# Patient Record
Sex: Female | Born: 1935 | Race: White | Hispanic: No | Smoking: Never smoker
Health system: Southern US, Community
[De-identification: ages and names within clinical notes are randomized; demographics above are authoritative.]

## PROBLEM LIST (undated history)

## (undated) DIAGNOSIS — E785 Hyperlipidemia, unspecified: Secondary | ICD-10-CM

## (undated) DIAGNOSIS — F329 Major depressive disorder, single episode, unspecified: Secondary | ICD-10-CM

## (undated) DIAGNOSIS — C20 Malignant neoplasm of rectum: Secondary | ICD-10-CM

## (undated) DIAGNOSIS — M069 Rheumatoid arthritis, unspecified: Secondary | ICD-10-CM

## (undated) DIAGNOSIS — I2510 Atherosclerotic heart disease of native coronary artery without angina pectoris: Secondary | ICD-10-CM

## (undated) DIAGNOSIS — I340 Nonrheumatic mitral (valve) insufficiency: Secondary | ICD-10-CM

## (undated) DIAGNOSIS — J849 Interstitial pulmonary disease, unspecified: Secondary | ICD-10-CM

## (undated) DIAGNOSIS — I350 Nonrheumatic aortic (valve) stenosis: Secondary | ICD-10-CM

## (undated) DIAGNOSIS — R609 Edema, unspecified: Secondary | ICD-10-CM

## (undated) DIAGNOSIS — H3530 Unspecified macular degeneration: Secondary | ICD-10-CM

## (undated) DIAGNOSIS — D649 Anemia, unspecified: Secondary | ICD-10-CM

## (undated) DIAGNOSIS — I779 Disorder of arteries and arterioles, unspecified: Secondary | ICD-10-CM

## (undated) DIAGNOSIS — Z923 Personal history of irradiation: Secondary | ICD-10-CM

## (undated) DIAGNOSIS — I509 Heart failure, unspecified: Secondary | ICD-10-CM

## (undated) DIAGNOSIS — K625 Hemorrhage of anus and rectum: Secondary | ICD-10-CM

## (undated) DIAGNOSIS — K219 Gastro-esophageal reflux disease without esophagitis: Secondary | ICD-10-CM

## (undated) DIAGNOSIS — I10 Essential (primary) hypertension: Secondary | ICD-10-CM

## (undated) DIAGNOSIS — Z79899 Other long term (current) drug therapy: Secondary | ICD-10-CM

## (undated) DIAGNOSIS — I8290 Acute embolism and thrombosis of unspecified vein: Secondary | ICD-10-CM

## (undated) DIAGNOSIS — F32A Depression, unspecified: Secondary | ICD-10-CM

## (undated) DIAGNOSIS — H269 Unspecified cataract: Secondary | ICD-10-CM

## (undated) DIAGNOSIS — R9430 Abnormal result of cardiovascular function study, unspecified: Secondary | ICD-10-CM

## (undated) DIAGNOSIS — I358 Other nonrheumatic aortic valve disorders: Secondary | ICD-10-CM

## (undated) DIAGNOSIS — S3730XA Unspecified injury of urethra, initial encounter: Secondary | ICD-10-CM

## (undated) DIAGNOSIS — I739 Peripheral vascular disease, unspecified: Secondary | ICD-10-CM

## (undated) DIAGNOSIS — C801 Malignant (primary) neoplasm, unspecified: Secondary | ICD-10-CM

## (undated) DIAGNOSIS — IMO0002 Reserved for concepts with insufficient information to code with codable children: Secondary | ICD-10-CM

## (undated) HISTORY — DX: Depression, unspecified: F32.A

## (undated) HISTORY — DX: Atherosclerotic heart disease of native coronary artery without angina pectoris: I25.10

## (undated) HISTORY — DX: Malignant neoplasm of rectum: C20

## (undated) HISTORY — PX: TONSILLECTOMY: SUR1361

## (undated) HISTORY — DX: Gastro-esophageal reflux disease without esophagitis: K21.9

## (undated) HISTORY — DX: Nonrheumatic aortic (valve) stenosis: I35.0

## (undated) HISTORY — DX: Disorder of arteries and arterioles, unspecified: I77.9

## (undated) HISTORY — DX: Heart failure, unspecified: I50.9

## (undated) HISTORY — DX: Unspecified cataract: H26.9

## (undated) HISTORY — DX: Essential (primary) hypertension: I10

## (undated) HISTORY — DX: Acute embolism and thrombosis of unspecified vein: I82.90

## (undated) HISTORY — DX: Hyperlipidemia, unspecified: E78.5

## (undated) HISTORY — DX: Nonrheumatic mitral (valve) insufficiency: I34.0

## (undated) HISTORY — DX: Other nonrheumatic aortic valve disorders: I35.8

## (undated) HISTORY — DX: Unspecified injury of urethra, initial encounter: S37.30XA

## (undated) HISTORY — PX: APPENDECTOMY: SHX54

## (undated) HISTORY — DX: Other long term (current) drug therapy: Z79.899

## (undated) HISTORY — DX: Reserved for concepts with insufficient information to code with codable children: IMO0002

## (undated) HISTORY — PX: BREAST SURGERY: SHX581

## (undated) HISTORY — DX: Major depressive disorder, single episode, unspecified: F32.9

## (undated) HISTORY — DX: Interstitial pulmonary disease, unspecified: J84.9

## (undated) HISTORY — DX: Rheumatoid arthritis, unspecified: M06.9

## (undated) HISTORY — DX: Abnormal result of cardiovascular function study, unspecified: R94.30

## (undated) HISTORY — DX: Peripheral vascular disease, unspecified: I73.9

## (undated) HISTORY — DX: Edema, unspecified: R60.9

---

## 1997-10-01 ENCOUNTER — Ambulatory Visit (HOSPITAL_COMMUNITY): Admission: RE | Admit: 1997-10-01 | Discharge: 1997-10-01 | Payer: Self-pay

## 1998-10-05 ENCOUNTER — Emergency Department (HOSPITAL_COMMUNITY): Admission: EM | Admit: 1998-10-05 | Discharge: 1998-10-05 | Payer: Self-pay | Admitting: Emergency Medicine

## 1998-10-05 ENCOUNTER — Encounter: Payer: Self-pay | Admitting: Emergency Medicine

## 1999-07-25 ENCOUNTER — Inpatient Hospital Stay (HOSPITAL_COMMUNITY): Admission: EM | Admit: 1999-07-25 | Discharge: 1999-07-30 | Payer: Self-pay | Admitting: Emergency Medicine

## 1999-07-25 ENCOUNTER — Encounter: Payer: Self-pay | Admitting: Emergency Medicine

## 1999-07-26 ENCOUNTER — Encounter: Payer: Self-pay | Admitting: Family Medicine

## 1999-07-28 ENCOUNTER — Encounter: Payer: Self-pay | Admitting: Family Medicine

## 1999-07-30 ENCOUNTER — Encounter: Payer: Self-pay | Admitting: Family Medicine

## 1999-08-04 ENCOUNTER — Encounter: Admission: RE | Admit: 1999-08-04 | Discharge: 1999-08-04 | Payer: Self-pay | Admitting: Family Medicine

## 2001-02-08 HISTORY — PX: CORONARY ANGIOPLASTY WITH STENT PLACEMENT: SHX49

## 2001-04-13 ENCOUNTER — Inpatient Hospital Stay (HOSPITAL_COMMUNITY): Admission: EM | Admit: 2001-04-13 | Discharge: 2001-04-24 | Payer: Self-pay | Admitting: *Deleted

## 2001-04-13 ENCOUNTER — Encounter: Payer: Self-pay | Admitting: *Deleted

## 2001-04-14 ENCOUNTER — Encounter: Payer: Self-pay | Admitting: Internal Medicine

## 2001-04-16 ENCOUNTER — Encounter: Payer: Self-pay | Admitting: Internal Medicine

## 2001-07-19 ENCOUNTER — Encounter: Payer: Self-pay | Admitting: Rheumatology

## 2001-07-19 ENCOUNTER — Encounter: Admission: RE | Admit: 2001-07-19 | Discharge: 2001-07-19 | Payer: Self-pay | Admitting: Rheumatology

## 2001-09-10 ENCOUNTER — Encounter (INDEPENDENT_AMBULATORY_CARE_PROVIDER_SITE_OTHER): Payer: Self-pay | Admitting: Specialist

## 2001-09-10 ENCOUNTER — Encounter: Payer: Self-pay | Admitting: Emergency Medicine

## 2001-09-11 ENCOUNTER — Inpatient Hospital Stay (HOSPITAL_COMMUNITY): Admission: EM | Admit: 2001-09-11 | Discharge: 2001-09-14 | Payer: Self-pay | Admitting: Emergency Medicine

## 2001-09-11 ENCOUNTER — Encounter: Payer: Self-pay | Admitting: Internal Medicine

## 2001-09-11 ENCOUNTER — Encounter: Payer: Self-pay | Admitting: Emergency Medicine

## 2002-12-10 ENCOUNTER — Encounter: Admission: RE | Admit: 2002-12-10 | Discharge: 2002-12-10 | Payer: Self-pay | Admitting: Rheumatology

## 2004-02-04 ENCOUNTER — Inpatient Hospital Stay (HOSPITAL_COMMUNITY): Admission: EM | Admit: 2004-02-04 | Discharge: 2004-02-07 | Payer: Self-pay | Admitting: Emergency Medicine

## 2004-02-04 ENCOUNTER — Ambulatory Visit: Payer: Self-pay | Admitting: Family Medicine

## 2004-07-24 ENCOUNTER — Ambulatory Visit: Payer: Self-pay | Admitting: Cardiology

## 2004-10-05 ENCOUNTER — Ambulatory Visit: Payer: Self-pay | Admitting: Cardiology

## 2005-04-26 ENCOUNTER — Ambulatory Visit: Payer: Self-pay | Admitting: Cardiology

## 2005-10-05 ENCOUNTER — Ambulatory Visit: Payer: Self-pay | Admitting: Cardiology

## 2006-01-19 ENCOUNTER — Ambulatory Visit: Payer: Self-pay | Admitting: Cardiology

## 2006-01-19 LAB — CONVERTED CEMR LAB
ALT: 8 units/L (ref 0–40)
AST: 18 units/L (ref 0–37)
Albumin: 3.8 g/dL (ref 3.5–5.2)
Alkaline Phosphatase: 54 units/L (ref 39–117)
Bilirubin, Direct: 0.1 mg/dL (ref 0.0–0.3)
Chol/HDL Ratio, serum: 3.1
Cholesterol: 138 mg/dL (ref 0–200)
HDL: 44.5 mg/dL (ref >39.0)
LDL Cholesterol: 70 mg/dL (ref 0–99)
Total Bilirubin: 0.6 mg/dL (ref 0.3–1.2)
Total Protein: 6.3 g/dL (ref 6.0–8.3)
Triglyceride fasting, serum: 116 mg/dL (ref 0–149)
VLDL: 23 mg/dL (ref 0–40)

## 2006-10-03 ENCOUNTER — Ambulatory Visit: Payer: Self-pay | Admitting: Cardiology

## 2006-10-03 LAB — CONVERTED CEMR LAB
ALT: 5 units/L (ref 0–35)
AST: 17 units/L (ref 0–37)
Albumin: 3.7 g/dL (ref 3.5–5.2)
Alkaline Phosphatase: 67 units/L (ref 39–117)
Bilirubin, Direct: 0.1 mg/dL (ref 0.0–0.3)
Cholesterol: 145 mg/dL (ref 0–200)
HDL: 36.6 mg/dL — ABNORMAL LOW (ref >39.0)
LDL Cholesterol: 81 mg/dL (ref 0–99)
Total Bilirubin: 0.7 mg/dL (ref 0.3–1.2)
Total CHOL/HDL Ratio: 4
Total Protein: 6.1 g/dL (ref 6.0–8.3)
Triglycerides: 138 mg/dL (ref 0–149)
VLDL: 28 mg/dL (ref 0–40)

## 2006-10-12 ENCOUNTER — Ambulatory Visit: Payer: Self-pay | Admitting: Cardiology

## 2007-11-01 ENCOUNTER — Ambulatory Visit: Payer: Self-pay | Admitting: Cardiology

## 2007-11-22 ENCOUNTER — Encounter: Payer: Self-pay | Admitting: Cardiology

## 2007-11-22 ENCOUNTER — Ambulatory Visit: Payer: Self-pay | Admitting: Cardiology

## 2007-11-22 ENCOUNTER — Ambulatory Visit: Payer: Self-pay

## 2008-08-28 ENCOUNTER — Telehealth: Payer: Self-pay | Admitting: Cardiology

## 2008-10-17 ENCOUNTER — Ambulatory Visit: Payer: Self-pay | Admitting: Cardiology

## 2008-10-26 ENCOUNTER — Encounter: Payer: Self-pay | Admitting: Cardiology

## 2008-10-26 DIAGNOSIS — I10 Essential (primary) hypertension: Secondary | ICD-10-CM | POA: Insufficient documentation

## 2008-10-28 ENCOUNTER — Ambulatory Visit: Payer: Self-pay | Admitting: Cardiology

## 2008-10-28 LAB — CONVERTED CEMR LAB
ALT: 8 units/L (ref 0–35)
AST: 16 units/L (ref 0–37)
Albumin: 3.8 g/dL (ref 3.5–5.2)
Alkaline Phosphatase: 56 units/L (ref 39–117)
Bilirubin, Direct: 0.1 mg/dL (ref 0.0–0.3)
Cholesterol: 159 mg/dL (ref 0–200)
HDL: 62.3 mg/dL (ref >39.00)
LDL Cholesterol: 81 mg/dL (ref 0–99)
Total Bilirubin: 0.8 mg/dL (ref 0.3–1.2)
Total CHOL/HDL Ratio: 3
Total Protein: 6.4 g/dL (ref 6.0–8.3)
Triglycerides: 80 mg/dL (ref 0.0–149.0)
VLDL: 16 mg/dL (ref 0.0–40.0)

## 2008-11-04 ENCOUNTER — Encounter: Payer: Self-pay | Admitting: Cardiology

## 2009-03-16 ENCOUNTER — Emergency Department (HOSPITAL_COMMUNITY): Admission: EM | Admit: 2009-03-16 | Discharge: 2009-03-16 | Payer: Self-pay | Admitting: Emergency Medicine

## 2009-11-03 ENCOUNTER — Telehealth: Payer: Self-pay | Admitting: Cardiology

## 2009-11-17 ENCOUNTER — Ambulatory Visit: Payer: Self-pay | Admitting: Cardiology

## 2009-11-20 ENCOUNTER — Encounter: Payer: Self-pay | Admitting: Cardiology

## 2009-11-20 ENCOUNTER — Ambulatory Visit: Payer: Self-pay | Admitting: Cardiology

## 2009-11-20 LAB — CONVERTED CEMR LAB
ALT: 6 units/L (ref 0–35)
AST: 20 units/L (ref 0–37)
Albumin: 3.5 g/dL (ref 3.5–5.2)
Alkaline Phosphatase: 64 units/L (ref 39–117)
Bilirubin, Direct: 0.1 mg/dL (ref 0.0–0.3)
Cholesterol: 209 mg/dL — ABNORMAL HIGH (ref 0–200)
Direct LDL: 126.6 mg/dL
HDL: 41.7 mg/dL (ref >39.00)
Total Bilirubin: 0.4 mg/dL (ref 0.3–1.2)
Total CHOL/HDL Ratio: 5
Total Protein: 5.9 g/dL — ABNORMAL LOW (ref 6.0–8.3)
Triglycerides: 184 mg/dL — ABNORMAL HIGH (ref 0.0–149.0)
VLDL: 36.8 mg/dL (ref 0.0–40.0)

## 2009-12-24 ENCOUNTER — Ambulatory Visit: Payer: Self-pay

## 2009-12-24 ENCOUNTER — Ambulatory Visit: Payer: Self-pay | Admitting: Cardiology

## 2009-12-31 LAB — CONVERTED CEMR LAB
ALT: 10 units/L (ref 0–35)
AST: 23 units/L (ref 0–37)
Albumin: 3.9 g/dL (ref 3.5–5.2)
Alkaline Phosphatase: 63 units/L (ref 39–117)
Bilirubin, Direct: 0.1 mg/dL (ref 0.0–0.3)
Cholesterol: 166 mg/dL (ref 0–200)
HDL: 45 mg/dL (ref >39.00)
LDL Cholesterol: 95 mg/dL (ref 0–99)
Total Bilirubin: 0.4 mg/dL (ref 0.3–1.2)
Total CHOL/HDL Ratio: 4
Total Protein: 6.1 g/dL (ref 6.0–8.3)
Triglycerides: 130 mg/dL (ref 0.0–149.0)
VLDL: 26 mg/dL (ref 0.0–40.0)

## 2010-03-12 NOTE — Assessment & Plan Note (Signed)
Summary: rov. gd      Allergies Added: NKDA  Visit Type:  Follow-up Primary Carman Essick:  none  CC:  CAD.  History of Present Illness: The patient is seen for followup of coronary artery disease and dyslipidemia and aortic valve sclerosis..  I saw her last September, 2010.  She has disfiguring rheumatoid arthritis.  At this time she does not have a primary physician.  I am strongly encouraging her to try to find one.  She had her lipids checked recently.  Her LDL is 126.  She admits to me today that she has not been taking her Lipitor.  She's not having any significant chest pain or shortness of breath.  Current Medications (verified): 1)  Metoprolol Succinate 100 Mg Xr24h-Tab (Metoprolol Succinate) .... Take One Tablet By Mouth Daily 2)  Lipitor 10 Mg Tabs (Atorvastatin Calcium) .... Take One Tablet By Mouth Daily. 3)  Amitriptyline Hcl 50 Mg Tabs (Amitriptyline Hcl) .... Take One Tablet By Mouth Once Daily. 4)  Nexium 40 Mg Cpdr (Esomeprazole Magnesium) .... Take One Tablet By Mouth Once Daily. 5)  Arava 20 Mg Tabs (Leflunomide) .... Take One Tablet By Mouth Once Daily. 6)  Prednisone 5 Mg Tabs (Prednisone) .... Take One Tablet By Mouth Once Daily. 7)  Tylenol Extra Strength 500 Mg Tabs (Acetaminophen) .... As Needed 8)  Aspirin Ec 325 Mg Tbec (Aspirin) .... Take One Tablet By Mouth Daily 9)  Calcium Carbonate-Vitamin D 600-400 Mg-Unit  Tabs (Calcium Carbonate-Vitamin D) .... Take One Tablet By Mouth Once Daily. 10)  Eye-Vites  Tabs (Multiple Vitamins-Minerals) .... Take One Tablet By Mouth Once Daily.  Allergies (verified): No Known Drug Allergies  Past History:  Past Medical History: CAD... two-vessel intervention 2003 Dyslipidemia LV function.... normal..EF 60%... echo..October 2009 CHF.... single episode... in the past Mitral regurgitation mild..mild prolapse anterior and posterior leaflets.. echo.. October 2009 Aortic valve sclerosis.... mild...moderate calcification.. echo..  October 2009 Apical clot... question in the past... no longer an issue Hypertension... difficult to obtain blood pressure at times Urethral trauma.... some bleeding in hospital Rheumatoid arthritis.... severe... deforming Lung disease..... interstitial.... related to rheumatoid arthritis.[name] Kitchen/  also question left apical nodule followed elsewhere..... my understanding stabilized Prednisone therapy Carotid bruit  right.... October, 2011  Review of Systems       Patient denies fever, chills, headache, sweats, rash, change in vision, change in hearing, chest pain, cough, nausea vomiting, urinary symptoms.  She does admit to some indigestion.  She says that she was seen in the emergency room and that she has a significant hiatal hernia.  All other systems are reviewed and are negative.  Vital Signs:  Patient profile:   77 year old female Height:      64 inches Weight:      114 pounds BMI:     19.64 Pulse rate:   90 / minute BP sitting:   102 / 77  (right arm) Cuff size:   regular  Vitals Entered By: Hardin Negus, RMA (November 20, 2009 10:00 AM)  Physical Exam  General:  The patient is frail but energetic. Head:  head is atraumatic. Eyes:  no xanthelasma. Neck:  no jugular venous distention.  There is a sound in her right neck which could be either a bruit or a radiated murmur. Chest Wall:  no chest wall tenderness. Lungs:  lungs are clear.  Respiratory effort is nonlabored. Heart:  cardiac exam reveals S1-S2.  There is a crescendo decrescendo systolic murmur. Abdomen:  abdomen is soft. Msk:  the patient has severe deforming rheumatoid arthritis affecting her hands. Extremities:  no peripheral edema. Skin:  no skin rashes. Psych:  patient is oriented to person time and place.  Affect is normal.   Impression & Recommendations:  Problem # 1:  CAROTID BRUIT (ICD-785.9)  The patient has a right carotid bruit.  It is possible that this could be a radiated murmur.  We need carotid  Doppler to be sure this will be arranged and I'll be in touch with her.  Orders: Carotid Duplex (Carotid Duplex)  Problem # 2:  RHEUMATOID ARTHRITIS (ICD-714.0) The patient's rheumatoid arthritis is severe.  She is not having a lot of pain at this time.  Problem # 3:  HYPERTENSION (ICD-401.9)  Her updated medication list for this problem includes:    Metoprolol Succinate 100 Mg Xr24h-tab (Metoprolol succinate) .[name] Kitchen... Take one tablet by mouth daily    Aspirin Ec 325 Mg Tbec (Aspirin) .[name] Kitchen... Take one tablet by mouth daily Blood pressure is controlled.  No change in therapy.  Problem # 4:  DYSLIPIDEMIA (ICD-272.4)  Her updated medication list for this problem includes:    Lipitor 10 Mg Tabs (Atorvastatin calcium) .[name] Kitchen... Take one tablet by mouth daily. The patient's lipids are not adequately treated.  She admitted today that she has not been taking her Lipitor.  She will restart it and we'll arrange for followup labs.  Problem # 5:  CAD (ICD-414.00)  Her updated medication list for this problem includes:    Metoprolol Succinate 100 Mg Xr24h-tab (Metoprolol succinate) .[name] Kitchen... Take one tablet by mouth daily    Aspirin Ec 325 Mg Tbec (Aspirin) .[name] Kitchen... Take one tablet by mouth daily  Orders: EKG w/ Interpretation (93000) Coronary disease is stable.  EKG is done today and reviewed by me.  There is normal sinus rhythm with nonspecific ST-T wave changes.  No further workup is needed.  Patient Instructions: 1)  Take your Lipitor every day 2)  Your physician has requested that you have a carotid duplex. This test is an ultrasound of the carotid arteries in your neck. It looks at blood flow through these arteries that supply the brain with blood. Allow one hour for this exam. There are no restrictions or special instructions. 3)  Your physician recommends that you return for a FASTING lipid and liver profile: in 6 weeks (272.2) 4)  When you get a primary care MD, let us know so we can send them your  records 5)  Your physician wants you to follow-up in:  1 year.  You will receive a reminder letter in the mail two months in advance. If you don't receive a letter, please call our office to schedule the follow-up appointment. Prescriptions: LIPITOR 10 MG TABS (ATORVASTATIN CALCIUM) Take one tablet by mouth daily.  #90 x 3   Entered by:   Meredith Staggers, RN   Authorized by:   Talitha Givens, MD, [facility]   Signed by:   Meredith Staggers, RN on 11/20/2009   Method used:   Electronically to        [facility]* (retail)       [address] [place], [place]  [address]       Ph: [phone] or [phone]       Fax: [phone]   RxID:   [id number] NEXIUM 40 MG CPDR (ESOMEPRAZOLE MAGNESIUM) Take one tablet by mouth once  daily.  #30 x 3   Entered by:   Meredith Staggers, RN   Authorized by:   Talitha Givens, MD, [facility]   Signed by:   Meredith Staggers, RN on 11/20/2009   Method used:   Electronically to        [facility]* (retail)       [address] [place], [place]  [address]       Ph: [phone] or [phone]       Fax: [phone]   RxID:   [id number] AMITRIPTYLINE HCL 50 MG TABS (AMITRIPTYLINE HCL) Take one tablet by mouth once daily.  #90 x 3   Entered by:   Meredith Staggers, RN   Authorized by:   Talitha Givens, MD, [facility]   Signed by:   Meredith Staggers, RN on 11/20/2009   Method used:   Electronically to        [facility]* (retail)       [address] [place], [place]  [address]       Ph: [phone] or [phone]       Fax: [phone]   RxID:   [id number] METOPROLOL SUCCINATE 100 MG XR24H-TAB (METOPROLOL SUCCINATE) Take one tablet by mouth daily  #90 x 3   Entered by:   Meredith Staggers, RN   Authorized by:   Talitha Givens, MD, [facility]   Signed by:   Meredith Staggers, RN on  11/20/2009   Method used:   Electronically to        [facility]* (retail)       [address] [place], [place]  [address]       Ph: [phone] or [phone]       Fax: [phone]   RxID:   [id number]

## 2010-03-12 NOTE — Progress Notes (Signed)
Summary: lab work prior to appt   Phone Note Call from Patient Call back at [facility] Phone [phone]   Caller: Patient Reason for Call: Talk to Nurse, Lab or Test Results Summary of Call: would like to have lab work prior to appt  Initial call taken by: Lorne Skeens,  November 03, 2009 12:20 PM  Follow-up for Phone Call        labs sch for 10/10 Meredith Staggers, RN  November 03, 2009 5:04 PM

## 2010-03-23 ENCOUNTER — Encounter: Payer: Self-pay | Admitting: Cardiology

## 2010-04-01 NOTE — Letter (Signed)
Summary: Lipid reminder   [facility] Main [facility]  [address]   [place], [place] [address]   Phone: [phone]  Fax: [phone]        March 23, 2010 MRN: [id number]    [name] [address] [place], [place]  [address]    Dear Ms. Massoud,  Our records indicate it is time to check your cholesterol.  Please call our office to schedule an appt for labwork.  Please remember it is a fasting lab.     Sincerely,  Meredith Staggers, RN Willa Rough, MD This letter has been electronically signed by your physician.

## 2010-04-06 ENCOUNTER — Other Ambulatory Visit (INDEPENDENT_AMBULATORY_CARE_PROVIDER_SITE_OTHER): Payer: Medicare Other

## 2010-04-06 ENCOUNTER — Other Ambulatory Visit: Payer: Self-pay | Admitting: Cardiology

## 2010-04-06 ENCOUNTER — Encounter: Payer: Self-pay | Admitting: Cardiology

## 2010-04-06 DIAGNOSIS — E785 Hyperlipidemia, unspecified: Secondary | ICD-10-CM

## 2010-04-06 LAB — HEPATIC FUNCTION PANEL
ALT: 10 U/L (ref 0–35)
AST: 24 U/L (ref 0–37)
Albumin: 3.8 g/dL (ref 3.5–5.2)
Alkaline Phosphatase: 54 U/L (ref 39–117)
Bilirubin, Direct: 0.1 mg/dL (ref 0.0–0.3)
Total Bilirubin: 0.5 mg/dL (ref 0.3–1.2)
Total Protein: 6.2 g/dL (ref 6.0–8.3)

## 2010-04-06 LAB — LIPID PANEL
Cholesterol: 140 mg/dL (ref 0–200)
HDL: 43.8 mg/dL (ref >39.00)
LDL Cholesterol: 69 mg/dL (ref 0–99)
Total CHOL/HDL Ratio: 3
Triglycerides: 134 mg/dL (ref 0.0–149.0)
VLDL: 26.8 mg/dL (ref 0.0–40.0)

## 2010-04-08 ENCOUNTER — Encounter: Payer: Self-pay | Admitting: Cardiology

## 2010-04-16 NOTE — Letter (Signed)
Summary: Custom - Lipid  [facility], Main Office  [address]   [place], [place] [address]   Phone: [phone]  Fax: [phone]     April 08, 2010 MRN: [id number]   BELLAMIE TURNEY [address] [place], [place]  [address]   Dear Ms. Haldeman,  We have reviewed your cholesterol results.  They are as follows:     Total Cholesterol:    140 (Desirable: less than 200)       HDL  Cholesterol:     43.80  (Desirable: greater than 40 for men and 50 for women)       LDL Cholesterol:       69  (Desirable: less than 100 for low risk and less than 70 for moderate to high risk)       Triglycerides:       134.0  (Desirable: less than 150)  Our recommendations include:  Looks Good, continue Lipitor   Call our office at the number listed above if you have any questions.  Lowering your LDL cholesterol is important, but it is only one of a large number of "risk factors" that may indicate that you are at risk for heart disease, stroke or other complications of hardening of the arteries.  Other risk factors include:   A.  Cigarette Smoking* B.  High Blood Pressure* C.  Obesity* D.   Low HDL Cholesterol (see yours above)* E.   Diabetes Mellitus (higher risk if your is uncontrolled) F.  Family history of premature heart disease G.  Previous history of stroke or cardiovascular disease    *These are risk factors YOU HAVE CONTROL OVER.  For more information, visit .  There is now evidence that lowering the TOTAL CHOLESTEROL AND LDL CHOLESTEROL can reduce the risk of heart disease.  The American Heart Association recommends the following guidelines for the treatment of elevated cholesterol:  1.  If there is now current heart disease and less than two risk factors, TOTAL CHOLESTEROL should be less than 200 and LDL CHOLESTEROL should be less than 100. 2.  If there is current heart disease or two or more risk factors, TOTAL CHOLESTEROL should be less than 200 and LDL  CHOLESTEROL should be less than 70.  A diet low in cholesterol, saturated fat, and calories is the cornerstone of treatment for elevated cholesterol.  Cessation of smoking and exercise are also important in the management of elevated cholesterol and preventing vascular disease.  Studies have shown that 30 to 60 minutes of physical activity most days can help lower blood pressure, lower cholesterol, and keep your weight at a healthy level.  Drug therapy is used when cholesterol levels do not respond to therapeutic lifestyle changes (smoking cessation, diet, and exercise) and remains unacceptably high.  If medication is started, it is important to have you levels checked periodically to evaluate the need for further treatment options.  Thank you,    [employer] Team

## 2010-04-29 LAB — URINALYSIS, ROUTINE W REFLEX MICROSCOPIC
Glucose, UA: NEGATIVE mg/dL
Hgb urine dipstick: NEGATIVE
Ketones, ur: 15 mg/dL — AB
Nitrite: NEGATIVE
Protein, ur: 100 mg/dL — AB
Specific Gravity, Urine: 1.031 — ABNORMAL HIGH (ref 1.005–1.030)
Urobilinogen, UA: 0.2 mg/dL (ref 0.0–1.0)
pH: 5 (ref 5.0–8.0)

## 2010-04-29 LAB — HEPATIC FUNCTION PANEL
ALT: <8 U/L (ref 0–35)
AST: 21 U/L (ref 0–37)
Albumin: 3 g/dL — ABNORMAL LOW (ref 3.5–5.2)
Alkaline Phosphatase: 64 U/L (ref 39–117)
Bilirubin, Direct: 0.2 mg/dL (ref 0.0–0.3)
Indirect Bilirubin: 0.1 mg/dL — ABNORMAL LOW (ref 0.3–0.9)
Total Bilirubin: 0.3 mg/dL (ref 0.3–1.2)
Total Protein: 5.4 g/dL — ABNORMAL LOW (ref 6.0–8.3)

## 2010-04-29 LAB — BASIC METABOLIC PANEL
BUN: 15 mg/dL (ref 6–23)
CO2: 24 mEq/L (ref 19–32)
Calcium: 7.8 mg/dL — ABNORMAL LOW (ref 8.4–10.5)
Chloride: 105 mEq/L (ref 96–112)
Creatinine, Ser: 0.64 mg/dL (ref 0.4–1.2)
GFR calc Af Amer: >60 mL/min (ref >60)
GFR calc non Af Amer: >60 mL/min (ref >60)
Glucose, Bld: 107 mg/dL — ABNORMAL HIGH (ref 70–99)
Potassium: 3.2 mEq/L — ABNORMAL LOW (ref 3.5–5.1)
Sodium: 138 mEq/L (ref 135–145)

## 2010-04-29 LAB — CBC
HCT: 30.7 % — ABNORMAL LOW (ref 36.0–46.0)
Hemoglobin: 10.3 g/dL — ABNORMAL LOW (ref 12.0–15.0)
MCHC: 33.6 g/dL (ref 30.0–36.0)
MCV: 98.6 fL (ref 78.0–100.0)
Platelets: 148 10*3/uL — ABNORMAL LOW (ref 150–400)
RBC: 3.11 MIL/uL — ABNORMAL LOW (ref 3.87–5.11)
RDW: 13.6 % (ref 11.5–15.5)
WBC: 10.6 10*3/uL — ABNORMAL HIGH (ref 4.0–10.5)

## 2010-04-29 LAB — DIFFERENTIAL
Basophils Absolute: 0 10*3/uL (ref 0.0–0.1)
Basophils Relative: 0 % (ref 0–1)
Eosinophils Absolute: 0.2 10*3/uL (ref 0.0–0.7)
Eosinophils Relative: 2 % (ref 0–5)
Lymphocytes Relative: 13 % (ref 12–46)
Lymphs Abs: 1.3 10*3/uL (ref 0.7–4.0)
Monocytes Absolute: 0.7 10*3/uL (ref 0.1–1.0)
Monocytes Relative: 7 % (ref 3–12)
Neutro Abs: 8.3 10*3/uL — ABNORMAL HIGH (ref 1.7–7.7)
Neutrophils Relative %: 78 % — ABNORMAL HIGH (ref 43–77)

## 2010-04-29 LAB — URINE CULTURE
Colony Count: NO GROWTH
Culture: NO GROWTH

## 2010-04-29 LAB — URINE MICROSCOPIC-ADD ON

## 2010-04-29 LAB — POCT CARDIAC MARKERS
CKMB, poc: 1.6 ng/mL (ref 1.0–8.0)
Myoglobin, poc: 327 ng/mL (ref 12–200)

## 2010-04-29 LAB — LACTIC ACID, PLASMA: Lactic Acid, Venous: 1.5 mmol/L (ref 0.5–2.2)

## 2010-06-23 NOTE — Assessment & Plan Note (Signed)
[facility]                            CARDIOLOGY OFFICE NOTE   [name], [name]                     MRN:          [id number]  DATE:11/22/2007                            DOB:          Nov 12, 1925    [name] is here for followup of her chest discomfort and coronary  artery disease.  I saw her last on November 01, 2007.  We decided to  proceed with a 2-D echo.  I have personally reviewed it this morning.  She has normal LV function.  She has mild aortic stenosis.  There is no  marked mitral regurgitation.  I will await the formal report.  Overall,  it looks good.  The patient has not had any significant recurring chest  discomfort since she was here last.  She is doing well.   PAST MEDICAL HISTORY:   ALLERGIES:  No known drug allergies.   MEDICATIONS:  Toprol, Lipitor, amitriptyline, Nexium, prednisone,  calcium, gold shot, and Tylenol.   OTHER MEDICAL PROBLEMS:  See the list on my note of November 01, 2007.   REVIEW OF SYSTEMS:  She has pain from her arthritis.  She is not having  any GI or GU symptoms.  She has no fevers or chills.  Her review of  systems otherwise is negative.   PHYSICAL EXAMINATION:  VITAL SIGNS:  Blood pressure today 104/78 with a  pulse of 60.  GENERAL:  The patient is oriented to person, time and place.  Affect is  normal.  HEENT:  No xanthelasma.  She has normal extraocular motion.  NECK:  There are no carotid bruits.  There is no jugular venous  distention.  LUNGS:  Clear.  Respiratory effort is not labored.  CARDIAC:  An S1 with an S2.  There are no clicks.  There is a soft  systolic murmur.  ABDOMEN:  Soft.  EXTREMITIES:  She has no peripheral edema.  She has classic findings of  deforming rheumatoid arthritis.   Problems are listed in my note of November 01, 2007.  She does have  known coronary artery disease.  I believe she is stable.  She has good  LV function.  I have discussed the possibility of  proceeding with exercise testing, but I feel that is not necessary.  She  will stay on her same meds.  I will see her back in 6 months.     Luis Abed, MD, [facility]  Electronically Signed    JDK/MedQ  DD: 11/22/2007  DT: 11/23/2007  Job #: [id number]   cc:   Aundra Dubin, M.D.  Lianne Bushy, M.D.

## 2010-06-23 NOTE — Assessment & Plan Note (Signed)
[facility]                            CARDIOLOGY OFFICE NOTE   [name], [name]                     MRN:          [id number]  DATE:11/01/2007                            DOB:          07/29/1925    I saw [name] last in August 2007.  She does have coronary disease.  She has been stable.  She has slight chest discomfort at nighttime in  bed.  She says that this is relieved by sitting up and drinking some  water.  She does not have exertional symptoms.  Her ability to ambulate  is limited by her severe deforming rheumatoid arthritis.   She also has a history of some valvular disease.   She has not had syncope or presyncope.   PAST MEDICAL HISTORY:   ALLERGIES:  No known drug allergies.   MEDICATIONS:  1. Toprol XL 100.  2. Lipitor 10.  3. Amitriptyline 50.  4. Nexium 40.  5. Prednisone 5 b.i.d.  6. Calcium gold shot once a week.  7. Excedrin.   OTHER MEDICAL PROBLEMS:  See the complete list below.   REVIEW OF SYSTEMS:  Today, other than the HPI, her review of systems is  negative.   PHYSICAL EXAMINATION:  Weight is 121 pounds.  Pulse is 80.  Her blood  pressure is difficult to percuss.  Her pulse is palpable at the wrist  and using the cuff, her systolic palpable pulse is 112.  The patient is  oriented to person, time, and place.  Affect is normal.  HEENT reveals  no xanthelasma.  She has normal extraocular motion.  Her skin is pale.  She has no jugular venous distention.  There are no carotid bruits.  Lungs are clear.  Respiratory effort is not labored.  Cardiac exam  reveals an S1 with an S2.  There is a 3/6 systolic crescendo decrescendo  murmur.  Her abdomen is soft.  She has no peripheral edema.  She has  severe deforming rheumatoid arthritis of both the hands and the feet.   EKG reveals nonspecific ST-T wave changes.   Problems include,  1. History of coronary disease with good left ventricular function.      She has had  vague chest pain.  I will consider a Myoview scan at a      later date.  I am not convinced that this is ischemia at this time.  2. History of some left ventricular dysfunction in the past that      improved, it is time for a followup 2-D echo.  3. Status post 2-vessel intervention in 2003.  4. Hypertension.  This is treated.  Her blood pressure is in fact      difficult to obtain, but stable.  5. History of urethral trauma with some bleeding while in the      hospital.  6. Severe deforming rheumatoid arthritis.  7. Interstitial lung disease due to rheumatoid arthritis.  8. One episode of chronic heart failure possibly due to ischemia in      the past and this resolved.  9. Mild mitral regurgitation.  She is to have a followup echo.  10.Question of an apical clot in the past that was no longer an issue      over time.  11.Question of a left apical nodule in her lungs that is followed      elsewhere, it is my understanding this stabilized.  12.Ongoing prednisone therapy.  13.History of mild aortic sclerosis.   The patient needs a followup 2-D echo.  This will be done at the time of  visit, so that I can assess her LV and valve, but also talked to her  again about chest pain to be sure that we do not need to proceed with  further evaluation.     Luis Abed, MD, [facility]  Electronically Signed    JDK/MedQ  DD: 11/01/2007  DT: 11/02/2007  Job #: [id number]   cc:   Lianne Bushy, M.D.  Aundra Dubin, M.D.

## 2010-06-23 NOTE — Assessment & Plan Note (Signed)
[facility]                            CARDIOLOGY OFFICE NOTE   LAGRETTA, LOSEKE                     MRN:          [id number]  DATE:10/12/2006                            DOB:          December 05, 1925    Ms. [name] is seen for cardiology followup. She does have significant  coronary disease. This has been stable. She is not having any  significant chest pain. She has no significant shortness of breath. She  has had no syncope or pre-syncope. She has significant problems from her  rheumatoid arthritis that is followed by Dr.  Kellie Simmering.   PAST MEDICAL HISTORY:   ALLERGIES:  No known drug allergies.   MEDICATIONS:  1. Toprol XL 100.  2. Lipitor 10.  3. Amitriptyline 50.  4. Nexium 40 b.i.d.  5. Prednisone 5 b.i.d.  6. Calcium.  7. Aspirin 81.  8. Gold shot once weekly.  9. Excedrin as needed.   OTHER MEDICAL PROBLEMS:  See the list below.   REVIEW OF SYSTEMS:  Her major problem is the discomfort from her  rheumatoid arthritis. She has had some mild difficulty with her eyes.  Otherwise, her review of systems is negative.   PHYSICAL EXAMINATION:  Blood pressure today reveals that her systolic is  slightly elevated but generally that is not the case. She will need  blood pressure followup. Pressure is 149/78 with a pulse of 83 and her  weight is 124 pounds which is a normal range for her. The patient is  oriented to person, time and place. Affect is normal. The patient has  severe deforming rheumatoid arthritis. She appears to be pale, but I  believe that this is her baseline skin color.  HEENT: Reveals no xanthelasma. There is normal extraocular motion. There  are no carotid bruits. There is no jugular venous distention.  LUNGS:  Are clear. Respiratory effort is not labored.  CARDIAC: Reveals an S1, with an S2. There are no clicks or significant  murmurs.  ABDOMEN: Soft. There are no masses or bruits.  She has no significant peripheral edema.  She has 1+ distal pulses. She  has severe deformities of her hands and feet compatible with rheumatoid  arthritis.   EKG reveals sinus rhythm with nonspecific ST-T wave changes with diffuse  ST flattening. This has been seen before and there is no change.   PROBLEM LIST:  1. Coronary disease with good left ventricular function. In the past      she had some left ventricular dysfunction, but it improved. The      patient needs aggressive secondary prevention. Her most recent LDL      was 81. This was in the setting of her missing some of her Lipitor.      Therefore, I encouraged her to take her Lipitor everyday and there      will be no change in the dose.  2. Status post two vessel intervention in 2003.  3. Hypertension. In general, this has been treated. Her pressure is up      slightly today and there will need to be followup.  4.  Severe deforming rheumatoid arthritis.  5. Interstitial lung disease related to rheumatoid arthritis.  6. One episode of congestive heart failure in the past possibly due to      ischemia at the time and this has resolved.  7. Mild mitral regurgitation by history. I have decided that she does      not need an echo at this time. We will strongly consider this next      year.  8. Question of an apical clot in the past, but this is no longer an      issue.  9. Question of a left apical nodule in her lungs. I have not been      involved in this, but it is my understanding that this is stable.  10.Ongoing prednisone therapy for her rheumatoid arthritis.   The patient's cardiac status is stable. She has difficulty traveling to  get to all of her doctors. We will rewrite as many of her prescriptions  as we can to help her today.     Luis Abed, MD, [facility]  Electronically Signed    JDK/MedQ  DD: 10/12/2006  DT: 10/12/2006  Job #: [id number]   cc:   Lianne Bushy, M.D.  Aundra Dubin, M.D.

## 2010-06-26 NOTE — Assessment & Plan Note (Signed)
[facility]                              CARDIOLOGY OFFICE NOTE   REMIE, MATHISON                       MRN:          [id number]  DATE:  10/05/2005                              DOB:      17-Feb-1925    HISTORY OF PRESENT ILLNESS:  Ms. Beazley is stable.  She does have coronary  disease.  She is not having any significant chest pain.  She does have  significant problems from her rheumatoid arthritis but she manages.   PAST MEDICAL HISTORY:   ALLERGIES:  No known drug allergies.   MEDICATIONS:  1. Toprol XL 100.  2. Lipitor 10.  3. Amitriptyline 50.  4. Nexium 40 b.i.d.  5. Prednisone 5 b.i.d.  6. Calcium.  7. Aspirin 81.   OTHER MEDICAL PROBLEMS:  See the list below.   REVIEW OF SYSTEMS:  The patient is limited by her rheumatoid arthritis.  Otherwise her review of systems is negative.   PHYSICAL EXAMINATION:  VITAL SIGNS:  Blood pressure today is 130/76 with a  pulse of 82.  GENERAL:  The patient is oriented to person, time, and place, and her affect  is normal.  SKIN:  Her skin is fair.  HEENT:  Reveals no xanthelasma.  She has normal extraocular motion.  NECK:  There are no carotid bruits.  There is no jugular venous distention.  CARDIAC:  Reveals an S1 with an S2.  There are no clicks or significant  murmurs.  ABDOMEN:  Soft.  There are no masses or bruits.  EXTREMITIES:  There is no peripheral edema.  The patient has classic changes  of rheumatoid arthritis in her hands with deviation of her fingers.   LABORATORY DATA:  EKG reveals no significant change.   PROBLEMS:  1. Coronary disease with good LV function.  At one time she had some LV      dysfunction that improved.  2. Status post two vessel intervention in 2003, well-treated, no chest      pain.  3. Hypertension, treated.  4. History of urethral trauma with some bleeding in the hospital in the      past.  5. Severe deforming rheumatoid arthritis.  6. Interstitial lung  disease due to rheumatoid arthritis.  7. One episode of congestive heart failure in the past, possibly due to      ischemia at that time and this is resolved.  8. Mild mitral regurgitation by history.  Her last echo was in 2004.  We      can consider a follow-up next year.  9. Question of an apical clot in the past and this is no longer an issue.  10.Question of left apical nodule in her lungs and I am not involved in      following this but it is my understanding it is stable.  11.Ongoing prednisone therapy.   Cardiac status is stable.  I will be happy to see her at any time.  I will  see her back in one year in follow-up as needed.  Luis Abed, MD, [facility]    JDK/MedQ  DD:  10/05/2005  DT:  10/05/2005  Job #:  [id number]   cc:   Lianne Bushy, MD

## 2010-06-26 NOTE — Discharge Summary (Signed)
[name]. [facility]  Patient:    [name] Visit Number: [id number] MRN: [id number]          Service Type: Attending:  C. Ulyess Mort, M.D. Dictated by:   Leilani Able, M.D. Adm. Date:  04/13/01 Disc. Date: 04/24/01                             Discharge Summary  DISCHARGE DIAGNOSES:  1. Nausea and vomiting with gastrointestinal bleed.  2. Positive gastric secretions hemoccult positive, denies upper endoscopy.  3. Urinary tract infection.  4. Rheumatoid arthritis and has been on methotrexate treatment, now on     prednisone low dose treatment.  5. Hypertension.  6. History of large hiatal hernia, refused surgical intervention.  7. Prolonged QT interval.  8. Normocytic anemia secondary to chronic disease, iron studies showed     iron of 11, TIBC 191, percent saturation 6, and ferritin of 97.  9. Hypokalemia secondary to nausea and vomiting. 10. Sinus tachycardia secondary to dehydration. 11. History of appendectomy. 12. History of left breast lump removed that was benign. 13. On this admission the patient had a non-Q wave myocardial infarction. On     March 10, two-dimensional echocardiogram showed an ejection fraction of 45     to 50% with akinesis of the distal posterior, anterior, and apical wall.     LVEF approximately 45 to 50% and there was an echobright region that     cannot rule out thrombus near the apex.  LV size normal, LV wall thickness     was normal.  The patient had positive enzymes starting on March 7, ruled     in for MI and had a cardiac catheterization x2, first one was not able to     have intervention secondary to being uncomfortable on the table.  She had     80% LAD and 80% OM circumflex and on return to the catheterization lab she     had stent placed in the LAD and the circumflex.  DISCHARGE MEDICATIONS: 1. Amitriptyline 50 mg p.o. q.h.s. 2. Prednisone 5 mg p.o. q.d. 3. Nexium 40 mg p.o. q.d. 4. Plavix 75 mg p.o.  q.d. x31 days. 5. Aspirin 81 mg p.o. q.d. 6. Toprol XL 100 mg p.o. q.d. 7. Iron sulfate 325 mg p.o. t.i.d.  FOLLOW-UP:  The patient had an appointment to follow up with Dr. Nathanial Rancher within a week.  The patient also had an appointment to follow up with Dr. Myrtis Ser in three weeks.  CONSULTING PHYSICIANS:  Cardiology, Dr. Myrtis Ser.  Gastroenterology.  Surgery, Dr. Carolynne Edouard.  PROCEDURE:  Cardiac catheterization x2.  CHIEF COMPLAINT:  Nausea and vomiting.  HISTORY OF PRESENT ILLNESS:  Ms. [name] is a 77 year old white female with past medical history significant for rheumatoid arthritis.  She is on chronic prednisone therapy.  She also has a history of hypertension and a large hiatal hernia.  The patient presented to the emergency department with nausea, vomiting x1 day after dinner and no hematemesis at home, but on seeing the patient on the floor, she had vomited coffeeground emesis that was unresponsive to Phenergan.  The patient was switched to Zofran and still continued to vomit.  She denies abdominal pain, diarrhea, melena, or bright red blood per rectum.  She also complains of weakness, positive for constipation and dizziness.  No dysuria, no nocturia, no polyuria.  The patient complains of heartburn that has been more frequent lately  normally relieved by her Prilosec.  PAST MEDICAL HISTORY:  As per discharge problem list.  FAMILY HISTORY:  Noncontributory.  SOCIAL HISTORY:  She does not smoke, drink, or use IV drugs.  She has a son.  MEDICATIONS: 1. Amitriptyline 50 mg p.o. q.h.s. 2. Prednisone 5 mg p.o. q.d. 3. Gold shots. 4. Atenolol 50 mg p.o. q.h.s. 5. Tylenol as needed.  ALLERGIES:  No known drug allergies.  REVIEW OF SYSTEMS:  Positive for weakness, nausea and vomiting, hypertension, and murmurs, dyspnea on exertion.  Heartburn with Prilosec.  PHYSICAL EXAMINATION:  VITAL SIGNS: Temperature 96.9, blood pressure 122/92, pulse 102, respirations 16, pulse oximetry 97% on room  air.  GENERAL: The patient is sitting up in bed with sunken eyes, looks sad.  HEENT: Normocephalic and atraumatic.  Pupils are equally round and reactive to light and accommodation.  Mucous membranes were dry.  No LAD.  HEART: She was tachycardic with 2/6 systolic murmur.  LUNGS: Clear to auscultation bilaterally.  ABDOMEN: Soft and nontender with positive bowel sounds.  Mild mid epigastric soreness.  RECTAL: She refused.  EXTREMITIES: No edema.  She had deformed joints in fingers and toes.  LYMPHATICS:  No lymphadenopathy. NEUROLOGICAL: Cranial nerves II-XII intact. No focal deficits.  HOSPITAL COURSE:  #1 - Nausea and vomiting.  The patient was admitted to a regular floor and she continued to vomit.  The differential for her nausea and vomiting was probably secondary to her hiatal hernia versus infection with her urinary tract infection versus upper GI bleed.  Because the patient has a history of hiatal hernia and it looked large on abdominal ultrasound, surgery was consulted. The patient denied surgical intervention.  She, however, continued to have nausea and vomiting refractory to IV Protonix.  She was switched to IV Reglan, IV Protonix, and NG tube was placed and the patients nausea and vomiting resolved.  #2 - UTI.  The patients UA showed that she had a urinary tract infection and she was treated with Cipro.  Urine culture was negative.  #3 - Hypertension.  The patient had several episodes of hypotension during her hospitalization and she was repleted with IV fluids, her atenolol was held.  #4 - Hypokalemia.  Her hypokalemia was most likely secondary to her nausea and vomiting.  She was repleted.  #5 - Non-Q wave myocardial infarction.  The patient presented to the hospital with nausea and vomiting.  Cardiac enzymes on admission were normal.  However, during the patients stay, she developed tachycardia up into the 240s.  She also on the 9th complained of chest pain and became  hypoxic.  Another set of  cardiac enzymes were done and the patient ruled in for MI.  EKG showed some ST segment changes.  The patient was started on heparin and aspirin and continued on her beta blocker and her chest x-ray done at the time showed that she was in CHF.  Her IV fluids were stopped and she was given doses of Lasix and transferred to the ICU.  On the following day, on the 11th, the patient was taken to the catheterization lab and cardiac catheterization showed that she had blockage of the LAD and circumflex.  The patient was very uncomfortable on the table secondary to her rheumatoid arthritis, so she was taken back the next day and had stent placed to her LAD and her circumflex.  She was sent home on Plavix, aspirin, and beta blocker and will follow up with [facility], Dr. Myrtis Ser.  #6 - Urethral  bleeding.  The patient had a urethral bleed during her hospitalization most likely secondary to traumatic insertion of her Foley and that resolved during her hospitalization.  #7 - Normocytic anemia.  The patient had history of anemia and because of probably GI bleed from her positive gastric secretions hemoccult positive, GI saw the patient.  The patient refused to have an upper endoscopy done. Her hemoglobin remained stable during her hospitalization and she was discharged home on iron and told to follow up with GI.  DISCHARGE LABORATORY DATA:  Her abdominal films showed CHF, large hiatal hernia.  Her chest x-ray showed very large hiatal hernia, increase since last study in June of 2001.  Relatively stable faint left lung opacity unchanged from 2000.  Sodium 141, potassium 3.5, chloride 105, CO2 23, glucose 114, BUN 10, creatinine 0.6, calcium 8.9, total protein 6.7, albumin 3.6, AST.  Cardiac enzymes on admission CK 70, MB 2.4, troponin 0.02.  Next set; CK 103, MB 1.9, relative index 1.8, troponin 0.05.  Next set; CK 85, MB 1.8, troponin 0.15. Next set; CK 153, MB 10.6,  relative index 6.9, troponin 1.59.  Next set; CK 125, MB 8.7, relative index 7, troponin 1.69.  Next set; CK 88, MB 6.1, troponin 1.32.  TSH 1.041.  Iron studies; iron 11, TIBC 19, percent sat 6, ferritin 97.  UA; glucose negative, hemoglobin negative, bilirubin negative, ketones 50, nitrite negative, moderate leukocyte esterase, few epithelial cells, WBC 7 to 10, few bacteria.  Culture was negative.  Her gastric secretion was hemoccult positive.  Her liver function tests; AST 18, ALT 8, alkaline phosphatase 64, total bilirubin 0.5, magnesium 2.1, phosphorus 3.9, lipase 22. Dictated by:   Leilani Able, M.D. Attending:  C. Ulyess Mort, M.D. DD:  08/18/01 TD:  08/21/01 Job: [id number]

## 2010-06-26 NOTE — Cardiovascular Report (Signed)
[place]. [facility]  Patient:    [name], [name] Visit Number: [id number] MRN: [id number]          Service Type: MED Location: [facility] [id number] Attending Physician:  Edwyna Perfect Dictated by:   Veneda Melter, M.D. Proc. Date: 04/21/01 Admit Date:  04/13/2001   CC:         Luis Abed, M.D. [facility]  Dr. Nathanial Rancher, [place], [place]  C. Ulyess Mort, M.D.   Cardiac Catheterization  PROCEDURES PERFORMED: 1. Selective angiography. 2. Primary stent placement, left circumflex artery. 3. Percutaneous transluminal coronary angioplasty and stent placement in the    proximal left anterior descending. 4. Perclose, right femoral artery.  DIAGNOSES: 1. Two-vessel coronary artery disease. 2. Mild left ventricular systolic dysfunction. 3. Status post non-Q-wave myocardial infarction.  INDICATIONS: The patient is a 77 year old white female with interstitial lung disease and rheumatoid arthritis that is steroid-dependent, who presents with nausea, vomiting. The patient was admitted to the hospital and subsequently ruled in for non-Q-wave myocardial infarction. She underwent cardiac catheterization by Dr. Eden Emms on April 18, 2001, showing severe two-vessel coronary artery disease. Echocardiogram has shown mild left ventricular dysfunction involving the anterior and anterolateral wall as well as the posterior walls. Due to hypoxemia and discomfort, the patient was stabilized medically and is brought back today for percutaneous intervention.  TECHNIQUE: After informed consent was obtained, the patient was brought to the cardiac catheterization lab. A 7 French sheath was placed in the right femoral artery using modified Seldinger technique. A 7 French Q 3.5 guide catheter was then used to engage the left coronary artery and selective angiography performed. This confirmed the presence of a high-grade narrowing of 90% in the proximal LAD at the takeoff of the first  diagonal branch. This was then followed by a lesion of 70% in the mid section of the vessel. The first diagonal branch had a extremely acute takeoff of 120 degrees with mild narrowing of 50-60% at its ostium. The left circumflex artery was a medication caliber vessel that provides a major marginal branch. In the mid section there was a high-grade narrowing of 80% in the proximal segment of the marginal branch. Moderate disease of 50% was noted at the AV circumflex.  We elected to proceed with percutaneous intervention to the circumflex and LAD. The patient was given 300 mg of Plavix orally, Integrilin and heparin on a weight-adjusted basis to maintain ACT of greater than 225 seconds.  A 0.014 inch extra-support wire was introduced and this selectively engaged the left circumflex artery. It was advanced into the marginal branch and a 2.5 x 8 mm Express II stent introduced. This was primarily deployed in the proximal segment of the marginal branch at 14 atmospheres for 30 seconds.  Repeat angiography showed an excellent result with no residual stenosis and TIMI-3 flow at the circumflex artery and no vessel damage. The wire was then repositioned in the LAD. A luge wire was introduced and a brief attempt made to wire the small diagonal branch. However, due to the acute takeoff, this proved difficult. A 2.5 x 8 mm Quantum Maverick balloon was introduced and two inflations performed in the proximal and mid LAD at 8 atmospheres for 30 seconds. Repeat angiography showed significant improvement in the proximal LAD lesion with no compromise of the diagonal branch. There was moderate residual disease of 50% in the mid section. It was felt that further stabilization of this proximal LAD lesion with a stent would be necessary and  a 2.75 x 15 mm Multi-Link, Zeta stent was introduced. This was carefully positioned in the proximal and mid LAD to encompass both lesions and deployed at 12 atmospheres for 30  seconds.  Repeat angiography showed an excellent result with no residual stenosis, and no evidence of vessel damage. The first diagonal branch was patent. There was slight worsening of the ostial narrowing to perhaps 90%. However, there did appear to be TIMI-3 flow through the vessel. Final angiography was performed in various projections showing TIMI-3 flow through the left coronary artery, no distal vessel damage.  The guide catheter was then removed. A Perclose suture closure device was deployed to the right femoral artery until adequate hemostasis was achieved. The patient tolerated the procedure well and was transferred to the ward in stable condition.  FINAL RESULTS: 1. Successful primary stent placement in the first marginal branch and    left circumflex artery with reduction of 80% narrowing to 0% with    placement of 2.5 x 8 mm Express II stent. 2. Successful percutaneous transluminal coronary angioplasty and stent    placed in the proximal and mid left anterior descending with a sequential    90 and 70% narrowings to 0% with placement of 2.75 x 15 mm Zeta stent. Dictated by:   Veneda Melter, M.D. Attending Physician:  Edwyna Perfect DD:  04/21/01 TD:  04/22/01 Job: [id number]

## 2010-06-26 NOTE — Discharge Summary (Signed)
NAME:  [name], [name] NO.:  [id number]   MEDICAL RECORD NO.:  [id number]                  PATIENT TYPE:   LOCATION:                                       FACILITY:   PHYSICIAN:  Lazaro Arms, MD          DATE OF BIRTH:   DATE OF ADMISSION:  DATE OF DISCHARGE:                                 DISCHARGE SUMMARY   HISTORY:  [name] is a 77 year old female who was admitted on 09/11/2001  with nausea, vomiting, and abdominal pain.  She has had episodes of this in  the past.  She also has history of rheumatoid arthritis.  She has a history  of coronary disease status post PTCA in March for severe two-vessel disease,  history of hypertension, and history of appendectomy as well as a severe  hiatal hernia for which she has had in the past.   On admission, she was initially seen by [facility] given her  history.  They felt that, given her normal enzyme exam and recent normal  stress test, that this was not cardiac in nature.   HOSPITAL COURSE:  Initially the patient had severe nausea and vomiting not  responsive to antiemetics and required some nasogastric decompression.  She  did much better following an aggressive bowel regimen and release of a large  fecal impaction, and the nasogastric tube was discontinued on 09/12/2001, and  she was clinically much better.  However, because of her history of chronic  aspirin and prednisone use and the recurrent nature, gastroenterology  consult was requested to evaluate her for any ulcer disease and to assess  whether or not she could be intermittently obstructed with her hiatal  hernia.  The patient underwent endoscopy on August 6 by Dr. Marina Goodell.  He found  severe esophagitis, peptic ulcer disease without hemorrhage in the antrum,  and a large hiatal hernia.   The patient did well post procedure and was taking regular food without any  issues or pain.  On the day of discharge, 09/14/2001, she was in no  distress,  eating breakfast.  Her vital signs were within normal limits.  On exam, she  was alert and oriented x 3.  Abdominal exam was remarkable for positive  bowel sounds, soft, without any tenderness.  Cardiac exam and other exams  were also within normal limits.   DISCHARGE LABORATORY DATA:  Hematocrit 30 which we had been following  because of her guaiac-positive stools and her ulcer.  She did require one  unit blood transfusion as she dropped to a hematocrit of 25 initially.  Discharge SMA-7 was within normal limits with a potassium of 3.8 and a  creatinine of 0.6.   DISPOSITION:  The patient was discharged home in stable condition.   DISCHARGE DIAGNOSES:  1. Severe esophagitis.  2. Peptic ulcer disease.  3. Large hiatal hernia, not obstructing.  4. Coronary artery disease.  5. Rheumatoid arthritis.  DISCHARGE MEDICATIONS:  1. Protonix 40 mg p.o. b.i.d.  2. Toprol XL 100 mg p.o. q.d.  3. Lactulose 16 ml p.o. q.d., hold for loose stool.  4. Zocor 20 mg p.o. q.d.  5. Amitriptyline 50 mg p.o. q.h.s.  6. Prednisone 5 mg p.o. q.d.  7. Protonix 40 mg p.o. b.i.d.   FOLLOW UP:  The patient was instructed to call her primary doctor, Dr.  Nathanial Rancher, on discharge to be followed up next week.  [name] should also get  hematocrit checked next week.  If she were to have recurrence of abdominal  symptoms, she is to contact Dr. Marina Goodell with gastroenterology.   DISCHARGE INSTRUCTIONS:  The patient was also instructed not to take any  aspirin or nonsteroidals for the next two weeks.                                               Lazaro Arms, MD    AC/MEDQ  D:  09/14/2001  T:  09/19/2001  Job:  [id number]   cc:   Dr. Nathanial Rancher. [facility]   Wilhemina Bonito. Eda Keys., M.D. [facility]

## 2010-06-26 NOTE — Consult Note (Signed)
[place]. [facility]  Patient:    [name], [name] Visit Number: [id number] MRN: [id number]          Service Type: MED Location: [phone] Attending Physician:  Edwyna Perfect Dictated by:   Ollen Gross. Vernell Morgans, M.D. Proc. Date: 04/13/01 Admit Date:  04/13/2001                            Consultation Report  [name] is a 77 year old white female with a history of rheumatoid arthritis and interstitial lung disease who has known for many, many years that she has a large hiatal hernia.  This occasionally causes her some discomfort, but nothing that she has not been able to tolerate.  She usually treats herself just intermittently with Prilosec which seems to improve her symptoms and lately she states that she has not been taking her Prilosec.  Yesterday she developed acute onset of nausea and vomiting to the point where she came to the emergency department for further evaluation.  She denies any fevers, chills, chest pains, or shortness of breath.  She states she frequently has constipation.  Has not had any recent problems with diarrhea.  She has maintained her weight lately and has not lost any weight recently that was unintentional.  PAST MEDICAL HISTORY:  Significant for interstitial lung disease, rheumatoid arthritis, hypertension.  PAST SURGICAL HISTORY:  Significant for appendectomy.  MEDICATIONS: 1. Amitriptyline 50 mg q.d. 2. Atenolol 50 mg q.d. 3. Protonix 40 mg q.d. 4. Prednisone. 5. Carafate.  ALLERGIES:  No known drug allergies.  SOCIAL HISTORY:  She denies any use of alcohol or tobacco products.  FAMILY HISTORY:  Noncontributory.  PHYSICAL EXAMINATION  GENERAL;  She is an elderly white female in no acute distress.  SKIN:  Warm and dry with no jaundice.  HEENT:  Extraocular movements are intact.  Pupils are equal, round, and reactive to light.  NECK:  No bruits.  LUNGS:  Clear bilaterally.  HEART:  Regular rate and  rhythm with a 3/6 systolic murmur.  ABDOMEN:  Soft and nontender and nondistended.  EXTREMITIES:  No clubbing, cyanosis, edema with obvious rheumatoid changes.  NEUROLOGIC:  She is alert and oriented x4.  HEMATOLOGIC:  I could palpate no lymphadenopathy.  LABORATORIES:  She underwent a chest x-ray that showed a large hiatal hernia similar to her prior x-rays.  ASSESSMENT AND PLAN:  This is a 77 year old white female with a history of multiple medical problems who has had a chronic large hiatal hernia.  This certainly could be contributing to her current nausea and vomiting.  In speaking with her about this today she was adamant about not discussing any sort of surgical options for this.  I would agree with continued medical therapy for her hernia as she has been getting with acid blocking medication and possibly some promotility agents.  It appears that her hernia has appeared to be a hiatal type hernia on prior CT scans and not a paraesophageal hernia that would be at risk of volvulizing.  A repeat CT scan may help better define the anatomy and hernia.  If she continued to vomit she may benefit from nasogastric decompression of her stomach and passage of the nasogastric tube might also help to define whether her hernia was of a hiatal type versus a paraesophageal type given the large intrathoracic portion of the hernia, should she come to surgical repair of this and require the thoracic  surgeons to be involved, but as of this point in time she is not interested in discussing any surgical options.  We will certainly follow along with you while she is here and will offer any help that we possibly can. Dictated by:   Ollen Gross. Vernell Morgans, M.D. Attending Physician:  Edwyna Perfect DD:  04/13/01 TD:  04/14/01 Job: [id number]

## 2010-06-26 NOTE — Consult Note (Signed)
[name], [name]              ACCOUNT NO.:  [id number]   MEDICAL RECORD NO.:  [id number]          PATIENT TYPE:  INP   LOCATION:  2023                         FACILITY:  [facility]   PHYSICIAN:  Graylin Shiver, M.D.   DATE OF BIRTH:  05-23-1925   DATE OF CONSULTATION:  02/06/2004  DATE OF DISCHARGE:                                   CONSULTATION   REASON FOR CONSULTATION:  This patient is a 77 year old female who was  admitted to the hospital with high fever, lethargy, and malaise.  She has  been undergoing evaluation.  GI was consulted because last evening, the  patient had bright red rectal bleeding.  The patient also reported black  stools.  There has also been a drop in her hemoglobin over the last couple  of days from a level of 14 to 10.  The patient denies abdominal pain or  heartburn.  She does have a history of a hiatal hernia and severe  esophagitis in the past.  She states that she has never had her colon  checked.   PAST HISTORY:  Coronary artery disease,  hypertension, hyperlipidemia,  hiatal hernia, peptic ulcer disease, esophagitis, rheumatoid arthritis.   PAST SURGICAL HISTORY:  Appendectomy.   MEDICATIONS PRIOR TO ADMISSION:  Nexium, Amitriptyline, Toprol XL,  prednisone, gold injections, Lipitor.   ALLERGIES:  None known.   FAMILY HISTORY:  Negative for colon cancer.   SOCIAL HISTORY:  Does not smoke or drink alcohol.   REVIEW OF SYMPTOMS:  Currently denies chest pain, shortness of breath,  cough, or sputum production.   PHYSICAL EXAMINATION:  GENERAL:  She does not appear in any acute distress.  HEENT:  Nonicteric.  HEART:  Regular rhythm, no murmurs are heard.  LUNGS:  Clear.  ABDOMEN:  Soft, nontender, no hepatosplenomegaly.   IMPRESSION:  1.  Rectal bleeding.  2.  Melena.  3.  Anemia.   PLAN:  Proceed with EGD and colonoscopy to evaluate both the upper and lower  GI tract.       SFG/MEDQ  D:  02/06/2004  T:  02/06/2004  Job:  [id number]   cc:   Leighton Roach McDiarmid, M.D.  Fax: [phone]

## 2010-06-26 NOTE — Discharge Summary (Signed)
[name], [name]              ACCOUNT NO.:  [id number]   MEDICAL RECORD NO.:  [id number]          PATIENT TYPE:  INP   LOCATION:  2023                         FACILITY:  [facility]   PHYSICIAN:  Dwana Curd. Para March, M.D. DATE OF BIRTH:  May 23, 1925   DATE OF ADMISSION:  02/04/2004  DATE OF DISCHARGE:  02/07/2004                                 DISCHARGE SUMMARY   DISCHARGE DIAGNOSES:  1.  Fever.  2.  Dental caries.  3.  Rheumatoid arthritis.  4.  Hiatal hernia.  5.  Internal hemorrhoids.  6.  Hypertension.  7.  Coronary artery disease.  8.  Hypokalemia.  9.  Hyperlipidemia.  10. Dehydration with tachycardia.   DISCHARGE MEDICATIONS:  1.  Nexium 40 mg one p.o. daily.  2.  Amitriptyline 50 mg p.o. q.h.s.  3.  Toprol XL 50 mg p.o. daily.  4.  Prednisone 5 mg p.o. daily.  5.  Gold injections.  Followup with Dr. Kellie Simmering.  6.  Lipitor 10 mg p.o. daily.  7.  Pen VK 500 mg one tab p.o. q.i.d.  8.  K-Dur 20 mEq two tabs by mouth once a day.   FOLLOW UP:  1.  With Dr. Purnell Shoemaker February 12, 2004.  Dr. Purnell Shoemaker please note that the patient      was discharged on 40 mEq of K-Dur.  Please consider drawing basic      chemistries to measure the patient's potassium.   DICTATION ENDED AT THIS POINT.       GSD/MEDQ  D:  02/07/2004  T:  02/07/2004  Job:  [id number]   cc:   Lianne Bushy, M.D.  [address]  [place]  [place] [address]  Fax: [phone]

## 2010-06-26 NOTE — Op Note (Signed)
[name], [name]              ACCOUNT NO.:  [id number]   MEDICAL RECORD NO.:  [id number]          PATIENT TYPE:  INP   LOCATION:  2023                         FACILITY:  [facility]   PHYSICIAN:  Graylin Shiver, M.D.   DATE OF BIRTH:  September 15, 1925   DATE OF PROCEDURE:  02/07/2004  DATE OF DISCHARGE:                                 OPERATIVE REPORT   PROCEDURE:  Colonoscopy.   INDICATION FOR PROCEDURE:  Rectal bleeding, anemia.   Informed consent was obtained after explanation of the risks of bleeding  infection, and perforation.   ENDOSCOPIST:  Graylin Shiver, M.D.   PREMEDICATIONS:  The procedure was done immediately after an EGD with an  addition 10 mcg of Fentanyl and 2 mg of Versed given.   DESCRIPTION OF PROCEDURE:  With the patient in the left lateral decubitus  position, a rectal exam was performed, and no masses were felt.  The Olympus  colonoscope was inserted into the rectum and advanced around the colon to  the cecum.  Cecal landmarks were identified.  The cecum and ascending colon  were normal.  The transverse colon was normal.  The descending colon and  sigmoid and rectum were normal.  There were internal hemorrhoids.  The  patient tolerated the procedure well without complications.   IMPRESSION:  Normal colonoscopy to the cecum with internal hemorrhoids.       SFG/MEDQ  D:  02/07/2004  T:  02/07/2004  Job:  [id number]   cc:   Leighton Roach McDiarmid, M.D.  Fax: [phone]

## 2010-06-26 NOTE — Discharge Summary (Signed)
[name], [name]              ACCOUNT NO.:  [id number]   MEDICAL RECORD NO.:  [id number]          PATIENT TYPE:  INP   LOCATION:  2023                         FACILITY:  [facility]   PHYSICIAN:  Dwana Curd. Para March, M.D. DATE OF BIRTH:  Jun 25, 1925   DATE OF ADMISSION:  02/04/2004  DATE OF DISCHARGE:                                 DISCHARGE SUMMARY   DISCHARGE DIAGNOSES:  1.  Fever.  2.  Dental caries.  3.  Rheumatoid arthritis.  4.  Hiatal hernia.  5.  Internal hemorrhoids.  6.  Hypertension.  7.  Coronary artery disease.  8.  Hypokalemia.  9.  Hyperlipidemia.  10. Dehydration with tachycardia.   DISCHARGE MEDICATIONS:  1.  Nexium 40 mg one p.o. daily.  2.  Amitriptyline 50 mg one p.o. q.h.s.  3.  Toprol XL 50 mg one p.o. daily.  4.  Prednisone 5 mg one p.o. daily.  5.  Gold injections.  Follow-up with Dr. Kellie Simmering for these.  6.  Lipitor 10 mg one p.o. daily.  7.  Pen VK 500 mg one p.o. q.i.d. times ten days.  8.  K-Dur 20 mEq two tablets p.o. daily.   FOLLOW UP:  1.  Follow-up with Dr. Purnell Shoemaker on February 12, 2004 at 11:30 p.m.  Dr. Purnell Shoemaker,      please note that the patient was discharged on potassium due to      hypokalemia during this hospitalization.  Please consider drawing basic      chemistries to measure the patient's potassium.  2.  Follow-up appointment with Dr. Cindy Hazy.  The patient has dental caries      and needs evaluation.  The patient is to call [phone] for an      appointment.  3.  Follow-up with Dr. Kellie Simmering.  The patient has been followed by Dr.      Kellie Simmering for her rheumatoid arthritis and is to call on February 10, 2004      for an appointment, phone [phone].   PROCEDURES AND DIAGNOSTIC STUDIES:  1.  Esophagogastroduodenoscopy showing hiatal hernia.  Biopsy was taken and      is pending at the time of discharge.  CLOtest is pending.  2.  Colonoscopy showing interval hemorrhoids.  3.  Panorex showing periapical lucencies primarily of long teeth, #20,  21,      22 and 29 along with numerous dental caries.  4.  Pelvic ultrasound showing small retroflexed uterus, tiny amount of fluid      identified in the endometrial cavity, double layer endometrial stripe      thickness including a tiny amount of fluid in the 3 to 4 mm range. This      is most consistent with endometrial atrophy.  5.  Chest x-ray showing no acute abnormality, a very large hiatal hernia and      heart size within the upper limits of normal.   CONSULTATIONS:  1.  [facility].   HISTORY OF PRESENT ILLNESS:  The patient is a 77 year old female accompanied  by her son who presented with the acute onset of recent onset of  lethargy/malaise per the son. She apparently was nauseated beginning on  Sunday but had not vomiting. She has had poor p.o. intake times two days  now.  Her son was calling her today and could not understand what she was  saying.  She reported that she was much weaker today, but denies that she  was acutely disoriented or delirious.  She was brought to the emergency  department where she was found to be lethargic and diaphoretic with a fever  of 105 degrees.  Review of systems was notable for fever.  No chills. No  night sweats.  No weight loss.  No cough or sputum.  No dyspnea.  No chest  pain.  Positive nausea. No emesis. Positive constipation. No diarrhea.  She  denied hematuria, dysuria, hematochezia or bright red blood per rectum. She  denied worsening joint pain and denied symptoms consistent with effusions.   PHYSICAL EXAMINATION ON ADMISSION:  VITAL SIGNS:  Notable for a temperature  of 105 which dropped to 99.0, heart rate in the 130's to 140's, blood  pressure 115/68, 02 saturation 97% on two liters of nasal cannula.  GENERAL:  The patient was lethargic appearing but not in distress. She was  alert and oriented times three.  She was pale and diaphoretic.  HEENT:  She was noted to have poor dentition with broken teeth.  The neck  was  supple with no JVD.  CHEST:  Normal respiratory effort and was clear to auscultation.  CARDIOVASCULAR:  Tachycardic but with no murmur.  ABDOMEN:  Benign.  EXTREMITIES:  Noted changes secondary to rheumatoid arthritis.  RECTAL:  There was a large thrombosed external hemorrhoid. There was no  stool in the vault.  The Hemoccult was not completed secondary to the  hemorrhoid.   INTAKE LABS:  White count 11.6 and hemoglobin 14.0.  Initial potassium was  3.2.  UA was nitrite negative, 0 to 2 white blood cells, 0 to 2 red blood  cells per high powered field.  Sedimentation rate was 17.  Liver enzymes  were within normal limits.   HOSPITAL COURSE:  1.  FEVER.  The patient was admitted and actually the etiology of her fever      at the time of discharge is still undetermined.  Typically, an infection      of this type would be related to bacteremia. However, her blood cultures      were negative for greater than 48 hours with no growth to date at the      time of discharge.  The patient was otherwise clinically stable, so      antibiotics were held initially.  The sources included GI with      diverticulitis and Clostridium difficile, but she did not have symptoms      consistent with this.  Urine is a possible source of infection, but her      UA was normal. Her lungs were clear.  Blood cultures were listed as      above with no growth to date.  The patient was adequately mentating when      her fever came down. This could possibly due to a flare of her      rheumatoid arthritis, but her sed rate was normal and she did not have      joint changes.  The one abnormality that could be found as a source of      infection was dental caries; see problem #2.  During this  hospitalization, the patient was placed on Pen VK for her dental caries.      No other antibiotics were given.  The patient defervesced with Tylenol     and while she felt weak, she had no other active signs or symptoms of       infection and no other source for such a significant fever. As the      patient's fever came down and the patient remained afebrile for the      remainder of the hospitalization, the patient was deemed to be stable      for discharge home with follow-up scheduled with Dr. Purnell Shoemaker.  2.  DENTAL CARIES.  The patient has multiple dental caries. A Panorex was      taken with the above findings.  On-call dentistry was consulted.  This      was not necessarily felt to be the likely cause of the patient's fever.      However, the patient was prophylactically placed on Pen VK 500 mg one      tablet p.o. q.i.d. to be continued until the patient is to follow-up      with a dentist.  3.  RHEUMATOID ARTHRITIS.  The patient has a history of rheumatoid arthritis      that had been treated with gold injections and prednisone.  The      patient's prednisone was increased during this hospitalization to 20 mg      p.o. daily.  The patient was otherwise stable and without symptoms of an      acute flare.  The patient was discharged home in stable condition on her      home dose of prednisone and is to follow-up with Dr. Kellie Simmering.  4.  HIATAL HERNIA.  After presentation to the hospital the patient gave a      history of melena and one episode of bright red blood per rectum in the      toilet.  There was an unknown source of this, so multiple studies were      obtained.  Since the origin of the bleed was unknown, a UA was checked      for blood and this was found to have very few red blood cells. A      transvaginal ultrasound was ordered showing endometrial atrophy and no      visualized cause for vaginal bleed.  The remaining source was GI and GI      was consulted. The patient went for an esophagogastroduodenoscopy and      was found to have a hiatal hernia.  This had been visualized before.      CLOtest and biopsies were obtained and are pending at the time of      discharge.  5.  INTERNAL HEMORRHOIDS.  The  patient also had a colonoscopy during this      hospitalization because of the bright red blood per rectum times one.      The patient was found to have internal hemorrhoids along with the      hemorrhoids noted on rectal exam, but no other abnormalities were      visualized.  Notably, the patient's discharge hemoglobin was 11.3 and      the patient did not have a repeated episode of blood per rectum.  6.  HYPERTENSION.  The patient has a history of hypertension and was      restarted on Toprol 50 mg p.o. b.i.d. during this hospitalization.  The      patient had adequate antihypertensive control and was discharged on her      home medication of Toprol XL 50 mg one p.o. daily.  7.  CORONARY ARTERY DISEASE.  The patient has a history of coronary artery      disease and is status post stent times two. He was ruled out for a MI      during this hospitalization with no acute increase in serum cardiac      enzymes. 8.  HYPOKALEMIA.  The patient has a history of hypokalemia. This may have      been exacerbated by fluid loss secondary to the patient's fever and also      with the bowel clean out for the endoscopy, the patient was repleted      with potassium orally and was discharged home on K-Dur for follow-up      with Dr. Purnell Shoemaker.  9.  HYPERLIPIDEMIA.  The patient has a history of hyperlipidemia and was      continued on her statin during this hospitalization without      complications and was discharged home on her home regimen.  10. DEHYDRATION WITH TACHYCARDIA.  The patient came in apparently dehydrated      with tachycardia.  This was most likely due to the fever.  The patient      was rehydrated, her beta blocker was started and this resolved without      complications.   DISCHARGE LABORATORIES:  Urine microscopy:  White blood cells - none seen.  White blood cells 0 to 2 per high powered field.  CBC:  White count 6.1.  Hemoglobin 22.3.  Hematocrit 32.5.  Platelet count 213,000.  Sodium  142.  Potassium 3.6.  Chloride 120.  C02 15.  Glutose 103.  BUN 12.  Creatinine  0.7. Calcium 8.3.  Urinalysis only  for a moderate amount of blood.  TSH was 0.562.  Urine culture had no  growth.  Blood culture has no growth to date.  At the time greater than 48  hours times two at the time of discharge.  Please note that CLOtest and  biopsy are pending at the time of discharge as is the final re prot on blood  cultures.       GSD/MEDQ  D:  02/07/2004  T:  02/07/2004  Job:  [id number]   cc:   Lianne Bushy, M.D.  [address]  [place]  [place] [address]  Fax: [phone]   Aundra Dubin, M.D.   [facility]

## 2010-06-26 NOTE — Op Note (Signed)
NAMEANNER, BAITY              ACCOUNT NO.:  [id number]   MEDICAL RECORD NO.:  [id number]          PATIENT TYPE:  INP   LOCATION:  2023                         FACILITY:  [facility]   PHYSICIAN:  Graylin Shiver, M.D.   DATE OF BIRTH:  10/03/1925   DATE OF PROCEDURE:  02/07/2004  DATE OF DISCHARGE:                                 OPERATIVE REPORT   PROCEDURE:  Esophagogastroduodenoscopy with biopsy for CLOtest.   INDICATIONS:  Melena, anemia.   Informed consent was obtained after explanation of the risks of bleeding,  infection, and perforation.   PREMEDICATION:  Fentanyl 30 mcg IV, Versed 3 mg IV.   PROCEDURE:  With the patient in the left lateral decubitus position, the  Olympus gastroscope was inserted into the oropharynx and passed into the  esophagus.  It was advanced down the esophagus, which was tortuous.  The  stomach was then entered.  The esophagogastric junction was at 28 cm.  There  was a large hiatal hernia present.  The stomach had to be negotiated slowly  to advance the scope down to the region of the pylorus because the stomach  seemed to flop over on itself and was tortuous.  The duodenum was then  entered, the second portion and bulb of the duodenum looked normal.  The  stomach showed a diffuse erythematous appearance to the mucosa compatible  with gastritis.  A biopsy for CLOtest was obtained.  Other than the  gastritis and large hiatal hernia, no other abnormalities were seen  including gastric fundus and cardia seen on retroflexion.  The esophagus  looked normal.  She tolerated the procedure well without complications.   IMPRESSION:  1.  Large hiatal hernia.  2.  Gastritis, biopsied for CLOtest to look for evidence of Helicobacter      pylori.       SFG/MEDQ  D:  02/07/2004  T:  02/07/2004  Job:  [id number]   cc:   Leighton Roach McDiarmid, M.D.  Fax: [phone]

## 2010-06-26 NOTE — H&P (Signed)
[place]  Patient:    [name], [name]                         MRN: [id number] Adm. Date:  07/25/99 Attending:  Mena Pauls, M.D. Dictator:   Jacobo Forest, M.D. CC:         Dr. Nathanial Rancher, [place], [place]             Aundra Dubin, M.D.                         History and Physical  SERVICE:  Conservation officer, historic buildings.  CHIEF COMPLAINT:  Cough and fever x 1 week.  HISTORY OF PRESENT ILLNESS:  [name] is a 77 year old white female with severe rheumatoid arthritis, who presents to the ED with a one-week history of nonproductive cough, fever, overall body aches, weakness.  Patient had called Dr. Aundra Dubin earlier in the week advising him of symptoms.  Patient was given doxycycline.  Patient reports that for the past few days, she has had no improvement and decided to come to the emergency room.  Patient also has had a decreased p.o. intake.  Patient has been taking methotrexate and was advised to hold this medication until further notice.  No associated vomiting or diarrhea.  She did have some mild shortness of breath and chest pain after the coughing.  Mild nausea.  REVIEW OF SYSTEMS:  No dysuria, melena, hematochezia.  See HPI.  PAST MEDICAL HISTORY:  Rheumatoid arthritis, hypertension.  PAST SURGICAL HISTORY:  Appendectomy.  SOCIAL HISTORY:  Lives alone in [place].  Her son lives two miles away.  No history of smoking.  No alcohol.  MEDICATIONS: 1. Amitriptyline 50 mg p.o. q.h.s. 2. Methotrexate one dose q.wk. 3. Atenolol 50 mg q.h.s. 4. Doxycycline 100 mg for the past two to three days.  ALLERGIES:  No known drug allergies.  PHYSICAL EXAMINATION:  VITAL SIGNS:  Temperature 103.0, pulse 126, respirations 24, blood pressure 130/73.  O2 saturations 94% on room air.  GENERAL:  Alert and oriented, appears tired but nontoxic.  Mild respiratory distress.  HEENT:  PERRL.  Ears:  TMs pearly gray  bilaterally.  NECK:  Supple.  No lymphadenopathy.  Mouth dry.  CARDIOVASCULAR:  Regular rate.  No murmurs.  Tachycardic.  RESPIRATORY:  Clear to auscultation bilaterally.  No wheezes or crackles. Mild respiratory distress.  EXTREMITIES:  No edema, clubbing or cyanosis.  Hands with severe rheumatoid arthritis changes.  NEUROLOGIC:  Alert and oriented.  Strength 5/5 bilaterally.  PERRL.  SKIN:  No rash.  LABORATORY AND X-RAY FINDINGS:  Chest x-ray with hiatal hernia and peripheral hilar markings with a questionable bronchitis.  No focal infiltrates.  No acute pneumonitis.  WBC 4.6, hemoglobin 10.8, platelets 239,000, 76% neutrophils.  An i-STAT ABG: 7.463/35/67 on room air.  O2 saturation 92%.  ASSESSMENT AND PLAN:  [name] is a 77 year old white female with severe rheumatoid arthritis, on methotrexate, presenting with nonproductive cough, mild respiratory distress, fever and hypoxemia starting one week ago.  In the emergency department, patient was found to have a temperature of 103, respiratory rate of 24, PO2 of 67 on room air, WBC of 4.6.  Chest x-ray with a questionable bronchitis and no focal infiltrate.  Differential for this unusual pulmonary process could include atypical pneumonia versus acute bronchitis versus methotrexate pneumonitis versus Pneumocystis carinii pneumonia (immunocompromised from methotrexate).  Will initiate  treating as a pneumonia/bronchitis-type infection.  1. Respiratory distress:  See discussion above.  Will initially treat as an    infection with p.o. Levaquin.  Check blood cultures.  Will get a Legionella    urinary antigen.  Will also check an LDH (elevated in Pneumocystis carinii    pneumonia).  If patient does not improve, will consider a high-resolution    CT to look for more subtle findings and a possible methotrexate    pneumonitis.  Will also consider a pulmonary consult. 2. Hypoxemia:  PO2 67 from the arterial blood gas on room air;  see discussion    above.  Will start on nasal cannula oxygen and check continuous oxygen    saturations. 3. Anemia, mild:  Will check a folate level on a patient on methotrexate.    Recheck complete blood count in the a.m.  Check a reticulocyte count. 4. Hiatal hernia on chest x-ray:  Most likely chronic.  No old records. 5. Rheumatoid arthritis:  Will hold methotrexate until over acute episode and    patient discusses with [name]. DD:  07/26/99 TD:  07/26/99 Job: [id number]

## 2010-06-26 NOTE — Discharge Summary (Signed)
[place]. [facility]  Patient:    [name], [name]                       MRN: [id number] Adm. Date:  [id number] Disc. Date: [id number] Attending:  McDiarmid, Leighton Roach. Dictator:   Dorcas Mcmurray CC:         Burnell Blanks, M.D.             Charlaine Dalton. Sherene Sires, M.D. [facility]                           Discharge Summary  DATE OF BIRTH:  [birth year]  PRIMARY MEDICAL DOCTOR:  Burnell Blanks, M.D.  DISCHARGE DIAGNOSES: 1. Interstitial lung disease/felt like it could be due to methotrexate    toxicity. 2. Rheumatoid arthritis. 3. Hypertension. 4. Mild deconditioning.  DISCHARGE MEDICATIONS: 1. Amitriptyline 50 mg p.o. q.h.s. 2. Atenolol 50 mg p.o. q.h.s. 3. Protonix 40 mg p.o. q.d. 4. Prednisone taper 60 mg p.o. q.d. x 3 days, 40 mg p.o. q.d. x 3 days, 20 mg    p.o. q.d. x 3 days, then 10 mg p.o. q.d. x 3 days, then 5 mg p.o. q.d.    x 5 days, and then 5 mg mg p.o.d. x 4 days. 5. Sucralfate suspension 1 g/10 midline 5 ml swish and spit/swallow q.i.d.    (10-day supply given).  CONSULTS:  Charlaine Dalton. Sherene Sires, M.D., pulmonology.  PROCEDURES:  CT of the chest which revealed evidence of acute interstitial lung disease as evidenced by subtle nodule seen at the lung apices and small bilateral pleural effusions, as well as a large hiatal hernia.  HISTORY OF PRESENT ILLNESS:  This is a 77 year old white female with a history of severe rheumatoid arthritis, who presented to the emergency department with a one-week history of nonproductive cough, fever, myalgias, and weakness.  She was on doxycycline prescribed by Aundra Dubin, M.D., but had no improvement with this and thus came to the emergency room.  She reports that she has been taking methotrexate in the past, but Dr. Kellie Simmering told her to hold this medication after this recent episode.  PHYSICAL EXAMINATION:  On admission, the patient was found to have a temperature of 103 degrees, a pulse of 126, a respiratory rate of 20, and  an O2 saturation of 94% on room air.  LABORATORY DATA:  The ABG revealed a pO2 of 67% on room air and the white blood cell count was 4.6 at that time.  The chest x-ray showed peripheral hilar markings and questionable bronchitis, but no focal infiltrates.  HOSPITAL COURSE: #1 - RESPIRATORY DISTRESS:  The patient was admitted and originally treated as an infectious process with Levaquin.  When she did not improve, a high-resolution CT was performed which revealed evidence of interstitial lung disease with a possible etiology methotrexate pneumonitis.  At that time, a pulmonary consult was obtained and recommended starting steroids.  The patient was started on methylprednisolone 80 mg IV q.12h. and responded extremely well to that.  She rapidly defervesced.  Her oxygen requirement decreased significantly by the time of her discharge on July 30, 1999.  She was requiring, no oxygen, breathing comfortably, ambulating, and eager to go home. At that time, she had been afebrile for greater than 72 hours.  #2 - RHEUMATOID ARTHRITIS:  The patients joint pain quickly resolved after the use of the steroids.  Rheumatoid lung was considered on the differential and it  was felt that this was less likely of an etiology.  #3 - HYPERTENSION:  The blood pressure remained well controlled on atenolol.  CONDITION ON DISCHARGE:  Stable and greatly improved.  FOLLOW-UP:  1. The patient will follow up with Burnell Blanks, M.D., on Tuesday, August 04, 1999, at 10:30 a.m.  2. The patient will follow up at Dr. Jannifer Franklin office.  She is to call for an appointment next week.  DISCHARGE INSTRUCTIONS:  The patient was instructed that if she develops shortness of breath, fevers, weakness, or other problems or concerns, she is to call or return to Dr. Kathlen Mody office. DD:  07/30/99 TD:  07/31/99 Job: [id number]

## 2010-06-26 NOTE — Cardiovascular Report (Signed)
[place]. [facility]  Patient:    Marissa, Ellis Visit Number: [id number] MRN: [id number]          Service Type: MED Location: [facility] [id number] Attending Physician:  Edwyna Perfect Dictated by:   Noralyn Pick. Eden Emms, M.D. [facility] Proc. Date: 04/18/01 Admit Date:  04/13/2001   CC:         Luis Abed, M.D. [facility]   Cardiac Catheterization  PROCEDURE PERFORMED: Coronary arteriography.  INDICATIONS: Chest pain with positive enzymes. The patient was initially admitted with GI complaints of nausea, vomiting and a mild upper GI bleed. She was on heparin at the time of the catheterization. She has significant rheumatoid arthritis and interstitial fibrosis.  The patient did not tolerate laying flat on the catheterization lab table. On coming to the lab, her saturations were 88% with sinus tachycardia at 128.  ______ cardiography revealed septal and apical hypokinesis with an ejection fraction of 45%. We did not shoot an LV-gram.  Left main coronary artery had a 30% discrete stenosis.  Left anterior descending artery had an 80% mid vessel lesion at the takeoff of a small first diagonal branch.  The circumflex coronary artery had an 80% OM lesion.  The right coronary artery had 40-50% mid vessel lesion.  The aortic valve was crossed to do hemodynamics. LV pressure was 139/24 with a post A wave, EDP greater than 40.  There was no gradient across the aortic valve.  IMPRESSION/PLAN: The patient is not an ideal coronary artery bypass graft candidate since she is 77 years old with interstitial fibrosis and bad rheumatoid arthritis. I think her best option is to be stabilized over the next 24-48 hours and then bring her back for possible intervention on the left anterior descending. Given her wall motion abnormalities and ECG, I suspect that this is her culprit vessel. She may also need further intervention on her OM but currently I think if we intervene at this  stage she would need to be intubated and would be very uncomfortable on the table.  She has normal flow in both her circumflex and left anterior descending, and I think the better part of valor at this point is to stabilize the patient and improve her oxygenation and hemodynamics, and proceed possibly on Thursday with intervention. Dictated by:   Noralyn Pick Eden Emms, M.D. [facility] Attending Physician:  Edwyna Perfect DD:  04/18/01 TD:  04/19/01 Job: [id number]

## 2010-08-17 ENCOUNTER — Other Ambulatory Visit: Payer: Self-pay | Admitting: *Deleted

## 2010-08-17 MED ORDER — ESOMEPRAZOLE MAGNESIUM 40 MG PO CPDR: 40.0000 mg | DELAYED_RELEASE_CAPSULE | Freq: Every day | ORAL | Status: DC

## 2010-10-02 ENCOUNTER — Other Ambulatory Visit: Payer: Self-pay | Admitting: *Deleted

## 2010-10-02 MED ORDER — ATORVASTATIN CALCIUM 20 MG PO TABS: 20.0000 mg | ORAL_TABLET | Freq: Every day | ORAL | Status: DC

## 2010-12-01 ENCOUNTER — Other Ambulatory Visit: Payer: Self-pay | Admitting: Cardiology

## 2010-12-01 MED ORDER — METOPROLOL SUCCINATE ER 100 MG PO TB24: 100.0000 mg | ORAL_TABLET | Freq: Every day | ORAL | Status: DC

## 2010-12-02 ENCOUNTER — Telehealth: Payer: Self-pay | Admitting: Cardiology

## 2010-12-02 MED ORDER — AMITRIPTYLINE HCL 50 MG PO TABS: 50.0000 mg | ORAL_TABLET | Freq: Every day | ORAL | Status: DC

## 2010-12-02 NOTE — Telephone Encounter (Signed)
Elavil was not refilled yesterday per pt/ reviewed old ov and was refilled for a year prior by Dr Myrtis Ser. Made pt an app and refilled med till OV. Pt to discuss with Dr Myrtis Ser pt doesn't have PCP. Forward to [employer].

## 2010-12-02 NOTE — Telephone Encounter (Signed)
Pt has questions re a medication she couldn't get refilled

## 2010-12-17 ENCOUNTER — Encounter: Payer: Self-pay | Admitting: *Deleted

## 2010-12-24 ENCOUNTER — Other Ambulatory Visit: Payer: Self-pay | Admitting: Cardiology

## 2010-12-24 DIAGNOSIS — I6529 Occlusion and stenosis of unspecified carotid artery: Secondary | ICD-10-CM

## 2010-12-25 ENCOUNTER — Encounter (INDEPENDENT_AMBULATORY_CARE_PROVIDER_SITE_OTHER): Payer: Medicare Other | Admitting: *Deleted

## 2010-12-25 DIAGNOSIS — I6529 Occlusion and stenosis of unspecified carotid artery: Secondary | ICD-10-CM

## 2010-12-28 ENCOUNTER — Encounter: Payer: Self-pay | Admitting: Cardiology

## 2010-12-28 DIAGNOSIS — M069 Rheumatoid arthritis, unspecified: Secondary | ICD-10-CM | POA: Insufficient documentation

## 2010-12-28 DIAGNOSIS — I739 Peripheral vascular disease, unspecified: Secondary | ICD-10-CM

## 2010-12-28 DIAGNOSIS — R9430 Abnormal result of cardiovascular function study, unspecified: Secondary | ICD-10-CM | POA: Insufficient documentation

## 2010-12-28 DIAGNOSIS — Z79899 Other long term (current) drug therapy: Secondary | ICD-10-CM | POA: Insufficient documentation

## 2010-12-28 DIAGNOSIS — I340 Nonrheumatic mitral (valve) insufficiency: Secondary | ICD-10-CM | POA: Insufficient documentation

## 2010-12-28 DIAGNOSIS — E785 Hyperlipidemia, unspecified: Secondary | ICD-10-CM | POA: Insufficient documentation

## 2010-12-28 DIAGNOSIS — I8290 Acute embolism and thrombosis of unspecified vein: Secondary | ICD-10-CM | POA: Insufficient documentation

## 2010-12-28 DIAGNOSIS — I509 Heart failure, unspecified: Secondary | ICD-10-CM | POA: Insufficient documentation

## 2010-12-28 DIAGNOSIS — I2510 Atherosclerotic heart disease of native coronary artery without angina pectoris: Secondary | ICD-10-CM | POA: Insufficient documentation

## 2010-12-28 DIAGNOSIS — IMO0002 Reserved for concepts with insufficient information to code with codable children: Secondary | ICD-10-CM | POA: Insufficient documentation

## 2010-12-28 DIAGNOSIS — J849 Interstitial pulmonary disease, unspecified: Secondary | ICD-10-CM | POA: Insufficient documentation

## 2010-12-29 ENCOUNTER — Encounter: Payer: Self-pay | Admitting: Cardiology

## 2010-12-29 ENCOUNTER — Ambulatory Visit (INDEPENDENT_AMBULATORY_CARE_PROVIDER_SITE_OTHER): Payer: Medicare Other | Admitting: Cardiology

## 2010-12-29 DIAGNOSIS — I059 Rheumatic mitral valve disease, unspecified: Secondary | ICD-10-CM

## 2010-12-29 DIAGNOSIS — I358 Other nonrheumatic aortic valve disorders: Secondary | ICD-10-CM

## 2010-12-29 DIAGNOSIS — I2510 Atherosclerotic heart disease of native coronary artery without angina pectoris: Secondary | ICD-10-CM

## 2010-12-29 DIAGNOSIS — I779 Disorder of arteries and arterioles, unspecified: Secondary | ICD-10-CM

## 2010-12-29 DIAGNOSIS — I359 Nonrheumatic aortic valve disorder, unspecified: Secondary | ICD-10-CM

## 2010-12-29 DIAGNOSIS — I10 Essential (primary) hypertension: Secondary | ICD-10-CM

## 2010-12-29 DIAGNOSIS — I509 Heart failure, unspecified: Secondary | ICD-10-CM

## 2010-12-29 DIAGNOSIS — I340 Nonrheumatic mitral (valve) insufficiency: Secondary | ICD-10-CM

## 2010-12-29 NOTE — Assessment & Plan Note (Signed)
Coronary disease is stable.  I considered whether we should do any type of exercise testing and I have decided against it.  She is stable.

## 2010-12-29 NOTE — Patient Instructions (Signed)
Your physician wants you to follow-up in:  12 months.  You will receive a reminder letter in the mail two months in advance. If you don't receive a letter, please call our office to schedule the follow-up appointment.   

## 2010-12-29 NOTE — Assessment & Plan Note (Signed)
She has had no recurrence of congestive heart failure.  She had a single episode in the past.

## 2010-12-29 NOTE — Assessment & Plan Note (Signed)
Hypertension is controlled.  No change in therapy.  Plan followup in one year.

## 2010-12-29 NOTE — Assessment & Plan Note (Signed)
She has a murmur of aortic valve sclerosis.  She does not need a followup echo at this time.

## 2010-12-29 NOTE — Assessment & Plan Note (Signed)
The patient has had followup carotid Dopplers.  She has mild stable bilateral disease of 0-39%.

## 2010-12-29 NOTE — Assessment & Plan Note (Signed)
Patient has mild bileaflet prolapse with mild MR.  She does not need a followup echo.  We may consider one next year.

## 2010-12-29 NOTE — Progress Notes (Signed)
HPI   Patient is seen today for cardiology followup.  I saw her last October, 2011.  She has known coronary disease with a two-vessel intervention in 2003.  She has not had any stress testing since then.  She had an echo in 2009 revealing an ejection fraction of 60% with mild collapse of the anterior and posterior leaflets, mild mitral regurgitation and mild aortic sclerosis.  She's not been having any chest pain.  She has deforming rheumatoid arthritis.  As part of today's evaluation I have reviewed my old records.  I have completely updated the new electronic medical record. No Known Allergies  Current Outpatient Prescriptions  Medication Sig Dispense Refill  . acetaminophen (TYLENOL) 500 MG tablet Take 500 mg by mouth every 6 (six) hours as needed.        [name] Kitchen amitriptyline (ELAVIL) 50 MG tablet Take 1 tablet (50 mg total) by mouth at bedtime.  30 tablet  0  . atorvastatin (LIPITOR) 20 MG tablet Take 1 tablet (20 mg total) by mouth daily.  30 tablet  6  . Calcium Carbonate-Vitamin D (CALCIUM 600+D) 600-400 MG-UNIT per tablet Take 1 tablet by mouth daily.        [name] Kitchen esomeprazole (NEXIUM) 40 MG capsule Take 1 capsule (40 mg total) by mouth daily before breakfast.  30 capsule  5  . leflunomide (ARAVA) 20 MG tablet Take 20 mg by mouth daily.        . metoprolol (TOPROL XL) 100 MG 24 hr tablet Take 1 tablet (100 mg total) by mouth daily.  90 tablet  0  . Multiple Vitamins-Minerals (EYE VITAMINS PO) Take by mouth daily.        . predniSONE (DELTASONE) 5 MG tablet Take 5 mg by mouth daily.          History   Social History  . Marital Status: Widowed    Spouse Name: N/A    Number of Children: N/A  . Years of Education: N/A   Occupational History  . Not on file.   Social History Main Topics  . Smoking status: Never Smoker   . Smokeless tobacco: Not on file  . Alcohol Use: No  . Drug Use: Not on file  . Sexually Active: Not on file   Other Topics Concern  . Not on file   Social History  Narrative  . No narrative on file    No family history on file.  Past Medical History  Diagnosis Date  . CAD (coronary artery disease)     2 vessel intervention 2003  . Dyslipidemia   . CHF (congestive heart failure)     single episode  . Mitral regurgitation     mild prolapse anterior & posterior leaflets  . Aortic valve sclerosis     mild moderate calcification, echo, 2009  . HTN (hypertension)     difficult to obtain BP at times.  [name] Kitchen Urethral trauma     bleeding in hospital  . Rheumatoid arthritis     severe - deforming  . Lung disease, interstitial     related to rheumatoid arthritis, also question  of left apical nodule...followed elsewhere..my understanding stabilized  . Carotid artery disease     doppler 12/25/2010,  0-39% bilateral  . Ejection fraction     EF 60%, echo, 11/2007  . Clot     ??? apical clot in the past ?? no longer an issue  . Drug therapy     Prednisone.    No past  surgical history on file.  ROS     She denies fever, chills, headache, sweats, rash, change in vision, change in hearing, chest pain, cough, nausea vomiting, urinary symptoms.  All other systems are reviewed and are negative.  PHYSICAL EXAM    Patient is stable.  She has the findings of deforming rheumatoid arthritis.  She is oriented to person time and place.  Affect is normal.  There is no jugular venous distention.  Lungs are clear.  Respiratory effort is not labored.  Cardiac exam reveals S1 and S2.  There is a soft systolic murmur.  The abdomen is soft.  There is no peripheral edema.  There are no skin rashes.  She has marked changes of deforming rheumatoid arthritis of the hands.  Filed Vitals:   12/29/10 0848  BP: 110/72  Pulse: 93  Height: 5\' 4"  (1.626 m)  Weight: 110 lb (49.896 kg)    EKG EKG is done today and reviewed by me.  There is normal sinus rhythm and no significant change. ASSESSMENT & PLAN

## 2011-02-25 ENCOUNTER — Other Ambulatory Visit: Payer: Self-pay | Admitting: *Deleted

## 2011-02-25 MED ORDER — METOPROLOL SUCCINATE ER 100 MG PO TB24: 100.0000 mg | ORAL_TABLET | Freq: Every day | ORAL | Status: DC

## 2011-04-26 ENCOUNTER — Other Ambulatory Visit: Payer: Self-pay

## 2011-04-26 MED ORDER — ESOMEPRAZOLE MAGNESIUM 40 MG PO CPDR: 40.0000 mg | DELAYED_RELEASE_CAPSULE | Freq: Every day | ORAL | Status: DC

## 2011-05-27 ENCOUNTER — Other Ambulatory Visit: Payer: Self-pay | Admitting: *Deleted

## 2011-05-27 MED ORDER — AMITRIPTYLINE HCL 50 MG PO TABS: 50.0000 mg | ORAL_TABLET | Freq: Every day | ORAL | Status: DC

## 2011-05-31 ENCOUNTER — Other Ambulatory Visit: Payer: Self-pay | Admitting: Cardiology

## 2011-05-31 MED ORDER — METOPROLOL SUCCINATE ER 100 MG PO TB24: 100.0000 mg | ORAL_TABLET | Freq: Every day | ORAL | Status: DC

## 2011-08-18 ENCOUNTER — Other Ambulatory Visit: Payer: Self-pay | Admitting: *Deleted

## 2011-08-18 MED ORDER — ATORVASTATIN CALCIUM 20 MG PO TABS: 20.0000 mg | ORAL_TABLET | Freq: Every day | ORAL | Status: DC

## 2012-02-02 ENCOUNTER — Emergency Department (HOSPITAL_COMMUNITY): Payer: Medicare Other

## 2012-02-02 ENCOUNTER — Encounter (HOSPITAL_COMMUNITY): Payer: Self-pay | Admitting: Emergency Medicine

## 2012-02-02 ENCOUNTER — Ambulatory Visit: Payer: Medicare Other

## 2012-02-02 ENCOUNTER — Inpatient Hospital Stay (HOSPITAL_COMMUNITY)
Admission: EM | Admit: 2012-02-02 | Discharge: 2012-02-08 | DRG: 445 | Disposition: A | Discharge status: Home Health | Payer: Medicare Other | Attending: Internal Medicine | Admitting: Internal Medicine

## 2012-02-02 ENCOUNTER — Ambulatory Visit (INDEPENDENT_AMBULATORY_CARE_PROVIDER_SITE_OTHER): Payer: Medicare Other | Admitting: Family Medicine

## 2012-02-02 VITALS — BP 110/60 | HR 85 | Temp 98.0°F | Resp 16 | Ht 61.5 in | Wt 103.2 lb

## 2012-02-02 DIAGNOSIS — R7881 Bacteremia: Secondary | ICD-10-CM | POA: Diagnosis present

## 2012-02-02 DIAGNOSIS — A419 Sepsis, unspecified organism: Secondary | ICD-10-CM

## 2012-02-02 DIAGNOSIS — IMO0002 Reserved for concepts with insufficient information to code with codable children: Secondary | ICD-10-CM

## 2012-02-02 DIAGNOSIS — E876 Hypokalemia: Secondary | ICD-10-CM | POA: Diagnosis present

## 2012-02-02 DIAGNOSIS — I6529 Occlusion and stenosis of unspecified carotid artery: Secondary | ICD-10-CM | POA: Diagnosis present

## 2012-02-02 DIAGNOSIS — I2789 Other specified pulmonary heart diseases: Secondary | ICD-10-CM | POA: Diagnosis present

## 2012-02-02 DIAGNOSIS — K8020 Calculus of gallbladder without cholecystitis without obstruction: Secondary | ICD-10-CM | POA: Diagnosis present

## 2012-02-02 DIAGNOSIS — K449 Diaphragmatic hernia without obstruction or gangrene: Secondary | ICD-10-CM

## 2012-02-02 DIAGNOSIS — K8042 Calculus of bile duct with acute cholecystitis without obstruction: Secondary | ICD-10-CM | POA: Diagnosis present

## 2012-02-02 DIAGNOSIS — M069 Rheumatoid arthritis, unspecified: Secondary | ICD-10-CM | POA: Diagnosis present

## 2012-02-02 DIAGNOSIS — I8290 Acute embolism and thrombosis of unspecified vein: Secondary | ICD-10-CM

## 2012-02-02 DIAGNOSIS — I10 Essential (primary) hypertension: Secondary | ICD-10-CM

## 2012-02-02 DIAGNOSIS — D638 Anemia in other chronic diseases classified elsewhere: Secondary | ICD-10-CM | POA: Diagnosis present

## 2012-02-02 DIAGNOSIS — R599 Enlarged lymph nodes, unspecified: Secondary | ICD-10-CM | POA: Diagnosis present

## 2012-02-02 DIAGNOSIS — D649 Anemia, unspecified: Secondary | ICD-10-CM | POA: Diagnosis present

## 2012-02-02 DIAGNOSIS — I059 Rheumatic mitral valve disease, unspecified: Secondary | ICD-10-CM | POA: Diagnosis present

## 2012-02-02 DIAGNOSIS — F329 Major depressive disorder, single episode, unspecified: Secondary | ICD-10-CM | POA: Diagnosis present

## 2012-02-02 DIAGNOSIS — R590 Localized enlarged lymph nodes: Secondary | ICD-10-CM | POA: Diagnosis present

## 2012-02-02 DIAGNOSIS — I2510 Atherosclerotic heart disease of native coronary artery without angina pectoris: Secondary | ICD-10-CM | POA: Diagnosis present

## 2012-02-02 DIAGNOSIS — K819 Cholecystitis, unspecified: Secondary | ICD-10-CM

## 2012-02-02 DIAGNOSIS — E785 Hyperlipidemia, unspecified: Secondary | ICD-10-CM

## 2012-02-02 DIAGNOSIS — J849 Interstitial pulmonary disease, unspecified: Secondary | ICD-10-CM | POA: Diagnosis present

## 2012-02-02 DIAGNOSIS — Z79899 Other long term (current) drug therapy: Secondary | ICD-10-CM

## 2012-02-02 DIAGNOSIS — R109 Unspecified abdominal pain: Secondary | ICD-10-CM

## 2012-02-02 DIAGNOSIS — R82998 Other abnormal findings in urine: Secondary | ICD-10-CM

## 2012-02-02 DIAGNOSIS — I509 Heart failure, unspecified: Secondary | ICD-10-CM | POA: Diagnosis present

## 2012-02-02 DIAGNOSIS — K219 Gastro-esophageal reflux disease without esophagitis: Secondary | ICD-10-CM | POA: Diagnosis present

## 2012-02-02 DIAGNOSIS — B9620 Unspecified Escherichia coli [E. coli] as the cause of diseases classified elsewhere: Secondary | ICD-10-CM | POA: Diagnosis present

## 2012-02-02 DIAGNOSIS — R8271 Bacteriuria: Secondary | ICD-10-CM

## 2012-02-02 DIAGNOSIS — A498 Other bacterial infections of unspecified site: Secondary | ICD-10-CM | POA: Diagnosis present

## 2012-02-02 DIAGNOSIS — R110 Nausea: Secondary | ICD-10-CM

## 2012-02-02 DIAGNOSIS — I340 Nonrheumatic mitral (valve) insufficiency: Secondary | ICD-10-CM | POA: Diagnosis present

## 2012-02-02 DIAGNOSIS — F3289 Other specified depressive episodes: Secondary | ICD-10-CM | POA: Diagnosis present

## 2012-02-02 DIAGNOSIS — I779 Disorder of arteries and arterioles, unspecified: Secondary | ICD-10-CM

## 2012-02-02 DIAGNOSIS — K8000 Calculus of gallbladder with acute cholecystitis without obstruction: Principal | ICD-10-CM | POA: Diagnosis present

## 2012-02-02 DIAGNOSIS — J8410 Pulmonary fibrosis, unspecified: Secondary | ICD-10-CM | POA: Diagnosis present

## 2012-02-02 DIAGNOSIS — K810 Acute cholecystitis: Secondary | ICD-10-CM | POA: Diagnosis present

## 2012-02-02 DIAGNOSIS — Z7982 Long term (current) use of aspirin: Secondary | ICD-10-CM

## 2012-02-02 DIAGNOSIS — I358 Other nonrheumatic aortic valve disorders: Secondary | ICD-10-CM

## 2012-02-02 DIAGNOSIS — R9430 Abnormal result of cardiovascular function study, unspecified: Secondary | ICD-10-CM

## 2012-02-02 DIAGNOSIS — Z9861 Coronary angioplasty status: Secondary | ICD-10-CM

## 2012-02-02 LAB — CBC WITH DIFFERENTIAL/PLATELET
Eosinophils Relative: 2 % (ref 0–5)
HCT: 36.9 % (ref 36.0–46.0)
Lymphocytes Relative: 8 % — ABNORMAL LOW (ref 12–46)
Lymphs Abs: 0.9 10*3/uL (ref 0.7–4.0)
MCV: 97.6 fL (ref 78.0–100.0)
Neutro Abs: 9.2 10*3/uL — ABNORMAL HIGH (ref 1.7–7.7)
Platelets: 185 10*3/uL (ref 150–400)
RBC: 3.78 MIL/uL — ABNORMAL LOW (ref 3.87–5.11)
WBC: 11.6 10*3/uL — ABNORMAL HIGH (ref 4.0–10.5)

## 2012-02-02 LAB — POCT URINALYSIS DIPSTICK
Glucose, UA: NEGATIVE
Nitrite, UA: POSITIVE
Urobilinogen, UA: 0.2

## 2012-02-02 LAB — HEPATIC FUNCTION PANEL
ALT: 11 U/L (ref 0–35)
AST: 41 U/L — ABNORMAL HIGH (ref 0–37)
Alkaline Phosphatase: 83 U/L (ref 39–117)
Total Protein: 7 g/dL (ref 6.0–8.3)

## 2012-02-02 LAB — PROTIME-INR
INR: 0.97 (ref 0.00–1.49)
Prothrombin Time: 12.8 seconds (ref 11.6–15.2)

## 2012-02-02 LAB — POCT CBC
HCT, POC: 41.7 % (ref 37.7–47.9)
Hemoglobin: 12.5 g/dL (ref 12.2–16.2)
Lymph, poc: 1.7 (ref 0.6–3.4)
MCH, POC: 30.1 pg (ref 27–31.2)
MCHC: 30 g/dL — AB (ref 31.8–35.4)
WBC: 12.8 10*3/uL — AB (ref 4.6–10.2)

## 2012-02-02 LAB — MAGNESIUM: Magnesium: 2 mg/dL (ref 1.5–2.5)

## 2012-02-02 LAB — BASIC METABOLIC PANEL
CO2: 25 mEq/L (ref 19–32)
Calcium: 9.3 mg/dL (ref 8.4–10.5)
Chloride: 98 mEq/L (ref 96–112)
Glucose, Bld: 101 mg/dL — ABNORMAL HIGH (ref 70–99)
Sodium: 135 mEq/L (ref 135–145)

## 2012-02-02 LAB — POCT UA - MICROSCOPIC ONLY
Casts, Ur, LPF, POC: NEGATIVE
Yeast, UA: NEGATIVE

## 2012-02-02 LAB — APTT: aPTT: 27 seconds (ref 24–37)

## 2012-02-02 LAB — LACTIC ACID, PLASMA: Lactic Acid, Venous: 1.9 mmol/L (ref 0.5–2.2)

## 2012-02-02 LAB — TROPONIN I: Troponin I: <0.3 ng/mL (ref <0.30)

## 2012-02-02 LAB — GLUCOSE, POCT (MANUAL RESULT ENTRY): POC Glucose: 103 mg/dl — AB (ref 70–99)

## 2012-02-02 MED ORDER — DEXTROSE-NACL 5-0.9 % IV SOLN
INTRAVENOUS | Status: DC
  Administered 2012-02-02 – 2012-02-05 (×3): via INTRAVENOUS

## 2012-02-02 MED ORDER — SODIUM CHLORIDE 0.9 % IV SOLN
3.0000 g | Freq: Once | INTRAVENOUS | Status: AC
  Administered 2012-02-02: 3 g via INTRAVENOUS
  Filled 2012-02-02: qty 3

## 2012-02-02 MED ORDER — HYDROCORTISONE SOD SUCCINATE 100 MG IJ SOLR
50.0000 mg | Freq: Three times a day (TID) | INTRAMUSCULAR | Status: DC
Start: 2012-02-03 — End: 2012-02-03
  Filled 2012-02-02 (×2): qty 1

## 2012-02-02 MED ORDER — ONDANSETRON HCL 4 MG/2ML IJ SOLN
4.0000 mg | Freq: Once | INTRAMUSCULAR | Status: AC
  Administered 2012-02-02: 4 mg via INTRAVENOUS
  Filled 2012-02-02: qty 2

## 2012-02-02 MED ORDER — HEPARIN SODIUM (PORCINE) 5000 UNIT/ML IJ SOLN
5000.0000 [IU] | Freq: Three times a day (TID) | INTRAMUSCULAR | Status: DC
  Administered 2012-02-02 – 2012-02-04 (×5): 5000 [IU] via SUBCUTANEOUS
  Filled 2012-02-02 (×8): qty 1

## 2012-02-02 MED ORDER — SODIUM CHLORIDE 0.9 % IV SOLN
1.5000 g | Freq: Four times a day (QID) | INTRAVENOUS | Status: DC
  Administered 2012-02-03 – 2012-02-06 (×14): 1.5 g via INTRAVENOUS
  Administered 2012-02-06: via INTRAVENOUS
  Administered 2012-02-06 – 2012-02-08 (×7): 1.5 g via INTRAVENOUS
  Filled 2012-02-02 (×25): qty 1.5

## 2012-02-02 MED ORDER — SODIUM CHLORIDE 0.9 % IV BOLUS (SEPSIS)
1000.0000 mL | Freq: Once | INTRAVENOUS | Status: AC
  Administered 2012-02-02: 1000 mL via INTRAVENOUS

## 2012-02-02 MED ORDER — HYDROCORTISONE SOD SUCCINATE 100 MG IJ SOLR
50.0000 mg | Freq: Three times a day (TID) | INTRAMUSCULAR | Status: DC
  Filled 2012-02-02: qty 2

## 2012-02-02 MED ORDER — HYDROMORPHONE HCL PF 1 MG/ML IJ SOLN: 1.0000 mg | INTRAMUSCULAR | Status: DC | PRN

## 2012-02-02 MED ORDER — METOPROLOL SUCCINATE ER 100 MG PO TB24
100.0000 mg | ORAL_TABLET | Freq: Every day | ORAL | Status: DC
  Administered 2012-02-03 – 2012-02-08 (×6): 100 mg via ORAL
  Filled 2012-02-02 (×6): qty 1

## 2012-02-02 MED ORDER — SODIUM CHLORIDE 0.9 % IV SOLN
Freq: Once | INTRAVENOUS | Status: AC
  Administered 2012-02-02: 17:00:00 via INTRAVENOUS

## 2012-02-02 MED ORDER — ATORVASTATIN CALCIUM 20 MG PO TABS
20.0000 mg | ORAL_TABLET | Freq: Every day | ORAL | Status: DC
  Administered 2012-02-03 – 2012-02-07 (×5): 20 mg via ORAL
  Filled 2012-02-02 (×6): qty 1

## 2012-02-02 MED ORDER — CALCIUM CARBONATE-VITAMIN D 500-200 MG-UNIT PO TABS
1.0000 | ORAL_TABLET | Freq: Every day | ORAL | Status: DC
  Administered 2012-02-04 – 2012-02-08 (×5): 1 via ORAL
  Filled 2012-02-02 (×6): qty 1

## 2012-02-02 MED ORDER — SODIUM CHLORIDE 0.9 % IJ SOLN
3.0000 mL | Freq: Two times a day (BID) | INTRAMUSCULAR | Status: DC
  Administered 2012-02-02 – 2012-02-06 (×3): 3 mL via INTRAVENOUS

## 2012-02-02 MED ORDER — PREDNISONE 20 MG PO TABS
40.0000 mg | ORAL_TABLET | Freq: Every day | ORAL | Status: DC
  Filled 2012-02-02 (×2): qty 2

## 2012-02-02 MED ORDER — ACETAMINOPHEN 500 MG PO TABS
1000.0000 mg | ORAL_TABLET | Freq: Four times a day (QID) | ORAL | Status: DC | PRN
  Administered 2012-02-04 – 2012-02-07 (×6): 1000 mg via ORAL
  Filled 2012-02-02 (×2): qty 2
  Filled 2012-02-02: qty 1
  Filled 2012-02-02 (×4): qty 2

## 2012-02-02 MED ORDER — ONDANSETRON HCL 4 MG/2ML IJ SOLN: 4.0000 mg | Freq: Four times a day (QID) | INTRAMUSCULAR | Status: DC | PRN

## 2012-02-02 MED ORDER — AMITRIPTYLINE HCL 50 MG PO TABS
50.0000 mg | ORAL_TABLET | Freq: Every day | ORAL | Status: DC
  Administered 2012-02-02 – 2012-02-07 (×6): 50 mg via ORAL
  Filled 2012-02-02 (×7): qty 1

## 2012-02-02 MED ORDER — HYDROCORTISONE SOD SUCCINATE 100 MG IJ SOLR
50.0000 mg | Freq: Once | INTRAMUSCULAR | Status: AC
  Administered 2012-02-02: 50 mg via INTRAVENOUS
  Filled 2012-02-02: qty 2

## 2012-02-02 MED ORDER — ONDANSETRON HCL 4 MG PO TABS: 4.0000 mg | ORAL_TABLET | Freq: Four times a day (QID) | ORAL | Status: DC | PRN

## 2012-02-02 MED ORDER — PANTOPRAZOLE SODIUM 40 MG PO TBEC
40.0000 mg | DELAYED_RELEASE_TABLET | Freq: Every day | ORAL | Status: DC
  Administered 2012-02-03 – 2012-02-08 (×6): 40 mg via ORAL
  Filled 2012-02-02 (×6): qty 1

## 2012-02-02 NOTE — ED Notes (Signed)
Dr. Rhunette Croft informed me to hold off on central line insertion.

## 2012-02-02 NOTE — H&P (Signed)
[facility] History and Physical  ANJANNETTE GAUGER ZOX:[id number] DOB: February 28, 1925    PCP:   No primary provider on file.   Chief Complaint: abdominal pain, nausea and vomiting.  HPI: Marissa WAHLER is an 78 y.o. female with hx of CAD, prior MI and had 2 cardiac stents, aortic slerosis, MR, CHF, RA HTN,presents to the urgent care with one day hx of RUQ pain, nausea and vomiting.  Son states she had intermittent symptoms like this in the past.  She denied fever or chills, black or bloody stool, chest pain or SOB.  X ray at urgent care showed large hiatal hernia and distended stomach, so she was recommended to come to ER.  Evaluation in ER showed WBC 12K, Hb 11g/DL, normal lipase and liver fx tests, normal renal fx tests.  A [place] originally was read as unremarkable. Surgeon was consulted, saw her, and recommended CT abd with oral contrast to r/out volvulus.  Her CT showed pericystic wall thickening, with large stone, and dilated CBD, all suggestive of choledocholithiasis with acute cholecystitis.  Hospitalist was asked to admit her for further tx of the above aforementions.  In the ER, she appears to be stable, except with tachycardia.  It should be noted she has been on chronic steroid for her RA.  Rewiew of Systems:  Constitutional: Negative for malaise, fever and chills. No significant weight loss or weight gain Eyes: Negative for eye pain, redness and discharge, diplopia, visual changes, or flashes of light. ENMT: Negative for ear pain, hoarseness, nasal congestion, sinus pressure and sore throat. No headaches; tinnitus, drooling, or problem swallowing. Cardiovascular: Negative for chest pain, palpitations, diaphoresis, dyspnea and peripheral edema. ; No orthopnea, PND Respiratory: Negative for cough, hemoptysis, wheezing and stridor. No pleuritic chestpain. Gastrointestinal: Negative for melena, blood in stool, hematemesis, jaundice and rectal bleeding.    Genitourinary: Negative for  frequency, dysuria, incontinence,flank pain and hematuria; Musculoskeletal: Negative for back pain and neck pain. Negative for swelling and trauma.;  Skin: . Negative for pruritus, rash, abrasions, bruising and skin lesion.; ulcerations Neuro: Negative for headache, lightheadedness and neck stiffness. Negative for weakness, altered level of consciousness , altered mental status, extremity weakness, burning feet, involuntary movement, seizure and syncope.  Psych: negative for anxiety, depression, insomnia, tearfulness, panic attacks, hallucinations, paranoia, suicidal or homicidal ideation    Past Medical History  Diagnosis Date  . CAD (coronary artery disease)     2 vessel intervention 2003  . Dyslipidemia   . CHF (congestive heart failure)     single episode  . Mitral regurgitation     mild prolapse anterior & posterior leaflets  . Aortic valve sclerosis     mild moderate calcification, echo, 2009  . HTN (hypertension)     difficult to obtain BP at times.  [name] Kitchen Urethral trauma     bleeding in hospital  . Rheumatoid arthritis     severe - deforming  . Lung disease, interstitial     related to rheumatoid arthritis, also question  of left apical nodule...followed elsewhere..my understanding stabilized  . Carotid artery disease     doppler 12/25/2010,  0-39% bilateral  . Ejection fraction     EF 60%, echo, 11/2007  . Clot     ??? apical clot in the past ?? no longer an issue  . Drug therapy     Prednisone.  . Depression   . GERD (gastroesophageal reflux disease)   . Cataract     Past Surgical History  Procedure Date  .  Appendectomy   . Breast surgery     Medications:  HOME MEDS: Prior to Admission medications   Medication Sig Start Date End Date Taking? Authorizing Provider  acetaminophen (TYLENOL) 500 MG tablet Take 1,000 mg by mouth every 6 (six) hours as needed. pain   Yes Historical Provider, MD  amitriptyline (ELAVIL) 50 MG tablet Take 1 tablet (50 mg total) by mouth  at bedtime. 05/27/11 05/26/12 Yes Luis Abed, MD  aspirin-acetaminophen-caffeine (EXCEDRIN EXTRA STRENGTH) [phone] MG per tablet Take 2 tablets by mouth every 6 (six) hours as needed.   Yes Historical Provider, MD  atorvastatin (LIPITOR) 20 MG tablet Take 1 tablet (20 mg total) by mouth daily. 08/18/11 08/17/12 Yes Luis Abed, MD  Calcium Carbonate-Vitamin D (CALCIUM 600+D) 600-400 MG-UNIT per tablet Take 1 tablet by mouth daily.     Yes Historical Provider, MD  esomeprazole (NEXIUM) 40 MG capsule Take 1 capsule (40 mg total) by mouth daily before breakfast. 04/26/11 04/25/12 Yes Luis Abed, MD  leflunomide (ARAVA) 20 MG tablet Take 20 mg by mouth daily.     Yes Historical Provider, MD  metoprolol succinate (TOPROL XL) 100 MG 24 hr tablet Take 1 tablet (100 mg total) by mouth daily. 05/31/11 05/30/12 Yes Luis Abed, MD  Multiple Vitamins-Minerals (EYE VITAMINS PO) Take 1 tablet by mouth daily.    Yes Historical Provider, MD  predniSONE (DELTASONE) 5 MG tablet Take 5 mg by mouth daily.     Yes Historical Provider, MD     Allergies:  No Known Allergies  Social History:   reports that she has never smoked. She has never used smokeless tobacco. She reports that she does not drink alcohol or use illicit drugs.  Family History: Family History  Problem Relation Age of Onset  . Heart disease Mother   . Heart disease Father      Physical Exam: Filed Vitals:   02/02/12 2045 02/02/12 2115 02/02/12 2205 02/02/12 2215  BP:   157/71   Pulse: 124  125   Temp:    98.2 F (36.8 C)  TempSrc:    Oral  Resp: 33 32 30   SpO2: 100%  97%    Blood pressure 157/71, pulse 125, temperature 98.2 F (36.8 C), temperature source Oral, resp. rate 30, SpO2 97.00%.  GEN:  Pleasant patient lying in the stretcher in no acute distress; cooperative with exam. PSYCH:  alert and oriented x4; does not appear anxious or depressed; affect is appropriate. HEENT: Mucous membranes pink and anicteric; PERRLA;  EOM intact; no cervical lymphadenopathy nor thyromegaly or carotid bruit; no JVD; There were no stridor. Neck is very supple. Breasts:: Not examined CHEST WALL: No tenderness CHEST: Normal respiration, clear to auscultation bilaterally.  HEART: Regular rate and rhythm.  There are no murmur, rub, or gallops.   BACK: No kyphosis or scoliosis; no CVA tenderness ABDOMEN: soft and tender in the RUQ,  no masses, no organomegaly, normal abdominal bowel sounds; no pannus; no intertriginous candida. There is no rebound and no distention. Rectal Exam: Not done EXTREMITIES: No bone or joint deformity; age-appropriate arthropathy of the hands and knees; no edema; no ulcerations.  There is no calf tenderness. Genitalia: not examined PULSES: 2+ and symmetric SKIN: Normal hydration no rash or ulceration CNS: Cranial nerves 2-12 grossly intact no focal lateralizing neurologic deficit.  Speech is fluent; uvula elevated with phonation, facial symmetry and tongue midline. DTR are normal bilaterally, cerebella exam is intact, barbinski is negative and strengths are  equaled bilaterally.  No sensory loss.   Labs on Admission:  Basic Metabolic Panel:  Lab 02/02/12 1478  NA 135  K 3.9  CL 98  CO2 25  GLUCOSE 101*  BUN 13  CREATININE 0.50  CALCIUM 9.3  MG 2.0  PHOS --   Liver Function Tests:  Lab 02/02/12 1630  AST 41*  ALT 11  ALKPHOS 83  BILITOT 0.5  PROT 7.0  ALBUMIN 3.6    Lab 02/02/12 1630  LIPASE 36  AMYLASE --   No results found for this basename: AMMONIA:5 in the last 168 hours CBC:  Lab 02/02/12 1630 02/02/12 1516  WBC 11.6* 12.8*  NEUTROABS 9.2* --  HGB 11.9* 12.5  HCT 36.9 41.7  MCV 97.6 100.4*  PLT 185 --   Cardiac Enzymes:  Lab 02/02/12 2015 02/02/12 1630  CKTOTAL -- --  CKMB -- --  CKMBINDEX -- --  TROPONINI <0.30 <0.30    CBG: No results found for this basename: GLUCAP:5 in the last 168 hours   Radiological Exams on Admission: Ct Abdomen Pelvis Wo  Contrast  02/02/2012  *RADIOLOGY REPORT*  Clinical Data: Abdominal pain, evaluate hiatal hernia, chest pain, decreased oxygen saturation, nausea for 1  CT CHEST, ABDOMEN AND PELVIS WITHOUT CONTRAST  Technique:  Multidetector CT imaging of the chest, abdomen and pelvis was performed following the standard protocol without IV contrast.  Comparison:  CT chest, abdomen pelvis - 03/16/2009; gallbladder ultrasound - earlier same day  CT CHEST  Findings:  Previously noted large hiatal hernia previously contained the stomach as well as a short segments of transverse colon.  The large hiatal hernia and now contains the entirety of a markedly distended stomach.  There is no definitive evidence of obstruction, as ingested enteric contrast passes through the stomach and into the duodenum and proximal small bowel.  The large hiatal hernia is again noted to result in mass effect upon the heart, in particular the left atrium.  Normal heart size.  Coronary artery calcifications.  Calcifications of the mitral valve annulus and within the aortic aortic valve leaflets.  No pericardial effusion.  Symmetric dependent subpleural ground-glass atelectasis.  No focal airspace opacities.  No pleural effusion or pneumothorax.  The central pulmonary airways are widely patent.  No definite mediastinal, hilar or axillary lymphadenopathy.  No acute or aggressive osseous abnormalities within the chest.  IMPRESSION: The entirety of a markedly distended stomach is seen within the large hiatal hernia.  This does not result in enteric obstruction as ingested enteric contrast passes through the stomach and is seen within the duodenum and proximal small bowel.  CT ABDOMEN AND PELVIS  Findings:  The lack of intravenous contrast limits the ability to evaluate solid abdominal organs.  Normal hepatic contour. There is a large (approximately 1.5 x 1.7 cm stone within the neck of a distended gallbladder.  There is a minimal amount of apparent gallbladder  wall thickening (measuring approximately 7 mm in diameter (image 41, series 2).  These findings are also associated with a minimal amount of apparent pericholecystic fluid on this noncontrast examination.  The common bile duct is dilated measuring approximately 9 mm in short axis diameter (image 35, series 2).  No definite intrahepatic biliary ductal dilatation on this noncontrast examination. There is a trace amount of fluid adjacent to the inferior aspect of the right lobe of the liver.  Normal noncontrast appearance of the bilateral kidneys.  Possible vascular calcification within the right renal hilum.  No definite renal stones  or evidence of urinary obstruction.  No definite perinephric stranding.  Normal noncontrast appearance of the bilateral adrenal glands, pancreas and spleen.  Large hiatal hernia as described in the preceding chest CT.  The rectum and distal sigmoid colon is mildly patulous.  The bowel is otherwise normal in course and caliber without wall thickening or evidence of obstruction.  No pneumoperitoneum, pneumatosis or portal venous gas.  Moderate amount of calcified atherosclerotic plaque within a mildly tortuous and normal caliber abdominal aorta.  Right external iliac pelvic sidewall lymphadenopathy has increased in the interval with index no conglomeration now measuring approximately 4.6 x 2.4 cm (image 65, series 2, previously, 4.3 x 2.3 cm.  Interval development of both the right inguinal lymphadenopathy with index no conglomeration now measuring approximately 4.1 x 2.5 cm.  No definite retroperitoneal or mesenteric adenopathy on this noncontrast examination.  Post hysterectomy.  No discrete adnexal lesion.  No free fluid in the pelvis.  IMPRESSION:  1.  Cholelithiasis with findings worrisome for acute cholecystitis. Further evaluation with a HIDA scan may be performed as clinically indicated.  2. Interval progression of right external iliac and inguinal lymphadenopathy again worrisome  for malignancy.  If not previously performed, percutaneous sampling may be obtained as indicated.  Above findings discussed with Dr. Rhunette Croft at 2039.   Original Report Authenticated By: Tacey Ruiz, MD    US Abdomen Complete  02/02/2012  **ADDENDUM** CREATED: 02/02/2012 20:43:18  In hind sight, there is a minimal amount of gallbladder wall thickening seen on images 75-77.  Again, this finding is without associated pericholecystic fluid or a sonographic Murphy's sign and remains indeterminate for acute cholecystitis.  Further evaluation with a HIDA scan may be performed as clinically indicated.  **END ADDENDUM** SIGNED BY: Dyanne Carrel, MD   02/02/2012  *RADIOLOGY REPORT*  Clinical Data:  Abdominal pain  COMPLETE ABDOMINAL ULTRASOUND  Comparison:  CT abdomen pelvis - 03/16/2009  Findings:  Gallbladder:  There are at least three shadowing echogenic gallstones within an otherwise normal-appearing gallbladder, the largest of which measures approximately 2 cm in diameter.  No gallbladder wall thickening or pericholecystic fluid.  Negative sonographic Murphy's sign.  Common bile duct:  Normal in size for age, measuring approximately 5.4 mm in diameter.  Liver:  There is mild diffuse slightly coarsened heterogeneous echotexture of the hepatic parenchyma.  No discrete intrahepatic lesions.  No intrahepatic biliary duct dilatation.  No ascites.  IVC:  Appears normal.  Pancreas:  Limited visualization of the pancreatic head and neck is normal.  Visualization of the pancreatic body and tail is obscured by bowel gas.  Spleen:  Normal in size measuring 6.8 cm in length.  Right Kidney:  Normal cortical thickness, echogenicity and size, measuring 8.8 cm in length.  No focal renal lesions.  No echogenic renal stones.  No urinary obstruction.  Left Kidney:  Normal cortical thickness, echogenicity and size, measuring 10.6 cm in length.  No focal renal lesions.  No echogenic renal stones.  No urinary obstruction.   Abdominal aorta:  No aneurysm identified.  IMPRESSION: 1.  Cholelithiasis without evidence of cholecystitis.  Note, gallstones were seen on remote abdominal CT performed 03/2009.  2. Mild nonspecific coarsening of the hepatic parenchymal echotexture without discrete hepatic lesion.  Original Report Authenticated By: Tacey Ruiz, MD    Dg Abd 2 Views  02/02/2012  *RADIOLOGY REPORT*  Clinical Data: Right upper quadrant abdominal pain, shortness of breath  ABDOMEN - 2 VIEW  Comparison: None.  Findings:  There is a large air and fluid containing retrocardiac structure which occupies the majority of the right lower hemithorax favored to represent a large hiatal hernia.  In the absence of prior examinations, the stability of this finding is indeterminate.  There is a minimal amount of distal colonic gas.  Otherwise, there is a paucity of colonic gas.  No definite pneumoperitoneum, pneumatosis or portal venous gas.  Vascular calcifications.  Moderate to severe multilevel thoracolumbar spine degenerative change.  IMPRESSION: Large hiatal hernia containing the majority of the stomach.  In the absence of prior examinations, the stability of this finding is indeterminate.  In the setting of acute right upper quadrant abdominal pain, a gastric volvulus could have a similar appearance. Clinical correlation is advised.   Original Report Authenticated By: Tacey Ruiz, MD     Assessment/Plan Present on Admission:  . Acute cholecystitis . Lung disease, interstitial . Rheumatoid arthritis . Mitral regurgitation . CAD (coronary artery disease) . CHF (congestive heart failure) . Aortic valve sclerosis . Hiatal hernia . Choledocholithiasis with acute cholecystitis  PLAN:  She appears to present with choledocholithiasis with acute cholecystitis.  Given her age and comorbidity, I would avoid surgery if at all possible, but will defer to surgery.  In the interim, will give her clear liquid, IV antibiotics, IVF and  analgesics.  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  I will give her stress dose steroids along with increasing her Prednisone to 40mg  per day.  I will hold her Arava during this infectious process.  She will be admitted to the SDU under [facility] service and is a FULL CODE.  Surgery will follow and make further recommendation.  Further therapy will depend on her response.  Thank you for allowing me to partake in the care of your lovely patient.  Other plans as per orders.  Code Status: FULL Unk Lightning, MD. [facility] Pager [phone] 7pm to 7am.  02/02/2012, 10:22 PM

## 2012-02-02 NOTE — ED Notes (Signed)
Pt placed on 2L of O2 due to oxygen level being reported low, but did not have good form. Pt has curved fingers that makes it hard to pick up a good reading.

## 2012-02-02 NOTE — Progress Notes (Signed)
[facility] [address], [place] [address] [phone]  Date:  02/02/2012   Name:  [name]   DOB:  February 09, 1926   MRN:  [id number]  PCP:  No primary provider on file.    Chief Complaint: Nausea   History of Present Illness:  [name] is a 78 y.o. very pleasant female patient who presents with the following:  History of CAD, sees DR. Myrtis Ser and had a follow- up appt last month.  She has stable CAD, aortic valve stenosis and mitral regurg, HTN, and severe RA.    She notes right sided abdominal upper pain and nausea since last night.  Started around the time that she went to bed.   She has no vomited.  She has a history of hiatal hernia.  No diarrhea.  She has not eaten anything today, but has been able to drink some liquids.    She cannot recall having this in the past- however her son reports that she has had this a few times in the past.  No recent changes in her diet or medication regimens   She is s/p appendectomy but does still have her gallbladder.   No one else sick with similar symptoms.  No suspicious foods.  She does not note CP, but she does feel tender in the epigastric atea.   Patient Active Problem List  Diagnosis  . HYPERTENSION  . Mitral regurgitation  . Carotid artery disease  . Drug therapy  . Clot  . Ejection fraction  . Lung disease, interstitial  . Aortic valve sclerosis  . CHF (congestive heart failure)  . Dyslipidemia  . CAD (coronary artery disease)  . Rheumatoid arthritis    Past Medical History  Diagnosis Date  . CAD (coronary artery disease)     2 vessel intervention 2003  . Dyslipidemia   . CHF (congestive heart failure)     single episode  . Mitral regurgitation     mild prolapse anterior & posterior leaflets  . Aortic valve sclerosis     mild moderate calcification, echo, 2009  . HTN (hypertension)     difficult to obtain BP at times.  [name] Kitchen Urethral trauma     bleeding in hospital  . Rheumatoid  arthritis     severe - deforming  . Lung disease, interstitial     related to rheumatoid arthritis, also question  of left apical nodule...followed elsewhere..my understanding stabilized  . Carotid artery disease     doppler 12/25/2010,  0-39% bilateral  . Ejection fraction     EF 60%, echo, 11/2007  . Clot     ??? apical clot in the past ?? no longer an issue  . Drug therapy     Prednisone.  . Depression   . GERD (gastroesophageal reflux disease)   . Cataract     Past Surgical History  Procedure Date  . Appendectomy   . Breast surgery     History  Substance Use Topics  . Smoking status: Never Smoker   . Smokeless tobacco: Not on file  . Alcohol Use: No    Family History  Problem Relation Age of Onset  . Heart disease Mother   . Heart disease Father     No Known Allergies  Medication list has been reviewed and updated.  Current Outpatient Prescriptions on File Prior to Visit  Medication Sig Dispense Refill  . acetaminophen (TYLENOL) 500 MG tablet Take 500 mg by mouth every 6 (  six) hours as needed.        [name] Kitchen amitriptyline (ELAVIL) 50 MG tablet Take 1 tablet (50 mg total) by mouth at bedtime.  30 tablet  0  . atorvastatin (LIPITOR) 20 MG tablet Take 1 tablet (20 mg total) by mouth daily.  30 tablet  4  . Calcium Carbonate-Vitamin D (CALCIUM 600+D) 600-400 MG-UNIT per tablet Take 1 tablet by mouth daily.        [name] Kitchen esomeprazole (NEXIUM) 40 MG capsule Take 1 capsule (40 mg total) by mouth daily before breakfast.  30 capsule  9  . leflunomide (ARAVA) 20 MG tablet Take 20 mg by mouth daily.        . metoprolol succinate (TOPROL XL) 100 MG 24 hr tablet Take 1 tablet (100 mg total) by mouth daily.  90 tablet  2  . Multiple Vitamins-Minerals (EYE VITAMINS PO) Take by mouth daily.        . predniSONE (DELTASONE) 5 MG tablet Take 5 mg by mouth daily.          Review of Systems:  As per HPI- otherwise negative.  Physical Examination: Filed Vitals:   02/02/12 1417  BP:  110/60  Pulse: 85  Temp: 98 F (36.7 C)  Resp: 16   Filed Vitals:   02/02/12 1417  Height: 5' 1.5" (1.562 m)  Weight: 103 lb 3.2 oz (46.811 kg)   Body mass index is 19.18 kg/(m^2). Ideal Body Weight: Weight in (lb) to have BMI = 25: 134.2   GEN: WDWN, NAD, Non-toxic, A & O x 3, thin and frail appearance HEENT: Atraumatic, Normocephalic. Neck supple. No masses, No LAD. Ears and Nose: No external deformity. CV: RRR, No M/G/R. No JVD. No thrill. No extra heart sounds. PULM: CTA B, no wheezes, crackles, rhonchi. No retractions. No resp. distress. No accessory muscle use. ABD: S, ND, +BS. No rebound. No HSM.  She has RUQ tenderness and a positive murphy's sign EXTR: No c/c/e, severe changes of RA with ulnar deviation both hands.   NEURO Normal gait.  PSYCH: Normally interactive. Conversant. Not depressed or anxious appearing.  Calm demeanor.   [facility] reading (PRIMARY) by  Dr. Patsy Lager. Abdominal series: no free air.  Large hiatal hernia, severe degenerative changes of the lumbar spine ABDOMEN - 2 VIEW  Comparison: None.  Findings:  There is a large air and fluid containing retrocardiac structure which occupies the majority of the right lower hemithorax favored to represent a large hiatal hernia. In the absence of prior examinations, the stability of this finding is indeterminate.  There is a minimal amount of distal colonic gas. Otherwise, there is a paucity of colonic gas. No definite pneumoperitoneum, pneumatosis or portal venous gas.  Vascular calcifications.  Moderate to severe multilevel thoracolumbar spine degenerative change.  IMPRESSION: Large hiatal hernia containing the majority of the stomach. In the absence of prior examinations, the stability of this finding is indeterminate. In the setting of acute right upper quadrant abdominal pain, a gastric volvulus could have a similar appearance. Clinical correlation is advised.  Results for orders placed in visit on  02/02/12  GLUCOSE, POCT (MANUAL RESULT ENTRY)      Component Value Range   POC Glucose 103 (*) 70 - 99 mg/dl  POCT UA - MICROSCOPIC ONLY      Component Value Range   WBC, Ur, HPF, POC 10-16     RBC, urine, microscopic 0-1     Bacteria, U Microscopic 4+     Mucus, UA negative  Epithelial cells, urine per micros 2-3     Crystals, Ur, HPF, POC negative     Casts, Ur, LPF, POC negative     Yeast, UA negative    POCT URINALYSIS DIPSTICK      Component Value Range   Color, UA yellow     Clarity, UA turbid     Glucose, UA negative     Bilirubin, UA negative     Ketones, UA trace     Spec Grav, UA 1.025     Blood, UA negative     pH, UA 7.0     Protein, UA 30     Urobilinogen, UA 0.2     Nitrite, UA positive     Leukocytes, UA small (1+)    POCT CBC      Component Value Range   WBC 12.8 (*) 4.6 - 10.2 K/uL   Lymph, poc 1.7  0.6 - 3.4   POC LYMPH PERCENT 13.6  10 - 50 %L   MID (cbc) 1.3 (*) 0 - 0.9   POC MID % 9.9  0 - 12 %M   POC Granulocyte 9.8 (*) 2 - 6.9   Granulocyte percent 76.5  37 - 80 %G   RBC 4.15  4.04 - 5.48 M/uL   Hemoglobin 12.5  12.2 - 16.2 g/dL   HCT, POC 57.8  46.9 - 47.9 %   MCV 100.4 (*) 80 - 97 fL   MCH, POC 30.1  27 - 31.2 pg   MCHC 30.0 (*) 31.8 - 35.4 g/dL   RDW, POC 62.9     Platelet Count, POC 198  142 - 424 K/uL   MPV 8.2  0 - 99.8 fL     Assessment and Plan: 1. Abdominal  pain, other specified site  POCT glucose (manual entry), POCT UA - Microscopic Only, POCT urinalysis dipstick, DG Abd 2 Views, POCT CBC  2. Nausea    3. Bacteria in urine  Urine culture   RUQ pain with an elevated WBC count and left shift.  Per her son she has had a few episodes like this in the past.  She may have acute cholecystitis- also see report re: her CXR that shows concern for a gastric volvulus (she is known to have a hiatal hernia).  Referral to [facility] ED for further evaluation- appreciate their care of this nice patient.  I have ordered a urine culture and will  follow- up the result.  Her son will drive her to the ED   Abbe Amsterdam, MD

## 2012-02-02 NOTE — ED Notes (Signed)
Pt presents from Urgent Care w/ onset of RUQ pain onset last pm. Pain is up under right rib cage, nausea w/o emesis. Pt states she would feel better if she could vomit d/t she has a large hiatal hernia.

## 2012-02-02 NOTE — ED Notes (Signed)
MD at bedside. 

## 2012-02-02 NOTE — Consult Note (Signed)
Reason for Consult:abdominal pain, large hiatal hernia Referring Physician: Dr Rhunette Croft EDP  [name] is an 78 y.o. female.  HPI: I was asked to evaluate this patient due to chest and abdominal pain. She is an 78 year old female with multiple significant medical problems as detailed below. She was in her usual state of health until last night when she had the onset of abdominal and chest pain which she localizes in her right upper quadrant and lower right chest. This was associated with nausea but no vomiting. She presented to an urgent care facility today and was sent to the emergency room for further evaluation. I've been asked to see the patient due to plain abdominal films and chest x-ray which showed a very large hiatal hernia. The patient states she has had a known hiatal hernia for many years and has been evaluated in the past by [facility]. She does have some chronic reflux symptoms that are pretty well controlled on Nexium. She has some occasional similar pain to what she is having now but it is been brief and much less severe. She states currently is on seeing her in the emergency department that she is feeling at least 50% better and wants to go home. She has some chronic constipation which is unchanged. No fever or chills. She has however felt cold.  Past Medical History  Diagnosis Date  . CAD (coronary artery disease)     2 vessel intervention 2003  . Dyslipidemia   . CHF (congestive heart failure)     single episode  . Mitral regurgitation     mild prolapse anterior & posterior leaflets  . Aortic valve sclerosis     mild moderate calcification, echo, 2009  . HTN (hypertension)     difficult to obtain BP at times.  [name] Kitchen Urethral trauma     bleeding in hospital  . Rheumatoid arthritis     severe - deforming  . Lung disease, interstitial     related to rheumatoid arthritis, also question  of left apical nodule...followed elsewhere..my understanding stabilized  . Carotid  artery disease     doppler 12/25/2010,  0-39% bilateral  . Ejection fraction     EF 60%, echo, 11/2007  . Clot     ??? apical clot in the past ?? no longer an issue  . Drug therapy     Prednisone.  . Depression   . GERD (gastroesophageal reflux disease)   . Cataract     Past Surgical History  Procedure Date  . Appendectomy   . Breast surgery     Family History  Problem Relation Age of Onset  . Heart disease Mother   . Heart disease Father     Social History:  reports that she has never smoked. She has never used smokeless tobacco. She reports that she does not drink alcohol or use illicit drugs.  Allergies: No Known Allergies  No current facility-administered medications for this encounter.   Current Outpatient Prescriptions  Medication Sig Dispense Refill  . acetaminophen (TYLENOL) 500 MG tablet Take 1,000 mg by mouth every 6 (six) hours as needed. pain      . amitriptyline (ELAVIL) 50 MG tablet Take 1 tablet (50 mg total) by mouth at bedtime.  30 tablet  0  . aspirin-acetaminophen-caffeine (EXCEDRIN EXTRA STRENGTH) 250-250-65 MG per tablet Take 2 tablets by mouth every 6 (six) hours as needed.      [name] Kitchen atorvastatin (LIPITOR) 20 MG tablet Take 1 tablet (20 mg total) by  mouth daily.  30 tablet  4  . Calcium Carbonate-Vitamin D (CALCIUM 600+D) 600-400 MG-UNIT per tablet Take 1 tablet by mouth daily.        [name] Kitchen esomeprazole (NEXIUM) 40 MG capsule Take 1 capsule (40 mg total) by mouth daily before breakfast.  30 capsule  9  . leflunomide (ARAVA) 20 MG tablet Take 20 mg by mouth daily.        . metoprolol succinate (TOPROL XL) 100 MG 24 hr tablet Take 1 tablet (100 mg total) by mouth daily.  90 tablet  2  . Multiple Vitamins-Minerals (EYE VITAMINS PO) Take 1 tablet by mouth daily.       . predniSONE (DELTASONE) 5 MG tablet Take 5 mg by mouth daily.           Results for orders placed during the hospital encounter of 02/02/12 (from the past 48 hour(s))  CBC WITH DIFFERENTIAL      Status: Abnormal   Collection Time   02/02/12  4:30 PM      Component Value Range Comment   WBC 11.6 (*) 4.0 - 10.5 K/uL    RBC 3.78 (*) 3.87 - 5.11 MIL/uL    Hemoglobin 11.9 (*) 12.0 - 15.0 g/dL    HCT 14.7  82.9 - 56.2 %    MCV 97.6  78.0 - 100.0 fL    MCH 31.5  26.0 - 34.0 pg    MCHC 32.2  30.0 - 36.0 g/dL    RDW 13.0  86.5 - 78.4 %    Platelets 185  150 - 400 K/uL    Neutrophils Relative 79 (*) 43 - 77 %    Neutro Abs 9.2 (*) 1.7 - 7.7 K/uL    Lymphocytes Relative 8 (*) 12 - 46 %    Lymphs Abs 0.9  0.7 - 4.0 K/uL    Monocytes Relative 11  3 - 12 %    Monocytes Absolute 1.2 (*) 0.1 - 1.0 K/uL    Eosinophils Relative 2  0 - 5 %    Eosinophils Absolute 0.2  0.0 - 0.7 K/uL    Basophils Relative 0  0 - 1 %    Basophils Absolute 0.0  0.0 - 0.1 K/uL   BASIC METABOLIC PANEL     Status: Abnormal   Collection Time   02/02/12  4:30 PM      Component Value Range Comment   Sodium 135  135 - 145 mEq/L    Potassium 3.9  3.5 - 5.1 mEq/L    Chloride 98  96 - 112 mEq/L    CO2 25  19 - 32 mEq/L    Glucose, Bld 101 (*) 70 - 99 mg/dL    BUN 13  6 - 23 mg/dL    Creatinine, Ser 6.96  0.50 - 1.10 mg/dL    Calcium 9.3  8.4 - 29.5 mg/dL    GFR calc non Af Amer 86 (*) >90 mL/min    GFR calc Af Amer >90  >90 mL/min   PROTIME-INR     Status: Normal   Collection Time   02/02/12  4:30 PM      Component Value Range Comment   Prothrombin Time 12.8  11.6 - 15.2 seconds    INR 0.97  0.00 - 1.49   APTT     Status: Normal   Collection Time   02/02/12  4:30 PM      Component Value Range Comment   aPTT 27  24 - 37 seconds  TROPONIN I     Status: Normal   Collection Time   02/02/12  4:30 PM      Component Value Range Comment   Troponin I <0.30  <0.30 ng/mL   MAGNESIUM     Status: Normal   Collection Time   02/02/12  4:30 PM      Component Value Range Comment   Magnesium 2.0  1.5 - 2.5 mg/dL     Dg Abd 2 Views  40/98/1191  *RADIOLOGY REPORT*  Clinical Data: Right upper quadrant abdominal  pain, shortness of breath  ABDOMEN - 2 VIEW  Comparison: None.  Findings:  There is a large air and fluid containing retrocardiac structure which occupies the majority of the right lower hemithorax favored to represent a large hiatal hernia.  In the absence of prior examinations, the stability of this finding is indeterminate.  There is a minimal amount of distal colonic gas.  Otherwise, there is a paucity of colonic gas.  No definite pneumoperitoneum, pneumatosis or portal venous gas.  Vascular calcifications.  Moderate to severe multilevel thoracolumbar spine degenerative change.  IMPRESSION: Large hiatal hernia containing the majority of the stomach.  In the absence of prior examinations, the stability of this finding is indeterminate.  In the setting of acute right upper quadrant abdominal pain, a gastric volvulus could have a similar appearance. Clinical correlation is advised.   Original Report Authenticated By: Tacey Ruiz, MD     Review of Systems  Constitutional: Negative for fever and chills.  Respiratory: Positive for shortness of breath. Negative for sputum production and wheezing.   Cardiovascular: Negative for chest pain, palpitations and leg swelling.  Gastrointestinal: Positive for heartburn, nausea, abdominal pain and constipation. Negative for vomiting, diarrhea, blood in stool and melena.   Blood pressure 122/41, pulse 92, temperature 97.3 F (36.3 C), temperature source Oral, resp. rate 14, SpO2 99.00%. Physical Exam General: Thin frail elderly Caucasian female in no acute distress Skin: Pale and somewhat atrophic without rash or infection HEENT: No palpable masses or thyromegaly. Sclera nonicteric. Oropharynx clear Lungs: Clear equal breath sounds without increased work of breathing Cardiovascular: 3/6 systolic murmur. Regular rate and rhythm. No edema or JVD. Abdomen: Nondistended. There is tenderness in the right upper quadrant with some guarding. No masses. No distention.  Remainder of her abdomen is soft and nontender. Extremities: Marked arthritic deformities of the hands and to a lesser degree other joints.no swelling or cyanosis Neurologic: She is alert and fully oriented. Affect normal. No motor deficits  Assessment/Plan: 78 year old female with acute right upper quadrant, right chest pain and nausea. She has a very large hiatal hernia. Volvulus of her stomach is the possibility although she is not having vomiting. She could also have multiple other possible etiologies including cholelithiasis/cholecystitis, peptic ulcer disease, colitis or others. I have recommended proceeding with a CT scan is initial evaluation to rule out gastric volvulus or obstruction. She would be a poor operative risk and if there is evidence of gastric volvulus I would recommend emergency upper endoscopy in an effort to de-torse the stomach and place an NG tube. If her CT scan shows only unremarkable large hiatal hernia that I think a gallbladder ultrasound would be the next reasonable step. Will follow.  Mirka Barbone T 02/02/2012, 5:45 PM

## 2012-02-02 NOTE — Patient Instructions (Addendum)
Please proceed to the [facility] Emergency Department.  I think you may have an infection in your gallbladder.    I have also ordered a culture of your urine to make sure there is no urinary tract infection

## 2012-02-02 NOTE — ED Notes (Signed)
Pt was AAOx4 during arrival. Noted HR change to 120s at 2000 which was reported to MD. Family member at bedside reported pt has altered mental status with more confusion. Family member states pt usually sleeps around this hour and does get confused. Pt able to state name, time, and son's name. Pt stated "home" when asked about current location. Informed Dr. Conley Rolls about pt condition. Dr. Conley Rolls recommended close monitoring and lopressor if HR does not drop.

## 2012-02-02 NOTE — ED Provider Notes (Addendum)
History     CSN: [id number]  Arrival date & time 02/02/12  1537   First MD Initiated Contact with Patient 02/02/12 1600      Chief Complaint  Patient presents with  . Abdominal Pain    (Consider location/radiation/quality/duration/timing/severity/associated sxs/prior treatment) HPI Comments: Pt comes in with cc of abd pain. Pt has hx of hiatal hernia, CAD and states that she started having some nausea last night along with RUQ and epigastric pain.  She went to an urgent care earlier today, was noted to have a massive hiatal hernia, and sent to the ER for further evaluation. Pt has no chest pain, sob.   Patient is a 78 y.o. female presenting with abdominal pain. The history is provided by the patient and medical records.  Abdominal Pain The primary symptoms of the illness include abdominal pain and nausea. The primary symptoms of the illness do not include fever, shortness of breath, vomiting or diarrhea.  Symptoms associated with the illness do not include constipation or hematuria.    Past Medical History  Diagnosis Date  . CAD (coronary artery disease)     2 vessel intervention 2003  . Dyslipidemia   . CHF (congestive heart failure)     single episode  . Mitral regurgitation     mild prolapse anterior & posterior leaflets  . Aortic valve sclerosis     mild moderate calcification, echo, 2009  . HTN (hypertension)     difficult to obtain BP at times.  [name] Kitchen Urethral trauma     bleeding in hospital  . Rheumatoid arthritis     severe - deforming  . Lung disease, interstitial     related to rheumatoid arthritis, also question  of left apical nodule...followed elsewhere..my understanding stabilized  . Carotid artery disease     doppler 12/25/2010,  0-39% bilateral  . Ejection fraction     EF 60%, echo, 11/2007  . Clot     ??? apical clot in the past ?? no longer an issue  . Drug therapy     Prednisone.  . Depression   . GERD (gastroesophageal reflux disease)   .  Cataract     Past Surgical History  Procedure Date  . Appendectomy   . Breast surgery     Family History  Problem Relation Age of Onset  . Heart disease Mother   . Heart disease Father     History  Substance Use Topics  . Smoking status: Never Smoker   . Smokeless tobacco: Never Used  . Alcohol Use: No    OB History    Grav Para Term Preterm Abortions TAB SAB Ect Mult Living                  Review of Systems  Constitutional: Positive for activity change and appetite change. Negative for fever.  HENT: Negative for facial swelling and neck pain.   Eyes: Negative for visual disturbance.  Respiratory: Negative for cough, shortness of breath and wheezing.   Cardiovascular: Negative for chest pain.  Gastrointestinal: Positive for nausea and abdominal pain. Negative for vomiting, diarrhea, constipation, blood in stool and abdominal distention.  Genitourinary: Negative for hematuria and difficulty urinating.  Musculoskeletal: Negative for arthralgias.  Skin: Negative for color change and rash.  Neurological: Negative for speech difficulty.  Hematological: Does not bruise/bleed easily.  Psychiatric/Behavioral: Negative for confusion.    Allergies  Review of patient's allergies indicates no known allergies.  Home Medications   Current Outpatient Rx  Name  Route  Sig  Dispense  Refill  . ACETAMINOPHEN 500 MG PO TABS   Oral   Take 1,000 mg by mouth every 6 (six) hours as needed. pain         . AMITRIPTYLINE HCL 50 MG PO TABS   Oral   Take 1 tablet (50 mg total) by mouth at bedtime.   30 tablet   0   . ASPIRIN-ACETAMINOPHEN-CAFFEINE 250-250-65 MG PO TABS   Oral   Take 2 tablets by mouth every 6 (six) hours as needed.         . ATORVASTATIN CALCIUM 20 MG PO TABS   Oral   Take 1 tablet (20 mg total) by mouth daily.   30 tablet   4   . CALCIUM CARBONATE-VITAMIN D 600-400 MG-UNIT PO TABS   Oral   Take 1 tablet by mouth daily.           [name] Kitchen ESOMEPRAZOLE  MAGNESIUM 40 MG PO CPDR   Oral   Take 1 capsule (40 mg total) by mouth daily before breakfast.   30 capsule   9   . LEFLUNOMIDE 20 MG PO TABS   Oral   Take 20 mg by mouth daily.           [name] Kitchen METOPROLOL SUCCINATE ER 100 MG PO TB24   Oral   Take 1 tablet (100 mg total) by mouth daily.   90 tablet   2     PT NEEDS OFFICE VISIT FOR ADDITIONAL REFILLS   . EYE VITAMINS PO   Oral   Take 1 tablet by mouth daily.          [name] Kitchen PREDNISONE 5 MG PO TABS   Oral   Take 5 mg by mouth daily.             BP 122/41  Pulse 92  Temp 97.3 F (36.3 C) (Oral)  Resp 14  SpO2 99%  Physical Exam  Nursing note and vitals reviewed. Constitutional: She is oriented to person, place, and time. She appears well-developed.  HENT:  Head: Normocephalic and atraumatic.  Eyes: Conjunctivae normal and EOM are normal. Pupils are equal, round, and reactive to light.  Neck: Normal range of motion. Neck supple. No JVD present.  Cardiovascular: Normal rate, regular rhythm and normal heart sounds.   Pulmonary/Chest: Effort normal and breath sounds normal. No respiratory distress.  Abdominal: Soft. Bowel sounds are normal. She exhibits no distension. There is tenderness. There is no rebound and no guarding.  Musculoskeletal: She exhibits no edema.  Neurological: She is alert and oriented to person, place, and time.  Skin: Skin is warm and dry.    ED Course  Procedures (including critical care time)  Labs Reviewed  CBC WITH DIFFERENTIAL - Abnormal; Notable for the following:    WBC 11.6 (*)     RBC 3.78 (*)     Hemoglobin 11.9 (*)     Neutrophils Relative 79 (*)     Neutro Abs 9.2 (*)     Lymphocytes Relative 8 (*)     Monocytes Absolute 1.2 (*)     All other components within normal limits  BASIC METABOLIC PANEL - Abnormal; Notable for the following:    Glucose, Bld 101 (*)     GFR calc non Af Amer 86 (*)     All other components within normal limits  PROTIME-INR  APTT  TROPONIN I   MAGNESIUM   Dg Abd 2 Views  02/02/2012  *RADIOLOGY REPORT*  Clinical Data: Right upper quadrant abdominal pain, shortness of breath  ABDOMEN - 2 VIEW  Comparison: None.  Findings:  There is a large air and fluid containing retrocardiac structure which occupies the majority of the right lower hemithorax favored to represent a large hiatal hernia.  In the absence of prior examinations, the stability of this finding is indeterminate.  There is a minimal amount of distal colonic gas.  Otherwise, there is a paucity of colonic gas.  No definite pneumoperitoneum, pneumatosis or portal venous gas.  Vascular calcifications.  Moderate to severe multilevel thoracolumbar spine degenerative change.  IMPRESSION: Large hiatal hernia containing the majority of the stomach.  In the absence of prior examinations, the stability of this finding is indeterminate.  In the setting of acute right upper quadrant abdominal pain, a gastric volvulus could have a similar appearance. Clinical correlation is advised.   Original Report Authenticated By: Tacey Ruiz, MD      No diagnosis found.    MDM   Date: 02/02/2012  Rate: 89  Rhythm: normal sinus rhythm  QRS Axis: normal  Intervals: normal  ST/T Wave abnormalities: nonspecific ST/T changes and ST depressions laterally  Conduction Disutrbances:none  Narrative Interpretation:   Old EKG Reviewed: none available  DDx includes: Pancreatitis Hepatobiliary pathology including cholecystitis Gastritis/PUD volvulus SBO ACS syndrome Aortic Dissection Colitis AAA Tumors Intra abdominal abscess Thrombosis Mesenteric ischemia Diverticulitis Peritonitis Appendicitis Hernia Nephrolithiasis Pyelonephritis UTI/Cystitis   Pt comes in with cc of epigastric and RUQ abd pain. She was sent to the ER from the urgent care as she had a large hiatal hernia. Her exam reveals ruq and epigastric tenderness, no peritoneal signs.  We consulted General surgery. Dr. Johna Sheriff  evaluated the patient, and plan was to get a CT scan w/ po contrast and [place] to evaluate for acute chole and volvulus. [place] was negative for GB dz, and her labs were WNL. The CT results just came in, and patient has findings concerning for acute chole. Dr. Johna Sheriff was contacted again. He doesn't think patient is a good candidate for operation at this time given her complicated cardiac hx. He recommend antibiotics and npo with medicine admission. Pt will need possibly percutaneous drainage.....  Findings discussed with patient. She is slightly tachycardic now, so with antibiotics, we will get sepsis workup as well.   Date: 02/02/2012  Rate: 89  Rhythm: normal sinus rhythm  QRS Axis: normal  Intervals: normal  ST/T Wave abnormalities: nonspecific ST/T changes and ST depressions laterally  Conduction Disutrbances:none  Narrative Interpretation:   Old EKG Reviewed: none available     Derwood Kaplan, MD 02/02/12 2101  Pt is tachycardic, tachypneic. Lactate is normal. She is septic at this time. She has good iv access...[name] KitchenMarland KitchenMarland Kitchen So no central line at this time after discussing the case with Dr. Conley Rolls. Stress dose steroids given.  CRITICAL CARE Performed by: Derwood Kaplan   Total critical care time: 45 minutes  Critical care time was exclusive of separately billable procedures and treating other patients.  Critical care was necessary to treat or prevent imminent or life-threatening deterioration.  Critical care was time spent personally by me on the following activities: development of treatment plan with patient and/or surrogate as well as nursing, discussions with consultants, evaluation of patient's response to treatment, examination of patient, obtaining history from patient or surrogate, ordering and performing treatments and interventions, ordering and review of laboratory studies, ordering and review of radiographic studies, pulse oximetry and re-evaluation of patient's  condition.  Derwood Kaplan, MD 02/02/12 7829  Derwood Kaplan, MD 02/15/12 5621

## 2012-02-02 NOTE — ED Notes (Signed)
Pt transported to CT ?

## 2012-02-03 ENCOUNTER — Inpatient Hospital Stay (HOSPITAL_COMMUNITY): Payer: Medicare Other

## 2012-02-03 DIAGNOSIS — K8020 Calculus of gallbladder without cholecystitis without obstruction: Secondary | ICD-10-CM | POA: Diagnosis present

## 2012-02-03 DIAGNOSIS — R590 Localized enlarged lymph nodes: Secondary | ICD-10-CM | POA: Diagnosis present

## 2012-02-03 DIAGNOSIS — K810 Acute cholecystitis: Secondary | ICD-10-CM | POA: Diagnosis present

## 2012-02-03 LAB — HEPATIC FUNCTION PANEL
ALT: 9 U/L (ref 0–35)
AST: 26 U/L (ref 0–37)
Albumin: 2.7 g/dL — ABNORMAL LOW (ref 3.5–5.2)
Alkaline Phosphatase: 69 U/L (ref 39–117)
Total Protein: 5.4 g/dL — ABNORMAL LOW (ref 6.0–8.3)

## 2012-02-03 LAB — TSH: TSH: 0.774 u[IU]/mL (ref 0.350–4.500)

## 2012-02-03 MED ORDER — PREDNISONE 5 MG PO TABS
5.0000 mg | ORAL_TABLET | Freq: Every day | ORAL | Status: DC
  Administered 2012-02-03 – 2012-02-08 (×6): 5 mg via ORAL
  Filled 2012-02-03 (×6): qty 1

## 2012-02-03 MED ORDER — TECHNETIUM TC 99M MEBROFENIN IV KIT
6.3000 | PACK | Freq: Once | INTRAVENOUS | Status: AC | PRN
  Administered 2012-02-03: 6 via INTRAVENOUS

## 2012-02-03 MED ORDER — MORPHINE SULFATE 2 MG/ML IJ SOLN
2.0000 mg | Freq: Once | INTRAMUSCULAR | Status: AC
  Administered 2012-02-03: 2 mg via INTRAVENOUS
  Filled 2012-02-03: qty 1

## 2012-02-03 NOTE — Progress Notes (Signed)
[facility] PROGRESS NOTE  AISHAH TEFFETELLER WUJ:[id number] DOB: Oct 22, 1925 DOA: 02/02/2012 PCP: No primary provider on file.  Brief narrative: [name] is an 78 year old female with a past medical history of coronary artery disease, aortic sclerosis, MR, CHF, RA on chronic steroids and hypertension who was admitted to the hospital on 02/02/2012 with acute cholecystitis.  Assessment/Plan: Principal Problem:  *RUQ pain, rule out acute cholecystitis  Admitted and surgical consultation obtained. Dr. Johna Sheriff evaluated her on 02/02/2012 and recommended proceeding with a CT scan to rule out other possible causes of her right upper quadrant pain including peptic ulcer disease, colitis, volvulus, and others. The CT scan did not show any evidence of cholecystitis but did show a large stone in the gallbladder and minimal gallbladder wall thickening. Additionally, the common bile duct was dilated.  Abdominal ultrasound also performed on 02/02/2012 which showed cholelithiasis without evidence of cholecystitis, and a negative sonographic Murphy's sign. Will likely need a HIDA scan. Continue n.p.o. status.  Given age and comorbidities, she is a poor operative candidate and with these negative radiographic findings, it is doubtful that she will be a surgical candidate at this point. Additionally, her LFTs are not elevated.  She is on empiric Unasyn, and has had a low-grade fever and slightly elevated white blood cell count, so I will continue this for now. Active Problems:  Mitral regurgitation /  Aortic valve sclerosis  Murmur heard on exam.  Lung disease, interstitial  No current complaints of shortness of breath.  CHF (congestive heart failure)  Clinically well compensated at present.  CAD (coronary artery disease)  Continue statin and beta blocker.  Rheumatoid arthritis on chronic steroids  Placed on stress dose steroids upon admission. Arava placed on hold.  No evidence of adrenal  crisis. Blood pressure stable. Discontinue stress dose steroids and resume home dose of prednisone.  Hiatal hernia  Continue Protonix.  Abdominal lymphadenopathy  Findings on CT scan worrisome for malignancy.  Code Status: Full Family Communication: None at bedside. Defers my offer to call family. Instructed to have nurse paged me if her family arrives in the clinic to speak with me. Disposition Plan: Home, when stable.   Medical Consultants:  Dr. Glenna Fellows, surgery  Other Consultants:  None.  Anti-infectives:  Unasyn 02/02/2012--->  HPI/Subjective: [name] denies dyspnea, chest pain, nausea, vomiting. She still has some right upper quadrant tenderness. Feels better than she did yesterday.  Objective: Filed Vitals:   02/03/12 0300 02/03/12 0400 02/03/12 0500 02/03/12 0600  BP: 116/56 110/48 113/48 119/52  Pulse: 111 109 108 110  Temp:  100.5 F (38.1 C)    TempSrc:  Oral    Resp: 30 27 29 29   Height:      Weight:      SpO2: 97% 99% 100% 99%    Intake/Output Summary (Last 24 hours) at 02/03/12 0707 Last data filed at 02/03/12 0643  Gross per 24 hour  Intake    650 ml  Output      0 ml  Net    650 ml    Exam: Gen:  NAD Cardiovascular:  RRR, 2/6 systolic ejection murmur Respiratory:  Lungs CTAB Gastrointestinal:  Abdomen soft, right upper quadrant tenderness Extremities:  No C/E/C  Data Reviewed: Basic Metabolic Panel:  Lab 02/02/12 9562  NA 135  K 3.9  CL 98  CO2 25  GLUCOSE 101*  BUN 13  CREATININE 0.50  CALCIUM 9.3  MG 2.0  PHOS --   GFR Estimated Creatinine Clearance:  39.4 ml/min (by C-G formula based on Cr of 0.5). Liver Function Tests:  Lab 02/03/12 0346 02/02/12 1630  AST 26 41*  ALT 9 11  ALKPHOS 69 83  BILITOT 0.4 0.5  PROT 5.4* 7.0  ALBUMIN 2.7* 3.6    Lab 02/02/12 1630  LIPASE 36  AMYLASE --   Coagulation profile  Lab 02/02/12 1630  INR 0.97  PROTIME --    CBC:  Lab 02/02/12 1630 02/02/12 1516  WBC  11.6* 12.8*  NEUTROABS 9.2* --  HGB 11.9* 12.5  HCT 36.9 41.7  MCV 97.6 100.4*  PLT 185 --   Cardiac Enzymes:  Lab 02/02/12 2015 02/02/12 1630  CKTOTAL -- --  CKMB -- --  CKMBINDEX -- --  TROPONINI <0.30 <0.30   Thyroid function studies  Basename 02/02/12 2015  TSH 0.774  T4TOTAL --  T3FREE --  THYROIDAB --   Microbiology Recent Results (from the past 240 hour(s))  MRSA PCR SCREENING     Status: Normal   Collection Time   02/02/12 11:03 PM      Component Value Range Status Comment   MRSA by PCR NEGATIVE  NEGATIVE Final      Procedures and Diagnostic Studies: Ct Abdomen Pelvis Wo Contrast  02/02/2012  *RADIOLOGY REPORT*  Clinical Data: Abdominal pain, evaluate hiatal hernia, chest pain, decreased oxygen saturation, nausea for 1  CT CHEST, ABDOMEN AND PELVIS WITHOUT CONTRAST  Technique:  Multidetector CT imaging of the chest, abdomen and pelvis was performed following the standard protocol without IV contrast.  Comparison:  CT chest, abdomen pelvis - 03/16/2009; gallbladder ultrasound - earlier same day  CT CHEST  Findings:  Previously noted large hiatal hernia previously contained the stomach as well as a short segments of transverse colon.  The large hiatal hernia and now contains the entirety of a markedly distended stomach.  There is no definitive evidence of obstruction, as ingested enteric contrast passes through the stomach and into the duodenum and proximal small bowel.  The large hiatal hernia is again noted to result in mass effect upon the heart, in particular the left atrium.  Normal heart size.  Coronary artery calcifications.  Calcifications of the mitral valve annulus and within the aortic aortic valve leaflets.  No pericardial effusion.  Symmetric dependent subpleural ground-glass atelectasis.  No focal airspace opacities.  No pleural effusion or pneumothorax.  The central pulmonary airways are widely patent.  No definite mediastinal, hilar or axillary  lymphadenopathy.  No acute or aggressive osseous abnormalities within the chest.  IMPRESSION: The entirety of a markedly distended stomach is seen within the large hiatal hernia.  This does not result in enteric obstruction as ingested enteric contrast passes through the stomach and is seen within the duodenum and proximal small bowel.  CT ABDOMEN AND PELVIS  Findings:  The lack of intravenous contrast limits the ability to evaluate solid abdominal organs.  Normal hepatic contour. There is a large (approximately 1.5 x 1.7 cm stone within the neck of a distended gallbladder.  There is a minimal amount of apparent gallbladder wall thickening (measuring approximately 7 mm in diameter (image 41, series 2).  These findings are also associated with a minimal amount of apparent pericholecystic fluid on this noncontrast examination.  The common bile duct is dilated measuring approximately 9 mm in short axis diameter (image 35, series 2).  No definite intrahepatic biliary ductal dilatation on this noncontrast examination. There is a trace amount of fluid adjacent to the inferior aspect of the right  lobe of the liver.  Normal noncontrast appearance of the bilateral kidneys.  Possible vascular calcification within the right renal hilum.  No definite renal stones or evidence of urinary obstruction.  No definite perinephric stranding.  Normal noncontrast appearance of the bilateral adrenal glands, pancreas and spleen.  Large hiatal hernia as described in the preceding chest CT.  The rectum and distal sigmoid colon is mildly patulous.  The bowel is otherwise normal in course and caliber without wall thickening or evidence of obstruction.  No pneumoperitoneum, pneumatosis or portal venous gas.  Moderate amount of calcified atherosclerotic plaque within a mildly tortuous and normal caliber abdominal aorta.  Right external iliac pelvic sidewall lymphadenopathy has increased in the interval with index no conglomeration now measuring  approximately 4.6 x 2.4 cm (image 65, series 2, previously, 4.3 x 2.3 cm.  Interval development of both the right inguinal lymphadenopathy with index no conglomeration now measuring approximately 4.1 x 2.5 cm.  No definite retroperitoneal or mesenteric adenopathy on this noncontrast examination.  Post hysterectomy.  No discrete adnexal lesion.  No free fluid in the pelvis.  IMPRESSION:  1.  Cholelithiasis with findings worrisome for acute cholecystitis. Further evaluation with a HIDA scan may be performed as clinically indicated.  2. Interval progression of right external iliac and inguinal lymphadenopathy again worrisome for malignancy.  If not previously performed, percutaneous sampling may be obtained as indicated.  Above findings discussed with Dr. Rhunette Croft at 2039.   Original Report Authenticated By: Tacey Ruiz, MD    US Abdomen Complete  02/02/2012  **ADDENDUM** CREATED: 02/02/2012 20:43:18  In hind sight, there is a minimal amount of gallbladder wall thickening seen on images 75-77.  Again, this finding is without associated pericholecystic fluid or a sonographic Murphy's sign and remains indeterminate for acute cholecystitis.  Further evaluation with a HIDA scan may be performed as clinically indicated.  **END ADDENDUM** SIGNED BY: Dyanne Carrel, MD   02/02/2012  *RADIOLOGY REPORT*  Clinical Data:  Abdominal pain  COMPLETE ABDOMINAL ULTRASOUND  Comparison:  CT abdomen pelvis - 03/16/2009  Findings:  Gallbladder:  There are at least three shadowing echogenic gallstones within an otherwise normal-appearing gallbladder, the largest of which measures approximately 2 cm in diameter.  No gallbladder wall thickening or pericholecystic fluid.  Negative sonographic Murphy's sign.  Common bile duct:  Normal in size for age, measuring approximately 5.4 mm in diameter.  Liver:  There is mild diffuse slightly coarsened heterogeneous echotexture of the hepatic parenchyma.  No discrete intrahepatic  lesions.  No intrahepatic biliary duct dilatation.  No ascites.  IVC:  Appears normal.  Pancreas:  Limited visualization of the pancreatic head and neck is normal.  Visualization of the pancreatic body and tail is obscured by bowel gas.  Spleen:  Normal in size measuring 6.8 cm in length.  Right Kidney:  Normal cortical thickness, echogenicity and size, measuring 8.8 cm in length.  No focal renal lesions.  No echogenic renal stones.  No urinary obstruction.  Left Kidney:  Normal cortical thickness, echogenicity and size, measuring 10.6 cm in length.  No focal renal lesions.  No echogenic renal stones.  No urinary obstruction.  Abdominal aorta:  No aneurysm identified.  IMPRESSION: 1.  Cholelithiasis without evidence of cholecystitis.  Note, gallstones were seen on remote abdominal CT performed 03/2009.  2. Mild nonspecific coarsening of the hepatic parenchymal echotexture without discrete hepatic lesion.  Original Report Authenticated By: Tacey Ruiz, MD    Dg Abd 2 Views  02/02/2012  *RADIOLOGY REPORT*  Clinical Data: Right upper quadrant abdominal pain, shortness of breath  ABDOMEN - 2 VIEW  Comparison: None.  Findings:  There is a large air and fluid containing retrocardiac structure which occupies the majority of the right lower hemithorax favored to represent a large hiatal hernia.  In the absence of prior examinations, the stability of this finding is indeterminate.  There is a minimal amount of distal colonic gas.  Otherwise, there is a paucity of colonic gas.  No definite pneumoperitoneum, pneumatosis or portal venous gas.  Vascular calcifications.  Moderate to severe multilevel thoracolumbar spine degenerative change.  IMPRESSION: Large hiatal hernia containing the majority of the stomach.  In the absence of prior examinations, the stability of this finding is indeterminate.  In the setting of acute right upper quadrant abdominal pain, a gastric volvulus could have a similar appearance. Clinical  correlation is advised.   Original Report Authenticated By: Tacey Ruiz, MD     Scheduled Meds:   . amitriptyline  50 mg Oral QHS  . ampicillin-sulbactam (UNASYN) IV  1.5 g Intravenous Q6H  . atorvastatin  20 mg Oral q1800  . calcium-vitamin D  1 tablet Oral Daily  . heparin  5,000 Units Subcutaneous Q8H  . hydrocortisone sod succinate (SOLU-CORTEF) injection  50 mg Intravenous Q8H  . metoprolol succinate  100 mg Oral Daily  . pantoprazole  40 mg Oral Daily  . predniSONE  40 mg Oral QAC breakfast  . sodium chloride  3 mL Intravenous Q12H   Continuous Infusions:   . dextrose 5 % and 0.9% NaCl 75 mL/hr at 02/02/12 2329    Time spent: 35 minutes.   LOS: 1 day   Bader Stubblefield  [facility] Pager [phone].  If 8PM-8AM, please contact night-coverage at [email or web address], password [facility] 02/03/2012, 7:07 AM

## 2012-02-03 NOTE — Progress Notes (Addendum)
Patient ID: [name], female   DOB: [birth year], 78 y.o.   MRN: [id number]    Subjective: Pt feeling much better she states.  No nausea or vomiting.  Objective: Vital signs in last 24 hours: Temp:  [97.3 F (36.3 C)-100.6 F (38.1 C)] 100.5 F (38.1 C) (12/26 0400) Pulse Rate:  [85-130] 110  (12/26 0600) Resp:  [14-33] 29  (12/26 0600) BP: (103-157)/(41-71) 119/52 mmHg (12/26 0600) SpO2:  [82 %-100 %] 99 % (12/26 0600) Weight:  [103 lb 3.2 oz (46.811 kg)-105 lb 2.6 oz (47.7 kg)] 105 lb 2.6 oz (47.7 kg) (12/26 0000) Last BM Date: 02/02/12  Intake/Output from previous day: 12/25 0701 - 12/26 0700 In: 650 [I.V.:600; IV Piggyback:50] Out: -  Intake/Output this shift:    PE: Abd: soft, minimal RUQ tenderness, +BS, ND Heart: tachy, + murmur Lungs: CTAB  Lab Results:   Basename 02/02/12 1630 02/02/12 1516  WBC 11.6* 12.8*  HGB 11.9* 12.5  HCT 36.9 41.7  PLT 185 --   BMET  Basename 02/02/12 1630  NA 135  K 3.9  CL 98  CO2 25  GLUCOSE 101*  BUN 13  CREATININE 0.50  CALCIUM 9.3   PT/INR  Basename 02/02/12 1630  LABPROT 12.8  INR 0.97   CMP     Component Value Date/Time   NA 135 02/02/2012 1630   K 3.9 02/02/2012 1630   CL 98 02/02/2012 1630   CO2 25 02/02/2012 1630   GLUCOSE 101* 02/02/2012 1630   BUN 13 02/02/2012 1630   CREATININE 0.50 02/02/2012 1630   CALCIUM 9.3 02/02/2012 1630   PROT 5.4* 02/03/2012 0346   ALBUMIN 2.7* 02/03/2012 0346   AST 26 02/03/2012 0346   ALT 9 02/03/2012 0346   ALKPHOS 69 02/03/2012 0346   BILITOT 0.4 02/03/2012 0346   GFRNONAA 86* 02/02/2012 1630   GFRAA >90 02/02/2012 1630   Lipase     Component Value Date/Time   LIPASE 36 02/02/2012 1630       Studies/Results: Ct Abdomen Pelvis Wo Contrast  02/02/2012  *RADIOLOGY REPORT*  Clinical Data: Abdominal pain, evaluate hiatal hernia, chest pain, decreased oxygen saturation, nausea for 1  CT CHEST, ABDOMEN AND PELVIS WITHOUT CONTRAST  Technique:  Multidetector  CT imaging of the chest, abdomen and pelvis was performed following the standard protocol without IV contrast.  Comparison:  CT chest, abdomen pelvis - 03/16/2009; gallbladder ultrasound - earlier same day  CT CHEST  Findings:  Previously noted large hiatal hernia previously contained the stomach as well as a short segments of transverse colon.  The large hiatal hernia and now contains the entirety of a markedly distended stomach.  There is no definitive evidence of obstruction, as ingested enteric contrast passes through the stomach and into the duodenum and proximal small bowel.  The large hiatal hernia is again noted to result in mass effect upon the heart, in particular the left atrium.  Normal heart size.  Coronary artery calcifications.  Calcifications of the mitral valve annulus and within the aortic aortic valve leaflets.  No pericardial effusion.  Symmetric dependent subpleural ground-glass atelectasis.  No focal airspace opacities.  No pleural effusion or pneumothorax.  The central pulmonary airways are widely patent.  No definite mediastinal, hilar or axillary lymphadenopathy.  No acute or aggressive osseous abnormalities within the chest.  IMPRESSION: The entirety of a markedly distended stomach is seen within the large hiatal hernia.  This does not result in enteric obstruction as ingested enteric contrast passes through  the stomach and is seen within the duodenum and proximal small bowel.  CT ABDOMEN AND PELVIS  Findings:  The lack of intravenous contrast limits the ability to evaluate solid abdominal organs.  Normal hepatic contour. There is a large (approximately 1.5 x 1.7 cm stone within the neck of a distended gallbladder.  There is a minimal amount of apparent gallbladder wall thickening (measuring approximately 7 mm in diameter (image 41, series 2).  These findings are also associated with a minimal amount of apparent pericholecystic fluid on this noncontrast examination.  The common bile duct is  dilated measuring approximately 9 mm in short axis diameter (image 35, series 2).  No definite intrahepatic biliary ductal dilatation on this noncontrast examination. There is a trace amount of fluid adjacent to the inferior aspect of the right lobe of the liver.  Normal noncontrast appearance of the bilateral kidneys.  Possible vascular calcification within the right renal hilum.  No definite renal stones or evidence of urinary obstruction.  No definite perinephric stranding.  Normal noncontrast appearance of the bilateral adrenal glands, pancreas and spleen.  Large hiatal hernia as described in the preceding chest CT.  The rectum and distal sigmoid colon is mildly patulous.  The bowel is otherwise normal in course and caliber without wall thickening or evidence of obstruction.  No pneumoperitoneum, pneumatosis or portal venous gas.  Moderate amount of calcified atherosclerotic plaque within a mildly tortuous and normal caliber abdominal aorta.  Right external iliac pelvic sidewall lymphadenopathy has increased in the interval with index no conglomeration now measuring approximately 4.6 x 2.4 cm (image 65, series 2, previously, 4.3 x 2.3 cm.  Interval development of both the right inguinal lymphadenopathy with index no conglomeration now measuring approximately 4.1 x 2.5 cm.  No definite retroperitoneal or mesenteric adenopathy on this noncontrast examination.  Post hysterectomy.  No discrete adnexal lesion.  No free fluid in the pelvis.  IMPRESSION:  1.  Cholelithiasis with findings worrisome for acute cholecystitis. Further evaluation with a HIDA scan may be performed as clinically indicated.  2. Interval progression of right external iliac and inguinal lymphadenopathy again worrisome for malignancy.  If not previously performed, percutaneous sampling may be obtained as indicated.  Above findings discussed with Dr. Rhunette Croft at 2039.   Original Report Authenticated By: Tacey Ruiz, MD    US Abdomen  Complete  02/02/2012  **ADDENDUM** CREATED: 02/02/2012 20:43:18  In hind sight, there is a minimal amount of gallbladder wall thickening seen on images 75-77.  Again, this finding is without associated pericholecystic fluid or a sonographic Murphy's sign and remains indeterminate for acute cholecystitis.  Further evaluation with a HIDA scan may be performed as clinically indicated.  **END ADDENDUM** SIGNED BY: Dyanne Carrel, MD   02/02/2012  *RADIOLOGY REPORT*  Clinical Data:  Abdominal pain  COMPLETE ABDOMINAL ULTRASOUND  Comparison:  CT abdomen pelvis - 03/16/2009  Findings:  Gallbladder:  There are at least three shadowing echogenic gallstones within an otherwise normal-appearing gallbladder, the largest of which measures approximately 2 cm in diameter.  No gallbladder wall thickening or pericholecystic fluid.  Negative sonographic Murphy's sign.  Common bile duct:  Normal in size for age, measuring approximately 5.4 mm in diameter.  Liver:  There is mild diffuse slightly coarsened heterogeneous echotexture of the hepatic parenchyma.  No discrete intrahepatic lesions.  No intrahepatic biliary duct dilatation.  No ascites.  IVC:  Appears normal.  Pancreas:  Limited visualization of the pancreatic head and neck is normal.  Visualization of the pancreatic body and tail is obscured by bowel gas.  Spleen:  Normal in size measuring 6.8 cm in length.  Right Kidney:  Normal cortical thickness, echogenicity and size, measuring 8.8 cm in length.  No focal renal lesions.  No echogenic renal stones.  No urinary obstruction.  Left Kidney:  Normal cortical thickness, echogenicity and size, measuring 10.6 cm in length.  No focal renal lesions.  No echogenic renal stones.  No urinary obstruction.  Abdominal aorta:  No aneurysm identified.  IMPRESSION: 1.  Cholelithiasis without evidence of cholecystitis.  Note, gallstones were seen on remote abdominal CT performed 03/2009.  2. Mild nonspecific coarsening of the  hepatic parenchymal echotexture without discrete hepatic lesion.  Original Report Authenticated By: Tacey Ruiz, MD    Dg Abd 2 Views  02/02/2012  *RADIOLOGY REPORT*  Clinical Data: Right upper quadrant abdominal pain, shortness of breath  ABDOMEN - 2 VIEW  Comparison: None.  Findings:  There is a large air and fluid containing retrocardiac structure which occupies the majority of the right lower hemithorax favored to represent a large hiatal hernia.  In the absence of prior examinations, the stability of this finding is indeterminate.  There is a minimal amount of distal colonic gas.  Otherwise, there is a paucity of colonic gas.  No definite pneumoperitoneum, pneumatosis or portal venous gas.  Vascular calcifications.  Moderate to severe multilevel thoracolumbar spine degenerative change.  IMPRESSION: Large hiatal hernia containing the majority of the stomach.  In the absence of prior examinations, the stability of this finding is indeterminate.  In the setting of acute right upper quadrant abdominal pain, a gastric volvulus could have a similar appearance. Clinical correlation is advised.   Original Report Authenticated By: Tacey Ruiz, MD     Anti-infectives: Anti-infectives     Start     Dose/Rate Route Frequency Ordered Stop   02/03/12 0600   ampicillin-sulbactam (UNASYN) 1.5 g in sodium chloride 0.9 % 50 mL IVPB        1.5 g 100 mL/hr over 30 Minutes Intravenous Every 6 hours 02/02/12 2306     02/02/12 2100   Ampicillin-Sulbactam (UNASYN) 3 g in sodium chloride 0.9 % 100 mL IVPB        3 g 100 mL/hr over 60 Minutes Intravenous  Once 02/02/12 2055 02/02/12 2310           Assessment/Plan  1. Cholelithiasis 2. Large hiatal hernia 3. CAD 4. RA, on chronic steroids  Plan: The patient's abdominal pain is much better and actually almost gone at this point.  She is not very fond of the idea of surgery and would like to avoid it if possible.  I think it's unclear whether the patient  has cholecystitis or not.  She definitely has cholelithiasis, which may be the source of her abdominal pain.  It does not appear that her hiatal hernia is the source right now.  We may have to get a HIDA scan to determine whether the patient truly has cholecystitis or not.  If she does not, and her pain resolves, she can likely avoid an operation.  However, if she has cholecystitis, she may require surgical intervention vs perc chole drain.  She will likely need cardiology to see her for cardiac clearance.  We will follow along.   LOS: 1 day    OSBORNE,[name] 02/03/2012, 7:42 AM Pager: [phone]  HIDA - non visualization of the gall bladder - cystic duct obstruction - c/w  cholecystitis. Options would be to do treat medically with antibiotics and follow up exams vs cholecystectomy on patient (with attendant risks in the patient with multiple medical issues) vs perc drain the gall bladder. She has one son, [name], but he is not at the bedside. Dr. Nathanial Ellis is her PCP, Dr. Myrtis Ellis is her cardiologist, and Dr. Kellie Ellis is her rheumatologist. She says she feels better now, but does have minimal localized pain in the RUQ. Will give full liquids for now. Note:  She also has some right inguinal/right iliac adenopathy which was there in 03/2009 - but increased since then.  No diagnosis that I am aware of.  Ovidio Kin, MD, [facility] Pager: [phone] Office phone:  [phone]

## 2012-02-04 ENCOUNTER — Other Ambulatory Visit: Payer: Self-pay

## 2012-02-04 ENCOUNTER — Encounter (HOSPITAL_COMMUNITY): Payer: Self-pay | Admitting: Physician Assistant

## 2012-02-04 DIAGNOSIS — I2510 Atherosclerotic heart disease of native coronary artery without angina pectoris: Secondary | ICD-10-CM

## 2012-02-04 DIAGNOSIS — I359 Nonrheumatic aortic valve disorder, unspecified: Secondary | ICD-10-CM

## 2012-02-04 DIAGNOSIS — Z01810 Encounter for preprocedural cardiovascular examination: Secondary | ICD-10-CM

## 2012-02-04 LAB — CA 125: CA 125: 32.2 U/mL — ABNORMAL HIGH (ref 0.0–30.2)

## 2012-02-04 LAB — URINE CULTURE

## 2012-02-04 LAB — CEA: CEA: 5 ng/mL (ref 0.0–5.0)

## 2012-02-04 MED ORDER — HEPARIN SODIUM (PORCINE) 5000 UNIT/ML IJ SOLN
5000.0000 [IU] | Freq: Three times a day (TID) | INTRAMUSCULAR | Status: DC
  Administered 2012-02-04 – 2012-02-08 (×11): 5000 [IU] via SUBCUTANEOUS
  Filled 2012-02-04 (×14): qty 1

## 2012-02-04 MED ORDER — HEPARIN SODIUM (PORCINE) 5000 UNIT/ML IJ SOLN
5000.0000 [IU] | Freq: Three times a day (TID) | INTRAMUSCULAR | Status: DC
  Filled 2012-02-04 (×3): qty 1

## 2012-02-04 NOTE — Progress Notes (Signed)
[facility] PROGRESS NOTE  [name] WUJ:[id number] DOB: 1925-02-24 DOA: 02/02/2012 PCP: Tula Nakayama, MD Cardiologist: Dr. Myrtis Ser  Brief narrative: Mrs. [name] is an 79 year old female with a past medical history of coronary artery disease, aortic sclerosis, MR, CHF, RA on chronic steroids and hypertension who was admitted to the hospital on 02/02/2012 with acute cholecystitis.  Cardiology consulted for cardiac clearance prior to possible surgery.  Assessment/Plan: Principal Problem:  *RUQ pain, rule out acute cholecystitis  Admitted and surgical consultation obtained. Dr. Johna Sheriff evaluated her on 02/02/2012.  A CT scan was obtained which did not show any evidence of cholecystitis but did show a large stone in the gallbladder and minimal gallbladder wall thickening. Additionally, the common bile duct was dilated.  Abdominal ultrasound also performed on 02/02/2012 which showed cholelithiasis without evidence of cholecystitis, and a negative sonographic Murphy's sign. HIDA scan subsequently done which did show cystic duct obstruction and acute cholecystitis.   Given age and comorbidities, she is a poor operative candidate and will need cardiac clearance prior to surgery.  Continue empiric Unasyn. Active Problems:  Mitral regurgitation /  Aortic valve sclerosis  Murmur heard on exam.  Lung disease, interstitial  No current complaints of shortness of breath.  CHF (congestive heart failure)  Clinically well compensated at present.  CAD (coronary artery disease)  Continue statin and beta blocker.  Rheumatoid arthritis on chronic steroids  Placed on stress dose steroids upon admission. Arava placed on hold.  No evidence of adrenal crisis. Blood pressure stable. Discontinue stress dose steroids and resume home dose of prednisone.  Hiatal hernia  Continue Protonix.  Abdominal lymphadenopathy  Findings on CT scan worrisome for malignancy.  Check CEA, CA  125.  Code Status: Full Family Communication: None at bedside.  Disposition Plan: Home, when stable.   Medical Consultants:  Dr. Glenna Fellows, surgery  Cardiology  Other Consultants:  None.  Anti-infectives:  Unasyn 02/02/2012--->  HPI/Subjective: Mrs. [name] denies dyspnea, chest pain, nausea, vomiting. States: "My stomach is sore". Febrile.  Objective: Filed Vitals:   02/04/12 0400 02/04/12 0500 02/04/12 0600 02/04/12 0653  BP: 131/62  106/49   Pulse: 114 114 113 110  Temp: 101 F (38.3 C)   98.3 F (36.8 C)  TempSrc: Oral   Oral  Resp: 30 20 29 25   Height:      Weight: 48.5 kg (106 lb 14.8 oz)     SpO2: 95% 95% 93% 95%    Intake/Output Summary (Last 24 hours) at 02/04/12 0713 Last data filed at 02/04/12 0600  Gross per 24 hour  Intake   2185 ml  Output   1150 ml  Net   1035 ml    Exam: Gen:  NAD Cardiovascular:  RRR, 2/6 systolic ejection murmur Respiratory:  Lungs CTAB Gastrointestinal:  Abdomen soft, right upper quadrant tenderness Extremities:  No C/E/C  Data Reviewed: Basic Metabolic Panel:  Lab 02/02/12 9562  NA 135  K 3.9  CL 98  CO2 25  GLUCOSE 101*  BUN 13  CREATININE 0.50  CALCIUM 9.3  MG 2.0  PHOS --   GFR Estimated Creatinine Clearance: 40.1 ml/min (by C-G formula based on Cr of 0.5). Liver Function Tests:  Lab 02/03/12 0346 02/02/12 1630  AST 26 41*  ALT 9 11  ALKPHOS 69 83  BILITOT 0.4 0.5  PROT 5.4* 7.0  ALBUMIN 2.7* 3.6    Lab 02/02/12 1630  LIPASE 36  AMYLASE --   Coagulation profile  Lab 02/02/12 1630  INR 0.97  PROTIME --    CBC:  Lab 02/02/12 1630 02/02/12 1516  WBC 11.6* 12.8*  NEUTROABS 9.2* --  HGB 11.9* 12.5  HCT 36.9 41.7  MCV 97.6 100.4*  PLT 185 --   Cardiac Enzymes:  Lab 02/02/12 2015 02/02/12 1630  CKTOTAL -- --  CKMB -- --  CKMBINDEX -- --  TROPONINI <0.30 <0.30   Thyroid function studies  Basename 02/02/12 2015  TSH 0.774  T4TOTAL --  T3FREE --  THYROIDAB --    Microbiology Recent Results (from the past 240 hour(s))  URINE CULTURE     Status: Normal (Preliminary result)   Collection Time   02/02/12  3:20 PM      Component Value Range Status Comment   Colony Count >=100,000 COLONIES/ML   Preliminary    Preliminary Report ESCHERICHIA COLI   Preliminary   MRSA PCR SCREENING     Status: Normal   Collection Time   02/02/12 11:03 PM      Component Value Range Status Comment   MRSA by PCR NEGATIVE  NEGATIVE Final      Procedures and Diagnostic Studies:  Ct Abdomen Pelvis Wo Contrast  02/02/2012  *RADIOLOGY REPORT*  Clinical Data: Abdominal pain, evaluate hiatal hernia, chest pain, decreased oxygen saturation, nausea for 1  CT CHEST, ABDOMEN AND PELVIS WITHOUT CONTRAST  Technique:  Multidetector CT imaging of the chest, abdomen and pelvis was performed following the standard protocol without IV contrast.  Comparison:  CT chest, abdomen pelvis - 03/16/2009; gallbladder ultrasound - earlier same day  CT CHEST  Findings:  Previously noted large hiatal hernia previously contained the stomach as well as a short segments of transverse colon.  The large hiatal hernia and now contains the entirety of a markedly distended stomach.  There is no definitive evidence of obstruction, as ingested enteric contrast passes through the stomach and into the duodenum and proximal small bowel.  The large hiatal hernia is again noted to result in mass effect upon the heart, in particular the left atrium.  Normal heart size.  Coronary artery calcifications.  Calcifications of the mitral valve annulus and within the aortic aortic valve leaflets.  No pericardial effusion.  Symmetric dependent subpleural ground-glass atelectasis.  No focal airspace opacities.  No pleural effusion or pneumothorax.  The central pulmonary airways are widely patent.  No definite mediastinal, hilar or axillary lymphadenopathy.  No acute or aggressive osseous abnormalities within the chest.  IMPRESSION:  The entirety of a markedly distended stomach is seen within the large hiatal hernia.  This does not result in enteric obstruction as ingested enteric contrast passes through the stomach and is seen within the duodenum and proximal small bowel.  CT ABDOMEN AND PELVIS  Findings:  The lack of intravenous contrast limits the ability to evaluate solid abdominal organs.  Normal hepatic contour. There is a large (approximately 1.5 x 1.7 cm stone within the neck of a distended gallbladder.  There is a minimal amount of apparent gallbladder wall thickening (measuring approximately 7 mm in diameter (image 41, series 2).  These findings are also associated with a minimal amount of apparent pericholecystic fluid on this noncontrast examination.  The common bile duct is dilated measuring approximately 9 mm in short axis diameter (image 35, series 2).  No definite intrahepatic biliary ductal dilatation on this noncontrast examination. There is a trace amount of fluid adjacent to the inferior aspect of the right lobe of the liver.  Normal noncontrast appearance of the bilateral kidneys.  Possible vascular  calcification within the right renal hilum.  No definite renal stones or evidence of urinary obstruction.  No definite perinephric stranding.  Normal noncontrast appearance of the bilateral adrenal glands, pancreas and spleen.  Large hiatal hernia as described in the preceding chest CT.  The rectum and distal sigmoid colon is mildly patulous.  The bowel is otherwise normal in course and caliber without wall thickening or evidence of obstruction.  No pneumoperitoneum, pneumatosis or portal venous gas.  Moderate amount of calcified atherosclerotic plaque within a mildly tortuous and normal caliber abdominal aorta.  Right external iliac pelvic sidewall lymphadenopathy has increased in the interval with index no conglomeration now measuring approximately 4.6 x 2.4 cm (image 65, series 2, previously, 4.3 x 2.3 cm.  Interval  development of both the right inguinal lymphadenopathy with index no conglomeration now measuring approximately 4.1 x 2.5 cm.  No definite retroperitoneal or mesenteric adenopathy on this noncontrast examination.  Post hysterectomy.  No discrete adnexal lesion.  No free fluid in the pelvis.  IMPRESSION:  1.  Cholelithiasis with findings worrisome for acute cholecystitis. Further evaluation with a HIDA scan may be performed as clinically indicated.  2. Interval progression of right external iliac and inguinal lymphadenopathy again worrisome for malignancy.  If not previously performed, percutaneous sampling may be obtained as indicated.  Above findings discussed with Dr. Rhunette Croft at 2039.   Original Report Authenticated By: Tacey Ruiz, MD    Ct Chest Wo Contrast  02/03/2012  *RADIOLOGY REPORT*  Clinical Data: Abdominal pain, evaluate hiatal hernia, chest pain, decreased oxygen saturation, nausea for 1  CT CHEST, ABDOMEN AND PELVIS WITHOUT CONTRAST  Technique:  Multidetector CT imaging of the chest, abdomen and pelvis was performed following the standard protocol without IV contrast.  Comparison:  CT chest, abdomen pelvis - 03/16/2009; gallbladder ultrasound - earlier same day  CT CHEST  Findings:  Previously noted large hiatal hernia previously contained the stomach as well as a short segments of transverse colon.  The large hiatal hernia and now contains the entirety of a markedly distended stomach.  There is no definitive evidence of obstruction, as ingested enteric contrast passes through the stomach and into the duodenum and proximal small bowel.  The large hiatal hernia is again noted to result in mass effect upon the heart, in particular the left atrium.  Normal heart size.  Coronary artery calcifications.  Calcifications of the mitral valve annulus and within the aortic aortic valve leaflets.  No pericardial effusion.  Symmetric dependent subpleural ground-glass atelectasis.  No focal airspace opacities.   No pleural effusion or pneumothorax.  The central pulmonary airways are widely patent.  No definite mediastinal, hilar or axillary lymphadenopathy.  No acute or aggressive osseous abnormalities within the chest.  IMPRESSION: The entirety of a markedly distended stomach is seen within the large hiatal hernia.  This does not result in enteric obstruction as ingested enteric contrast passes through the stomach and is seen within the duodenum and proximal small bowel.  CT ABDOMEN AND PELVIS  Findings:  The lack of intravenous contrast limits the ability to evaluate solid abdominal organs.  Normal hepatic contour. There is a large (approximately 1.5 x 1.7 cm stone within the neck of a distended gallbladder.  There is a minimal amount of apparent gallbladder wall thickening (measuring approximately 7 mm in diameter (image 41, series 2).  These findings are also associated with a minimal amount of apparent pericholecystic fluid on this noncontrast examination.  The common bile duct is dilated  measuring approximately 9 mm in short axis diameter (image 35, series 2).  No definite intrahepatic biliary ductal dilatation on this noncontrast examination. There is a trace amount of fluid adjacent to the inferior aspect of the right lobe of the liver.  Normal noncontrast appearance of the bilateral kidneys.  Possible vascular calcification within the right renal hilum.  No definite renal stones or evidence of urinary obstruction.  No definite perinephric stranding.  Normal noncontrast appearance of the bilateral adrenal glands, pancreas and spleen.  Large hiatal hernia as described in the preceding chest CT.  The rectum and distal sigmoid colon is mildly patulous.  The bowel is otherwise normal in course and caliber without wall thickening or evidence of obstruction.  No pneumoperitoneum, pneumatosis or portal venous gas.  Moderate amount of calcified atherosclerotic plaque within a mildly tortuous and normal caliber abdominal  aorta.  Right external iliac pelvic sidewall lymphadenopathy has increased in the interval with index no conglomeration now measuring approximately 4.6 x 2.4 cm (image 65, series 2, previously, 4.3 x 2.3 cm.  Interval development of both the right inguinal lymphadenopathy with index no conglomeration now measuring approximately 4.1 x 2.5 cm.  No definite retroperitoneal or mesenteric adenopathy on this noncontrast examination.  Post hysterectomy.  No discrete adnexal lesion.  No free fluid in the pelvis.  IMPRESSION:  1.  Cholelithiasis with findings worrisome for acute cholecystitis. Further evaluation with a HIDA scan may be performed as clinically indicated.  2. Interval progression of right external iliac and inguinal lymphadenopathy again worrisome for malignancy.  If not previously performed, percutaneous sampling may be obtained as indicated.  Above findings discussed with Dr. Rhunette Croft at 2039.   Original Report Authenticated By: Tacey Ruiz, MD    Nm Hepatobiliary  02/03/2012  *RADIOLOGY REPORT*  Clinical Data:  Evaluate for cholecystitis. Gallbladder distention and gallstones.  NUCLEAR MEDICINE HEPATOBILIARY IMAGING  Technique:  Sequential images of the abdomen were obtained out to 60 minutes following intravenous administration of radiopharmaceutical.  Radiopharmaceutical:  6.66mCi Tc-59m Choletec  Comparison:  Abdominal CT 02/02/2012  Findings: The radiopharmaceutical was taken up by the liver and excreted into the biliary system.  Activity is identified in the small bowel.  There is no filling of the gallbladder at 60 minutes.  Another 60 minutes of imaging was performed after the administration of 2 mg of morphine.  There is no filling of the gallbladder after the administration of morphine.  IMPRESSION: The gallbladder does not fill on this examination.  Findings are consistent with the cystic duct obstruction and cholecystitis.   Original Report Authenticated By: Richarda Overlie, M.D.    US Abdomen  Complete  02/02/2012  **ADDENDUM** CREATED: 02/02/2012 20:43:18  In hind sight, there is a minimal amount of gallbladder wall thickening seen on images 75-77.  Again, this finding is without associated pericholecystic fluid or a sonographic Murphy's sign and remains indeterminate for acute cholecystitis.  Further evaluation with a HIDA scan may be performed as clinically indicated.  **END ADDENDUM** SIGNED BY: Dyanne Carrel, MD   02/02/2012  *RADIOLOGY REPORT*  Clinical Data:  Abdominal pain  COMPLETE ABDOMINAL ULTRASOUND  Comparison:  CT abdomen pelvis - 03/16/2009  Findings:  Gallbladder:  There are at least three shadowing echogenic gallstones within an otherwise normal-appearing gallbladder, the largest of which measures approximately 2 cm in diameter.  No gallbladder wall thickening or pericholecystic fluid.  Negative sonographic Murphy's sign.  Common bile duct:  Normal in size for age, measuring approximately 5.4  mm in diameter.  Liver:  There is mild diffuse slightly coarsened heterogeneous echotexture of the hepatic parenchyma.  No discrete intrahepatic lesions.  No intrahepatic biliary duct dilatation.  No ascites.  IVC:  Appears normal.  Pancreas:  Limited visualization of the pancreatic head and neck is normal.  Visualization of the pancreatic body and tail is obscured by bowel gas.  Spleen:  Normal in size measuring 6.8 cm in length.  Right Kidney:  Normal cortical thickness, echogenicity and size, measuring 8.8 cm in length.  No focal renal lesions.  No echogenic renal stones.  No urinary obstruction.  Left Kidney:  Normal cortical thickness, echogenicity and size, measuring 10.6 cm in length.  No focal renal lesions.  No echogenic renal stones.  No urinary obstruction.  Abdominal aorta:  No aneurysm identified.  IMPRESSION: 1.  Cholelithiasis without evidence of cholecystitis.  Note, gallstones were seen on remote abdominal CT performed 03/2009.  2. Mild nonspecific coarsening of the  hepatic parenchymal echotexture without discrete hepatic lesion.  Original Report Authenticated By: Tacey Ruiz, MD    Dg Abd 2 Views  02/02/2012  *RADIOLOGY REPORT*  Clinical Data: Right upper quadrant abdominal pain, shortness of breath  ABDOMEN - 2 VIEW  Comparison: None.  Findings:  There is a large air and fluid containing retrocardiac structure which occupies the majority of the right lower hemithorax favored to represent a large hiatal hernia.  In the absence of prior examinations, the stability of this finding is indeterminate.  There is a minimal amount of distal colonic gas.  Otherwise, there is a paucity of colonic gas.  No definite pneumoperitoneum, pneumatosis or portal venous gas.  Vascular calcifications.  Moderate to severe multilevel thoracolumbar spine degenerative change.  IMPRESSION: Large hiatal hernia containing the majority of the stomach.  In the absence of prior examinations, the stability of this finding is indeterminate.  In the setting of acute right upper quadrant abdominal pain, a gastric volvulus could have a similar appearance. Clinical correlation is advised.   Original Report Authenticated By: Tacey Ruiz, MD    Scheduled Meds:    . amitriptyline  50 mg Oral QHS  . ampicillin-sulbactam (UNASYN) IV  1.5 g Intravenous Q6H  . atorvastatin  20 mg Oral q1800  . calcium-vitamin D  1 tablet Oral Daily  . heparin  5,000 Units Subcutaneous Q8H  . metoprolol succinate  100 mg Oral Daily  . pantoprazole  40 mg Oral Daily  . predniSONE  5 mg Oral Daily  . sodium chloride  3 mL Intravenous Q12H   Continuous Infusions:    . dextrose 5 % and 0.9% NaCl 75 mL/hr at 02/02/12 2329    Time spent: 35 minutes.   LOS: 2 days   RAMA,CHRISTINA  [facility] Pager [phone].  If 8PM-8AM, please contact night-coverage at [email or web address], password [email or web address] 02/04/2012, 7:13 AM

## 2012-02-04 NOTE — Progress Notes (Signed)
Patient ID: [name], female   DOB: 07-07-1925, 78 y.o.   MRN: [id number]   Pt with N and RUQ abd pain  [place] and Hepatobiliary scan show CBD obstruction and cholecystitis Pt had been consulted by surgery and wants to avoid this option if can  [facility] has asked IR to perform perc chole drain  This has been tentatively scheduled 12/28 Pt npo; labs ordered, etc... IR PA or Radiologist will see pt in am and move forward accordingly.  CAD; CHF; Ao valve sclerosis; mitral regurg; cholecystitis

## 2012-02-04 NOTE — H&P (Signed)
Interventional Radiology Pre-Procedure Evaluation and Consultation  Reason for Consult:Cholecystitis Referring Physician: Ovidio Kin, MD   HPI: [name] is an 78 y.o. female currently admitted for recurrent episodes of abdominal pain and nausea.  Imaging by [place], CT and nuclear medicine HIDA scan all suggest acute cholecystitis.  She has improved over the course of her current admission 2/2 to IV antibiotics but remains "sore" in her right abdomen and does not have much appetite.  She has been evaluated by surgery (Dr. Ezzard Standing) and cardiology (Dr. Riley Kill), and while she may be a marginal surgical candidate, she is not eager to undergo surgery at this time and is considering percutaneous drainage.   Past Medical History:  Past Medical History  Diagnosis Date  . CAD (coronary artery disease)     2 vessel intervention 2003  . Dyslipidemia   . CHF (congestive heart failure)     single episode  . Mitral regurgitation     mild prolapse anterior & posterior leaflets  . Aortic valve sclerosis     mild moderate calcification, echo, 2009  . HTN (hypertension)     difficult to obtain BP at times.  [name] Kitchen Urethral trauma     bleeding in hospital  . Rheumatoid arthritis     severe - deforming  . Lung disease, interstitial     related to rheumatoid arthritis, also question  of left apical nodule...followed elsewhere..my understanding stabilized  . Carotid artery disease     doppler 12/25/2010,  0-39% bilateral  . Ejection fraction     EF 60%, echo, 11/2007  . Clot     ??? apical clot in the past ?? no longer an issue  . Drug therapy     Prednisone.  . Depression   . GERD (gastroesophageal reflux disease)   . Cataract     Surgical History:  Past Surgical History  Procedure Date  . Appendectomy   . Breast surgery   . Coronary angioplasty with stent placement 2003    Family History:  Family History  Problem Relation Age of Onset  . Heart disease Mother   . Heart disease Father      Social History:  reports that she has never smoked. She has never used smokeless tobacco. She reports that she does not drink alcohol or use illicit drugs.  Allergies: No Known Allergies  Medications: I have reviewed the patient's current medications.  ROS: See HPI for pertinent findings, otherwise complete 10 system review negative.  Physical Exam: Blood pressure 123/59, pulse 74, temperature 98.1 F (36.7 C), temperature source Oral, resp. rate 20, height 5\' 5"  (1.651 m), weight 107 lb 5.8 oz (48.7 kg), SpO2 93.00%.  Gen: Elderly female in NAD Cardiac: RRR Pulmonary: CTA B Abd: Soft, + TTP RLQ over area of low lying GB fundus, no peritoneal signs   Labs: CBC  Basename 02/02/12 1630 02/02/12 1516  WBC 11.6* 12.8*  HGB 11.9* 12.5  HCT 36.9 41.7  PLT 185 --   MET  Basename 02/02/12 1630  NA 135  K 3.9  CL 98  CO2 25  GLUCOSE 101*  BUN 13  CREATININE 0.50  CALCIUM 9.3    Basename 02/03/12 0346 02/02/12 1630  PROT 5.4* --  ALBUMIN 2.7* --  AST 26 --  ALT 9 --  ALKPHOS 69 --  BILITOT 0.4 --  BILIDIR 0.2 --  IBILI 0.2* --  LIPASE -- 36   PT/INR  Basename 02/02/12 1630  LABPROT 12.8  INR 0.97  ABG No results found for this basename: PHART:2,PCO2:2,PO2:2,HCO3:2 in the last 72 hours    Ct Abdomen Pelvis Wo Contrast  02/02/2012  *RADIOLOGY REPORT*  Clinical Data: Abdominal pain, evaluate hiatal hernia, chest pain, decreased oxygen saturation, nausea for 1  CT CHEST, ABDOMEN AND PELVIS WITHOUT CONTRAST  Technique:  Multidetector CT imaging of the chest, abdomen and pelvis was performed following the standard protocol without IV contrast.  Comparison:  CT chest, abdomen pelvis - 03/16/2009; gallbladder ultrasound - earlier same day  CT CHEST  Findings:  Previously noted large hiatal hernia previously contained the stomach as well as a short segments of transverse colon.  The large hiatal hernia and now contains the entirety of a markedly distended stomach.   There is no definitive evidence of obstruction, as ingested enteric contrast passes through the stomach and into the duodenum and proximal small bowel.  The large hiatal hernia is again noted to result in mass effect upon the heart, in particular the left atrium.  Normal heart size.  Coronary artery calcifications.  Calcifications of the mitral valve annulus and within the aortic aortic valve leaflets.  No pericardial effusion.  Symmetric dependent subpleural ground-glass atelectasis.  No focal airspace opacities.  No pleural effusion or pneumothorax.  The central pulmonary airways are widely patent.  No definite mediastinal, hilar or axillary lymphadenopathy.  No acute or aggressive osseous abnormalities within the chest.  IMPRESSION: The entirety of a markedly distended stomach is seen within the large hiatal hernia.  This does not result in enteric obstruction as ingested enteric contrast passes through the stomach and is seen within the duodenum and proximal small bowel.  CT ABDOMEN AND PELVIS  Findings:  The lack of intravenous contrast limits the ability to evaluate solid abdominal organs.  Normal hepatic contour. There is a large (approximately 1.5 x 1.7 cm stone within the neck of a distended gallbladder.  There is a minimal amount of apparent gallbladder wall thickening (measuring approximately 7 mm in diameter (image 41, series 2).  These findings are also associated with a minimal amount of apparent pericholecystic fluid on this noncontrast examination.  The common bile duct is dilated measuring approximately 9 mm in short axis diameter (image 35, series 2).  No definite intrahepatic biliary ductal dilatation on this noncontrast examination. There is a trace amount of fluid adjacent to the inferior aspect of the right lobe of the liver.  Normal noncontrast appearance of the bilateral kidneys.  Possible vascular calcification within the right renal hilum.  No definite renal stones or evidence of urinary  obstruction.  No definite perinephric stranding.  Normal noncontrast appearance of the bilateral adrenal glands, pancreas and spleen.  Large hiatal hernia as described in the preceding chest CT.  The rectum and distal sigmoid colon is mildly patulous.  The bowel is otherwise normal in course and caliber without wall thickening or evidence of obstruction.  No pneumoperitoneum, pneumatosis or portal venous gas.  Moderate amount of calcified atherosclerotic plaque within a mildly tortuous and normal caliber abdominal aorta.  Right external iliac pelvic sidewall lymphadenopathy has increased in the interval with index no conglomeration now measuring approximately 4.6 x 2.4 cm (image 65, series 2, previously, 4.3 x 2.3 cm.  Interval development of both the right inguinal lymphadenopathy with index no conglomeration now measuring approximately 4.1 x 2.5 cm.  No definite retroperitoneal or mesenteric adenopathy on this noncontrast examination.  Post hysterectomy.  No discrete adnexal lesion.  No free fluid in the pelvis.  IMPRESSION:  1.  Cholelithiasis with findings worrisome for acute cholecystitis. Further evaluation with a HIDA scan may be performed as clinically indicated.  2. Interval progression of right external iliac and inguinal lymphadenopathy again worrisome for malignancy.  If not previously performed, percutaneous sampling may be obtained as indicated.  Above findings discussed with Dr. Rhunette Croft at 2039.   Original Report Authenticated By: Tacey Ruiz, MD    Ct Chest Wo Contrast  02/03/2012  *RADIOLOGY REPORT*  Clinical Data: Abdominal pain, evaluate hiatal hernia, chest pain, decreased oxygen saturation, nausea for 1  CT CHEST, ABDOMEN AND PELVIS WITHOUT CONTRAST  Technique:  Multidetector CT imaging of the chest, abdomen and pelvis was performed following the standard protocol without IV contrast.  Comparison:  CT chest, abdomen pelvis - 03/16/2009; gallbladder ultrasound - earlier same day  CT CHEST   Findings:  Previously noted large hiatal hernia previously contained the stomach as well as a short segments of transverse colon.  The large hiatal hernia and now contains the entirety of a markedly distended stomach.  There is no definitive evidence of obstruction, as ingested enteric contrast passes through the stomach and into the duodenum and proximal small bowel.  The large hiatal hernia is again noted to result in mass effect upon the heart, in particular the left atrium.  Normal heart size.  Coronary artery calcifications.  Calcifications of the mitral valve annulus and within the aortic aortic valve leaflets.  No pericardial effusion.  Symmetric dependent subpleural ground-glass atelectasis.  No focal airspace opacities.  No pleural effusion or pneumothorax.  The central pulmonary airways are widely patent.  No definite mediastinal, hilar or axillary lymphadenopathy.  No acute or aggressive osseous abnormalities within the chest.  IMPRESSION: The entirety of a markedly distended stomach is seen within the large hiatal hernia.  This does not result in enteric obstruction as ingested enteric contrast passes through the stomach and is seen within the duodenum and proximal small bowel.  CT ABDOMEN AND PELVIS  Findings:  The lack of intravenous contrast limits the ability to evaluate solid abdominal organs.  Normal hepatic contour. There is a large (approximately 1.5 x 1.7 cm stone within the neck of a distended gallbladder.  There is a minimal amount of apparent gallbladder wall thickening (measuring approximately 7 mm in diameter (image 41, series 2).  These findings are also associated with a minimal amount of apparent pericholecystic fluid on this noncontrast examination.  The common bile duct is dilated measuring approximately 9 mm in short axis diameter (image 35, series 2).  No definite intrahepatic biliary ductal dilatation on this noncontrast examination. There is a trace amount of fluid adjacent to the  inferior aspect of the right lobe of the liver.  Normal noncontrast appearance of the bilateral kidneys.  Possible vascular calcification within the right renal hilum.  No definite renal stones or evidence of urinary obstruction.  No definite perinephric stranding.  Normal noncontrast appearance of the bilateral adrenal glands, pancreas and spleen.  Large hiatal hernia as described in the preceding chest CT.  The rectum and distal sigmoid colon is mildly patulous.  The bowel is otherwise normal in course and caliber without wall thickening or evidence of obstruction.  No pneumoperitoneum, pneumatosis or portal venous gas.  Moderate amount of calcified atherosclerotic plaque within a mildly tortuous and normal caliber abdominal aorta.  Right external iliac pelvic sidewall lymphadenopathy has increased in the interval with index no conglomeration now measuring approximately 4.6 x 2.4 cm (image 65, series 2, previously,  4.3 x 2.3 cm.  Interval development of both the right inguinal lymphadenopathy with index no conglomeration now measuring approximately 4.1 x 2.5 cm.  No definite retroperitoneal or mesenteric adenopathy on this noncontrast examination.  Post hysterectomy.  No discrete adnexal lesion.  No free fluid in the pelvis.  IMPRESSION:  1.  Cholelithiasis with findings worrisome for acute cholecystitis. Further evaluation with a HIDA scan may be performed as clinically indicated.  2. Interval progression of right external iliac and inguinal lymphadenopathy again worrisome for malignancy.  If not previously performed, percutaneous sampling may be obtained as indicated.  Above findings discussed with Dr. Rhunette Croft at 2039.   Original Report Authenticated By: Tacey Ruiz, MD    Nm Hepatobiliary  02/03/2012  *RADIOLOGY REPORT*  Clinical Data:  Evaluate for cholecystitis. Gallbladder distention and gallstones.  NUCLEAR MEDICINE HEPATOBILIARY IMAGING  Technique:  Sequential images of the abdomen were obtained out  to 60 minutes following intravenous administration of radiopharmaceutical.  Radiopharmaceutical:  6.77mCi Tc-61m Choletec  Comparison:  Abdominal CT 02/02/2012  Findings: The radiopharmaceutical was taken up by the liver and excreted into the biliary system.  Activity is identified in the small bowel.  There is no filling of the gallbladder at 60 minutes.  Another 60 minutes of imaging was performed after the administration of 2 mg of morphine.  There is no filling of the gallbladder after the administration of morphine.  IMPRESSION: The gallbladder does not fill on this examination.  Findings are consistent with the cystic duct obstruction and cholecystitis.   Original Report Authenticated By: Richarda Overlie, M.D.     Assessment/Plan: 78 yo female with acute on chronic cholecystitis.  She is a marginal surgical candidate and prefers to avoid surgery if possible.  After a lengthy discussion with her and her son, they have agreed to proceed with cholecystostomy tube placement in the morning.    They understand that she may have to live with and maintain this tube for the rest of her life, or until such a time that she desires and is fit for interval cholecystectomy.  This will require some small daily care and visits to IR for tube check/change every 6-8 weeks. They understand that while we could theoretically remove the tube after she recovers from this acute episode, with her persistent stones there is about a 50% chance that she will have recurrent cholecystitis within 3 months.   - NPO after midnight - Hold heparin beginning at 0300 for planned 0700 case. - Perc chole placement in am  Signed,  Sterling Big, MD Vascular & Interventional Radiologist [facility]

## 2012-02-04 NOTE — Progress Notes (Addendum)
INITIAL NUTRITION ASSESSMENT  DOCUMENTATION CODES Per approved criteria  -Not Applicable   INTERVENTION: 1.  Modify diet; per MD discretion once medically appropriate.  RD to follow for care plan and resume of PO diet.  NUTRITION DIAGNOSIS: Inadequate oral intake related to inability to eat as evidenced by NPO.   Monitor:  1.  Food/Beverage; diet advancement as tolerated.  Reason for Assessment: consult; NPO  78 y.o. female  Admitting Dx: Abdominal pain  ASSESSMENT: Pt admitted with cholecystitis.  Currently contemplating management options.   Pt was able to tolerate full liquids yesterday, but has been placed NPO for possible surgery.   Pt with limited mobility due to rheumatoid arthritis.  Son performs cooking and grocery shopping for pt.  Dietary recall obtained from pt and son as: Breakfast: oatmeal, 'couple' cups Reg, black coffee Lunch: sandwich, sweet tea or coke Snack: peanut butter crackers, occasional Ensure Dinner: Meat and vegetables or salad  Pt states her usual wt was 120 lbs 'a few years ago.'  Sons states it recently stabilized around 114 lbs, but has gone done to 105 lbs in the past 2 years.  Height: Ht Readings from Last 1 Encounters:  02/02/12 5\' 5"  (1.651 m)    Weight: Wt Readings from Last 1 Encounters:  02/04/12 106 lb 14.8 oz (48.5 kg)    Ideal Body Weight: 125 lbs  % Ideal Body Weight: 84%  Wt Readings from Last 10 Encounters:  02/04/12 106 lb 14.8 oz (48.5 kg)  02/02/12 103 lb 3.2 oz (46.811 kg)  12/29/10 110 lb (49.896 kg)  11/20/09 114 lb (51.71 kg)  10/28/08 112 lb (50.803 kg)    Usual Body Weight: 110 lbs  % Usual Body Weight: 96%  BMI:  Body mass index is 17.79 kg/(m^2).  Estimated Nutritional Needs: Kcal: 1210-1350 Protein: 50-60g Fluid: >1.5 L/day  Skin: intact  Diet Order: NPO  EDUCATION NEEDS: -No education needs identified at this time   Intake/Output Summary (Last 24 hours) at 02/04/12 1004 Last data filed  at 02/04/12 0900  Gross per 24 hour  Intake   2185 ml  Output   1150 ml  Net   1035 ml    Last BM: 12/25  Labs:   Lab 02/02/12 1630  NA 135  K 3.9  CL 98  CO2 25  BUN 13  CREATININE 0.50  CALCIUM 9.3  MG 2.0  PHOS --  GLUCOSE 101*    CBG (last 3)  No results found for this basename: GLUCAP:3 in the last 72 hours  Scheduled Meds:   . amitriptyline  50 mg Oral QHS  . ampicillin-sulbactam (UNASYN) IV  1.5 g Intravenous Q6H  . atorvastatin  20 mg Oral q1800  . calcium-vitamin D  1 tablet Oral Daily  . heparin  5,000 Units Subcutaneous Q8H  . metoprolol succinate  100 mg Oral Daily  . pantoprazole  40 mg Oral Daily  . predniSONE  5 mg Oral Daily  . sodium chloride  3 mL Intravenous Q12H    Continuous Infusions:   . dextrose 5 % and 0.9% NaCl 75 mL/hr at 02/02/12 2329    Past Medical History  Diagnosis Date  . CAD (coronary artery disease)     2 vessel intervention 2003  . Dyslipidemia   . CHF (congestive heart failure)     single episode  . Mitral regurgitation     mild prolapse anterior & posterior leaflets  . Aortic valve sclerosis     mild moderate calcification, echo,  2009  . HTN (hypertension)     difficult to obtain BP at times.  [name] Kitchen Urethral trauma     bleeding in hospital  . Rheumatoid arthritis     severe - deforming  . Lung disease, interstitial     related to rheumatoid arthritis, also question  of left apical nodule...followed elsewhere..my understanding stabilized  . Carotid artery disease     doppler 12/25/2010,  0-39% bilateral  . Ejection fraction     EF 60%, echo, 11/2007  . Clot     ??? apical clot in the past ?? no longer an issue  . Drug therapy     Prednisone.  . Depression   . GERD (gastroesophageal reflux disease)   . Cataract     Past Surgical History  Procedure Date  . Appendectomy   . Breast surgery   . Coronary angioplasty with stent placement 2003    Loyce Dys, MS RD LDN Clinical Inpatient  Dietitian Pager: [phone] Weekend/After hours pager: [phone]

## 2012-02-04 NOTE — Consult Note (Signed)
CARDIOLOGY CONSULT NOTE   Patient ID: [name] MRN: [id number] DOB/AGE: 16-Aug-1925 78 y.o.  Admit date: 02/02/2012  Primary Physician   Tula Nakayama, MD Primary Cardiologist   JK Reason for Consultation   Pre-op eval  [id number] [name] is a 78 y.o. female with a history of CAD. She developed abdominal pain, N&V 12/25. She was diagnosed with Acute cholecystitis and the options include ABX, percutaneous drain and surgery. As part of the decision process, cardiology was asked to evaluate her.  [name] is very inactive. She cannot walk around the grocery store without stopping to rest. She denies exertional chest pain or SOB but admits she does not exert herself very much or do very many steps. She does not know if she can walk up an entire flight of steps without stopping. She does not do work around the house or yard. She gets regular chest pain but feels it is from hiatal hernia or GERD, as it goes away with drinking water. She denies LE edema, orthopnea or PND. She denies weight gain. She feels better now than on admission and tolerated clear liquids last pm.   Past Medical History  Diagnosis Date  . CAD (coronary artery disease)     2 vessel intervention 2003  . Dyslipidemia   . CHF (congestive heart failure)     single episode  . Mitral regurgitation     mild prolapse anterior & posterior leaflets  . Aortic valve sclerosis     mild moderate calcification, echo, 2009  . HTN (hypertension)     difficult to obtain BP at times.  [name] Kitchen Urethral trauma     bleeding in hospital  . Rheumatoid arthritis     severe - deforming  . Lung disease, interstitial     related to rheumatoid arthritis, also question  of left apical nodule...followed elsewhere..my understanding stabilized  . Carotid artery disease     doppler 12/25/2010,  0-39% bilateral  . Ejection fraction     EF 60%, echo, 11/2007  . Clot     ??? apical clot in the past ?? no longer an issue  . Drug  therapy     Prednisone.  . Depression   . GERD (gastroesophageal reflux disease)   . Cataract      Past Surgical History  Procedure Date  . Appendectomy   . Breast surgery   . Coronary angioplasty with stent placement 2003    No Known Allergies  I have reviewed the patient's current medications    . amitriptyline  50 mg Oral QHS  . ampicillin-sulbactam (UNASYN) IV  1.5 g Intravenous Q6H  . atorvastatin  20 mg Oral q1800  . calcium-vitamin D  1 tablet Oral Daily  . heparin  5,000 Units Subcutaneous Q8H  . metoprolol succinate  100 mg Oral Daily  . pantoprazole  40 mg Oral Daily  . predniSONE  5 mg Oral Daily  . sodium chloride  3 mL Intravenous Q12H      . dextrose 5 % and 0.9% NaCl 75 mL/hr at 02/02/12 2329   acetaminophen, HYDROmorphone (DILAUDID) injection, ondansetron (ZOFRAN) IV, ondansetron  Prior to Admission medications   Medication Sig Start Date End Date Taking? Authorizing Provider  acetaminophen (TYLENOL) 500 MG tablet Take 1,000 mg by mouth every 6 (six) hours as needed. pain   Yes Historical Provider, MD  amitriptyline (ELAVIL) 50 MG tablet Take 1 tablet (50 mg total) by mouth at bedtime. 05/27/11 05/26/12 Yes Luis Abed,  MD  aspirin-acetaminophen-caffeine (EXCEDRIN EXTRA STRENGTH) [phone] MG per tablet Take 2 tablets by mouth every 6 (six) hours as needed.   Yes Historical Provider, MD  atorvastatin (LIPITOR) 20 MG tablet Take 1 tablet (20 mg total) by mouth daily. 08/18/11 08/17/12 Yes Luis Abed, MD  Calcium Carbonate-Vitamin D (CALCIUM 600+D) 600-400 MG-UNIT per tablet Take 1 tablet by mouth daily.     Yes Historical Provider, MD  esomeprazole (NEXIUM) 40 MG capsule Take 1 capsule (40 mg total) by mouth daily before breakfast. 04/26/11 04/25/12 Yes Luis Abed, MD  leflunomide (ARAVA) 20 MG tablet Take 20 mg by mouth daily.     Yes Historical Provider, MD  metoprolol succinate (TOPROL XL) 100 MG 24 hr tablet Take 1 tablet (100 mg total) by mouth  daily. 05/31/11 05/30/12 Yes Luis Abed, MD  Multiple Vitamins-Minerals (EYE VITAMINS PO) Take 1 tablet by mouth daily.    Yes Historical Provider, MD  predniSONE (DELTASONE) 5 MG tablet Take 5 mg by mouth daily.     Yes Historical Provider, MD     History   Social History  . Marital Status: Widowed    Spouse Name: N/A    Number of Children: N/A  . Years of Education: N/A   Occupational History  . Retired   Social History Main Topics  . Smoking status: Never Smoker   . Smokeless tobacco: Never Used  . Alcohol Use: No  . Drug Use: No  . Sexually Active: Not on file   Other Topics Concern  . Not on file   Social History Narrative   Lives with son in [place], [place]. She is retired.     Family History - Both parents deceased  Problem Relation Age of Onset  . Heart disease Mother   . Heart disease Father      ROS: No fevers or chills. Chronic MS aches/pains. Full 14 point review of systems complete and found to be negative unless listed above.  Physical Exam: Blood pressure 119/48, pulse 96, temperature 98 F (36.7 C), temperature source Oral, resp. rate 27, height 5\' 5"  (1.651 m), weight 106 lb 14.8 oz (48.5 kg), SpO2 96.00%.  General: Well developed, slender elderly, female in no acute distress Head: Eyes PERRLA, No xanthomas.   Normocephalic and atraumatic, oropharynx without edema or exudate. Dentition: poor Lungs: bilateral basilar rales Heart: HRRR S1 S2, no rub/gallop, 2-3/6 murmur. pulses are 2+ all 4 extrem.   Neck: No carotid bruits, + radiation murmur. No lymphadenopathy.  JVD 8-10 cm Abdomen: Bowel sounds present, abdomen soft and tender without masses or hernias noted. Msk:  No spine or cva tenderness. Generalized weakness, multiple joint deformities, no effusions. Extremities: No clubbing or cyanosis. No edema. Marked changes of RA upper and lower extremities Neuro: Alert and oriented X 3. No focal deficits noted. Psych:  Good affect, responds  appropriately Skin: No rashes or lesions noted.  Labs:   Lab Results  Component Value Date   WBC 11.6* 02/02/2012   HGB 11.9* 02/02/2012   HCT 36.9 02/02/2012   MCV 97.6 02/02/2012   PLT 185 02/02/2012    Basename 02/02/12 1630  INR 0.97     Lab 02/03/12 0346 02/02/12 1630  NA -- 135  K -- 3.9  CL -- 98  CO2 -- 25  BUN -- 13  CREATININE -- 0.50  CALCIUM -- 9.3  PROT 5.4* --  BILITOT 0.4 --  ALKPHOS 69 --  ALT 9 --  AST 26 --  GLUCOSE -- 101*   Magnesium  Date Value Range Status  02/02/2012 2.0  1.5 - 2.5 mg/dL Final    Basename 16/10/96 2015 02/02/12 1630  CKTOTAL -- --  CKMB -- --  TROPONINI <0.30 <0.30   Lipase  Date/Time Value Range Status  02/02/2012  4:30 PM 36  11 - 59 U/L Final   TSH  Date/Time Value Range Status  02/02/2012  8:15 PM 0.774  0.350 - 4.500 uIU/mL Final   Cardiac Cath: 04/18/2001 Left main coronary artery had a 30% discrete stenosis.  Left anterior descending artery had an 80% mid vessel lesion at the takeoff of  a small first diagonal branch.  The circumflex coronary artery had an 80% OM lesion.  The right coronary artery had 40-50% mid vessel lesion. PCI 04/21/2001 FINAL RESULTS:  1. Successful primary stent placement in the first marginal branch and  left circumflex artery with reduction of 80% narrowing to 0% with  placement of 2.5 x 8 mm Express II stent.  2. Successful percutaneous transluminal coronary angioplasty and stent  placed in the proximal and mid left anterior descending with a sequential  90 and 70% narrowings to 0% with placement of 2.75 x 15 mm Zeta stent.   Echo: 11/22/2007 SUMMARY - Overall left ventricular systolic function was normal. Left ventricular ejection fraction was estimated to be 60 %. There were no left ventricular regional wall motion abnormalities. Doppler parameters were consistent with abnormal left ventricular relaxation. Doppler parameters were consistent with high left ventricular  filling pressure. - The aortic valve was moderately calcified. There was trivial aortic valvular regurgitation. The mean transaortic valve gradient was 6 mmHg. Estimated aortic valve area (by VTI) was 1.6 cm^2. Estimated aortic valve area (by Vmax) was 1.53 cm^2. - There was a mild mitral valve prolapse involving the anterior and posterior leaflets. There was mild mitral annular calcification. There was mild mitral valvular regurgitation. The effective orifice of mitral regurgitation by proximal isovelocity surface area was 0.11 cm^2. The volume of mitral regurgitation by proximal isovelocity surface area was 18 cc. - The estimated peak pulmonary artery systolic pressure was mildly increased.   ECG: 02-Feb-2012 16:51:00 [facility] ED ROUTINE RECORD SINUS RHYTHM ~ normal P axis, V-rate 50- 99 BORDERLINE RIGHT AXIS DEVIATION ~ QRS axis ( 81, 90) NONSPECIFIC REPOL ABNORMALITY, DIFFUSE LEADS ~ ST dep, T flat/neg, ant/lat/inf ARTIFACT IN LEAD(S) I,II,III,aVR,aVL,aVF,V1 Standard 12 Lead Report ~ Not Confirmed Abnormal ECG 58mm/s 44mm/mV 150Hz  8.0.1 12SL 235 CID: [id number] Referred by: Unconfirmed Vent. rate 89 BPM PR interval 144 ms QRS duration 98 ms QT/QTc 368/448 ms P-R-T axes 68 83 -47   Radiology:  Ct Abdomen Pelvis Wo Contrast 02/02/2012  *RADIOLOGY REPORT*  Clinical Data: Abdominal pain, evaluate hiatal hernia, chest pain, decreased oxygen saturation, nausea for 1  CT CHEST, ABDOMEN AND PELVIS WITHOUT CONTRAST  Technique:  Multidetector CT imaging of the chest, abdomen and pelvis was performed following the standard protocol without IV contrast.  Comparison:  CT chest, abdomen pelvis - 03/16/2009; gallbladder ultrasound - earlier same day   CT CHEST  Findings:  Previously noted large hiatal hernia previously contained the stomach as well as a short segments of transverse colon.  The large hiatal hernia and now contains the entirety of a markedly distended stomach.  There  is no definitive evidence of obstruction, as ingested enteric contrast passes through the stomach and into the duodenum and proximal small bowel.  The large hiatal hernia is again noted to result  in mass effect upon the heart, in particular the left atrium.  Normal heart size.  Coronary artery calcifications.  Calcifications of the mitral valve annulus and within the aortic aortic valve leaflets.  No pericardial effusion.  Symmetric dependent subpleural ground-glass atelectasis.  No focal airspace opacities.  No pleural effusion or pneumothorax.  The central pulmonary airways are widely patent.  No definite mediastinal, hilar or axillary lymphadenopathy.  No acute or aggressive osseous abnormalities within the chest.  IMPRESSION: The entirety of a markedly distended stomach is seen within the large hiatal hernia.  This does not result in enteric obstruction as ingested enteric contrast passes through the stomach and is seen within the duodenum and proximal small bowel.    CT ABDOMEN AND PELVIS  Findings:  The lack of intravenous contrast limits the ability to evaluate solid abdominal organs.  Normal hepatic contour. There is a large (approximately 1.5 x 1.7 cm stone within the neck of a distended gallbladder.  There is a minimal amount of apparent gallbladder wall thickening (measuring approximately 7 mm in diameter (image 41, series 2).  These findings are also associated with a minimal amount of apparent pericholecystic fluid on this noncontrast examination.  The common bile duct is dilated measuring approximately 9 mm in short axis diameter (image 35, series 2).  No definite intrahepatic biliary ductal dilatation on this noncontrast examination. There is a trace amount of fluid adjacent to the inferior aspect of the right lobe of the liver.  Normal noncontrast appearance of the bilateral kidneys.  Possible vascular calcification within the right renal hilum.  No definite renal stones or evidence of urinary  obstruction.  No definite perinephric stranding.  Normal noncontrast appearance of the bilateral adrenal glands, pancreas and spleen.  Large hiatal hernia as described in the preceding chest CT.  The rectum and distal sigmoid colon is mildly patulous.  The bowel is otherwise normal in course and caliber without wall thickening or evidence of obstruction.  No pneumoperitoneum, pneumatosis or portal venous gas.  Moderate amount of calcified atherosclerotic plaque within a mildly tortuous and normal caliber abdominal aorta.  Right external iliac pelvic sidewall lymphadenopathy has increased in the interval with index no conglomeration now measuring approximately 4.6 x 2.4 cm (image 65, series 2, previously, 4.3 x 2.3 cm.  Interval development of both the right inguinal lymphadenopathy with index no conglomeration now measuring approximately 4.1 x 2.5 cm.  No definite retroperitoneal or mesenteric adenopathy on this noncontrast examination.  Post hysterectomy.  No discrete adnexal lesion.  No free fluid in the pelvis.  IMPRESSION:  1.  Cholelithiasis with findings worrisome for acute cholecystitis. Further evaluation with a HIDA scan may be performed as clinically indicated.  2. Interval progression of right external iliac and inguinal lymphadenopathy again worrisome for malignancy.  If not previously performed, percutaneous sampling may be obtained as indicated.  Above findings discussed with Dr. Rhunette Croft at 2039.   Original Report Authenticated By: Tacey Ruiz, MD    Nm Hepatobiliary 02/03/2012  *RADIOLOGY REPORT*  Clinical Data:  Evaluate for cholecystitis. Gallbladder distention and gallstones.  NUCLEAR MEDICINE HEPATOBILIARY IMAGING  Technique:  Sequential images of the abdomen were obtained out to 60 minutes following intravenous administration of radiopharmaceutical.  Radiopharmaceutical:  6.75mCi Tc-88m Choletec  Comparison:  Abdominal CT 02/02/2012  Findings: The radiopharmaceutical was taken up by the liver  and excreted into the biliary system.  Activity is identified in the small bowel.  There is no filling of the gallbladder at 60  minutes.  Another 60 minutes of imaging was performed after the administration of 2 mg of morphine.  There is no filling of the gallbladder after the administration of morphine.  IMPRESSION: The gallbladder does not fill on this examination.  Findings are consistent with the cystic duct obstruction and cholecystitis.   Original Report Authenticated By: Richarda Overlie, M.D.    US Abdomen Complete 02/02/2012  **ADDENDUM** CREATED: 02/02/2012 20:43:18  In hind sight, there is a minimal amount of gallbladder wall thickening seen on images 75-77.  Again, this finding is without associated pericholecystic fluid or a sonographic Murphy's sign and remains indeterminate for acute cholecystitis.  Further evaluation with a HIDA scan may be performed as clinically indicated.  **END ADDENDUM** SIGNED BY: Dyanne Carrel, MD   02/02/2012  *RADIOLOGY REPORT*  Clinical Data:  Abdominal pain  COMPLETE ABDOMINAL ULTRASOUND  Comparison:  CT abdomen pelvis - 03/16/2009  Findings:  Gallbladder:  There are at least three shadowing echogenic gallstones within an otherwise normal-appearing gallbladder, the largest of which measures approximately 2 cm in diameter.  No gallbladder wall thickening or pericholecystic fluid.  Negative sonographic Murphy's sign.  Common bile duct:  Normal in size for age, measuring approximately 5.4 mm in diameter.  Liver:  There is mild diffuse slightly coarsened heterogeneous echotexture of the hepatic parenchyma.  No discrete intrahepatic lesions.  No intrahepatic biliary duct dilatation.  No ascites.  IVC:  Appears normal.  Pancreas:  Limited visualization of the pancreatic head and neck is normal.  Visualization of the pancreatic body and tail is obscured by bowel gas.  Spleen:  Normal in size measuring 6.8 cm in length.  Right Kidney:  Normal cortical thickness,  echogenicity and size, measuring 8.8 cm in length.  No focal renal lesions.  No echogenic renal stones.  No urinary obstruction.  Left Kidney:  Normal cortical thickness, echogenicity and size, measuring 10.6 cm in length.  No focal renal lesions.  No echogenic renal stones.  No urinary obstruction.  Abdominal aorta:  No aneurysm identified.  IMPRESSION: 1.  Cholelithiasis without evidence of cholecystitis.  Note, gallstones were seen on remote abdominal CT performed 03/2009.  2. Mild nonspecific coarsening of the hepatic parenchymal echotexture without discrete hepatic lesion.  Original Report Authenticated By: Tacey Ruiz, MD    Dg Abd 2 Views 02/02/2012  *RADIOLOGY REPORT*  Clinical Data: Right upper quadrant abdominal pain, shortness of breath  ABDOMEN - 2 VIEW  Comparison: None.  Findings:  There is a large air and fluid containing retrocardiac structure which occupies the majority of the right lower hemithorax favored to represent a large hiatal hernia.  In the absence of prior examinations, the stability of this finding is indeterminate.  There is a minimal amount of distal colonic gas.  Otherwise, there is a paucity of colonic gas.  No definite pneumoperitoneum, pneumatosis or portal venous gas.  Vascular calcifications.  Moderate to severe multilevel thoracolumbar spine degenerative change.  IMPRESSION: Large hiatal hernia containing the majority of the stomach.  In the absence of prior examinations, the stability of this finding is indeterminate.  In the setting of acute right upper quadrant abdominal pain, a gastric volvulus could have a similar appearance. Clinical correlation is advised.   Original Report Authenticated By: Tacey Ruiz, MD     ASSESSMENT AND PLAN:   The patient was seen today by Dr Riley Kill, the patient evaluated and the data reviewed.  Pre-op eval - Ms Badolato has a history of CAD, no  cath/stress test since 2003, EF last assessed in 2009 and was preserved. She has no ischemic  symptoms but also has a very low level of activity. Her cardiac status is stable but her general frailty is concerning. Will order an echocardiogram and continue to follow. MD advise if further testing needed.  Otherwise, per primary MD, Surgery. Principal Problem:  *Abdominal pain Active Problems:  Mitral regurgitation  Lung disease, interstitial  Aortic valve sclerosis  CHF (congestive heart failure)  CAD (coronary artery disease)  Rheumatoid arthritis on chronic steroids  Hiatal hernia  Lymphadenopathy, abdominal  Cholelithiasis   Signed: Theodore Demark 02/04/2012, 8:56 AM Co-Sign MD  Patient seen and examined.  She has very limited activity, but does not seem to have active cardiac symptoms.  She is not likely to be able to do 4 mets of overall activity on a daily basis, but her son and her both say she can tool about without much in the way of symptoms.  She previously underwent cath and stenting of two vessels with BMS and has not had recurrent symptoms.  In addition to CAD, she has mild bileaflet MVP, and aortic sclerosis.  Her exam reveals nearly clear lungs, and SEM.  No LE edema.  Slight tenderness RUQ.   ECG  No acute changes Rare PVC.    Recommendations:  1.  Patient is not inclined toward surgery.  Risk assessment is difficult.  She has had prior BMS stents, and that is remote.  She is not generally symptomatic, but cannot regularly do 4 mets of activity.  General overall risk, even independent of cardiac risk, would likely be increased do to her age and general frail condition.  The patient prefers tube drainage, and this seems reasonable to do unless it is far inferior to surgery.  If she would need surgery, she could be assessed electively.    Our team will follow with you.  If her beta blocker cannot be restarted PO, then small doses of IV would be appropriate as she is modestly tachycardiac.

## 2012-02-04 NOTE — Progress Notes (Signed)
Patient ID: [name], female   DOB: 02-07-1926, 78 y.o.   MRN: [id number]    Subjective: Pt feels ok.  Minimal pain.  No nausea. Tolerating liquids.     Objective: Vital signs in last 24 hours: Temp:  [98.3 F (36.8 C)-102.8 F (39.3 C)] 98.3 F (36.8 C) (12/27 0653) Pulse Rate:  [93-114] 110  (12/27 0653) Resp:  [19-30] 25  (12/27 0653) BP: (99-137)/(49-65) 106/49 mmHg (12/27 0600) SpO2:  [93 %-99 %] 95 % (12/27 0653) Weight:  [106 lb 14.8 oz (48.5 kg)] 106 lb 14.8 oz (48.5 kg) (12/27 0400) Last BM Date: 02/02/12  Intake/Output from previous day: 12/26 0701 - 12/27 0700 In: 2185 [P.O.:260; I.V.:1725; IV Piggyback:200] Out: 1150 [Urine:1150] Intake/Output this shift:    PE: Abd: soft, mildly tender in RUQ, +BS, ND Heart: tachy , but reg  Lab Results:   Basename 02/02/12 1630 02/02/12 1516  WBC 11.6* 12.8*  HGB 11.9* 12.5  HCT 36.9 41.7  PLT 185 --   BMET  Basename 02/02/12 1630  NA 135  K 3.9  CL 98  CO2 25  GLUCOSE 101*  BUN 13  CREATININE 0.50  CALCIUM 9.3   PT/INR  Basename 02/02/12 1630  LABPROT 12.8  INR 0.97   CMP     Component Value Date/Time   NA 135 02/02/2012 1630   K 3.9 02/02/2012 1630   CL 98 02/02/2012 1630   CO2 25 02/02/2012 1630   GLUCOSE 101* 02/02/2012 1630   BUN 13 02/02/2012 1630   CREATININE 0.50 02/02/2012 1630   CALCIUM 9.3 02/02/2012 1630   PROT 5.4* 02/03/2012 0346   ALBUMIN 2.7* 02/03/2012 0346   AST 26 02/03/2012 0346   ALT 9 02/03/2012 0346   ALKPHOS 69 02/03/2012 0346   BILITOT 0.4 02/03/2012 0346   GFRNONAA 86* 02/02/2012 1630   GFRAA >90 02/02/2012 1630   Lipase     Component Value Date/Time   LIPASE 36 02/02/2012 1630       Studies/Results: Ct Abdomen Pelvis Wo Contrast  02/02/2012  *RADIOLOGY REPORT*  Clinical Data: Abdominal pain, evaluate hiatal hernia, chest pain, decreased oxygen saturation, nausea for 1  CT CHEST, ABDOMEN AND PELVIS WITHOUT CONTRAST  Technique:  Multidetector CT imaging  of the chest, abdomen and pelvis was performed following the standard protocol without IV contrast.  Comparison:  CT chest, abdomen pelvis - 03/16/2009; gallbladder ultrasound - earlier same day  CT CHEST  Findings:  Previously noted large hiatal hernia previously contained the stomach as well as a short segments of transverse colon.  The large hiatal hernia and now contains the entirety of a markedly distended stomach.  There is no definitive evidence of obstruction, as ingested enteric contrast passes through the stomach and into the duodenum and proximal small bowel.  The large hiatal hernia is again noted to result in mass effect upon the heart, in particular the left atrium.  Normal heart size.  Coronary artery calcifications.  Calcifications of the mitral valve annulus and within the aortic aortic valve leaflets.  No pericardial effusion.  Symmetric dependent subpleural ground-glass atelectasis.  No focal airspace opacities.  No pleural effusion or pneumothorax.  The central pulmonary airways are widely patent.  No definite mediastinal, hilar or axillary lymphadenopathy.  No acute or aggressive osseous abnormalities within the chest.  IMPRESSION: The entirety of a markedly distended stomach is seen within the large hiatal hernia.  This does not result in enteric obstruction as ingested enteric contrast passes through the  stomach and is seen within the duodenum and proximal small bowel.  CT ABDOMEN AND PELVIS  Findings:  The lack of intravenous contrast limits the ability to evaluate solid abdominal organs.  Normal hepatic contour. There is a large (approximately 1.5 x 1.7 cm stone within the neck of a distended gallbladder.  There is a minimal amount of apparent gallbladder wall thickening (measuring approximately 7 mm in diameter (image 41, series 2).  These findings are also associated with a minimal amount of apparent pericholecystic fluid on this noncontrast examination.  The common bile duct is dilated  measuring approximately 9 mm in short axis diameter (image 35, series 2).  No definite intrahepatic biliary ductal dilatation on this noncontrast examination. There is a trace amount of fluid adjacent to the inferior aspect of the right lobe of the liver.  Normal noncontrast appearance of the bilateral kidneys.  Possible vascular calcification within the right renal hilum.  No definite renal stones or evidence of urinary obstruction.  No definite perinephric stranding.  Normal noncontrast appearance of the bilateral adrenal glands, pancreas and spleen.  Large hiatal hernia as described in the preceding chest CT.  The rectum and distal sigmoid colon is mildly patulous.  The bowel is otherwise normal in course and caliber without wall thickening or evidence of obstruction.  No pneumoperitoneum, pneumatosis or portal venous gas.  Moderate amount of calcified atherosclerotic plaque within a mildly tortuous and normal caliber abdominal aorta.  Right external iliac pelvic sidewall lymphadenopathy has increased in the interval with index no conglomeration now measuring approximately 4.6 x 2.4 cm (image 65, series 2, previously, 4.3 x 2.3 cm.  Interval development of both the right inguinal lymphadenopathy with index no conglomeration now measuring approximately 4.1 x 2.5 cm.  No definite retroperitoneal or mesenteric adenopathy on this noncontrast examination.  Post hysterectomy.  No discrete adnexal lesion.  No free fluid in the pelvis.  IMPRESSION:  1.  Cholelithiasis with findings worrisome for acute cholecystitis. Further evaluation with a HIDA scan may be performed as clinically indicated.  2. Interval progression of right external iliac and inguinal lymphadenopathy again worrisome for malignancy.  If not previously performed, percutaneous sampling may be obtained as indicated.  Above findings discussed with Dr. Rhunette Croft at 2039.   Original Report Authenticated By: Tacey Ruiz, MD    Ct Chest Wo  Contrast  02/03/2012  *RADIOLOGY REPORT*  Clinical Data: Abdominal pain, evaluate hiatal hernia, chest pain, decreased oxygen saturation, nausea for 1  CT CHEST, ABDOMEN AND PELVIS WITHOUT CONTRAST  Technique:  Multidetector CT imaging of the chest, abdomen and pelvis was performed following the standard protocol without IV contrast.  Comparison:  CT chest, abdomen pelvis - 03/16/2009; gallbladder ultrasound - earlier same day  CT CHEST  Findings:  Previously noted large hiatal hernia previously contained the stomach as well as a short segments of transverse colon.  The large hiatal hernia and now contains the entirety of a markedly distended stomach.  There is no definitive evidence of obstruction, as ingested enteric contrast passes through the stomach and into the duodenum and proximal small bowel.  The large hiatal hernia is again noted to result in mass effect upon the heart, in particular the left atrium.  Normal heart size.  Coronary artery calcifications.  Calcifications of the mitral valve annulus and within the aortic aortic valve leaflets.  No pericardial effusion.  Symmetric dependent subpleural ground-glass atelectasis.  No focal airspace opacities.  No pleural effusion or pneumothorax.  The central pulmonary  airways are widely patent.  No definite mediastinal, hilar or axillary lymphadenopathy.  No acute or aggressive osseous abnormalities within the chest.  IMPRESSION: The entirety of a markedly distended stomach is seen within the large hiatal hernia.  This does not result in enteric obstruction as ingested enteric contrast passes through the stomach and is seen within the duodenum and proximal small bowel.  CT ABDOMEN AND PELVIS  Findings:  The lack of intravenous contrast limits the ability to evaluate solid abdominal organs.  Normal hepatic contour. There is a large (approximately 1.5 x 1.7 cm stone within the neck of a distended gallbladder.  There is a minimal amount of apparent gallbladder  wall thickening (measuring approximately 7 mm in diameter (image 41, series 2).  These findings are also associated with a minimal amount of apparent pericholecystic fluid on this noncontrast examination.  The common bile duct is dilated measuring approximately 9 mm in short axis diameter (image 35, series 2).  No definite intrahepatic biliary ductal dilatation on this noncontrast examination. There is a trace amount of fluid adjacent to the inferior aspect of the right lobe of the liver.  Normal noncontrast appearance of the bilateral kidneys.  Possible vascular calcification within the right renal hilum.  No definite renal stones or evidence of urinary obstruction.  No definite perinephric stranding.  Normal noncontrast appearance of the bilateral adrenal glands, pancreas and spleen.  Large hiatal hernia as described in the preceding chest CT.  The rectum and distal sigmoid colon is mildly patulous.  The bowel is otherwise normal in course and caliber without wall thickening or evidence of obstruction.  No pneumoperitoneum, pneumatosis or portal venous gas.  Moderate amount of calcified atherosclerotic plaque within a mildly tortuous and normal caliber abdominal aorta.  Right external iliac pelvic sidewall lymphadenopathy has increased in the interval with index no conglomeration now measuring approximately 4.6 x 2.4 cm (image 65, series 2, previously, 4.3 x 2.3 cm.  Interval development of both the right inguinal lymphadenopathy with index no conglomeration now measuring approximately 4.1 x 2.5 cm.  No definite retroperitoneal or mesenteric adenopathy on this noncontrast examination.  Post hysterectomy.  No discrete adnexal lesion.  No free fluid in the pelvis.  IMPRESSION:  1.  Cholelithiasis with findings worrisome for acute cholecystitis. Further evaluation with a HIDA scan may be performed as clinically indicated.  2. Interval progression of right external iliac and inguinal lymphadenopathy again worrisome  for malignancy.  If not previously performed, percutaneous sampling may be obtained as indicated.  Above findings discussed with Dr. Rhunette Croft at 2039.   Original Report Authenticated By: Tacey Ruiz, MD    Nm Hepatobiliary  02/03/2012  *RADIOLOGY REPORT*  Clinical Data:  Evaluate for cholecystitis. Gallbladder distention and gallstones.  NUCLEAR MEDICINE HEPATOBILIARY IMAGING  Technique:  Sequential images of the abdomen were obtained out to 60 minutes following intravenous administration of radiopharmaceutical.  Radiopharmaceutical:  6.72mCi Tc-22m Choletec  Comparison:  Abdominal CT 02/02/2012  Findings: The radiopharmaceutical was taken up by the liver and excreted into the biliary system.  Activity is identified in the small bowel.  There is no filling of the gallbladder at 60 minutes.  Another 60 minutes of imaging was performed after the administration of 2 mg of morphine.  There is no filling of the gallbladder after the administration of morphine.  IMPRESSION: The gallbladder does not fill on this examination.  Findings are consistent with the cystic duct obstruction and cholecystitis.   Original Report Authenticated By: Richarda Overlie,  M.D.    US Abdomen Complete  02/02/2012  **ADDENDUM** CREATED: 02/02/2012 20:43:18  In hind sight, there is a minimal amount of gallbladder wall thickening seen on images 75-77.  Again, this finding is without associated pericholecystic fluid or a sonographic Murphy's sign and remains indeterminate for acute cholecystitis.  Further evaluation with a HIDA scan may be performed as clinically indicated.  **END ADDENDUM** SIGNED BY: Dyanne Carrel, MD   02/02/2012  *RADIOLOGY REPORT*  Clinical Data:  Abdominal pain  COMPLETE ABDOMINAL ULTRASOUND  Comparison:  CT abdomen pelvis - 03/16/2009  Findings:  Gallbladder:  There are at least three shadowing echogenic gallstones within an otherwise normal-appearing gallbladder, the largest of which measures approximately 2 cm  in diameter.  No gallbladder wall thickening or pericholecystic fluid.  Negative sonographic Murphy's sign.  Common bile duct:  Normal in size for age, measuring approximately 5.4 mm in diameter.  Liver:  There is mild diffuse slightly coarsened heterogeneous echotexture of the hepatic parenchyma.  No discrete intrahepatic lesions.  No intrahepatic biliary duct dilatation.  No ascites.  IVC:  Appears normal.  Pancreas:  Limited visualization of the pancreatic head and neck is normal.  Visualization of the pancreatic body and tail is obscured by bowel gas.  Spleen:  Normal in size measuring 6.8 cm in length.  Right Kidney:  Normal cortical thickness, echogenicity and size, measuring 8.8 cm in length.  No focal renal lesions.  No echogenic renal stones.  No urinary obstruction.  Left Kidney:  Normal cortical thickness, echogenicity and size, measuring 10.6 cm in length.  No focal renal lesions.  No echogenic renal stones.  No urinary obstruction.  Abdominal aorta:  No aneurysm identified.  IMPRESSION: 1.  Cholelithiasis without evidence of cholecystitis.  Note, gallstones were seen on remote abdominal CT performed 03/2009.  2. Mild nonspecific coarsening of the hepatic parenchymal echotexture without discrete hepatic lesion.  Original Report Authenticated By: Tacey Ruiz, MD    Dg Abd 2 Views  02/02/2012  *RADIOLOGY REPORT*  Clinical Data: Right upper quadrant abdominal pain, shortness of breath  ABDOMEN - 2 VIEW  Comparison: None.  Findings:  There is a large air and fluid containing retrocardiac structure which occupies the majority of the right lower hemithorax favored to represent a large hiatal hernia.  In the absence of prior examinations, the stability of this finding is indeterminate.  There is a minimal amount of distal colonic gas.  Otherwise, there is a paucity of colonic gas.  No definite pneumoperitoneum, pneumatosis or portal venous gas.  Vascular calcifications.  Moderate to severe multilevel  thoracolumbar spine degenerative change.  IMPRESSION: Large hiatal hernia containing the majority of the stomach.  In the absence of prior examinations, the stability of this finding is indeterminate.  In the setting of acute right upper quadrant abdominal pain, a gastric volvulus could have a similar appearance. Clinical correlation is advised.   Original Report Authenticated By: Tacey Ruiz, MD     Anti-infectives: Anti-infectives     Start     Dose/Rate Route Frequency Ordered Stop   02/03/12 0600   ampicillin-sulbactam (UNASYN) 1.5 g in sodium chloride 0.9 % 50 mL IVPB        1.5 g 100 mL/hr over 30 Minutes Intravenous Every 6 hours 02/02/12 2306     02/02/12 2100   Ampicillin-Sulbactam (UNASYN) 3 g in sodium chloride 0.9 % 100 mL IVPB        3 g 100 mL/hr over 60 Minutes  Intravenous  Once 02/02/12 2055 02/02/12 2310           Assessment/Plan  1. Acute cholecystitis  Non viz gall bladder on HIDA scan - 02/02/2102, US showed multiple gall stones - 02/02/2012, and CT scan suggested cholecystitis - 02/02/2012. 2. Large hiatal hernia  3. CAD and valvular disease. 4. RA, on chronic steroids  Patient Active Problem List  Diagnosis  . HYPERTENSION  . Mitral regurgitation  . Carotid artery disease  . Drug therapy  . Clot  . Ejection fraction  . Lung disease, interstitial  . Aortic valve sclerosis  . CHF (congestive heart failure)  . Dyslipidemia  . CAD (coronary artery disease)  . Rheumatoid arthritis on chronic steroids  . Hiatal hernia  . Lymphadenopathy, abdominal  . Cholelithiasis  . Abdominal pain   Plan: 1. The patient has 3 options, abx therapy, surgery, perc drain.  The patient really wants to avoid an operation if possible, but she doesn't know what option is best.  Her son came by last night, but they haven't made any decisions.  I will call the son today and try to get in touch with him so we can make some decisions. 2. Cardiology to evaluate to determine  cardiac clearance incase surgery is pursued. 3. Npo for now 4. Hold heparin in case we can proceed with some type of procedure today.   LOS: 2 days   OSBORNE,KELLY E 02/04/2012, 7:37 AM Pager: [phone]  ADDENDUM: Cardiology has evaluated the patient.  The patient prefers to proceed with a perc chole drain and avoid surgery.  I have had a lengthy d/w the patient and her son about all of her options.  They understand each and wish to proceed with a perc chole drain.  I will write an order for this and keep her NPO and hold heparin for today in case IR is able to do this today.  OSBORNE,KELLY E  Agree with above. Discussed plan with the patient.  She is even vacillating on perc drain. Will continue to follow.  Ovidio Kin, MD, [facility] Pager: [phone] Office phone:  [phone]

## 2012-02-04 NOTE — Progress Notes (Signed)
  Echocardiogram 2D Echocardiogram has been performed.  [name] 02/04/2012, 10:24 AM

## 2012-02-04 NOTE — Progress Notes (Signed)
CRITICAL VALUE ALERT  Critical value received:  Blood cultures aerobic bottle gram negative rods  Date of notification:  02/04/12   Time of notification:  0815  Critical value read back:yes  Nurse who received alert:  [email or web address]  MD notified (1st page):  Dr. Darnelle Catalan  Time of first page:  0815  MD notified (2nd page):  Time of second page:  Responding MD:  Rama  Time MD responded:  [phone]

## 2012-02-05 ENCOUNTER — Other Ambulatory Visit: Payer: Self-pay | Admitting: Interventional Radiology

## 2012-02-05 ENCOUNTER — Inpatient Hospital Stay (HOSPITAL_COMMUNITY): Payer: Medicare Other

## 2012-02-05 DIAGNOSIS — A419 Sepsis, unspecified organism: Secondary | ICD-10-CM

## 2012-02-05 DIAGNOSIS — R7881 Bacteremia: Secondary | ICD-10-CM | POA: Diagnosis present

## 2012-02-05 DIAGNOSIS — E876 Hypokalemia: Secondary | ICD-10-CM | POA: Diagnosis present

## 2012-02-05 DIAGNOSIS — K8000 Calculus of gallbladder with acute cholecystitis without obstruction: Secondary | ICD-10-CM

## 2012-02-05 DIAGNOSIS — D649 Anemia, unspecified: Secondary | ICD-10-CM | POA: Diagnosis present

## 2012-02-05 LAB — BASIC METABOLIC PANEL
Calcium: 8 mg/dL — ABNORMAL LOW (ref 8.4–10.5)
Chloride: 108 mEq/L (ref 96–112)
Creatinine, Ser: 0.44 mg/dL — ABNORMAL LOW (ref 0.50–1.10)
GFR calc Af Amer: >90 mL/min (ref >90)
Sodium: 136 mEq/L (ref 135–145)

## 2012-02-05 LAB — CBC
Platelets: 120 10*3/uL — ABNORMAL LOW (ref 150–400)
RBC: 2.87 MIL/uL — ABNORMAL LOW (ref 3.87–5.11)
RDW: 14.8 % (ref 11.5–15.5)
WBC: 9.8 10*3/uL (ref 4.0–10.5)

## 2012-02-05 LAB — CULTURE, BLOOD (ROUTINE X 2)

## 2012-02-05 MED ORDER — FENTANYL CITRATE 0.05 MG/ML IJ SOLN
INTRAMUSCULAR | Status: AC | PRN
  Administered 2012-02-05: 25 ug via INTRAVENOUS

## 2012-02-05 MED ORDER — POTASSIUM CHLORIDE CRYS ER 20 MEQ PO TBCR
40.0000 meq | EXTENDED_RELEASE_TABLET | Freq: Once | ORAL | Status: AC
  Administered 2012-02-05: 40 meq via ORAL
  Filled 2012-02-05: qty 2

## 2012-02-05 MED ORDER — LIDOCAINE HCL 1 % IJ SOLN
INTRAMUSCULAR | Status: AC
  Filled 2012-02-05: qty 20

## 2012-02-05 MED ORDER — POTASSIUM CHLORIDE 20 MEQ PO PACK: 40.0000 meq | PACK | Freq: Once | ORAL | Status: DC

## 2012-02-05 MED ORDER — MIDAZOLAM HCL 2 MG/2ML IJ SOLN
INTRAMUSCULAR | Status: AC
  Filled 2012-02-05: qty 6

## 2012-02-05 MED ORDER — POTASSIUM CHLORIDE IN NACL 40-0.9 MEQ/L-% IV SOLN
INTRAVENOUS | Status: DC
  Administered 2012-02-05 – 2012-02-07 (×4): via INTRAVENOUS
  Filled 2012-02-05 (×7): qty 1000

## 2012-02-05 MED ORDER — FENTANYL CITRATE 0.05 MG/ML IJ SOLN
INTRAMUSCULAR | Status: AC
  Filled 2012-02-05: qty 6

## 2012-02-05 MED ORDER — MIDAZOLAM HCL 2 MG/2ML IJ SOLN
INTRAMUSCULAR | Status: AC | PRN
Start: 2012-02-05 — End: 2012-02-05
  Administered 2012-02-05: 0.5 mg via INTRAVENOUS

## 2012-02-05 NOTE — Procedures (Signed)
Interventional Radiology Procedure Note  Procedure: Placement of a 69F transhepatic percutaneous cholecystostomy tube for acute on chronic calculous cholecystitis.  Purulent bile aspirated and sent for culture. Complications: None Recommendations: - Maintain drain to gravity drainage - Cultures pending - Drain teaching - Return to IR in 4-6 weeks for 1st chole tube check and change - Given calculous cholecystitis, pt likely to have tube for life or until she is able and willing to undergo definitive elective cholecystectomy  Signed,  Sterling Big, MD Vascular & Interventional Radiologist [facility]

## 2012-02-05 NOTE — Progress Notes (Signed)
[facility] PROGRESS NOTE  [name] ZOX:[id number] DOB: 1925-09-18 DOA: 02/02/2012 PCP: Tula Nakayama, MD Cardiologist: Dr. Myrtis Ser  Brief narrative: [name] is an 78 year old female with a past medical history of coronary artery disease, aortic sclerosis, MR, CHF, RA on chronic steroids and hypertension who was admitted to the hospital on 02/02/2012 with acute cholecystitis.  Cardiology consulted for cardiac clearance prior to possible surgery. Treatment team ultimately decided to perform a percutaneous cholecystostomy for drainage of the gallbladder rather than surgical intervention.  Assessment/Plan: Principal Problem:  *Acute cholecystitis / E. Coli bacteremia  Admitted and surgical consultation obtained. Dr. Johna Sheriff evaluated her on 02/02/2012.  A CT scan was obtained which did not show any evidence of cholecystitis but did show a large stone in the gallbladder and minimal gallbladder wall thickening. Additionally, the common bile duct was dilated.  Abdominal ultrasound also performed on 02/02/2012 which showed cholelithiasis without evidence of cholecystitis, and a negative sonographic Murphy's sign. HIDA scan subsequently done which did show cystic duct obstruction and acute cholecystitis. BCUX x 1 growing E. Coli.  F/U final blood cultures.  Seen by cardiology for preoperative evaluation, and ultimately felt to be a poor operative candidate. After a discussion with patient and her family, percutaneous cholecystostomy drainage felt to be best option.  Continue empiric Unasyn. Active Problems:  Normocytic anemia  Hemoglobin trending down. Likely anemia of chronic disease given history of rheumatoid arthritis.  Hypokalemia  We'll add potassium to IV fluids.  Mitral regurgitation /  Aortic valve sclerosis  Murmur heard on exam.  Lung disease, interstitial  No current complaints of shortness of breath.  CHF (congestive heart failure)  Clinically well  compensated at present.  CAD (coronary artery disease)  Continue statin and beta blocker.  2 D Echo done 02/04/12. EF 65% with high ventricular filling pressures.   Rheumatoid arthritis on chronic steroids  Placed on stress dose steroids upon admission. Arava placed on hold.  No evidence of adrenal crisis. Blood pressure stable. Discontinued stress dose steroids 02/04/12 and resumed home dose of prednisone.  Hiatal hernia  Continue Protonix.  Abdominal lymphadenopathy  Findings on CT scan worrisome for malignancy.  CEA was within normal limits, CA 125 only mildly elevated.  Will need this worked up further when stable.  Code Status: Full Family Communication: None at bedside.  Disposition Plan: Home, when stable.   Medical Consultants:  Dr. Glenna Fellows, surgery  Dr. Shawnie Pons, Cardiology  Dr. Malachy Moan, IR  Other Consultants:  None.  Anti-infectives:  Unasyn 02/02/2012--->  HPI/Subjective: [name] is having less abdominal discomfort status post drain placement.  She has very little appetite and feels weak.  Objective: Filed Vitals:   02/05/12 0719 02/05/12 0720 02/05/12 0725 02/05/12 0735  BP: 153/72 150/71 149/68 138/65  Pulse: 118 118 118 111  Temp:      TempSrc:      Resp: 28 27 28  32  Height:      Weight:      SpO2: 93% 91% 92% 93%    Intake/Output Summary (Last 24 hours) at 02/05/12 0749 Last data filed at 02/05/12 8119  Gross per 24 hour  Intake 2711.25 ml  Output    875 ml  Net 1836.25 ml    Exam: Gen:  NAD Cardiovascular:  RRR, 2/6 systolic ejection murmur Respiratory:  Lungs CTAB Gastrointestinal:  Abdomen soft, right upper quadrant tenderness Extremities:  No C/E/C  Data Reviewed: Basic Metabolic Panel:  Lab 02/05/12 1478 02/02/12 1630  NA 136  135  K 2.9* 3.9  CL 108 98  CO2 22 25  GLUCOSE 105* 101*  BUN 7 13  CREATININE 0.44* 0.50  CALCIUM 8.0* 9.3  MG -- 2.0  PHOS -- --   GFR Estimated Creatinine  Clearance: 40.2 ml/min (by C-G formula based on Cr of 0.44). Liver Function Tests:  Lab 02/03/12 0346 02/02/12 1630  AST 26 41*  ALT 9 11  ALKPHOS 69 83  BILITOT 0.4 0.5  PROT 5.4* 7.0  ALBUMIN 2.7* 3.6    Lab 02/02/12 1630  LIPASE 36  AMYLASE --   Coagulation profile  Lab 02/02/12 1630  INR 0.97  PROTIME --    CBC:  Lab 02/05/12 0501 02/02/12 1630 02/02/12 1516  WBC 9.8 11.6* 12.8*  NEUTROABS -- 9.2* --  HGB 9.1* 11.9* 12.5  HCT 27.9* 36.9 41.7  MCV 97.2 97.6 100.4*  PLT 120* 185 --   Cardiac Enzymes:  Lab 02/02/12 2015 02/02/12 1630  CKTOTAL -- --  CKMB -- --  CKMBINDEX -- --  TROPONINI <0.30 <0.30   Thyroid function studies  Basename 02/02/12 2015  TSH 0.774  T4TOTAL --  T3FREE --  THYROIDAB --   Microbiology Recent Results (from the past 240 hour(s))  URINE CULTURE     Status: Normal   Collection Time   02/02/12  3:20 PM      Component Value Range Status Comment   Culture ESCHERICHIA COLI   Final    Colony Count >=100,000 COLONIES/ML   Final    Organism ID, Bacteria ESCHERICHIA COLI   Final   CULTURE, BLOOD (ROUTINE X 2)     Status: Normal (Preliminary result)   Collection Time   02/02/12  9:16 PM      Component Value Range Status Comment   Specimen Description BLOOD RIGHT FOREARM   Final    Special Requests BOTTLES DRAWN AEROBIC AND ANAEROBIC 3CC   Final    Culture  Setup Time 02/03/2012 00:26   Final    Culture     Final    Value: ESCHERICHIA COLI     Note: Gram Stain Report Called to,Read Back By and Verified With: COURTNEY K @ 0816 02/04/12 BY KRAWS   Report Status PENDING   Incomplete   CULTURE, BLOOD (ROUTINE X 2)     Status: Normal (Preliminary result)   Collection Time   02/02/12 10:03 PM      Component Value Range Status Comment   Specimen Description BLOOD RIGHT ARM   Final    Special Requests BOTTLES DRAWN AEROBIC ONLY    Final    Culture  Setup Time 02/03/2012 00:26   Final    Culture     Final    Value:        BLOOD  CULTURE RECEIVED NO GROWTH TO DATE CULTURE WILL BE HELD FOR 5 DAYS BEFORE ISSUING A FINAL NEGATIVE REPORT   Report Status PENDING   Incomplete   MRSA PCR SCREENING     Status: Normal   Collection Time   02/02/12 11:03 PM      Component Value Range Status Comment   MRSA by PCR NEGATIVE  NEGATIVE Final      Procedures and Diagnostic Studies:  Nm Hepatobiliary  02/03/2012  *RADIOLOGY REPORT*  Clinical Data:  Evaluate for cholecystitis. Gallbladder distention and gallstones.  NUCLEAR MEDICINE HEPATOBILIARY IMAGING  Technique:  Sequential images of the abdomen were obtained out to 60 minutes following intravenous administration of radiopharmaceutical.  Radiopharmaceutical:  6.65mCi  Tc-34m Choletec  Comparison:  Abdominal CT 02/02/2012  Findings: The radiopharmaceutical was taken up by the liver and excreted into the biliary system.  Activity is identified in the small bowel.  There is no filling of the gallbladder at 60 minutes.  Another 60 minutes of imaging was performed after the administration of 2 mg of morphine.  There is no filling of the gallbladder after the administration of morphine.  IMPRESSION: The gallbladder does not fill on this examination.  Findings are consistent with the cystic duct obstruction and cholecystitis.   Original Report Authenticated By: Richarda Overlie, M.D.    Scheduled Meds:    . amitriptyline  50 mg Oral QHS  . ampicillin-sulbactam (UNASYN) IV  1.5 g Intravenous Q6H  . atorvastatin  20 mg Oral q1800  . calcium-vitamin D  1 tablet Oral Daily  . fentaNYL      . heparin  5,000 Units Subcutaneous Q8H  . lidocaine      . metoprolol succinate  100 mg Oral Daily  . midazolam      . pantoprazole  40 mg Oral Daily  . predniSONE  5 mg Oral Daily  . sodium chloride  3 mL Intravenous Q12H   Continuous Infusions:    . dextrose 5 % and 0.9% NaCl 75 mL/hr at 02/05/12 5284    Time spent: 35 minutes.   LOS: 3 days   RAMA,CHRISTINA  [facility] Pager [phone].    If 8PM-8AM, please contact night-coverage at [email or web address], password [facility] 02/05/2012, 7:49 AM

## 2012-02-05 NOTE — Progress Notes (Signed)
General Surgery Note  LOS: 3 days  Room - 1411  Assessment/Plan: 1.  Cholecystitis - perc drain - 02/05/2012  Doing okay, though sore.  On Unasyn  [name], [name], in room and I tried to answer questions about the perc drain long term.  The options include - leave drain long term (most likely scenario), plan elective cholecystectomy in the future, or pull the drain and see how she does (would not pull drain for at least 3 months)  If temp becomes normal - she can probably go home early next week.  2.  Mitral regurgitation  3.  Lung disease, interstitial  4.  Aortic valve sclerosis  5.  History of CHF (congestive heart failure)   CAD (coronary artery disease)  Rheumatoid arthritis on chronic steroids  Hiatal hernia , giant - asymptomatic Lymphadenopathy, abdominal   Right iliac and inguinal area  Hypokalemia  Normocytic anemia  -Hgb - 9.1 - 02/05/2012  DVT prophylaxis - SQ Heparin  Subjective:  Sore, but eating reg food.  Drain with bile.  Objective:   Filed Vitals:   02/05/12 0735  BP: 138/65  Pulse: 111  Temp:   Resp: 32     Intake/Output from previous day:  12/27 0701 - 12/28 0700 In: 2711.3 [P.O.:740; I.V.:1771.3; IV Piggyback:200] Out: 875 [Urine:875]  Intake/Output this shift:      Physical Exam:   General: Older frain WF who is alert and oriented.    HEENT: Normal. Pupils equal. .   Lungs: Clear.   Abdomen: Soft.  Drain from RUQ.   Lab Results:    [facility] 02/05/12 0501 02/02/12 1630  WBC 9.8 11.6*  HGB 9.1* 11.9*  HCT 27.9* 36.9  PLT 120* 185    BMET   Basename 02/05/12 0501 02/02/12 1630  NA 136 135  K 2.9* 3.9  CL 108 98  CO2 22 25  GLUCOSE 105* 101*  BUN 7 13  CREATININE 0.44* 0.50  CALCIUM 8.0* 9.3    PT/INR   Basename 02/02/12 1630  LABPROT 12.8  INR 0.97    ABG  No results found for this basename: PHART:2,PCO2:2,PO2:2,HCO3:2 in the last 72 hours   Studies/Results:  Nm Hepatobiliary  02/03/2012  *RADIOLOGY REPORT*  Clinical  Data:  Evaluate for cholecystitis. Gallbladder distention and gallstones.  NUCLEAR MEDICINE HEPATOBILIARY IMAGING  Technique:  Sequential images of the abdomen were obtained out to 60 minutes following intravenous administration of radiopharmaceutical.  Radiopharmaceutical:  6.31mCi Tc-82m Choletec  Comparison:  Abdominal CT 02/02/2012  Findings: The radiopharmaceutical was taken up by the liver and excreted into the biliary system.  Activity is identified in the small bowel.  There is no filling of the gallbladder at 60 minutes.  Another 60 minutes of imaging was performed after the administration of 2 mg of morphine.  There is no filling of the gallbladder after the administration of morphine.  IMPRESSION: The gallbladder does not fill on this examination.  Findings are consistent with the cystic duct obstruction and cholecystitis.   Original Report Authenticated By: Richarda Overlie, M.D.      Anti-infectives:   Anti-infectives     Start     Dose/Rate Route Frequency Ordered Stop   02/03/12 0600   ampicillin-sulbactam (UNASYN) 1.5 g in sodium chloride 0.9 % 50 mL IVPB        1.5 g 100 mL/hr over 30 Minutes Intravenous Every 6 hours 02/02/12 2306     02/02/12 2100   Ampicillin-Sulbactam (UNASYN) 3 g in sodium chloride 0.9 %  100 mL IVPB        3 g 100 mL/hr over 60 Minutes Intravenous  Once 02/02/12 2055 02/02/12 2310          Ovidio Kin, MD, FACS Pager: [phone],   [facility]: [phone] 02/05/2012

## 2012-02-05 NOTE — Progress Notes (Signed)
Patient ID: [name], female   DOB: 1925-11-05, 78 y.o.   MRN: [id number] Stable this am. Echo shows moderate aortic stenosis and moderate pulmonary hypertension. Telemetry is sinus tachycardia. Per RN has received her po meds this am including metoprolol. Potassium is low. Will supplement.

## 2012-02-06 DIAGNOSIS — R7881 Bacteremia: Secondary | ICD-10-CM

## 2012-02-06 DIAGNOSIS — I359 Nonrheumatic aortic valve disorder, unspecified: Secondary | ICD-10-CM

## 2012-02-06 DIAGNOSIS — A498 Other bacterial infections of unspecified site: Secondary | ICD-10-CM

## 2012-02-06 LAB — BASIC METABOLIC PANEL
BUN: 7 mg/dL (ref 6–23)
CO2: 21 mEq/L (ref 19–32)
Chloride: 108 mEq/L (ref 96–112)
GFR calc Af Amer: >90 mL/min (ref >90)
Potassium: 4.3 mEq/L (ref 3.5–5.1)

## 2012-02-06 LAB — CBC
HCT: 29.6 % — ABNORMAL LOW (ref 36.0–46.0)
Hemoglobin: 9.5 g/dL — ABNORMAL LOW (ref 12.0–15.0)
MCV: 96.1 fL (ref 78.0–100.0)
WBC: 6.4 10*3/uL (ref 4.0–10.5)

## 2012-02-06 NOTE — Progress Notes (Signed)
General Surgery Note  LOS: 4 days  Room - 1411  Assessment/Plan: 1.  Cholecystitis - perc drain - 02/05/2012  Less sore today.  On Unasyn  Probably home early this week.  2.  Mitral regurgitation  3.  Lung disease, interstitial  4.  Aortic stenosis and moderate pulmonary hypertension by echo. 5.  History of CHF (congestive heart failure)   6.  CAD (coronary artery disease)  7.  Rheumatoid arthritis on chronic steroids  8.  Hiatal hernia , giant - asymptomatic 9.  Lymphadenopathy, abdominal   Right iliac and inguinal area  11.  Normocytic anemia  - Hgb - 9.5 - 02/06/2012 12.  DVT prophylaxis - SQ Heparin  Subjective:  Less sore today.  Drain with bile.  Objective:   Filed Vitals:   02/06/12 0551  BP: 131/48  Pulse: 94  Temp: 97.8 F (36.6 C)  Resp: 16     Intake/Output from previous day:  12/28 0701 - 12/29 0700 In: 2048.8 [P.O.:600; I.V.:1298.8; IV Piggyback:150] Out: 2250 [Urine:1900; Drains:350]  Intake/Output this shift:  Total I/O In: -  Out: 350 [Urine:350]   Physical Exam:   General: Older frain WF who is alert and oriented.    HEENT: Normal. Pupils equal. .   Lungs: Clear.   Abdomen: Soft.  Drain from RUQ.   Lab Results:     [facility] 02/06/12 0525 02/05/12 0501  WBC 6.4 9.8  HGB 9.5* 9.1*  HCT 29.6* 27.9*  PLT 162 120*    BMET    Basename 02/06/12 0525 02/05/12 0501  NA 139 136  K 4.3 2.9*  CL 108 108  CO2 21 22  GLUCOSE 90 105*  BUN 7 7  CREATININE 0.43* 0.44*  CALCIUM 8.5 8.0*    PT/INR  No results found for this basename: LABPROT:2,INR:2 in the last 72 hours  ABG  No results found for this basename: PHART:2,PCO2:2,PO2:2,HCO3:2 in the last 72 hours   Studies/Results:  Ir Perc Cholecystostomy  02/05/2012  *RADIOLOGY REPORT*  IR PERCUTANEOUS CHOLECYSTOSTOMY TUBE PLACEMENT  Date: 02/05/2012  Clinical History: 78 year old female with a history of biliary colic and current admission for acute on chronic cholecystitis diagnosed by  nuclear medicine HIDA scan. She has a marginal surgical candidate prefers to avoid surgery at this time if possible.  She presents for placement of a percutaneous cholecystostomy tube.  Procedures Performed: 1. Ultrasound-guided puncture of the gallbladder transhepatic fashion 2.  Placement of a 10-French cholecystostomy tube under fluoroscopic guidance 3.  Limited cholangiogram  Interventional Radiologist:  Sterling Big, MD  Sedation: Moderate (conscious) sedation was used.  One mg Versed, 50 mcg Fentanyl were administered intravenously.  The patient's vital signs were monitored continuously by radiology nursing throughout the procedure.  Sedation Time: 20 minutes  Fluoroscopy time: 1.3 minutes  Contrast volume: 15 ml Omnipaque-300 administered into the gallbladder lumen  PROCEDURE/FINDINGS:   Informed consent was obtained from the patient following explanation of the procedure, risks, benefits and alternatives. The patient understands, agrees and consents for the procedure. All questions were addressed. A time out was performed.  Maximal barrier sterile technique utilized including caps, mask, sterile gowns, sterile gloves, large sterile drape, hand hygiene, and betadine skin prep.  The right hemiabdomen was interrogated with ultrasound.  As seen on prior CT, she has a relatively low- lying liver and in inferiorly-directed gallbladder which comes all the way down to the iliac crest.  A suitable window passing through a small amount of peripheral liver into the gallbladder lumen  was selected and marked.  Local anesthesia was achieved by infiltration of 1% lidocaine.  Under direct ultrasound guidance, a 21 gauge Accustick needle was advanced through the body wall, hepatic parenchyma and into the gallbladder lumen.  An image was saved and stored for the medical record.  A micro wire was coiled in the gallbladder lumen and the Accustick sheath advanced over the wire. Correct placement was confirmed by immediate  return of purulent biliary fluid.  An Amplatz wire was then advanced into the gallbladder lumen and the tract serially dilated to 10-French. After this, a Cook all-purpose drainage catheter was advanced over the wire and positioned within the gallbladder lumen.  The locking pigtail loop was formed.  Aspiration of 20 ml of purulent brown biliary fluid containing debris was successfully aspirated.  A gentle hand injection of contrast material confirms tube placement within the gallbladder as well as a large filling defect in the gallbladder neck consistent with an impacted stone.  The drain was secured to the skin with Prolene suture, bumper and an adhesive fixation device.  There is no immediate complication, the patient tolerated the procedure well.  The tube was connected to gravity bag drainage.  IMPRESSION:  1.  Successful placement of a percutaneous transhepatic 10-French cholecystostomy tube with return of purulent appearing in debris filled bowel.  A sample was sent to the laboratory for culture.  Drain management:  The patient and her son are aware that given her calculus acute on chronic cholecystitis she may require long-term care and maintenance of her new cholecystostomy tube until she is both fit and willing to undergo interval cholecystectomy.  She will return interventional radiology in 4 - 6 weeks for her first tube check and change. After that, she will likely require routine tube check and changes every 6 - 8 weeks.  While it is possible to remove the tube after her acute episode has resolved.  Patients with calculus cholecystitis have relatively high rate of recurrent cholecystitis.  Signed,  Sterling Big, MD Vascular & Interventional Radiologist [facility]  Original Report Authenticated By: Malachy Moan, M.D.      Anti-infectives:   Anti-infectives     Start     Dose/Rate Route Frequency Ordered Stop   02/03/12 0600   ampicillin-sulbactam (UNASYN) 1.5 g in sodium  chloride 0.9 % 50 mL IVPB        1.5 g 100 mL/hr over 30 Minutes Intravenous Every 6 hours 02/02/12 2306     02/02/12 2100   Ampicillin-Sulbactam (UNASYN) 3 g in sodium chloride 0.9 % 100 mL IVPB        3 g 100 mL/hr over 60 Minutes Intravenous  Once 02/02/12 2055 02/02/12 2310          Ovidio Kin, MD, FACS Pager: [phone],   [facility]: [phone] 02/06/2012

## 2012-02-06 NOTE — Progress Notes (Signed)
Patient ID: [name], female   DOB: 1925/08/23, 78 y.o.   MRN: [id number] [name] is without complaints this morning. Denies any chest pain, shortness of breath. Heart rate has decreased into the 90s in sinus rhythm. Blood pressure running 130/48. Sats 97%. No obvious JVD, aortic stenosis murmur present, and lungs are clear. There is no edema.  Potassium has normalized with supplementation. Hemoglobin is stable.  Medications reviewed. No additional recommendations.: Call if there are questions. We'll sign off.

## 2012-02-06 NOTE — Progress Notes (Signed)
[facility] PROGRESS NOTE  [name] ZOX:[id number] DOB: Mar 21, 1925 DOA: 02/02/2012 PCP: Tula Nakayama, MD  Brief narrative: 78 year-old female with a past medical history of coronary artery disease, aortic sclerosis, MR, CHF, RA on chronic steroids and hypertension who was admitted to the hospital on 02/02/2012 with acute cholecystitis. Cardiology consulted for cardiac clearance prior to possible surgery. Treatment team ultimately decided to perform a percutaneous cholecystostomy for drainage of the gallbladder rather than surgical intervention.   Assessment/Plan:   Principal Problem:  *Acute cholecystitis / E. Coli bacteremia  CT scan  did not show any evidence of cholecystitis but did show a large stone in the gallbladder and minimal gallbladder wall thickening. Additionally, the common bile duct was dilated. Abdominal ultrasound  on 12/25/2013showed cholelithiasis without evidence of cholecystitis, and a negative sonographic Murphy's sign. HIDA scan subsequently done which did show cystic duct obstruction and acute cholecystitis. Blood cultures x 1 set growing E.Coli; we will follow up on blood culture results from today to ensure resolution of bacteremia Status post percutaneous drainage 02/05/2012  Continue empiric Unasyn.  Active Problems:  Normocytic anemia  Likely anemia of chronic disease secondary to RA Hemoglobin stable at 9.5 Hypokalemia  Repleted in IV fluids. Mitral regurgitation / Aortic valve sclerosis  stable. Lung disease, interstitial  No complaints of shortness of breath. CHF (congestive heart failure)  Compensated at present. CAD (coronary artery disease)  Continue statin and beta blocker.  2 D Echo done 02/04/12. EF 65% with high ventricular filling pressures.  Rheumatoid arthritis on chronic steroids  Placed on stress dose steroids upon admission. Arava on hold.  No evidence of adrenal crisis. Blood pressure stable. Discontinued stress  dose steroids 02/04/12 and resumed home dose of prednisone. Hiatal hernia  Continue Protonix. Abdominal lymphadenopathy  Findings on CT scan worrisome for malignancy. CEA was within normal limits, CA 125 only mildly elevated. Further work up once patient stable.    Code Status: Full  Family Communication: None at bedside.  Disposition Plan: Home, when stable.    Medical Consultants:  Dr. Glenna Fellows, surgery  Dr. Shawnie Pons, Cardiology  Dr. Malachy Moan, IR Other Consultants:  None. Anti-infectives:  Unasyn 02/02/2012--->  Manson Passey, MD  [facility] Pager [phone]  If 7PM-7AM, please contact night-coverage [email or web address] Password [email or web address] 02/06/2012, 1:45 PM   LOS: 4 days   HPI/Subjective: No acute overnight events.  Objective: Filed Vitals:   02/05/12 1538 02/05/12 2205 02/06/12 0551 02/06/12 1007  BP: 134/66 132/55 131/48 138/59  Pulse: 97 102 94 121  Temp: 98.1 F (36.7 C) 99.4 F (37.4 C) 97.8 F (36.6 C)   TempSrc: Oral Oral Oral   Resp:  20 16   Height:      Weight:      SpO2: 95% 97% 97%     Intake/Output Summary (Last 24 hours) at 02/06/12 1345 Last data filed at 02/06/12 1300  Gross per 24 hour  Intake 2478.75 ml  Output   2601 ml  Net -122.25 ml    Exam:   General:  Pt is alert, follows commands appropriately, not in acute distress  Cardiovascular: Regular rate and rhythm, S1/S2, no murmurs, no rubs, no gallops  Respiratory: Clear to auscultation bilaterally, no wheezing, no crackles, no rhonchi  Abdomen: Non tender, perc drain (+),  bowel sounds present, no guarding  Extremities: No edema, pulses DP and PT palpable bilaterally  Neuro: Grossly nonfocal  Data Reviewed: Basic Metabolic Panel:  Lab 02/06/12 8295 02/05/12 0501 02/02/12 1630  NA  139 136 135  K 4.3 2.9* 3.9  CL 108 108 98  CO2 21 22 25   GLUCOSE 90 105* 101*  BUN 7 7 13   CREATININE 0.43* 0.44* 0.50  CALCIUM 8.5 8.0* 9.3  MG -- -- 2.0  PHOS -- -- --    Liver Function Tests:  Lab 02/03/12 0346 02/02/12 1630  AST 26 41*  ALT 9 11  ALKPHOS 69 83  BILITOT 0.4 0.5  PROT 5.4* 7.0  ALBUMIN 2.7* 3.6    Lab 02/02/12 1630  LIPASE 36  AMYLASE --   CBC:  Lab 02/06/12 0525 02/05/12 0501 02/02/12 1630 02/02/12 1516  WBC 6.4 9.8 11.6* 12.8*  NEUTROABS -- -- 9.2* --  HGB 9.5* 9.1* 11.9* 12.5  HCT 29.6* 27.9* 36.9 41.7  MCV 96.1 97.2 97.6 100.4*  PLT 162 120* 185 --   Cardiac Enzymes:  Lab 02/02/12 2015 02/02/12 1630  CKTOTAL -- --  CKMB -- --  CKMBINDEX -- --  TROPONINI <0.30 <0.30    URINE CULTURE     Status: Normal   Collection Time   02/02/12  3:20 PM      Component Value Range Status Comment   Organism ID, Bacteria ESCHERICHIA COLI   Final   CULTURE, BLOOD (ROUTINE X 2)     Status: Normal   Collection Time   02/02/12  9:16 PM      Component Value Range Status Comment   Culture     Final    Value: ESCHERICHIA COLI   Report Status 02/05/2012 FINAL   Final    Organism ID, Bacteria ESCHERICHIA COLI   Final   CULTURE, BLOOD (ROUTINE X 2)     Status: Normal (Preliminary result)   Collection Time   02/02/12 10:03 PM      Component Value Range Status Comment   Culture     Final    Value:        BLOOD CULTURE RECEIVED NO GROWTH TO DATE    Report Status PENDING   Incomplete   MRSA PCR SCREENING     Status: Normal   Collection Time   02/02/12 11:03 PM      Component Value Range Status Comment   MRSA by PCR NEGATIVE  NEGATIVE Final   BODY FLUID CULTURE     Status: Normal (Preliminary result)   Collection Time   02/05/12  7:45 AM      Component Value Range Status Comment   Specimen Description ABSCESS BILE FROM [facility]  Final    Report Status PENDING   Incomplete      Studies: Ir Perc Cholecystostomy  02/05/2012  *RADIOLOGY REPORT*  IR PERCUTANEOUS CHOLECYSTOSTOMY TUBE PLACEMENT  Date: 02/05/2012  Clinical History: 78 year old female with a history of biliary colic and current admission for acute on chronic  cholecystitis diagnosed by nuclear medicine HIDA scan. She has a marginal surgical candidate prefers to avoid surgery at this time if possible.  She presents for placement of a percutaneous cholecystostomy tube.  Procedures Performed: 1. Ultrasound-guided puncture of the gallbladder transhepatic fashion 2.  Placement of a 10-French cholecystostomy tube under fluoroscopic guidance 3.  Limited cholangiogram  Interventional Radiologist:  Sterling Big, MD  Sedation: Moderate (conscious) sedation was used.  One mg Versed, 50 mcg Fentanyl were administered intravenously.  The patient's vital signs were monitored continuously by radiology nursing throughout the procedure.  Sedation Time: 20 minutes  Fluoroscopy time: 1.3 minutes  Contrast volume: 15 ml Omnipaque-300 administered into the gallbladder lumen  PROCEDURE/FINDINGS:   Informed consent was obtained from the patient following explanation of the procedure, risks, benefits and alternatives. The patient understands, agrees and consents for the procedure. All questions were addressed. A time out was performed.  Maximal barrier sterile technique utilized including caps, mask, sterile gowns, sterile gloves, large sterile drape, hand hygiene, and betadine skin prep.  The right hemiabdomen was interrogated with ultrasound.  As seen on prior CT, she has a relatively low- lying liver and in inferiorly-directed gallbladder which comes all the way down to the iliac crest.  A suitable window passing through a small amount of peripheral liver into the gallbladder lumen was selected and marked.  Local anesthesia was achieved by infiltration of 1% lidocaine.  Under direct ultrasound guidance, a 21 gauge Accustick needle was advanced through the body wall, hepatic parenchyma and into the gallbladder lumen.  An image was saved and stored for the medical record.  A micro wire was coiled in the gallbladder lumen and the Accustick sheath advanced over the wire. Correct placement  was confirmed by immediate return of purulent biliary fluid.  An Amplatz wire was then advanced into the gallbladder lumen and the tract serially dilated to 10-French. After this, a Cook all-purpose drainage catheter was advanced over the wire and positioned within the gallbladder lumen.  The locking pigtail loop was formed.  Aspiration of 20 ml of purulent brown biliary fluid containing debris was successfully aspirated.  A gentle hand injection of contrast material confirms tube placement within the gallbladder as well as a large filling defect in the gallbladder neck consistent with an impacted stone.  The drain was secured to the skin with Prolene suture, bumper and an adhesive fixation device.  There is no immediate complication, the patient tolerated the procedure well.  The tube was connected to gravity bag drainage.  IMPRESSION:  1.  Successful placement of a percutaneous transhepatic 10-French cholecystostomy tube with return of purulent appearing in debris filled bowel.  A sample was sent to the laboratory for culture.  Drain management:  The patient and her son are aware that given her calculus acute on chronic cholecystitis she may require long-term care and maintenance of her new cholecystostomy tube until she is both fit and willing to undergo interval cholecystectomy.  She will return interventional radiology in 4 - 6 weeks for her first tube check and change. After that, she will likely require routine tube check and changes every 6 - 8 weeks.  While it is possible to remove the tube after her acute episode has resolved.  Patients with calculus cholecystitis have relatively high rate of recurrent cholecystitis.  Signed,  Sterling Big, MD Vascular & Interventional Radiologist [facility] Radiology  Original Report Authenticated By: Malachy Moan, M.D.     Scheduled Meds:   . amitriptyline  50 mg Oral QHS  . ampicillin-sulbactam  1.5 g Intravenous Q6H  . atorvastatin  20 mg Oral q1800    . calcium-vitamin D  1 tablet Oral Daily  . heparin  5,000 Units Subcutaneous Q8H  . metoprolol succinate  100 mg Oral Daily  . pantoprazole  40 mg Oral Daily  . predniSONE  5 mg Oral Daily   Continuous Infusions:   . 0.9 % NaCl with KCl 40 mEq / L 75 mL/hr at 02/06/12 0500

## 2012-02-06 NOTE — Progress Notes (Signed)
Subjective: Pt doing well; no sig c/o at present;  son in room  Objective: Vital signs in last 24 hours: Temp:  [97.8 F (36.6 C)-99.4 F (37.4 C)] 97.8 F (36.6 C) (12/29 0551) Pulse Rate:  [94-121] 121  (12/29 1007) Resp:  [16-20] 16  (12/29 0551) BP: (131-138)/(48-66) 138/59 mmHg (12/29 1007) SpO2:  [95 %-97 %] 97 % (12/29 0551) Last BM Date: 02/06/12  Intake/Output from previous day: 12/28 0701 - 12/29 0700 In: 2048.8 [P.O.:600; I.V.:1298.8; IV Piggyback:150] Out: 2250 [Urine:1900; Drains:350] Intake/Output this shift: Total I/O In: 480 [P.O.:480] Out: 850 [Urine:800; Drains:50]  GB drain intact, output 50 cc's today; insertion site ok, NT; bile cx's pending  Lab Results:   Basename 02/06/12 0525 02/05/12 0501  WBC 6.4 9.8  HGB 9.5* 9.1*  HCT 29.6* 27.9*  PLT 162 120*   BMET  Basename 02/06/12 0525 02/05/12 0501  NA 139 136  K 4.3 2.9*  CL 108 108  CO2 21 22  GLUCOSE 90 105*  BUN 7 7  CREATININE 0.43* 0.44*  CALCIUM 8.5 8.0*   PT/INR No results found for this basename: LABPROT:2,INR:2 in the last 72 hours ABG No results found for this basename: PHART:2,PCO2:2,PO2:2,HCO3:2 in the last 72 hours Results for orders placed during the hospital encounter of 02/02/12  CULTURE, BLOOD (ROUTINE X 2)     Status: Normal   Collection Time   02/02/12  9:16 PM      Component Value Range Status Comment   Specimen Description BLOOD RIGHT FOREARM   Final    Special Requests BOTTLES DRAWN AEROBIC AND ANAEROBIC 3CC   Final    Culture  Setup Time 02/03/2012 00:26   Final    Culture     Final    Value: ESCHERICHIA COLI     Note: Gram Stain Report Called to,Read Back By and Verified With: COURTNEY K @ 0816 02/04/12 BY KRAWS   Report Status 02/05/2012 FINAL   Final    Organism ID, Bacteria ESCHERICHIA COLI   Final   CULTURE, BLOOD (ROUTINE X 2)     Status: Normal (Preliminary result)   Collection Time   02/02/12 10:03 PM      Component Value Range Status Comment   Specimen Description BLOOD RIGHT ARM   Final    Special Requests BOTTLES DRAWN AEROBIC ONLY    Final    Culture  Setup Time 02/03/2012 00:26   Final    Culture     Final    Value:        BLOOD CULTURE RECEIVED NO GROWTH TO DATE CULTURE WILL BE HELD FOR 5 DAYS BEFORE ISSUING A FINAL NEGATIVE REPORT   Report Status PENDING   Incomplete   MRSA PCR SCREENING     Status: Normal   Collection Time   02/02/12 11:03 PM      Component Value Range Status Comment   MRSA by PCR NEGATIVE  NEGATIVE Final   BODY FLUID CULTURE     Status: Normal (Preliminary result)   Collection Time   02/05/12  7:45 AM      Component Value Range Status Comment   Specimen Description ABSCESS BILE FROM GALLBLADDER   Final    Special Requests NONE   Final    Gram Stain     Final    Value: NO WBC SEEN     NO SQUAMOUS EPITHELIAL CELLS SEEN     NO ORGANISMS SEEN   Culture PENDING   Incomplete  Report Status PENDING   Incomplete     Studies/Results: Ir Perc Cholecystostomy  02/05/2012  *RADIOLOGY REPORT*  IR PERCUTANEOUS CHOLECYSTOSTOMY TUBE PLACEMENT  Date: 02/05/2012  Clinical History: 78 year old female with a history of biliary colic and current admission for acute on chronic cholecystitis diagnosed by nuclear medicine HIDA scan. She has a marginal surgical candidate prefers to avoid surgery at this time if possible.  She presents for placement of a percutaneous cholecystostomy tube.  Procedures Performed: 1. Ultrasound-guided puncture of the gallbladder transhepatic fashion 2.  Placement of a 10-French cholecystostomy tube under fluoroscopic guidance 3.  Limited cholangiogram  Interventional Radiologist:  Sterling Big, MD  Sedation: Moderate (conscious) sedation was used.  One mg Versed, 50 mcg Fentanyl were administered intravenously.  The patient's vital signs were monitored continuously by radiology nursing throughout the procedure.  Sedation Time: 20 minutes  Fluoroscopy time: 1.3 minutes  Contrast volume:  15 ml Omnipaque-300 administered into the gallbladder lumen  PROCEDURE/FINDINGS:   Informed consent was obtained from the patient following explanation of the procedure, risks, benefits and alternatives. The patient understands, agrees and consents for the procedure. All questions were addressed. A time out was performed.  Maximal barrier sterile technique utilized including caps, mask, sterile gowns, sterile gloves, large sterile drape, hand hygiene, and betadine skin prep.  The right hemiabdomen was interrogated with ultrasound.  As seen on prior CT, she has a relatively low- lying liver and in inferiorly-directed gallbladder which comes all the way down to the iliac crest.  A suitable window passing through a small amount of peripheral liver into the gallbladder lumen was selected and marked.  Local anesthesia was achieved by infiltration of 1% lidocaine.  Under direct ultrasound guidance, a 21 gauge Accustick needle was advanced through the body wall, hepatic parenchyma and into the gallbladder lumen.  An image was saved and stored for the medical record.  A micro wire was coiled in the gallbladder lumen and the Accustick sheath advanced over the wire. Correct placement was confirmed by immediate return of purulent biliary fluid.  An Amplatz wire was then advanced into the gallbladder lumen and the tract serially dilated to 10-French. After this, a Cook all-purpose drainage catheter was advanced over the wire and positioned within the gallbladder lumen.  The locking pigtail loop was formed.  Aspiration of 20 ml of purulent brown biliary fluid containing debris was successfully aspirated.  A gentle hand injection of contrast material confirms tube placement within the gallbladder as well as a large filling defect in the gallbladder neck consistent with an impacted stone.  The drain was secured to the skin with Prolene suture, bumper and an adhesive fixation device.  There is no immediate complication, the patient  tolerated the procedure well.  The tube was connected to gravity bag drainage.  IMPRESSION:  1.  Successful placement of a percutaneous transhepatic 10-French cholecystostomy tube with return of purulent appearing in debris filled bowel.  A sample was sent to the laboratory for culture.  Drain management:  The patient and her son are aware that given her calculus acute on chronic cholecystitis she may require long-term care and maintenance of her new cholecystostomy tube until she is both fit and willing to undergo interval cholecystectomy.  She will return interventional radiology in 4 - 6 weeks for her first tube check and change. After that, she will likely require routine tube check and changes every 6 - 8 weeks.  While it is possible to remove the tube after her  acute episode has resolved.  Patients with calculus cholecystitis have relatively high rate of recurrent cholecystitis.  Signed,  Sterling Big, MD Vascular & Interventional Radiologist [facility] Radiology  Original Report Authenticated By: Malachy Moan, M.D.     Anti-infectives: Anti-infectives     Start     Dose/Rate Route Frequency Ordered Stop   02/03/12 0600   ampicillin-sulbactam (UNASYN) 1.5 g in sodium chloride 0.9 % 50 mL IVPB        1.5 g 100 mL/hr over 30 Minutes Intravenous Every 6 hours 02/02/12 2306     02/02/12 2100   Ampicillin-Sulbactam (UNASYN) 3 g in sodium chloride 0.9 % 100 mL IVPB        3 g 100 mL/hr over 60 Minutes Intravenous  Once 02/02/12 2055 02/02/12 2310          Assessment/Plan: s/p perc cholecystostomy 12/28; drain will need to remain in place at least 4-6 weeks unless cholecystectomy done in interim; monitor labs; check final bile cx's; cont drain flushes- rec once daily with 5 cc's sterile NS as OP along with output recording and dressing changes every 1-2 days. Other plans as per [facility].  LOS: 4 days    Little Winton,D [facility] 02/06/2012

## 2012-02-07 DIAGNOSIS — E785 Hyperlipidemia, unspecified: Secondary | ICD-10-CM

## 2012-02-07 DIAGNOSIS — E876 Hypokalemia: Secondary | ICD-10-CM

## 2012-02-07 LAB — COMPREHENSIVE METABOLIC PANEL
AST: 16 U/L (ref 0–37)
BUN: 8 mg/dL (ref 6–23)
CO2: 21 mEq/L (ref 19–32)
Chloride: 109 mEq/L (ref 96–112)
Creatinine, Ser: 0.44 mg/dL — ABNORMAL LOW (ref 0.50–1.10)
GFR calc Af Amer: >90 mL/min (ref >90)
GFR calc non Af Amer: >90 mL/min (ref >90)
Glucose, Bld: 98 mg/dL (ref 70–99)
Total Bilirubin: 0.3 mg/dL (ref 0.3–1.2)

## 2012-02-07 LAB — BODY FLUID CULTURE

## 2012-02-07 NOTE — Progress Notes (Signed)
I agree with above.  OK for home when ok from medical standpoint and f/u in 1-2 weels with Dr. Ezzard Standing.

## 2012-02-07 NOTE — Progress Notes (Signed)
[facility] PROGRESS NOTE  [name]:[id number] DOB: 12-14-1925 DOA: 02/02/2012 PCP: Tula Nakayama, MD  Brief narrative: 78 year-old female with a past medical history of coronary artery disease, aortic sclerosis, MR, CHF, RA on chronic steroids and hypertension who was admitted to the hospital on 02/02/2012 with acute cholecystitis. Cardiology consulted for cardiac clearance prior to possible surgery. Treatment team ultimately decided to perform a percutaneous cholecystostomy for drainage of the gallbladder rather than surgical intervention.  Patient now has a percutaneous drain in place and will be going home with instructions to follow up with surgery outpatient.  Assessment/Plan:   Principal Problem:  *Acute cholecystitis / E. Coli bacteremia  CT scan did not show any evidence of cholecystitis but did show a large stone in the gallbladder and minimal gallbladder wall thickening. Additionally, the common bile duct was dilated. Abdominal ultrasound on 12/25/2013showed cholelithiasis without evidence of cholecystitis, and a negative sonographic Murphy's sign. HIDA scan subsequently done which did show cystic duct obstruction and acute cholecystitis.  Blood cultures x 1 set growing E.Coli pansensitive; follow up blood cultures show no growth to date; Called ID to give recommendation for length of antibiotics considering bacteremia and UTI with positive E.Coli; awaiting response Status post percutaneous drainage 02/05/2012  Continue empiric Unasyn for now  Active Problems:  Normocytic anemia  Likely anemia of chronic disease secondary to RA  Hemoglobin stable and no signs of active bleed Hypokalemia  Repleted in IV fluids. Mitral regurgitation / Aortic valve sclerosis  stable. Lung disease, interstitial  No complaints of shortness of breath. CHF (congestive heart failure)  Compensated  CAD (coronary artery disease)  Continue statin and beta blocker.  2 D Echo  done 02/04/12. EF 65% with high ventricular filling pressures.  Rheumatoid arthritis on chronic steroids  Placed on stress dose steroids upon admission. Arava on hold.  No evidence of adrenal crisis. Blood pressure stable. Discontinued stress dose steroids 02/04/12 and resumed home dose of prednisone. Hiatal hernia  Continue Protonix. Abdominal lymphadenopathy  Findings on CT scan worrisome for malignancy. CEA was within normal limits, CA 125 only mildly elevated. Further work up once patient stable.   Code Status: Full  Family Communication: family at bedside  Disposition Plan: Home in am; HHPT and RN order in place; RN needed for dressing changes every 1-2 days along with daily 5 cc NS flushes and output recordings   Medical Consultants:  Dr. Glenna Fellows, surgery  Dr. Shawnie Pons, Cardiology  Dr. Malachy Moan, IR Other Consultants:  None. Anti-infectives:  Unasyn 02/02/2012--->   Manson Passey, MD  [facility]  Pager [phone]   If 7PM-7AM, please contact night-coverage [email or web address] Password [email or web address] 02/07/2012, 11:28 AM   LOS: 5 days   HPI/Subjective: No acute overnight events.  Objective: Filed Vitals:   02/06/12 1411 02/06/12 2115 02/07/12 0555 02/07/12 1004  BP: 130/68 131/60 148/87 120/49  Pulse: 111 67 94 111  Temp: 98 F (36.7 C) 98.7 F (37.1 C) 98 F (36.7 C)   TempSrc: Oral Oral Oral   Resp: 22 16 16    Height:      Weight:      SpO2: 97% 95% 98%     Intake/Output Summary (Last 24 hours) at 02/07/12 1128 Last data filed at 02/07/12 1000  Gross per 24 hour  Intake   1205 ml  Output   1876 ml  Net   -671 ml    Exam:   General:  Pt is alert, follows commands appropriately, not in acute distress  Cardiovascular: Regular rate and rhythm, S1/S2, no murmurs, no rubs, no gallops  Respiratory: Clear to auscultation bilaterally, no wheezing, no crackles, no rhonchi  Abdomen: Soft, non tender, non distended, bowel sounds present, no guarding;  perc drain in place with no stigmata of infection  Extremities: No edema, pulses DP and PT palpable bilaterally  Neuro: Grossly nonfocal  Data Reviewed: Basic Metabolic Panel:  Lab 02/07/12 4540 02/06/12 0525 02/05/12 0501 02/02/12 1630  NA 136 139 136 135  K 4.2 4.3 2.9* 3.9  CL 109 108 108 98  CO2 21 21 22 25   GLUCOSE 98 90 105* 101*  BUN 8 7 7 13   CREATININE 0.44* 0.43* 0.44* 0.50  CALCIUM 8.7 8.5 8.0* 9.3  MG -- -- -- 2.0  PHOS -- -- -- --   Liver Function Tests:  Lab 02/07/12 0434 02/03/12 0346 02/02/12 1630  AST 16 26 41*  ALT 6 9 11   ALKPHOS 107 69 83  BILITOT 0.3 0.4 0.5  PROT 5.6* 5.4* 7.0  ALBUMIN 2.2* 2.7* 3.6    Lab 02/02/12 1630  LIPASE 36  AMYLASE --   No results found for this basename: AMMONIA:5 in the last 168 hours CBC:  Lab 02/06/12 0525 02/05/12 0501 02/02/12 1630 02/02/12 1516  WBC 6.4 9.8 11.6* 12.8*  NEUTROABS -- -- 9.2* --  HGB 9.5* 9.1* 11.9* 12.5  HCT 29.6* 27.9* 36.9 41.7  MCV 96.1 97.2 97.6 100.4*  PLT 162 120* 185 --   Cardiac Enzymes:  Lab 02/02/12 2015 02/02/12 1630  CKTOTAL -- --  CKMB -- --  CKMBINDEX -- --  TROPONINI <0.30 <0.30    Recent Results (from the past 240 hour(s))  URINE CULTURE     Status: Normal   Collection Time   02/02/12  3:20 PM      Component Value Range Status Comment   Culture ESCHERICHIA COLI   Final    Colony Count >=100,000 COLONIES/ML   Final    Organism ID, Bacteria ESCHERICHIA COLI   Final   CULTURE, BLOOD (ROUTINE X 2)     Status: Normal   Collection Time   02/02/12  9:16 PM      Component Value Range Status Comment   Specimen Description BLOOD RIGHT FOREARM   Final    Special Requests BOTTLES DRAWN AEROBIC AND ANAEROBIC 3CC   Final    Culture  Setup Time 02/03/2012 00:26   Final    Culture     Final    Value: ESCHERICHIA COLI     Note: Gram Stain Report Called to,Read Back By and Verified With: COURTNEY K @ 0816 02/04/12 BY KRAWS   Report Status 02/05/2012 FINAL   Final    Organism  ID, Bacteria ESCHERICHIA COLI   Final   CULTURE, BLOOD (ROUTINE X 2)     Status: Normal (Preliminary result)   Collection Time   02/02/12 10:03 PM      Component Value Range Status Comment   Specimen Description BLOOD RIGHT ARM   Final    Special Requests BOTTLES DRAWN AEROBIC ONLY    Final    Culture  Setup Time 02/03/2012 00:26   Final    Culture     Final    Value:        BLOOD CULTURE RECEIVED NO GROWTH TO DATE CULTURE WILL BE HELD FOR 5 DAYS BEFORE ISSUING A FINAL NEGATIVE REPORT   Report Status PENDING   Incomplete   MRSA PCR SCREENING  Status: Normal   Collection Time   02/02/12 11:03 PM      Component Value Range Status Comment   MRSA by PCR NEGATIVE  NEGATIVE Final   BODY FLUID CULTURE     Status: Normal   Collection Time   02/05/12  7:45 AM      Component Value Range Status Comment   Specimen Description ABSCESS BILE FROM GALLBLADDER   Final    Special Requests NONE   Final    Gram Stain     Final    Value: NO WBC SEEN     NO SQUAMOUS EPITHELIAL CELLS SEEN     NO ORGANISMS SEEN   Culture RARE ESCHERICHIA COLI   Final    Report Status 02/07/2012 FINAL   Final    Organism ID, Bacteria ESCHERICHIA COLI   Final   CULTURE, BLOOD (ROUTINE X 2)     Status: Normal (Preliminary result)   Collection Time   02/06/12  2:50 PM      Component Value Range Status Comment   Specimen Description BLOOD LEFT HAND   Final    Special Requests BOTTLES DRAWN AEROBIC AND ANAEROBIC 5CC EACH   Final    Culture  Setup Time 02/06/2012 18:45   Final    Culture     Final    Value:        BLOOD CULTURE RECEIVED NO GROWTH TO DATE CULTURE WILL BE HELD FOR 5 DAYS BEFORE ISSUING A FINAL NEGATIVE REPORT   Report Status PENDING   Incomplete   CULTURE, BLOOD (ROUTINE X 2)     Status: Normal (Preliminary result)   Collection Time   02/06/12  3:00 PM      Component Value Range Status Comment   Specimen Description BLOOD RIGHT ARM   Final    Special Requests BOTTLES DRAWN AEROBIC AND ANAEROBIC 3CC  EACH   Final    Culture  Setup Time 02/06/2012 18:45   Final    Culture     Final    Value:        BLOOD CULTURE RECEIVED NO GROWTH TO DATE CULTURE WILL BE HELD FOR 5 DAYS BEFORE ISSUING A FINAL NEGATIVE REPORT   Report Status PENDING   Incomplete      Studies: No results found.  Scheduled Meds:   . amitriptyline  50 mg Oral QHS  . ampicillin-sulbactam (UNASYN) IV  1.5 g Intravenous Q6H  . atorvastatin  20 mg Oral q1800  . calcium-vitamin D  1 tablet Oral Daily  . heparin  5,000 Units Subcutaneous Q8H  . metoprolol succinate  100 mg Oral Daily  . pantoprazole  40 mg Oral Daily  . predniSONE  5 mg Oral Daily  . sodium chloride  3 mL Intravenous Q12H   Continuous Infusions:   . 0.9 % NaCl with KCl 40 mEq / L 75 mL/hr at 02/07/12 0520

## 2012-02-07 NOTE — Progress Notes (Signed)
Patient ID: [name], female   DOB: May 19, 1925, 77 y.o.   MRN: [id number]    Subjective: Pt feels better.  Tolerating a solid diet.  Pain well controlled  Objective: Vital signs in last 24 hours: Temp:  [98 F (36.7 C)-98.7 F (37.1 C)] 98 F (36.7 C) (12/30 0555) Pulse Rate:  [67-111] 111  (12/30 1004) Resp:  [16-22] 16  (12/30 0555) BP: (120-148)/(49-87) 120/49 mmHg (12/30 1004) SpO2:  [95 %-98 %] 98 % (12/30 0555) Last BM Date: 02/07/12  Intake/Output from previous day: 12/29 0701 - 12/30 0700 In: 1445 [P.O.:480; I.V.:865; IV Piggyback:100] Out: 2476 [Urine:2250; Drains:225; Stool:1] Intake/Output this shift: Total I/O In: 240 [P.O.:240] Out: 250 [Urine:250]  PE: Abd: soft, appropriately tender around drain, drain with bilious output, +BS, ND  Lab Results:   [facility] 02/06/12 0525 02/05/12 0501  WBC 6.4 9.8  HGB 9.5* 9.1*  HCT 29.6* 27.9*  PLT 162 120*   BMET  Basename 02/07/12 0434 02/06/12 0525  NA 136 139  K 4.2 4.3  CL 109 108  CO2 21 21  GLUCOSE 98 90  BUN 8 7  CREATININE 0.44* 0.43*  CALCIUM 8.7 8.5   PT/INR No results found for this basename: LABPROT:2,INR:2 in the last 72 hours CMP     Component Value Date/Time   NA 136 02/07/2012 0434   K 4.2 02/07/2012 0434   CL 109 02/07/2012 0434   CO2 21 02/07/2012 0434   GLUCOSE 98 02/07/2012 0434   BUN 8 02/07/2012 0434   CREATININE 0.44* 02/07/2012 0434   CALCIUM 8.7 02/07/2012 0434   PROT 5.6* 02/07/2012 0434   ALBUMIN 2.2* 02/07/2012 0434   AST 16 02/07/2012 0434   ALT 6 02/07/2012 0434   ALKPHOS 107 02/07/2012 0434   BILITOT 0.3 02/07/2012 0434   GFRNONAA >90 02/07/2012 0434   GFRAA >90 02/07/2012 0434   Lipase     Component Value Date/Time   LIPASE 36 02/02/2012 1630       Studies/Results: No results found.  Anti-infectives: Anti-infectives     Start     Dose/Rate Route Frequency Ordered Stop   02/03/12 0600   ampicillin-sulbactam (UNASYN) 1.5 g in sodium chloride 0.9 %  50 mL IVPB        1.5 g 100 mL/hr over 30 Minutes Intravenous Every 6 hours 02/02/12 2306     02/02/12 2100   Ampicillin-Sulbactam (UNASYN) 3 g in sodium chloride 0.9 % 100 mL IVPB        3 g 100 mL/hr over 60 Minutes Intravenous  Once 02/02/12 2055 02/02/12 2310           Assessment/Plan  1. Acute cholecystitis, s/p perc drain  Plan: 1. Patient is surgically stable.  She is ok for dc home from our standpoint.  She should follow up with Dr. Ezzard Standing in 4-6 weeks.   LOS: 5 days    Tailyn Hantz E 02/07/2012, 10:41 AM Pager: [phone]

## 2012-02-07 NOTE — Progress Notes (Signed)
Subjective: Pt feeling better. On reg diet  Objective: Physical Exam: BP 120/49  Pulse 111  Temp 98 F (36.7 C) (Oral)  Resp 16  Ht 5\' 5"  (1.651 m)  Wt 107 lb 5.8 oz (48.7 kg)  BMI 17.87 kg/m2  SpO2 98% Drain intact, site clean. Output bilious, 225cc yesterday, 50cc so far today.  Labs: CBC  Basename 02/06/12 0525 02/05/12 0501  WBC 6.4 9.8  HGB 9.5* 9.1*  HCT 29.6* 27.9*  PLT 162 120*   BMET  Basename 02/07/12 0434 02/06/12 0525  NA 136 139  K 4.2 4.3  CL 109 108  CO2 21 21  GLUCOSE 98 90  BUN 8 7  CREATININE 0.44* 0.43*  CALCIUM 8.7 8.5   LFT  Basename 02/07/12 0434  PROT 5.6*  ALBUMIN 2.2*  AST 16  ALT 6  ALKPHOS 107  BILITOT 0.3  BILIDIR --  IBILI --  LIPASE --   PT/INR No results found for this basename: LABPROT:2,INR:2 in the last 72 hours   Studies/Results: No results found.  Assessment/Plan: s/p perc cholecystostomy 12/28 Appears plan is for discharge tomorrow and follow up with [facility]. Agree with plans for once daily flush with 5cc NS Call if needed.   LOS: 5 days    Brayton El PA-C 02/07/2012 1:16 PM

## 2012-02-07 NOTE — Care Management Note (Signed)
    Page 1 of 2   02/08/2012     2:02:40 PM   CARE MANAGEMENT NOTE 02/08/2012  Patient:  [name], [name]   Account Number:  [id number]  Date Initiated:  02/07/2012  Documentation initiated by:  [facility]  Subjective/Objective Assessment:   ADMITTED W/ABD PAIN.ACUTE CHOLECYSTITIS.     Action/Plan:   FROM HOME W/SON.HAS PCP,PHARMACY,RW.   Anticipated DC Date:  02/08/2012   Anticipated DC Plan:  HOME W HOME HEALTH SERVICES      DC Planning Services  CM consult      Choice offered to / List presented to:  C-1 Patient        HH arranged  HH-1 RN  IV Antibiotics  HH-2 PT  HH-3 OT      Status of service:  Completed, signed off Medicare Important Message given?   (If response is "NO", the following Medicare IM given date fields will be blank) Date Medicare IM given:   Date Additional Medicare IM given:    Discharge Disposition:  HOME W HOME HEALTH SERVICES  Per UR Regulation:  Reviewed for med. necessity/level of care/duration of stay  If discussed at Long Length of Stay Meetings, dates discussed:   02/08/2012    Comments:  02/08/12 Horton Ellithorpe RN,BSN NCM [phone] [facility] CHOSEN FOR HH,CONTACTED SUSAN(LIASON) INFORMED OF D/C,& HHRN-IV ABX,PICC LINE FLUSH PER PROTOCAL,HHPT/OT. 02/07/12 Benaiah Behan RN,BSN NCM [phone] PT-HH,RW.PROVIDED PATIENT W/HHC AGENCY LIST,& EXPLAINED SERVICES, & RECOMMENDATIONS FOR HH.PATIENT DECLINES HHC,STATES SHE HAS SON,DAUGHTER FROM OUT OF TOWN TO ASSIST @ HOME.

## 2012-02-07 NOTE — Evaluation (Signed)
Physical Therapy Evaluation Patient Details Name: [name] MRN: [id number] DOB: 1925-09-02 Today's Date: 02/07/2012 Time: 8295-6213 PT Time Calculation (min): 20 min  PT Assessment / Plan / Recommendation Clinical Impression  Pt admitted for acute cholecystitis and s/p percutaneous drainage 02/05/2012.  Pt would benefit from acute PT services in order to improve independence with mobility and overall strength to prepare for d/c.  Pt reports LE weakness and not ambulating since admission.  Pt reported slight SOB upon return to room after ambulating with SaO2 98% room air and HR 119 bpm.    PT Assessment  Patient needs continued PT services    Follow Up Recommendations  Home health PT;Supervision for mobility/OOB    Does the patient have the potential to tolerate intense rehabilitation      Barriers to Discharge        Equipment Recommendations  Rolling walker with 5" wheels (depending on walker at home)    Recommendations for Other Services     Frequency Min 3X/week    Precautions / Restrictions Precautions Precautions: Fall Precaution Comments: R abdominal drain Restrictions Weight Bearing Restrictions: No   Pertinent Vitals/Pain Denies pain at rest     Mobility  Bed Mobility Bed Mobility: Supine to Sit Supine to Sit: HOB elevated;5: Supervision Transfers Transfers: Stand to Sit;Sit to Stand Sit to Stand: 4: Min assist;From bed Stand to Sit: 4: Min guard;To chair/3-in-1 Details for Transfer Assistance: assist to steady upon rise due to posterior LOB Ambulation/Gait Ambulation/Gait Assistance: 4: Min assist Ambulation Distance (Feet): 60 Feet Assistive device: Rolling walker Ambulation/Gait Assistance Details: pt ambulated into hallway with IV pole however unsteady with min assist so given RW to use and min/guard level Gait Pattern: Step-through pattern;Trunk flexed Gait velocity: decreased    Shoulder Instructions     Exercises General Exercises -  Lower Extremity Ankle Circles/Pumps: AROM;Both;15 reps Long Arc Quad: AROM;Both;10 reps;Seated Hip ABduction/ADduction: AROM;Both;15 reps;Supine Hip Flexion/Marching: AROM;Both;15 reps;Seated   PT Diagnosis: Generalized weakness;Difficulty walking  PT Problem List: Decreased strength;Decreased activity tolerance;Decreased mobility;Decreased balance;Decreased knowledge of use of DME PT Treatment Interventions: DME instruction;Gait training;Functional mobility training;Therapeutic activities;Therapeutic exercise;Patient/family education   PT Goals Acute Rehab PT Goals PT Goal Formulation: With patient Time For Goal Achievement: 02/14/12 Potential to Achieve Goals: Good Pt will go Supine/Side to Sit: with modified independence PT Goal: Supine/Side to Sit - Progress: Goal set today Pt will go Sit to Stand: with supervision PT Goal: Sit to Stand - Progress: Goal set today Pt will go Stand to Sit: with supervision PT Goal: Stand to Sit - Progress: Goal set today Pt will Ambulate: 51 - 150 feet;with supervision;with least restrictive assistive device PT Goal: Ambulate - Progress: Goal set today Pt will Perform Home Exercise Program: with supervision, verbal cues required/provided PT Goal: Perform Home Exercise Program - Progress: Goal set today  Visit Information  Last PT Received On: 02/07/12 Assistance Needed: +1    Subjective Data  Subjective: I'm real weak.   Prior Functioning  Home Living Lives With: Son Available Help at Discharge: Family Type of Home: House Home Access: Stairs to enter Secretary/administrator of Steps: 2-3 Entrance Stairs-Rails: None Home Layout: Able to live on main level with bedroom/bathroom Home Adaptive Equipment: Bedside commode/3-in-1 Additional Comments: Pt states she has walker but doesn't know what it looks like. Prior Function Level of Independence: Independent Communication Communication: No difficulties    Cognition  Overall Cognitive  Status: Appears within functional limits for tasks assessed/performed Arousal/Alertness: Awake/alert Orientation Level: Appears  intact for tasks assessed Behavior During Session: [facility] for tasks performed    Extremity/Trunk Assessment Right Upper Extremity Assessment RUE ROM/Strength/Tone: [facility] for tasks assessed Left Upper Extremity Assessment LUE ROM/Strength/Tone: [facility] for tasks assessed Right Lower Extremity Assessment RLE ROM/Strength/Tone: [facility] for tasks assessed Left Lower Extremity Assessment LLE ROM/Strength/Tone: [facility] for tasks assessed   Balance    End of Session PT - End of Session Activity Tolerance: Patient limited by fatigue Patient left: in chair;with call bell/phone within reach Nurse Communication: Other (comment) (pt up in chair)  GP     Korbin Mapps,KATHrine E 02/07/2012, 10:04 AM Pager: [phone]

## 2012-02-08 MED ORDER — SODIUM CHLORIDE 0.9 % IJ SOLN
10.0000 mL | INTRAMUSCULAR | Status: DC | PRN
  Administered 2012-02-08: 10 mL

## 2012-02-08 MED ORDER — AMITRIPTYLINE HCL 50 MG PO TABS: 50.0000 mg | ORAL_TABLET | Freq: Every day | ORAL | Status: DC

## 2012-02-08 MED ORDER — HEPARIN SOD (PORK) LOCK FLUSH 100 UNIT/ML IV SOLN
250.0000 [IU] | INTRAVENOUS | Status: AC | PRN
  Administered 2012-02-08: 250 [IU]

## 2012-02-08 MED ORDER — DEXTROSE 5 % IV SOLN: 2.0000 g | INTRAVENOUS | Status: DC

## 2012-02-08 NOTE — Progress Notes (Signed)
PT Cancellation Note  Patient Details Name: [name] MRN: [id number] DOB: September 23, 1925   Cancelled Treatment:    Reason Eval/Treat Not Completed: Other (comment) (pt declined to save energy for possible d/c home today)   Demetric Parslow,KATHrine E 02/08/2012, 9:37 AM Pager: [phone]

## 2012-02-08 NOTE — Discharge Summary (Signed)
Physician Discharge Summary  [name] WUJ:[id number] DOB: [birth year] DOA: 02/02/2012  PCP: Tula Nakayama, MD  Admit date: 02/02/2012 Discharge date: 02/08/2012  Recommendations for Outpatient Follow-up:  1. Patient has been instructed to follow up with surgery per scheduled appointment  Discharge Diagnoses:  Principal Problem:  *Acute cholecystitis Active Problems:  Mitral regurgitation  Lung disease, interstitial  Aortic valve sclerosis  CHF (congestive heart failure)  CAD (coronary artery disease)  Rheumatoid arthritis on chronic steroids  Hiatal hernia  Lymphadenopathy, abdominal  Cholelithiasis  Hypokalemia  Normocytic anemia  Bacteremia due to Escherichia coli  Discharge Condition: medically stable for discharge home today; HHPT and RN in place; patient instructed on dressing changes every other day and daily output recordings and flushes  Diet recommendation: as tolerated  History of present illness:  78 year-old female with a past medical history of coronary artery disease, aortic sclerosis, MR, CHF, RA on chronic steroids and hypertension who was admitted to the hospital on 02/02/2012 with acute cholecystitis. Cardiology consulted for cardiac clearance prior to possible surgery. Treatment team ultimately decided to perform a percutaneous cholecystostomy for drainage of the gallbladder rather than surgical intervention. Patient now has a percutaneous drain in place and will be going home with instructions to follow up with surgery outpatient.   Assessment/Plan:   Principal Problem:  *Acute cholecystitis / E. Coli bacteremia  CT scan did not show any evidence of cholecystitis but did show a large stone in the gallbladder and minimal gallbladder wall thickening. Additionally, the common bile duct was dilated. Abdominal ultrasound on 12/25/2013showed cholelithiasis without evidence of cholecystitis, and a negative sonographic Murphy's sign. HIDA scan  subsequently done which did show cystic duct obstruction and acute cholecystitis.  Blood cultures x 1 set growing E.Coli pansensitive; follow up blood cultures show no growth to date; Called ID to give recommendation - ceftriaxone 2 gm daily for next 2 weeks; PICC line placed Status post percutaneous drainage 02/05/2012    Active Problems:  Normocytic anemia  Likely anemia of chronic disease secondary to RA  Hemoglobin stable and no signs of active bleed Hypokalemia  Repleted in IV fluids. Mitral regurgitation / Aortic valve sclerosis  stable. Lung disease, interstitial  No complaints of shortness of breath. CHF (congestive heart failure)  Compensated  CAD (coronary artery disease)  Continue statin and beta blocker.  2 D Echo done 02/04/12. EF 65% with high ventricular filling pressures.  Rheumatoid arthritis on chronic steroids  Placed on stress dose steroids upon admission. Arava on hold.  No evidence of adrenal crisis. Blood pressure remains stable. Discontinued stress dose steroids 02/04/12 and resumed home dose of prednisone. Hiatal hernia  Continue Protonix. Abdominal lymphadenopathy  Findings on CT scan worrisome for malignancy. CEA was within normal limits, CA 125 only mildly elevated. Further work up once patient stable.   Code Status: Full  Family Communication: family at bedside  Disposition Plan: Home today; HHPT and RN order in place; RN needed for dressing changes every 1-2 days along with daily 5 cc NS flushes and output recordings    Medical Consultants:  Dr. Glenna Fellows, surgery  Dr. Shawnie Pons, Cardiology  Dr. Malachy Moan, IR Other Consultants:  None. Anti-infectives:  Unasyn 02/02/2012---> 02/08/2012 Ceftriaxone 2 gm daily for next 2 weeks; PICC line placed   Manson Passey, MD  [facility]  Pager [phone]   Discharge Exam: Filed Vitals:   02/08/12 0513  BP: 100/85  Pulse: 92  Temp: 97.6 F (36.4 C)  Resp: 16  Filed Vitals:    02/07/12 1408 02/07/12 2154 02/08/12 0500 02/08/12 0513  BP: 140/65 131/64  100/85  Pulse: 101 94  92  Temp: 97.2 F (36.2 C) 98.4 F (36.9 C)  97.6 F (36.4 C)  TempSrc: Oral Oral  Oral  Resp: 18 18  16   Height:      Weight:   45.9 kg (101 lb 3.1 oz)   SpO2: 98% 97%  96%    General: Pt is alert, follows commands appropriately, not in acute distress Cardiovascular: Regular rate and rhythm, S1/S2 +, no murmurs, no rubs, no gallops Respiratory: Clear to auscultation bilaterally, no wheezing, no crackles, no rhonchi Abdominal: Soft, non tender, non distended, bowel sounds +, no guarding; percutaneous drain in place Extremities: no edema, no cyanosis, pulses palpable bilaterally DP and PT Neuro: Grossly nonfocal  Discharge Instructions  Discharge Orders    Future Appointments: Provider: Department: Dept Phone: Center:   03/17/2012 8:00 AM [facility] [phone] [place]     Future Orders Please Complete By Expires   Diet - low sodium heart healthy      Increase activity slowly      Discharge instructions      Comments:   Dressing changes every other day with daily normal saline flushes 5 cc and drainage output recordings Rocephin 2 gm IV every day for 2 weeks   Call MD for:  persistant nausea and vomiting      Call MD for:  severe uncontrolled pain      Call MD for:  difficulty breathing, headache or visual disturbances      Call MD for:  persistant dizziness or light-headedness          Medication List     As of 02/08/2012 10:43 AM    TAKE these medications         acetaminophen 500 MG tablet   Commonly known as: TYLENOL   Take 1,000 mg by mouth every 6 (six) hours as needed. pain      amitriptyline 50 MG tablet   Commonly known as: ELAVIL   Take 1 tablet (50 mg total) by mouth at bedtime.      atorvastatin 20 MG tablet   Commonly known as: LIPITOR   Take 1 tablet (20 mg total) by mouth daily.      Calcium  600+D 600-400 MG-UNIT per tablet   Generic drug: Calcium Carbonate-Vitamin D   Take 1 tablet by mouth daily.      dextrose 5 % SOLN 50 mL with cefTRIAXone 2 G SOLR 2 g   Inject 2 g into the vein daily.      esomeprazole 40 MG capsule   Commonly known as: NEXIUM   Take 1 capsule (40 mg total) by mouth daily before breakfast.      EXCEDRIN EXTRA STRENGTH 250-250-65 MG per tablet   Generic drug: aspirin-acetaminophen-caffeine   Take 2 tablets by mouth every 6 (six) hours as needed.      EYE VITAMINS PO   Take 1 tablet by mouth daily.      leflunomide 20 MG tablet   Commonly known as: ARAVA   Take 20 mg by mouth daily.      metoprolol succinate 100 MG 24 hr tablet   Commonly known as: TOPROL-XL   Take 1 tablet (100 mg total) by mouth daily.      predniSONE 5 MG tablet   Commonly known as: DELTASONE   Take 5 mg  by mouth daily.           Follow-up Information    Follow up with NEWMAN,DAVID H, MD. Schedule an appointment as soon as possible for a visit in 6 weeks.   Contact information:   [address] [place] [address] [phone]           The results of significant diagnostics from this hospitalization (including imaging, microbiology, ancillary and laboratory) are listed below for reference.    Significant Diagnostic Studies: Ct Abdomen Pelvis Wo Contrast  02/02/2012  *RADIOLOGY REPORT*  Clinical Data: Abdominal pain, evaluate hiatal hernia, chest pain, decreased oxygen saturation, nausea for 1  CT CHEST, ABDOMEN AND PELVIS WITHOUT CONTRAST  Technique:  Multidetector CT imaging of the chest, abdomen and pelvis was performed following the standard protocol without IV contrast.  Comparison:  CT chest, abdomen pelvis - 03/16/2009; gallbladder ultrasound - earlier same day  CT CHEST  Findings:  Previously noted large hiatal hernia previously contained the stomach as well as a short segments of transverse colon.  The large hiatal hernia and now contains the  entirety of a markedly distended stomach.  There is no definitive evidence of obstruction, as ingested enteric contrast passes through the stomach and into the duodenum and proximal small bowel.  The large hiatal hernia is again noted to result in mass effect upon the heart, in particular the left atrium.  Normal heart size.  Coronary artery calcifications.  Calcifications of the mitral valve annulus and within the aortic aortic valve leaflets.  No pericardial effusion.  Symmetric dependent subpleural ground-glass atelectasis.  No focal airspace opacities.  No pleural effusion or pneumothorax.  The central pulmonary airways are widely patent.  No definite mediastinal, hilar or axillary lymphadenopathy.  No acute or aggressive osseous abnormalities within the chest.  IMPRESSION: The entirety of a markedly distended stomach is seen within the large hiatal hernia.  This does not result in enteric obstruction as ingested enteric contrast passes through the stomach and is seen within the duodenum and proximal small bowel.  CT ABDOMEN AND PELVIS  Findings:  The lack of intravenous contrast limits the ability to evaluate solid abdominal organs.  Normal hepatic contour. There is a large (approximately 1.5 x 1.7 cm stone within the neck of a distended gallbladder.  There is a minimal amount of apparent gallbladder wall thickening (measuring approximately 7 mm in diameter (image 41, series 2).  These findings are also associated with a minimal amount of apparent pericholecystic fluid on this noncontrast examination.  The common bile duct is dilated measuring approximately 9 mm in short axis diameter (image 35, series 2).  No definite intrahepatic biliary ductal dilatation on this noncontrast examination. There is a trace amount of fluid adjacent to the inferior aspect of the right lobe of the liver.  Normal noncontrast appearance of the bilateral kidneys.  Possible vascular calcification within the right renal hilum.  No  definite renal stones or evidence of urinary obstruction.  No definite perinephric stranding.  Normal noncontrast appearance of the bilateral adrenal glands, pancreas and spleen.  Large hiatal hernia as described in the preceding chest CT.  The rectum and distal sigmoid colon is mildly patulous.  The bowel is otherwise normal in course and caliber without wall thickening or evidence of obstruction.  No pneumoperitoneum, pneumatosis or portal venous gas.  Moderate amount of calcified atherosclerotic plaque within a mildly tortuous and normal caliber abdominal aorta.  Right external iliac pelvic sidewall lymphadenopathy has increased  in the interval with index no conglomeration now measuring approximately 4.6 x 2.4 cm (image 65, series 2, previously, 4.3 x 2.3 cm.  Interval development of both the right inguinal lymphadenopathy with index no conglomeration now measuring approximately 4.1 x 2.5 cm.  No definite retroperitoneal or mesenteric adenopathy on this noncontrast examination.  Post hysterectomy.  No discrete adnexal lesion.  No free fluid in the pelvis.  IMPRESSION:  1.  Cholelithiasis with findings worrisome for acute cholecystitis. Further evaluation with a HIDA scan may be performed as clinically indicated.  2. Interval progression of right external iliac and inguinal lymphadenopathy again worrisome for malignancy.  If not previously performed, percutaneous sampling may be obtained as indicated.  Above findings discussed with Dr. Rhunette Croft at 2039.   Original Report Authenticated By: Tacey Ruiz, MD    Ct Chest Wo Contrast  02/03/2012  *RADIOLOGY REPORT*  Clinical Data: Abdominal pain, evaluate hiatal hernia, chest pain, decreased oxygen saturation, nausea for 1  CT CHEST, ABDOMEN AND PELVIS WITHOUT CONTRAST  Technique:  Multidetector CT imaging of the chest, abdomen and pelvis was performed following the standard protocol without IV contrast.  Comparison:  CT chest, abdomen pelvis - 03/16/2009;  gallbladder ultrasound - earlier same day  CT CHEST  Findings:  Previously noted large hiatal hernia previously contained the stomach as well as a short segments of transverse colon.  The large hiatal hernia and now contains the entirety of a markedly distended stomach.  There is no definitive evidence of obstruction, as ingested enteric contrast passes through the stomach and into the duodenum and proximal small bowel.  The large hiatal hernia is again noted to result in mass effect upon the heart, in particular the left atrium.  Normal heart size.  Coronary artery calcifications.  Calcifications of the mitral valve annulus and within the aortic aortic valve leaflets.  No pericardial effusion.  Symmetric dependent subpleural ground-glass atelectasis.  No focal airspace opacities.  No pleural effusion or pneumothorax.  The central pulmonary airways are widely patent.  No definite mediastinal, hilar or axillary lymphadenopathy.  No acute or aggressive osseous abnormalities within the chest.  IMPRESSION: The entirety of a markedly distended stomach is seen within the large hiatal hernia.  This does not result in enteric obstruction as ingested enteric contrast passes through the stomach and is seen within the duodenum and proximal small bowel.  CT ABDOMEN AND PELVIS  Findings:  The lack of intravenous contrast limits the ability to evaluate solid abdominal organs.  Normal hepatic contour. There is a large (approximately 1.5 x 1.7 cm stone within the neck of a distended gallbladder.  There is a minimal amount of apparent gallbladder wall thickening (measuring approximately 7 mm in diameter (image 41, series 2).  These findings are also associated with a minimal amount of apparent pericholecystic fluid on this noncontrast examination.  The common bile duct is dilated measuring approximately 9 mm in short axis diameter (image 35, series 2).  No definite intrahepatic biliary ductal dilatation on this noncontrast  examination. There is a trace amount of fluid adjacent to the inferior aspect of the right lobe of the liver.  Normal noncontrast appearance of the bilateral kidneys.  Possible vascular calcification within the right renal hilum.  No definite renal stones or evidence of urinary obstruction.  No definite perinephric stranding.  Normal noncontrast appearance of the bilateral adrenal glands, pancreas and spleen.  Large hiatal hernia as described in the preceding chest CT.  The rectum and distal sigmoid colon  is mildly patulous.  The bowel is otherwise normal in course and caliber without wall thickening or evidence of obstruction.  No pneumoperitoneum, pneumatosis or portal venous gas.  Moderate amount of calcified atherosclerotic plaque within a mildly tortuous and normal caliber abdominal aorta.  Right external iliac pelvic sidewall lymphadenopathy has increased in the interval with index no conglomeration now measuring approximately 4.6 x 2.4 cm (image 65, series 2, previously, 4.3 x 2.3 cm.  Interval development of both the right inguinal lymphadenopathy with index no conglomeration now measuring approximately 4.1 x 2.5 cm.  No definite retroperitoneal or mesenteric adenopathy on this noncontrast examination.  Post hysterectomy.  No discrete adnexal lesion.  No free fluid in the pelvis.  IMPRESSION:  1.  Cholelithiasis with findings worrisome for acute cholecystitis. Further evaluation with a HIDA scan may be performed as clinically indicated.  2. Interval progression of right external iliac and inguinal lymphadenopathy again worrisome for malignancy.  If not previously performed, percutaneous sampling may be obtained as indicated.  Above findings discussed with Dr. Rhunette Croft at 2039.   Original Report Authenticated By: Tacey Ruiz, MD    Nm Hepatobiliary  02/03/2012  *RADIOLOGY REPORT*  Clinical Data:  Evaluate for cholecystitis. Gallbladder distention and gallstones.  NUCLEAR MEDICINE HEPATOBILIARY IMAGING   Technique:  Sequential images of the abdomen were obtained out to 60 minutes following intravenous administration of radiopharmaceutical.  Radiopharmaceutical:  6.72mCi Tc-50m Choletec  Comparison:  Abdominal CT 02/02/2012  Findings: The radiopharmaceutical was taken up by the liver and excreted into the biliary system.  Activity is identified in the small bowel.  There is no filling of the gallbladder at 60 minutes.  Another 60 minutes of imaging was performed after the administration of 2 mg of morphine.  There is no filling of the gallbladder after the administration of morphine.  IMPRESSION: The gallbladder does not fill on this examination.  Findings are consistent with the cystic duct obstruction and cholecystitis.   Original Report Authenticated By: Richarda Overlie, M.D.    US Abdomen Complete  02/02/2012  **ADDENDUM** CREATED: 02/02/2012 20:43:18  In hind sight, there is a minimal amount of gallbladder wall thickening seen on images 75-77.  Again, this finding is without associated pericholecystic fluid or a sonographic Murphy's sign and remains indeterminate for acute cholecystitis.  Further evaluation with a HIDA scan may be performed as clinically indicated.  **END ADDENDUM** SIGNED BY: Dyanne Carrel, MD   02/02/2012  *RADIOLOGY REPORT*  Clinical Data:  Abdominal pain  COMPLETE ABDOMINAL ULTRASOUND  Comparison:  CT abdomen pelvis - 03/16/2009  Findings:  Gallbladder:  There are at least three shadowing echogenic gallstones within an otherwise normal-appearing gallbladder, the largest of which measures approximately 2 cm in diameter.  No gallbladder wall thickening or pericholecystic fluid.  Negative sonographic Murphy's sign.  Common bile duct:  Normal in size for age, measuring approximately 5.4 mm in diameter.  Liver:  There is mild diffuse slightly coarsened heterogeneous echotexture of the hepatic parenchyma.  No discrete intrahepatic lesions.  No intrahepatic biliary duct dilatation.  No  ascites.  IVC:  Appears normal.  Pancreas:  Limited visualization of the pancreatic head and neck is normal.  Visualization of the pancreatic body and tail is obscured by bowel gas.  Spleen:  Normal in size measuring 6.8 cm in length.  Right Kidney:  Normal cortical thickness, echogenicity and size, measuring 8.8 cm in length.  No focal renal lesions.  No echogenic renal stones.  No urinary obstruction.  Left Kidney:  Normal cortical thickness, echogenicity and size, measuring 10.6 cm in length.  No focal renal lesions.  No echogenic renal stones.  No urinary obstruction.  Abdominal aorta:  No aneurysm identified.  IMPRESSION: 1.  Cholelithiasis without evidence of cholecystitis.  Note, gallstones were seen on remote abdominal CT performed 03/2009.  2. Mild nonspecific coarsening of the hepatic parenchymal echotexture without discrete hepatic lesion.  Original Report Authenticated By: Tacey Ruiz, MD    Ir Perc Cholecystostomy  02/05/2012  *RADIOLOGY REPORT*  IR PERCUTANEOUS CHOLECYSTOSTOMY TUBE PLACEMENT  Date: 02/05/2012  Clinical History: 78 year old female with a history of biliary colic and current admission for acute on chronic cholecystitis diagnosed by nuclear medicine HIDA scan. She has a marginal surgical candidate prefers to avoid surgery at this time if possible.  She presents for placement of a percutaneous cholecystostomy tube.  Procedures Performed: 1. Ultrasound-guided puncture of the gallbladder transhepatic fashion 2.  Placement of a 10-French cholecystostomy tube under fluoroscopic guidance 3.  Limited cholangiogram  Interventional Radiologist:  Sterling Big, MD  Sedation: Moderate (conscious) sedation was used.  One mg Versed, 50 mcg Fentanyl were administered intravenously.  The patient's vital signs were monitored continuously by radiology nursing throughout the procedure.  Sedation Time: 20 minutes  Fluoroscopy time: 1.3 minutes  Contrast volume: 15 ml Omnipaque-300 administered  into the gallbladder lumen  PROCEDURE/FINDINGS:   Informed consent was obtained from the patient following explanation of the procedure, risks, benefits and alternatives. The patient understands, agrees and consents for the procedure. All questions were addressed. A time out was performed.  Maximal barrier sterile technique utilized including caps, mask, sterile gowns, sterile gloves, large sterile drape, hand hygiene, and betadine skin prep.  The right hemiabdomen was interrogated with ultrasound.  As seen on prior CT, she has a relatively low- lying liver and in inferiorly-directed gallbladder which comes all the way down to the iliac crest.  A suitable window passing through a small amount of peripheral liver into the gallbladder lumen was selected and marked.  Local anesthesia was achieved by infiltration of 1% lidocaine.  Under direct ultrasound guidance, a 21 gauge Accustick needle was advanced through the body wall, hepatic parenchyma and into the gallbladder lumen.  An image was saved and stored for the medical record.  A micro wire was coiled in the gallbladder lumen and the Accustick sheath advanced over the wire. Correct placement was confirmed by immediate return of purulent biliary fluid.  An Amplatz wire was then advanced into the gallbladder lumen and the tract serially dilated to 10-French. After this, a Cook all-purpose drainage catheter was advanced over the wire and positioned within the gallbladder lumen.  The locking pigtail loop was formed.  Aspiration of 20 ml of purulent brown biliary fluid containing debris was successfully aspirated.  A gentle hand injection of contrast material confirms tube placement within the gallbladder as well as a large filling defect in the gallbladder neck consistent with an impacted stone.  The drain was secured to the skin with Prolene suture, bumper and an adhesive fixation device.  There is no immediate complication, the patient tolerated the procedure well.   The tube was connected to gravity bag drainage.  IMPRESSION:  1.  Successful placement of a percutaneous transhepatic 10-French cholecystostomy tube with return of purulent appearing in debris filled bowel.  A sample was sent to the laboratory for culture.  Drain management:  The patient and her son are aware that given her calculus acute on  chronic cholecystitis she may require long-term care and maintenance of her new cholecystostomy tube until she is both fit and willing to undergo interval cholecystectomy.  She will return interventional radiology in 4 - 6 weeks for her first tube check and change. After that, she will likely require routine tube check and changes every 6 - 8 weeks.  While it is possible to remove the tube after her acute episode has resolved.  Patients with calculus cholecystitis have relatively high rate of recurrent cholecystitis.  Signed,  Sterling Big, MD Vascular & Interventional Radiologist [facility]  Original Report Authenticated By: Malachy Moan, M.D.    Dg Abd 2 Views  02/02/2012  *RADIOLOGY REPORT*  Clinical Data: Right upper quadrant abdominal pain, shortness of breath  ABDOMEN - 2 VIEW  Comparison: None.  Findings:  There is a large air and fluid containing retrocardiac structure which occupies the majority of the right lower hemithorax favored to represent a large hiatal hernia.  In the absence of prior examinations, the stability of this finding is indeterminate.  There is a minimal amount of distal colonic gas.  Otherwise, there is a paucity of colonic gas.  No definite pneumoperitoneum, pneumatosis or portal venous gas.  Vascular calcifications.  Moderate to severe multilevel thoracolumbar spine degenerative change.  IMPRESSION: Large hiatal hernia containing the majority of the stomach.  In the absence of prior examinations, the stability of this finding is indeterminate.  In the setting of acute right upper quadrant abdominal pain, a gastric volvulus  could have a similar appearance. Clinical correlation is advised.   Original Report Authenticated By: Tacey Ruiz, MD     Microbiology: Recent Results (from the past 240 hour(s))  URINE CULTURE     Status: Normal   Collection Time   02/02/12  3:20 PM      Component Value Range Status Comment   Culture ESCHERICHIA COLI   Final    Colony Count >=100,000 COLONIES/ML   Final    Organism ID, Bacteria ESCHERICHIA COLI   Final   CULTURE, BLOOD (ROUTINE X 2)     Status: Normal   Collection Time   02/02/12  9:16 PM      Component Value Range Status Comment   Specimen Description BLOOD RIGHT FOREARM   Final    Special Requests BOTTLES DRAWN AEROBIC AND ANAEROBIC 3CC   Final    Culture  Setup Time 02/03/2012 00:26   Final    Culture     Final    Value: ESCHERICHIA COLI     Note: Gram Stain Report Called to,Read Back By and Verified With: COURTNEY K @ 0816 02/04/12 BY KRAWS   Report Status 02/05/2012 FINAL   Final    Organism ID, Bacteria ESCHERICHIA COLI   Final   CULTURE, BLOOD (ROUTINE X 2)     Status: Normal (Preliminary result)   Collection Time   02/02/12 10:03 PM      Component Value Range Status Comment   Specimen Description BLOOD RIGHT ARM   Final    Special Requests BOTTLES DRAWN AEROBIC ONLY    Final    Culture  Setup Time 02/03/2012 00:26   Final    Culture     Final    Value:        BLOOD CULTURE RECEIVED NO GROWTH TO DATE CULTURE WILL BE HELD FOR 5 DAYS BEFORE ISSUING A FINAL NEGATIVE REPORT   Report Status PENDING   Incomplete   MRSA PCR SCREENING  Status: Normal   Collection Time   02/02/12 11:03 PM      Component Value Range Status Comment   MRSA by PCR NEGATIVE  NEGATIVE Final   BODY FLUID CULTURE     Status: Normal   Collection Time   02/05/12  7:45 AM      Component Value Range Status Comment   Specimen Description ABSCESS BILE FROM GALLBLADDER   Final    Special Requests NONE   Final    Gram Stain     Final    Value: NO WBC SEEN     NO SQUAMOUS  EPITHELIAL CELLS SEEN     NO ORGANISMS SEEN   Culture RARE ESCHERICHIA COLI   Final    Report Status 02/07/2012 FINAL   Final    Organism ID, Bacteria ESCHERICHIA COLI   Final   CULTURE, BLOOD (ROUTINE X 2)     Status: Normal (Preliminary result)   Collection Time   02/06/12  2:50 PM      Component Value Range Status Comment   Specimen Description BLOOD LEFT HAND   Final    Special Requests BOTTLES DRAWN AEROBIC AND ANAEROBIC 5CC EACH   Final    Culture  Setup Time 02/06/2012 18:45   Final    Culture     Final    Value:        BLOOD CULTURE RECEIVED NO GROWTH TO DATE CULTURE WILL BE HELD FOR 5 DAYS BEFORE ISSUING A FINAL NEGATIVE REPORT   Report Status PENDING   Incomplete   CULTURE, BLOOD (ROUTINE X 2)     Status: Normal (Preliminary result)   Collection Time   02/06/12  3:00 PM      Component Value Range Status Comment   Specimen Description BLOOD RIGHT ARM   Final    Special Requests BOTTLES DRAWN AEROBIC AND ANAEROBIC 3CC EACH   Final    Culture  Setup Time 02/06/2012 18:45   Final    Culture     Final    Value:        BLOOD CULTURE RECEIVED NO GROWTH TO DATE CULTURE WILL BE HELD FOR 5 DAYS BEFORE ISSUING A FINAL NEGATIVE REPORT   Report Status PENDING   Incomplete      Labs: Basic Metabolic Panel:  Lab 02/07/12 4782 02/06/12 0525 02/05/12 0501 02/02/12 1630  NA 136 139 136 135  K 4.2 4.3 2.9* 3.9  CL 109 108 108 98  CO2 21 21 22 25   GLUCOSE 98 90 105* 101*  BUN 8 7 7 13   CREATININE 0.44* 0.43* 0.44* 0.50  CALCIUM 8.7 8.5 8.0* 9.3  MG -- -- -- 2.0  PHOS -- -- -- --   Liver Function Tests:  Lab 02/07/12 0434 02/03/12 0346 02/02/12 1630  AST 16 26 41*  ALT 6 9 11   ALKPHOS 107 69 83  BILITOT 0.3 0.4 0.5  PROT 5.6* 5.4* 7.0  ALBUMIN 2.2* 2.7* 3.6    Lab 02/02/12 1630  LIPASE 36  AMYLASE --   No results found for this basename: AMMONIA:5 in the last 168 hours CBC:  Lab 02/06/12 0525 02/05/12 0501 02/02/12 1630 02/02/12 1516  WBC 6.4 9.8 11.6* 12.8*    NEUTROABS -- -- 9.2* --  HGB 9.5* 9.1* 11.9* 12.5  HCT 29.6* 27.9* 36.9 41.7  MCV 96.1 97.2 97.6 100.4*  PLT 162 120* 185 --   Cardiac Enzymes:  Lab 02/02/12 2015 02/02/12 1630  CKTOTAL -- --  CKMB -- --  CKMBINDEX -- --  TROPONINI <0.30 <0.30   BNP: BNP (last 3 results) No results found for this basename: PROBNP:3 in the last 8760 hours CBG: No results found for this basename: GLUCAP:5 in the last 168 hours  Time coordinating discharge: Over 30 minutes  Signed:  Manson Passey, MD  [facility] 02/08/2012, 10:43 AM  Pager #: [phone]

## 2012-02-08 NOTE — Progress Notes (Signed)
Peripherally Inserted Central Catheter/Midline Placement  The IV Nurse has discussed with the patient and/or persons authorized to consent for the patient, the purpose of this procedure and the potential benefits and risks involved with this procedure.  The benefits include less needle sticks, lab draws from the catheter and patient may be discharged home with the catheter.  Risks include, but not limited to, infection, bleeding, blood clot (thrombus formation), and puncture of an artery; nerve damage and irregular heat beat.  Alternatives to this procedure were also discussed. Son present for consent and explanation.  PICC/Midline Placement Documentation        [name] 02/08/2012, 12:08 PM

## 2012-02-09 LAB — CULTURE, BLOOD (ROUTINE X 2): Culture: NO GROWTH

## 2012-02-12 LAB — CULTURE, BLOOD (ROUTINE X 2)

## 2012-02-15 ENCOUNTER — Telehealth (INDEPENDENT_AMBULATORY_CARE_PROVIDER_SITE_OTHER): Payer: Self-pay | Admitting: General Surgery

## 2012-02-15 NOTE — Telephone Encounter (Signed)
Physical and occupation therapy was declined by pt, per home therapist with [facility].

## 2012-02-21 ENCOUNTER — Telehealth (INDEPENDENT_AMBULATORY_CARE_PROVIDER_SITE_OTHER): Payer: Self-pay

## 2012-02-21 ENCOUNTER — Telehealth (INDEPENDENT_AMBULATORY_CARE_PROVIDER_SITE_OTHER): Payer: Self-pay | Admitting: General Surgery

## 2012-02-21 NOTE — Telephone Encounter (Signed)
Raynelle Fanning, nurse with [facility], called to ask about disposition of pt's PICC line.  She is finishing antibiotics at home.  Please advise.

## 2012-02-21 NOTE — Telephone Encounter (Signed)
Called [facility] spoke with Candise Che Rn advised her to call  Dr  Rulon Abide  She was the ordering physician for PICC line. Per Dr. Ezzard Standing  I called patient informed her appt 03/15/12 @ 10:am she s to be here @ 930am gave her phone number and the address.

## 2012-02-24 ENCOUNTER — Other Ambulatory Visit: Payer: Self-pay | Admitting: Oncology

## 2012-03-01 ENCOUNTER — Other Ambulatory Visit: Payer: Self-pay

## 2012-03-01 MED ORDER — METOPROLOL SUCCINATE ER 100 MG PO TB24: 100.0000 mg | ORAL_TABLET | Freq: Every day | ORAL | Status: DC

## 2012-03-09 ENCOUNTER — Telehealth (INDEPENDENT_AMBULATORY_CARE_PROVIDER_SITE_OTHER): Payer: Self-pay

## 2012-03-09 NOTE — Telephone Encounter (Signed)
Spoke with pt's son to reschedule her appt with Dr. Ezzard Standing previously scheduled for 03/15/12, moved to 03/14/12.  He wants to know if she really needs to be seen in the office since she is not a candidate for surgery.  She does have a procedure scheduled on 2/5 in IR to replace perc drain.  Please let him know if she does not need to come for the office visit.

## 2012-03-09 NOTE — Telephone Encounter (Signed)
Advised Patients son  [name] that [name] has an follow up visit with Dr. Ezzard Standing 03/14/12 @ 330p  For cholecystitis- perc drain sx  02/05/12. He verbalized understanding and agreed to date and time.

## 2012-03-10 ENCOUNTER — Telehealth (INDEPENDENT_AMBULATORY_CARE_PROVIDER_SITE_OTHER): Payer: Self-pay

## 2012-03-10 ENCOUNTER — Other Ambulatory Visit: Payer: Self-pay | Admitting: Radiology

## 2012-03-10 NOTE — Telephone Encounter (Signed)
Patients appt with Dr. Ezzard Standing  changed from 03/14/12 to  05/19/12@2p . Appt with  [facility] / Interventional Radiology  03/15/12 noted . Informed Mr Weingarten of changes. Advised him to call if any further questions

## 2012-03-14 ENCOUNTER — Encounter (INDEPENDENT_AMBULATORY_CARE_PROVIDER_SITE_OTHER): Payer: Medicare Other | Admitting: Surgery

## 2012-03-14 ENCOUNTER — Encounter (HOSPITAL_COMMUNITY): Payer: Self-pay | Admitting: Pharmacy Technician

## 2012-03-15 ENCOUNTER — Ambulatory Visit (HOSPITAL_COMMUNITY)
Admission: RE | Admit: 2012-03-15 | Discharge: 2012-03-15 | Disposition: A | Discharge status: Home / Self Care | Payer: Medicare Other | Source: Ambulatory Visit | Attending: Interventional Radiology | Admitting: Interventional Radiology

## 2012-03-15 ENCOUNTER — Encounter (INDEPENDENT_AMBULATORY_CARE_PROVIDER_SITE_OTHER): Payer: Medicare Other | Admitting: Surgery

## 2012-03-15 ENCOUNTER — Other Ambulatory Visit: Payer: Self-pay | Admitting: Interventional Radiology

## 2012-03-15 ENCOUNTER — Encounter (HOSPITAL_COMMUNITY): Payer: Self-pay

## 2012-03-15 ENCOUNTER — Telehealth (INDEPENDENT_AMBULATORY_CARE_PROVIDER_SITE_OTHER): Payer: Self-pay | Admitting: Surgery

## 2012-03-15 DIAGNOSIS — K8000 Calculus of gallbladder with acute cholecystitis without obstruction: Secondary | ICD-10-CM | POA: Insufficient documentation

## 2012-03-15 DIAGNOSIS — I509 Heart failure, unspecified: Secondary | ICD-10-CM | POA: Insufficient documentation

## 2012-03-15 DIAGNOSIS — Z79899 Other long term (current) drug therapy: Secondary | ICD-10-CM | POA: Insufficient documentation

## 2012-03-15 DIAGNOSIS — Z4682 Encounter for fitting and adjustment of non-vascular catheter: Secondary | ICD-10-CM | POA: Insufficient documentation

## 2012-03-15 DIAGNOSIS — K219 Gastro-esophageal reflux disease without esophagitis: Secondary | ICD-10-CM | POA: Insufficient documentation

## 2012-03-15 DIAGNOSIS — I2510 Atherosclerotic heart disease of native coronary artery without angina pectoris: Secondary | ICD-10-CM | POA: Insufficient documentation

## 2012-03-15 DIAGNOSIS — I10 Essential (primary) hypertension: Secondary | ICD-10-CM | POA: Insufficient documentation

## 2012-03-15 DIAGNOSIS — I059 Rheumatic mitral valve disease, unspecified: Secondary | ICD-10-CM | POA: Insufficient documentation

## 2012-03-15 LAB — CBC WITH DIFFERENTIAL/PLATELET
Basophils Absolute: 0 10*3/uL (ref 0.0–0.1)
HCT: 36.6 % (ref 36.0–46.0)
Hemoglobin: 11.8 g/dL — ABNORMAL LOW (ref 12.0–15.0)
Lymphocytes Relative: 25 % (ref 12–46)
Monocytes Absolute: 0.9 10*3/uL (ref 0.1–1.0)
Neutro Abs: 3.6 10*3/uL (ref 1.7–7.7)
RDW: 14.3 % (ref 11.5–15.5)
WBC: 7 10*3/uL (ref 4.0–10.5)

## 2012-03-15 LAB — COMPREHENSIVE METABOLIC PANEL
ALT: 6 U/L (ref 0–35)
AST: 20 U/L (ref 0–37)
Alkaline Phosphatase: 97 U/L (ref 39–117)
CO2: 26 mEq/L (ref 19–32)
Chloride: 101 mEq/L (ref 96–112)
Creatinine, Ser: 0.52 mg/dL (ref 0.50–1.10)
GFR calc non Af Amer: 85 mL/min — ABNORMAL LOW (ref >90)
Total Bilirubin: 0.4 mg/dL (ref 0.3–1.2)

## 2012-03-15 LAB — APTT: aPTT: 30 seconds (ref 24–37)

## 2012-03-15 MED ORDER — IOHEXOL 300 MG/ML  SOLN
50.0000 mL | Freq: Once | INTRAMUSCULAR | Status: AC | PRN
  Administered 2012-03-15: 15 mL

## 2012-03-15 MED ORDER — MIDAZOLAM HCL 2 MG/2ML IJ SOLN
INTRAMUSCULAR | Status: AC
  Filled 2012-03-15: qty 4

## 2012-03-15 MED ORDER — SODIUM CHLORIDE 0.9 % IV SOLN
INTRAVENOUS | Status: DC
  Administered 2012-03-15: 10:00:00 via INTRAVENOUS

## 2012-03-15 MED ORDER — FENTANYL CITRATE 0.05 MG/ML IJ SOLN
INTRAMUSCULAR | Status: AC
  Filled 2012-03-15: qty 4

## 2012-03-15 NOTE — Telephone Encounter (Addendum)
Dr. Grace Isaac called me about [name].  He was able to do a cholangiogam that showed the cystic duct and common bile duct patent.  The perc drain came out during replacement and she is asymptomatic.  He thought it may now be difficult to replace the perc drain.  So we agreed that it was worth leaving the tube out and see how she does.  She has an appt to see me in April, 2014, that she will keep.  I spoke to son, [name], about the tube coming out.  Work phone [phone], then 0. Home number now is [phone].  I discussed the tube coming out, that I would be willing to see his mother earlier if she got sick, but for now, leaving the perc drain out seems the best course.  DN 03/16/2012

## 2012-03-15 NOTE — H&P (Signed)
Chief Complaint: "I'm here to have my tube changed" Referring Physician:Newman HPI: [name] is an 79 y.o. female who had a perc chole drain placed about 5 weeks ago for acute cholecystitis. She was not an operative candidate at the time and drain was successful. She has done well with the drain. She is scheduled for her first routine exchange. PMHx and meds reviewed.  Past Medical History:  Past Medical History  Diagnosis Date  . CAD (coronary artery disease)     2 vessel intervention 2003  . Dyslipidemia   . CHF (congestive heart failure)     single episode  . Mitral regurgitation     mild prolapse anterior & posterior leaflets  . Aortic valve sclerosis     mild moderate calcification, echo, 2009  . HTN (hypertension)     difficult to obtain BP at times.  [name] Kitchen Urethral trauma     bleeding in hospital  . Rheumatoid arthritis     severe - deforming  . Lung disease, interstitial     related to rheumatoid arthritis, also question  of left apical nodule...followed elsewhere..my understanding stabilized  . Carotid artery disease     doppler 12/25/2010,  0-39% bilateral  . Ejection fraction     EF 60%, echo, 11/2007  . Clot     ??? apical clot in the past ?? no longer an issue  . Drug therapy     Prednisone.  . Depression   . GERD (gastroesophageal reflux disease)   . Cataract     Past Surgical History:  Past Surgical History  Procedure Date  . Appendectomy   . Breast surgery   . Coronary angioplasty with stent placement 2003    Family History:  Family History  Problem Relation Age of Onset  . Heart disease Mother   . Heart disease Father     Social History:  reports that she has never smoked. She has never used smokeless tobacco. She reports that she does not drink alcohol or use illicit drugs.  Allergies: No Known Allergies  Medications: acetaminophen (TYLENOL) 500 MG tablet (Taking) Sig - Route: Take 1,000 mg by mouth every 6 (six) hours as needed. pain -  Oral Class: Historical Med Number of times this order has been changed since signing: 2 Order [address] amitriptyline (ELAVIL) 50 MG tablet (Taking) 30 tablet 0 02/08/2012 02/07/2013 Sig - Route: Take 1 tablet (50 mg total) by mouth at bedtime. - Oral Class: Print Number of times this order has been changed since signing: 1 Order [address] aspirin-acetaminophen-caffeine (EXCEDRIN EXTRA STRENGTH) 250-250-65 MG per tablet (Taking) Sig - Route: Take 2 tablets by mouth every 6 (six) hours as needed. - Oral Class: Historical Med atorvastatin (LIPITOR) 20 MG tablet (Taking) Sig - Route: Take 20 mg by mouth every evening. - Oral Class: Historical Med Number of times this order has been changed since signing: 3 Order [address] Calcium Carbonate-Vitamin D (CALCIUM 600+D) 600-400 MG-UNIT per tablet (Taking) Sig - Route: Take 1 tablet by mouth daily. - Oral Class: Historical Med Number of times this order has been changed since signing: 1 Order [address] esomeprazole (NEXIUM) 40 MG capsule (Taking) 30 capsule 9 04/26/2011 04/25/2012 Sig - Route: Take 1 capsule (40 mg total) by mouth daily before breakfast. - Oral Number of times this order has been changed since signing: 1 Order [address] leflunomide (ARAVA) 20 MG tablet (Taking) Sig - Route: Take 20 mg by mouth daily. - Oral Class: Historical Med  Number of times this order has been changed since signing: 2 Order [address] metoprolol succinate (TOPROL-XL) 100 MG 24 hr tablet (Taking) Sig - Route: Take 100 mg by mouth every evening. Take with or immediately following a meal. - Oral Class: Historical Med Number of times this order has been changed since signing: 1 Order [address] Multiple Vitamins-Minerals (EYE VITAMINS PO) (Taking) Sig - Route: Take 1 tablet by mouth daily. - Oral Class: Historical Med Number of times this order has been changed since signing: 2 Order [address] predniSONE (DELTASONE) 5 MG tablet (Taking) Sig - Route: Take 5 mg by mouth  daily   Please HPI for pertinent positives, otherwise complete 10 system ROS negative.  Physical Exam: Blood pressure 127/62, pulse 95, temperature 97.2 F (36.2 C), temperature source Oral, resp. rate 18, height 5\' 4"  (1.626 m), weight 100 lb (45.36 kg), SpO2 95.00%. Body mass index is 17.17 kg/(m^2).   General Appearance:  Alert, cooperative, no distress, appears stated age  Head:  Normocephalic, without obvious abnormality, atraumatic  ENT: Unremarkable  Neck: Supple, symmetrical, trachea midline, no adenopathy, thyroid: not enlarged, symmetric, no tenderness/mass/nodules  Lungs:   Clear to auscultation bilaterally, no w/r/r, respirations unlabored without use of accessory muscles.  Heart:  Regular rate and rhythm, S1, S2 normal, no murmur, rub or gallop. Carotids 2+ without bruit.  Abdomen:   Soft, non-tender, non distended. RUQ drain intact, no redness at site. Bilious output in tubing and bag.  Extremities: Extremities normal, atraumatic, no cyanosis or edema  Neurologic: Normal affect, no gross deficits.   Results for orders placed during the hospital encounter of 03/15/12 (from the past 48 hour(s))  APTT     Status: Normal   Collection Time   03/15/12  9:40 AM      Component Value Range Comment   aPTT 30  24 - 37 seconds   CBC WITH DIFFERENTIAL     Status: Abnormal   Collection Time   03/15/12  9:40 AM      Component Value Range Comment   WBC 7.0  4.0 - 10.5 K/uL    RBC 3.81 (*) 3.87 - 5.11 MIL/uL    Hemoglobin 11.8 (*) 12.0 - 15.0 g/dL    HCT 16.1  09.6 - 04.5 %    MCV 96.1  78.0 - 100.0 fL    MCH 31.0  26.0 - 34.0 pg    MCHC 32.2  30.0 - 36.0 g/dL    RDW 40.9  81.1 - 91.4 %    Platelets 261  150 - 400 K/uL    Neutrophils Relative 52  43 - 77 %    Neutro Abs 3.6  1.7 - 7.7 K/uL    Lymphocytes Relative 25  12 - 46 %    Lymphs Abs 1.8  0.7 - 4.0 K/uL    Monocytes Relative 13 (*) 3 - 12 %    Monocytes Absolute 0.9  0.1 - 1.0 K/uL    Eosinophils Relative 9 (*) 0 - 5 %     Eosinophils Absolute 0.6  0.0 - 0.7 K/uL    Basophils Relative 1  0 - 1 %    Basophils Absolute 0.0  0.0 - 0.1 K/uL   COMPREHENSIVE METABOLIC PANEL     Status: Abnormal   Collection Time   03/15/12  9:40 AM      Component Value Range Comment   Sodium 140  135 - 145 mEq/L    Potassium 3.4 (*) 3.5 - 5.1  mEq/L    Chloride 101  96 - 112 mEq/L    CO2 26  19 - 32 mEq/L    Glucose, Bld 86  70 - 99 mg/dL    BUN 9  6 - 23 mg/dL    Creatinine, Ser 1.61  0.50 - 1.10 mg/dL    Calcium 9.4  8.4 - 09.6 mg/dL    Total Protein 6.9  6.0 - 8.3 g/dL    Albumin 3.7  3.5 - 5.2 g/dL    AST 20  0 - 37 U/L    ALT 6  0 - 35 U/L    Alkaline Phosphatase 97  39 - 117 U/L    Total Bilirubin 0.4  0.3 - 1.2 mg/dL    GFR calc non Af Amer 85 (*) >90 mL/min    GFR calc Af Amer >90  >90 mL/min   PROTIME-INR     Status: Normal   Collection Time   03/15/12  9:40 AM      Component Value Range Comment   Prothrombin Time 12.5  11.6 - 15.2 seconds    INR 0.94  0.00 - 1.49    No results found.  Assessment/Plan Recent cholecystitis s/p perc chole drain 12/28 Discussed image guided exchange, including risks, complications. She wishes for sedation, and that was discussed as well. Labs reviewed, ok Consent signed in chart  Brayton El PA-C 03/15/2012, 10:55 AM

## 2012-03-15 NOTE — Procedures (Signed)
Contrast injection of existing chole tube demonstrates appropriate positioning and function of the chole tube.   Large gallstone remains within the decompressed gallbladder, however the cystic and common bile ducts are widely patent. During attempted exchange, access to the GB was lost. This was discussed with the ordering surgeon, Dr. Ezzard Standing and the decision was made to attempt a trial without the chole tube as opposed to attempt to percutaneous replacement.

## 2012-03-15 NOTE — Progress Notes (Signed)
Patient was verbally discharged by Dr. Grace Isaac. Patients son is along side of patient at discharge.

## 2012-03-17 ENCOUNTER — Ambulatory Visit (HOSPITAL_COMMUNITY): Payer: Medicare Other

## 2012-03-17 ENCOUNTER — Inpatient Hospital Stay (HOSPITAL_COMMUNITY): Admission: RE | Admit: 2012-03-17 | Payer: Medicare Other | Source: Ambulatory Visit

## 2012-03-17 ENCOUNTER — Inpatient Hospital Stay (HOSPITAL_COMMUNITY): Admit: 2012-03-17 | Payer: Medicare Other

## 2012-03-30 ENCOUNTER — Telehealth: Payer: Self-pay | Admitting: Cardiology

## 2012-03-30 NOTE — Telephone Encounter (Signed)
[name] states she was in the hospital in Dec.  She states she had a cardiology consult and wants to know if she still needs to be seen for a yearly cardiology follow up which was due in Nov or if the hospital consult is good enough for a yearly f/u?

## 2012-03-30 NOTE — Telephone Encounter (Signed)
New problem    Patient did not want to disclose any information will discuss when nurse return.

## 2012-03-31 ENCOUNTER — Encounter: Payer: Self-pay | Admitting: Cardiology

## 2012-03-31 DIAGNOSIS — I350 Nonrheumatic aortic (valve) stenosis: Secondary | ICD-10-CM | POA: Insufficient documentation

## 2012-03-31 NOTE — Progress Notes (Signed)
   The patient had been hospitalized in December, 2013. She was seen in consultation by cardiology and was stable. She is calling today to see if her 1 year followup appointment can be made one year from her visit in the hospital.  She did have an echo in the hospital. LV function was normal. Her aortic valve appears to be worse than documented in 2009. She now has moderately severe aortic stenosis.  It will be okay to see her back in one year from her December hospitalization. However we will need to see her in carefully assess whether she needs a followup echo at that time for her aortic stenosis.  Jerral Bonito, MD

## 2012-03-31 NOTE — Telephone Encounter (Signed)
Pt was notified.  

## 2012-03-31 NOTE — Telephone Encounter (Signed)
I dictated a documentation note concerning the patient today. Please review it. It will be okay for her next visit to be in November or December of 2014, which is a 1 year followup to her hospital consult.

## 2012-04-06 ENCOUNTER — Inpatient Hospital Stay (HOSPITAL_COMMUNITY)
Admission: EM | Admit: 2012-04-06 | Discharge: 2012-04-15 | DRG: 375 | Disposition: A | Discharge status: Home / Self Care | Payer: Medicare Other | Attending: Internal Medicine | Admitting: Internal Medicine

## 2012-04-06 ENCOUNTER — Encounter (HOSPITAL_COMMUNITY): Payer: Self-pay | Admitting: *Deleted

## 2012-04-06 DIAGNOSIS — D62 Acute posthemorrhagic anemia: Secondary | ICD-10-CM | POA: Diagnosis present

## 2012-04-06 DIAGNOSIS — I2510 Atherosclerotic heart disease of native coronary artery without angina pectoris: Secondary | ICD-10-CM | POA: Diagnosis present

## 2012-04-06 DIAGNOSIS — K625 Hemorrhage of anus and rectum: Secondary | ICD-10-CM

## 2012-04-06 DIAGNOSIS — C218 Malignant neoplasm of overlapping sites of rectum, anus and anal canal: Principal | ICD-10-CM | POA: Diagnosis present

## 2012-04-06 DIAGNOSIS — R590 Localized enlarged lymph nodes: Secondary | ICD-10-CM | POA: Diagnosis present

## 2012-04-06 DIAGNOSIS — Z681 Body mass index (BMI) 19 or less, adult: Secondary | ICD-10-CM

## 2012-04-06 DIAGNOSIS — K5900 Constipation, unspecified: Secondary | ICD-10-CM | POA: Diagnosis present

## 2012-04-06 DIAGNOSIS — I350 Nonrheumatic aortic (valve) stenosis: Secondary | ICD-10-CM | POA: Diagnosis present

## 2012-04-06 DIAGNOSIS — C210 Malignant neoplasm of anus, unspecified: Secondary | ICD-10-CM

## 2012-04-06 DIAGNOSIS — I059 Rheumatic mitral valve disease, unspecified: Secondary | ICD-10-CM | POA: Diagnosis present

## 2012-04-06 DIAGNOSIS — I359 Nonrheumatic aortic valve disorder, unspecified: Secondary | ICD-10-CM | POA: Diagnosis present

## 2012-04-06 DIAGNOSIS — R636 Underweight: Secondary | ICD-10-CM | POA: Diagnosis present

## 2012-04-06 DIAGNOSIS — I509 Heart failure, unspecified: Secondary | ICD-10-CM | POA: Diagnosis present

## 2012-04-06 DIAGNOSIS — Z79899 Other long term (current) drug therapy: Secondary | ICD-10-CM

## 2012-04-06 DIAGNOSIS — E876 Hypokalemia: Secondary | ICD-10-CM | POA: Diagnosis present

## 2012-04-06 DIAGNOSIS — K449 Diaphragmatic hernia without obstruction or gangrene: Secondary | ICD-10-CM

## 2012-04-06 DIAGNOSIS — K922 Gastrointestinal hemorrhage, unspecified: Secondary | ICD-10-CM

## 2012-04-06 DIAGNOSIS — Z7982 Long term (current) use of aspirin: Secondary | ICD-10-CM

## 2012-04-06 DIAGNOSIS — E785 Hyperlipidemia, unspecified: Secondary | ICD-10-CM | POA: Diagnosis present

## 2012-04-06 DIAGNOSIS — M069 Rheumatoid arthritis, unspecified: Secondary | ICD-10-CM | POA: Diagnosis present

## 2012-04-06 DIAGNOSIS — IMO0002 Reserved for concepts with insufficient information to code with codable children: Secondary | ICD-10-CM

## 2012-04-06 DIAGNOSIS — N390 Urinary tract infection, site not specified: Secondary | ICD-10-CM | POA: Diagnosis present

## 2012-04-06 HISTORY — DX: Hemorrhage of anus and rectum: K62.5

## 2012-04-06 HISTORY — DX: Anemia, unspecified: D64.9

## 2012-04-06 LAB — CBC
HCT: 28.4 % — ABNORMAL LOW (ref 36.0–46.0)
HCT: 25.3 % — ABNORMAL LOW (ref 36.0–46.0)
Hemoglobin: 7.6 g/dL — ABNORMAL LOW (ref 12.0–15.0)
MCH: 31 pg (ref 26.0–34.0)
MCH: 31.2 pg (ref 26.0–34.0)
MCH: 30.9 pg (ref 26.0–34.0)
MCHC: 32.4 g/dL (ref 30.0–36.0)
MCHC: 32.2 g/dL (ref 30.0–36.0)
MCV: 95.6 fL (ref 78.0–100.0)
MCV: 95.1 fL (ref 78.0–100.0)
RDW: 15.3 % (ref 11.5–15.5)
RDW: 15.4 % (ref 11.5–15.5)
RDW: 15.3 % (ref 11.5–15.5)
WBC: 8 10*3/uL (ref 4.0–10.5)

## 2012-04-06 LAB — PREPARE RBC (CROSSMATCH)

## 2012-04-06 LAB — POCT I-STAT, CHEM 8
Calcium, Ion: 0.8 mmol/L — ABNORMAL LOW (ref 1.13–1.30)
Chloride: 109 mEq/L (ref 96–112)
Glucose, Bld: 94 mg/dL (ref 70–99)
HCT: 27 % — ABNORMAL LOW (ref 36.0–46.0)
Hemoglobin: 9.2 g/dL — ABNORMAL LOW (ref 12.0–15.0)
TCO2: 28 mmol/L (ref 0–100)

## 2012-04-06 LAB — ABO/RH: ABO/RH(D): A POS

## 2012-04-06 LAB — VITAMIN B12: Vitamin B-12: 297 pg/mL (ref 211–911)

## 2012-04-06 LAB — RETICULOCYTES
RBC.: 2.66 MIL/uL — ABNORMAL LOW (ref 3.87–5.11)
Retic Count, Absolute: 45.2 10*3/uL (ref 19.0–186.0)
Retic Ct Pct: 1.7 % (ref 0.4–3.1)

## 2012-04-06 LAB — COMPREHENSIVE METABOLIC PANEL
Albumin: 3.2 g/dL — ABNORMAL LOW (ref 3.5–5.2)
BUN: 13 mg/dL (ref 6–23)
Creatinine, Ser: 0.62 mg/dL (ref 0.50–1.10)
GFR calc Af Amer: >90 mL/min (ref >90)
Total Protein: 6.1 g/dL (ref 6.0–8.3)

## 2012-04-06 LAB — IRON AND TIBC
Saturation Ratios: 15 % — ABNORMAL LOW (ref 20–55)
UIBC: 205 ug/dL (ref 125–400)

## 2012-04-06 LAB — OCCULT BLOOD, POC DEVICE: Fecal Occult Bld: POSITIVE — AB

## 2012-04-06 MED ORDER — ONDANSETRON HCL 4 MG/2ML IJ SOLN: 4.0000 mg | Freq: Four times a day (QID) | INTRAMUSCULAR | Status: DC | PRN

## 2012-04-06 MED ORDER — PANTOPRAZOLE SODIUM 40 MG IV SOLR
40.0000 mg | Freq: Once | INTRAVENOUS | Status: AC
  Administered 2012-04-06: 40 mg via INTRAVENOUS
  Filled 2012-04-06: qty 40

## 2012-04-06 MED ORDER — PREDNISONE 5 MG PO TABS
5.0000 mg | ORAL_TABLET | Freq: Every day | ORAL | Status: DC
  Administered 2012-04-06 – 2012-04-14 (×8): 5 mg via ORAL
  Filled 2012-04-06 (×10): qty 1

## 2012-04-06 MED ORDER — PANTOPRAZOLE SODIUM 40 MG IV SOLR
40.0000 mg | Freq: Two times a day (BID) | INTRAVENOUS | Status: DC
  Administered 2012-04-06 – 2012-04-11 (×12): 40 mg via INTRAVENOUS
  Filled 2012-04-06 (×14): qty 40

## 2012-04-06 MED ORDER — CALCIUM CARBONATE-VITAMIN D 600-400 MG-UNIT PO TABS
1.0000 | ORAL_TABLET | Freq: Every day | ORAL | Status: DC
Start: 2012-04-06 — End: 2012-04-06

## 2012-04-06 MED ORDER — FUROSEMIDE 10 MG/ML IJ SOLN
40.0000 mg | Freq: Two times a day (BID) | INTRAMUSCULAR | Status: DC
  Administered 2012-04-06 – 2012-04-08 (×3): 40 mg via INTRAVENOUS
  Filled 2012-04-06 (×7): qty 4

## 2012-04-06 MED ORDER — ATORVASTATIN CALCIUM 20 MG PO TABS
20.0000 mg | ORAL_TABLET | Freq: Every evening | ORAL | Status: DC
  Administered 2012-04-06 – 2012-04-14 (×9): 20 mg via ORAL
  Filled 2012-04-06 (×10): qty 1

## 2012-04-06 MED ORDER — CALCIUM CARBONATE-VITAMIN D 500-200 MG-UNIT PO TABS
1.0000 | ORAL_TABLET | Freq: Every day | ORAL | Status: DC
  Administered 2012-04-06 – 2012-04-15 (×10): 1 via ORAL
  Filled 2012-04-06 (×10): qty 1

## 2012-04-06 MED ORDER — ACETAMINOPHEN 500 MG PO TABS
1000.0000 mg | ORAL_TABLET | Freq: Four times a day (QID) | ORAL | Status: DC | PRN
  Administered 2012-04-06 – 2012-04-12 (×3): 1000 mg via ORAL
  Filled 2012-04-06 (×4): qty 2

## 2012-04-06 MED ORDER — SODIUM CHLORIDE 0.9 % IV SOLN
INTRAVENOUS | Status: DC
  Administered 2012-04-06: 06:00:00 via INTRAVENOUS
  Administered 2012-04-06: 125 mL/h via INTRAVENOUS
  Administered 2012-04-07 – 2012-04-08 (×2): via INTRAVENOUS

## 2012-04-06 MED ORDER — LEFLUNOMIDE 20 MG PO TABS
20.0000 mg | ORAL_TABLET | Freq: Every day | ORAL | Status: DC
  Administered 2012-04-06 – 2012-04-15 (×10): 20 mg via ORAL
  Filled 2012-04-06 (×10): qty 1

## 2012-04-06 MED ORDER — AMITRIPTYLINE HCL 50 MG PO TABS
50.0000 mg | ORAL_TABLET | Freq: Every day | ORAL | Status: DC
  Administered 2012-04-06 – 2012-04-14 (×8): 50 mg via ORAL
  Filled 2012-04-06 (×10): qty 1

## 2012-04-06 MED ORDER — ONDANSETRON HCL 4 MG PO TABS: 4.0000 mg | ORAL_TABLET | Freq: Four times a day (QID) | ORAL | Status: DC | PRN

## 2012-04-06 MED ORDER — METOPROLOL SUCCINATE ER 100 MG PO TB24
100.0000 mg | ORAL_TABLET | Freq: Every evening | ORAL | Status: DC
  Administered 2012-04-06 – 2012-04-14 (×9): 100 mg via ORAL
  Filled 2012-04-06 (×10): qty 1

## 2012-04-06 NOTE — ED Notes (Addendum)
Per EMS: pt started noticing bright red blood in her stools starting around midnight this evening. Pt has had 4 red bowel movements. Pt VSS.

## 2012-04-06 NOTE — Consult Note (Signed)
[facility] Consult Note  Referring Provider: No ref. provider found Primary Care Physician:  Marissa Nakayama, MD Primary Gastroenterologist:  Dr.  Antony Contras Complaint: Rectal bleeding HPI: [name] is an 79 y.o. white female  who presents with painless hematochezia witnessed by her son this morning which appear to be of large amount. She had no lightheadedness or near syncope and no melena. She thinks he's had a colonoscopy sometime between 5 and 10 years ago is not aware of any specific diagnosis.  Past Medical History  Diagnosis Date  . CAD (coronary artery disease)     2 vessel intervention 2003  . Dyslipidemia   . CHF (congestive heart failure)     single episode  . Mitral regurgitation     mild prolapse anterior & posterior leaflets  . Aortic valve sclerosis     mild moderate calcification, echo, 2009  . HTN (hypertension)     difficult to obtain BP at times.  [name] Kitchen Urethral trauma     bleeding in hospital  . Rheumatoid arthritis     severe - deforming  . Lung disease, interstitial     related to rheumatoid arthritis, also question  of left apical nodule...followed elsewhere..my understanding stabilized  . Carotid artery disease     doppler 12/25/2010,  0-39% bilateral  . Ejection fraction     EF 60%, echo, 11/2007  . Clot     ??? apical clot in the past ?? no longer an issue  . Drug therapy     Prednisone.  . Depression   . GERD (gastroesophageal reflux disease)   . Cataract   . Aortic stenosis     Moderately severe, echo, December, 2013    Past Surgical History  Procedure Laterality Date  . Appendectomy    . Breast surgery    . Coronary angioplasty with stent placement  2003     (Not in a hospital admission)  Allergies: No Known Allergies  Family History  Problem Relation Age of Onset  . Heart disease Mother   . Heart disease Father     Social History:  reports that she has never smoked. She has never used smokeless tobacco. She  reports that she does not drink alcohol or use illicit drugs.  Review of Systems: negative except as above   Blood pressure 134/52, pulse 89, temperature 97.4 F (36.3 C), temperature source Oral, resp. rate 21, SpO2 98.00%. Head: Normocephalic, without obvious abnormality, atraumatic Neck: no adenopathy, no carotid bruit, no JVD, supple, symmetrical, trachea midline and thyroid not enlarged, symmetric, no tenderness/mass/nodules Resp: clear to auscultation bilaterally Cardio: regular rate and rhythm, S1, S2 normal, no murmur, click, rub or gallop GI: Abdomen soft nondistended with normoactive bowel sounds. No hepatomegaly masses or guarding. Extremities: extremities normal, atraumatic, no cyanosis or edema  Results for orders placed during the hospital encounter of 04/06/12 (from the past 48 hour(s))  CBC     Status: Abnormal   Collection Time    04/06/12  5:41 AM      Result Value Range   WBC 8.4  4.0 - 10.5 K/uL   RBC 2.97 (*) 3.87 - 5.11 MIL/uL   Hemoglobin 9.2 (*) 12.0 - 15.0 g/dL   HCT 16.1 (*) 09.6 - 04.5 %   MCV 95.6  78.0 - 100.0 fL   MCH 31.0  26.0 - 34.0 pg   MCHC 32.4  30.0 - 36.0 g/dL   RDW 40.9  81.1 - 91.4 %   Platelets 204  150 - 400 K/uL  COMPREHENSIVE METABOLIC PANEL     Status: Abnormal   Collection Time    04/06/12  5:41 AM      Result Value Range   Sodium 141  135 - 145 mEq/L   Potassium 3.4 (*) 3.5 - 5.1 mEq/L   Chloride 105  96 - 112 mEq/L   CO2 27  19 - 32 mEq/L   Glucose, Bld 98  70 - 99 mg/dL   BUN 13  6 - 23 mg/dL   Creatinine, Ser 1.61  0.50 - 1.10 mg/dL   Calcium 8.9  8.4 - 09.6 mg/dL   Total Protein 6.1  6.0 - 8.3 g/dL   Albumin 3.2 (*) 3.5 - 5.2 g/dL   AST 17  0 - 37 U/L   ALT 5  0 - 35 U/L   Alkaline Phosphatase 75  39 - 117 U/L   Total Bilirubin 0.2 (*) 0.3 - 1.2 mg/dL   GFR calc non Af Amer 81 (*) >90 mL/min   GFR calc Af Amer >90  >90 mL/min   Comment:            The eGFR has been calculated     using the CKD EPI equation.     This  calculation has not been     validated in all clinical     situations.     eGFR's persistently     <90 mL/min signify     possible Chronic Kidney Disease.  TYPE AND SCREEN     Status: None   Collection Time    04/06/12  5:45 AM      Result Value Range   ABO/RH(D) A POS     Antibody Screen NEG     Sample Expiration 04/09/2012    ABO/RH     Status: None   Collection Time    04/06/12  5:45 AM      Result Value Range   ABO/RH(D) A POS    OCCULT BLOOD, POC DEVICE     Status: Abnormal   Collection Time    04/06/12  5:59 AM      Result Value Range   Fecal Occult Bld POSITIVE (*) NEGATIVE  POCT I-STAT, CHEM 8     Status: Abnormal   Collection Time    04/06/12  6:32 AM      Result Value Range   Sodium 139  135 - 145 mEq/L   Potassium 5.6 (*) 3.5 - 5.1 mEq/L   Chloride 109  96 - 112 mEq/L   BUN 18  6 - 23 mg/dL   Creatinine, Ser 0.45  0.50 - 1.10 mg/dL   Glucose, Bld 94  70 - 99 mg/dL   Calcium, Ion 4.09 (*) 1.13 - 1.30 mmol/L   TCO2 28  0 - 100 mmol/L   Hemoglobin 9.2 (*) 12.0 - 15.0 g/dL   HCT 81.1 (*) 91.4 - 78.2 %   No results found.  Assessment: Painless lower GI bleed most consistent with diverticular bleed, no obvious evidence of upper GI bleeding ischemic colitis etc. by exam Plan:  Conservative management and monitoring of stools and hemoglobin initially with clear liquid diet. Probably will need colonoscopy at some point. We'll follow with you. Nuriyah Hanline C 04/06/2012, 9:20 AM

## 2012-04-06 NOTE — H&P (Signed)
Internal Medicine Attending Admission Note Date: 04/06/2012  Patient name: XARA PAULDING Medical record number: [id number] Date of birth: 01/06/1926 Age: 79 y.o. Gender: female  I saw and evaluated the patient. I reviewed the resident's note and I agree with the resident's findings and plan as documented in the resident's note.  Chief Complaint(s): Bright red blood per rectum  History - key components related to admission: Patient is 79 year old female with past medical history most significant for external hemorrhoids, coronary artery disease, moderately severe aortic stenosis, severe rheumatoid arthritis, anemia, hypertension, dyslipidemia and depression who was admitted this morning with several episodes of bright red blood per rectum. Patient regularly notes bleeding on the tissue paper while cleaning herself after defecation most likely from her hemorrhoids but last night at about 1 AM she noted bright red blood in the toilet bowl. Patient had several episodes throughout the night and decided to come to the ER in the morning. Patient has not had any bleeding since being in the ER. Patient complains of progressive weakness of last 2 months.  Patient denies dizziness, shortness of breath, chest pain, recent use of NSAIDs,   15 point review of system is negative otherwise.  Physical Exam - key components related to admission:  Filed Vitals:   04/06/12 0730 04/06/12 0815 04/06/12 0900 04/06/12 1013  BP: 123/49 114/67 134/52 121/65  Pulse: 85 87 89 102  Temp:    98.3 F (36.8 C)  TempSrc:    Oral  Resp: 20 23 21 20   Height:    5\' 5"  (1.651 m)  Weight:    101 lb 3.1 oz (45.9 kg)  SpO2: 100% 97% 98% 100%  Physical Exam: General: Vital signs reviewed and noted. He should looks pale and cachectic, in no acute distress; alert, appropriate and cooperative throughout examination.  Head: Normocephalic, atraumatic.  Eyes: PERRL, EOMI, No signs of anemia or jaundince.  Nose: Mucous membranes  moist, not inflammed, nonerythematous.  Throat: Oropharynx nonerythematous, no exudate appreciated.   Neck: No deformities, masses, or tenderness noted.Supple, No carotid Bruits, no JVD.  Lungs:  Normal respiratory effort. Clear to auscultation BL without crackles or wheezes.  Heart: RRR. S1 and S2 normal, patient has 4/6 ejection systolic murmur best heard at right second intercostal space, 3/6 diastolic murmur best heard at mitral area   Abdomen:  BS normoactive. Soft, Nondistended, non-tender.  No masses or organomegaly.  Extremities: No pretibial edema.  Neurologic: A&O X3, CN II - XII are grossly intact. Motor strength is 5/5 in the all 4 extremities, Sensations intact to light touch, Cerebellar signs negative.  Skin: No visible rashes, scars.     Lab results:   Basic Metabolic Panel:  Recent Labs  14/78/29 0541 04/06/12 0632  NA 141 139  K 3.4* 5.6*  CL 105 109  CO2 27  --   GLUCOSE 98 94  BUN 13 18  CREATININE 0.62 0.70  CALCIUM 8.9  --    Liver Function Tests:  Recent Labs  04/06/12 0541  AST 17  ALT 5  ALKPHOS 75  BILITOT 0.2*  PROT 6.1  ALBUMIN 3.2*   CBC:  Recent Labs  04/06/12 0541 04/06/12 0632 04/06/12 1212  WBC 8.4  --  8.0  HGB 9.2* 9.2* 8.3*  HCT 28.4* 27.0* 25.3*  MCV 95.6  --  95.1  PLT 204  --  197   Anemia Panel:  Recent Labs  04/06/12 1212  RETICCTPCT 1.7   Coagulation:  Recent Labs  04/06/12 0910  INR 1.02   Imaging results:  No results found.  Other results: EKG: Not done  Assessment & Plan by Problem:  Principal Problem:   Bright red blood per rectum Active Problems:   Rheumatoid arthritis on chronic steroids   Lymphadenopathy, abdominal   Normocytic anemia   Aortic stenosis  Patient is 79 year old female with past medical history as noted above who came in with bright red blood per rectum. With moderately severe aortic stenosis and bright red blood per rectum this seems to be heyes syndrome. We will  continue to monitor H&H every 12 hours and transfuse blood if patient falls below 8 given her coronary artery disease history.  The patient has had significant weakness in last 2 months, coronary artery disease and now presenting with bright red blood per rectum. I believe that most of her symptoms are related to progressive aortic stenosis. Patient has not been evaluated by cardiology for possible transaortic valve replacement. I will get a cardiology consult in place to evaluate the patient for candidacy for aortic valve replacement. Based on the other literature AV malformations which is most likely the cause of her bleeding have known to disappear after valve replacement.  Treatment for other medical problems as per resident's note.  Lars Mage MD Faculty-Internal Medicine Residency Program Pager [phone]

## 2012-04-06 NOTE — H&P (Signed)
Hospital Admission Note Date: 04/06/2012  Patient name: [name] Medical record number: [id number] Date of birth: 1926/01/21 Age: 79 y.o. Gender: female PCP: Tula Nakayama, MD  Medical Service: Internal medicine teaching service  Attending physician: Dr. Eben Burow    1st Contact: Collier Bullock    Pager: [phone] 2nd Contact: Manson Passey    Pager: [phone] After 5 pm or weekends: 1st Contact:      Pager: [phone] 2nd Contact:      Pager: [phone]  Chief Complaint: Bright red blood per rectum.  History of Present Illness: Patient is a pleasant 72 year woman with past history significant for CAD status post stenting, aortic stenosis, mitral regurg, recent acute cholecystitis, rheumatoid arthritis and other medical problems as per problem list who comes to the ED due to pain is bright red blood per rectum since midnight. Around midnight today she went for a bowel movement and found bright red blood in it. The stool was covered with blood and not just streaks of blood. She denies any associated abdominal pain, rectal pain, tenesmus. She does not have any nausea vomiting, hematemesis. She reports about 5 episodes of bloody bowel movement since midnight- last one about an hour before while in ED. She denies any fever, chills, headache, palpitations, chest pain, shortness of breath. She gets take Tylenol and Excedrin for her arthritis everyday- although does not report any epigastric pain or symptoms of PUD.  She does report progressive slow decrease in appetite and weight loss for past few years, nothing acute.  Meds: Current Outpatient Rx  Name  Route  Sig  Dispense  Refill  . acetaminophen (TYLENOL) 500 MG tablet   Oral   Take 1,000 mg by mouth every 6 (six) hours as needed. pain         . amitriptyline (ELAVIL) 50 MG tablet   Oral   Take 1 tablet (50 mg total) by mouth at bedtime.   30 tablet   0   . aspirin-acetaminophen-caffeine (EXCEDRIN EXTRA STRENGTH) 250-250-65 MG per tablet  Oral   Take 2 tablets by mouth every 6 (six) hours as needed.         [name] Kitchen atorvastatin (LIPITOR) 20 MG tablet   Oral   Take 20 mg by mouth every evening.          . Calcium Carbonate-Vitamin D (CALCIUM 600+D) 600-400 MG-UNIT per tablet   Oral   Take 1 tablet by mouth daily.           [name] Kitchen esomeprazole (NEXIUM) 40 MG capsule   Oral   Take 1 capsule (40 mg total) by mouth daily before breakfast.   30 capsule   9   . leflunomide (ARAVA) 20 MG tablet   Oral   Take 20 mg by mouth daily.          . metoprolol succinate (TOPROL-XL) 100 MG 24 hr tablet   Oral   Take 100 mg by mouth every evening. Take with or immediately following a meal.         . Multiple Vitamins-Minerals (EYE VITAMINS PO)   Oral   Take 1 tablet by mouth daily.          . predniSONE (DELTASONE) 5 MG tablet   Oral   Take 5 mg by mouth daily.             Allergies: Allergies as of 04/06/2012  . (No Known Allergies)   Past Medical History  Diagnosis Date  . CAD (coronary artery disease)  2 vessel intervention 2003  . Dyslipidemia   . CHF (congestive heart failure)     single episode  . Mitral regurgitation     mild prolapse anterior & posterior leaflets  . Aortic valve sclerosis     mild moderate calcification, echo, 2009  . HTN (hypertension)     difficult to obtain BP at times.  [name] Kitchen Urethral trauma     bleeding in hospital  . Rheumatoid arthritis     severe - deforming  . Lung disease, interstitial     related to rheumatoid arthritis, also question  of left apical nodule...followed elsewhere..my understanding stabilized  . Carotid artery disease     doppler 12/25/2010,  0-39% bilateral  . Ejection fraction     EF 60%, echo, 11/2007  . Clot     ??? apical clot in the past ?? no longer an issue  . Drug therapy     Prednisone.  . Depression   . GERD (gastroesophageal reflux disease)   . Cataract   . Aortic stenosis     Moderately severe, echo, December, 2013   Past Surgical  History  Procedure Laterality Date  . Appendectomy    . Breast surgery    . Coronary angioplasty with stent placement  2003   Family History  Problem Relation Age of Onset  . Heart disease Mother   . Heart disease Father    History   Social History  . Marital Status: Widowed    Spouse Name: N/A    Number of Children: N/A  . Years of Education: N/A   Occupational History  . Retired    Social History Main Topics  . Smoking status: Never Smoker   . Smokeless tobacco: Never Used  . Alcohol Use: No  . Drug Use: No  . Sexually Active: Not on file   Other Topics Concern  . Not on file   Social History Narrative   Lives with son in [place], [place]. She is retired.    Review of Systems: As per history of present illness, all other systems reviewed and negative.  Physical Exam: Blood pressure 123/49, pulse 85, temperature 97.4 F (36.3 C), temperature source Oral, resp. rate 20, SpO2 100.00%. Constitutional: Vital signs reviewed.  Patient is old cachectic woman in no acute distress and cooperative with exam. Alert and oriented x3.  Head: Normocephalic and atraumatic Mouth: no erythema or exudates, MMM Eyes: PERRL, EOMI, pale conjunctiva, No scleral icterus.  Neck: Supple, Trachea midline normal ROM, No JVD Cardiovascular: RRR, S1 normal, S2 normal, systolic murmur. Pulmonary/Chest: CTAB, no wheezes, rales, or rhonchi Abdominal: Soft. Non-tender, non-distended, bowel sounds are normal, no masses, organomegaly, or guarding present.  GU: no CVA tenderness Musculoskeletal: No joint deformities, erythema, or stiffness, ROM full and no nontender Hematology: Right inguinal lymph node palpated. Nontender  Neurological: A&O x3, Strength is normal and symmetric bilaterally, cranial nerve II-XII are grossly intact. Skin: Warm, dry and intact. No rash, cyanosis, or clubbing.  Psychiatric: Normal mood and affect. speech and behavior is normal. Judgment and thought content normal.  Cognition and memory are normal.  Rectal exam per EDP: BRBPR with external hemorrhoids presents - no obvious bleeding source by exam   Lab results: Basic Metabolic Panel:  Recent Labs  16/10/96 0541 04/06/12 0632  NA 141 139  K 3.4* 5.6*  CL 105 109  CO2 27  --   GLUCOSE 98 94  BUN 13 18  CREATININE 0.62 0.70  CALCIUM 8.9  --  Liver Function Tests:  Recent Labs  04/06/12 0541  AST 17  ALT 5  ALKPHOS 75  BILITOT 0.2*  PROT 6.1  ALBUMIN 3.2*   CBC:  Recent Labs  04/06/12 0541 04/06/12 0632  WBC 8.4  --   HGB 9.2* 9.2*  HCT 28.4* 27.0*  MCV 95.6  --   PLT 204  --     Assessment & Plan by Problem: Principal Problem:   Bright red blood per rectum Active Problems:   Rheumatoid arthritis on chronic steroids   Lymphadenopathy, abdominal   Normocytic anemia   Aortic stenosis  1) Bright red blood per rectum: Patient with painless BRBPR since midnight. Vitals stable. No hypotension or tachycardia. Rectal exam shows external hemorrhoids without any identifiable source of bleeding and fecal occult blood positive. Last colonoscopy more than 5 years before. Cannot find results.  Patient on chronic steroids and Excedrin- which was her at high risk of upper GI bleeding ( PUD )- although she does not describe any nausea, hematemesis or abdominal pain symptoms. Although upper GI bleeding causing BRBPR should be brisk bleeding showing more hemodynamic changes. Patient with chronic hemorrhoids- which might be the source of bleeding. Recent CT abdomen pelvis done in December 2013 showed enlarging right inguinal and external iliac lymphadenopathy concerning for malignancy- which apparently has not been followed up after hospital discharge. Hemoglobin 9.2<11.8(03/15/2012)<9.5(02/06/2012).  - Admit to telemetry as inpatient. - PT/INR, CBC every 8 hours. - Normal saline at 125 cc per hour. - n.p.o. for now until gastroenterology evaluation. - Gastroenterology consult. Patient  unclear about getting colonoscopy done, although would like to discuss it with gastroenterologist about it and other options. - Blood typing done- will transfuse if hemoglobin less than 7. Although patient has moderate aortic stenosis, it seems to be stable now without any significant cardiac compensation.  2) Anemia: Normocytic anemia.  Hemoglobin 9.2<11.8(03/15/2012)<9.5(02/06/2012). Might have some component of acute blood loss on top of chronic anemia.  - Anemia panel. - Will prepare for transfusion as needed as above.  3) Cardiovascular: History of CAD with stenting in 2003. History of CHF once- but recent echocardiography in December 2013 shows EF 65% with no significant diastolic dysfunction.  Moderate aortic stenosis and also has mitral regurgitation. -  Continue Lipitor, metoprolol.  4) DVT prophylaxis: SCDs  Dispo: Disposition is deferred at this time, awaiting improvement of current medical problems. Anticipated discharge in approximately 2-3 day(s).   The patient does not have a current PCP (KARAM,PHILLIP JEROME, MD), therefore will be requiring OPC follow-up after discharge.   The patient does not have transportation limitations that hinder transportation to clinic appointments.  Signed: Priscilla Kirstein 04/06/2012, 8:54 AM

## 2012-04-06 NOTE — ED Provider Notes (Signed)
History     CSN: [id number]  Arrival date & time 04/06/12  4098   First MD Initiated Contact with Patient 04/06/12 0522      Chief Complaint  Patient presents with  . Rectal Bleeding    (Consider location/radiation/quality/duration/timing/severity/associated sxs/prior treatment) HPI Hx per PT. At home today with onset of rectal bleeding, bright red blood. Multiple episodes. No coumadin or anticoagulants. No ABD pain. Has known hemorrhoids but denies any previous h/o rectal bleeding. No recent illness or constipation. Takes prednisone and excedrin for RA. Bleeding mod in severity. No black or tarry stools.  Past Medical History  Diagnosis Date  . CAD (coronary artery disease)     2 vessel intervention 2003  . Dyslipidemia   . CHF (congestive heart failure)     single episode  . Mitral regurgitation     mild prolapse anterior & posterior leaflets  . Aortic valve sclerosis     mild moderate calcification, echo, 2009  . HTN (hypertension)     difficult to obtain BP at times.  [name] Kitchen Urethral trauma     bleeding in hospital  . Rheumatoid arthritis     severe - deforming  . Lung disease, interstitial     related to rheumatoid arthritis, also question  of left apical nodule...followed elsewhere..my understanding stabilized  . Carotid artery disease     doppler 12/25/2010,  0-39% bilateral  . Ejection fraction     EF 60%, echo, 11/2007  . Clot     ??? apical clot in the past ?? no longer an issue  . Drug therapy     Prednisone.  . Depression   . GERD (gastroesophageal reflux disease)   . Cataract   . Aortic stenosis     Moderately severe, echo, December, 2013    Past Surgical History  Procedure Laterality Date  . Appendectomy    . Breast surgery    . Coronary angioplasty with stent placement  2003    Family History  Problem Relation Age of Onset  . Heart disease Mother   . Heart disease Father     History  Substance Use Topics  . Smoking status: Never Smoker   .  Smokeless tobacco: Never Used  . Alcohol Use: No    OB History   Grav Para Term Preterm Abortions TAB SAB Ect Mult Living                  Review of Systems  Constitutional: Negative for fever and chills.  HENT: Negative for neck pain and neck stiffness.   Eyes: Negative for visual disturbance.  Respiratory: Negative for shortness of breath.   Cardiovascular: Negative for chest pain.  Gastrointestinal: Positive for anal bleeding. Negative for abdominal pain and rectal pain.  Genitourinary: Negative for dysuria.  Musculoskeletal: Negative for back pain.  Skin: Negative for rash.  Neurological: Negative for headaches.  All other systems reviewed and are negative.    Allergies  Review of patient's allergies indicates no known allergies.  Home Medications   Current Outpatient Rx  Name  Route  Sig  Dispense  Refill  . acetaminophen (TYLENOL) 500 MG tablet   Oral   Take 1,000 mg by mouth every 6 (six) hours as needed. pain         . amitriptyline (ELAVIL) 50 MG tablet   Oral   Take 1 tablet (50 mg total) by mouth at bedtime.   30 tablet   0   . aspirin-acetaminophen-caffeine (EXCEDRIN EXTRA STRENGTH)  250-250-65 MG per tablet   Oral   Take 2 tablets by mouth every 6 (six) hours as needed.         [name] Kitchen atorvastatin (LIPITOR) 20 MG tablet   Oral   Take 20 mg by mouth every evening.          . Calcium Carbonate-Vitamin D (CALCIUM 600+D) 600-400 MG-UNIT per tablet   Oral   Take 1 tablet by mouth daily.           [name] Kitchen esomeprazole (NEXIUM) 40 MG capsule   Oral   Take 1 capsule (40 mg total) by mouth daily before breakfast.   30 capsule   9   . leflunomide (ARAVA) 20 MG tablet   Oral   Take 20 mg by mouth daily.          . metoprolol succinate (TOPROL-XL) 100 MG 24 hr tablet   Oral   Take 100 mg by mouth every evening. Take with or immediately following a meal.         . Multiple Vitamins-Minerals (EYE VITAMINS PO)   Oral   Take 1 tablet by mouth daily.           . predniSONE (DELTASONE) 5 MG tablet   Oral   Take 5 mg by mouth daily.             BP 142/64  Pulse 82  Temp(Src) 97.4 F (36.3 C) (Oral)  Resp 19  SpO2 100%  Physical Exam  Constitutional: She is oriented to person, place, and time. She appears well-developed and well-nourished.  HENT:  Head: Normocephalic and atraumatic.  Mouth/Throat: Oropharynx is clear and moist. No oropharyngeal exudate.  Eyes: Conjunctivae and EOM are normal. Pupils are equal, round, and reactive to light. No scleral icterus.  Neck: Neck supple.  Cardiovascular: Normal rate, regular rhythm and intact distal pulses.   Pulmonary/Chest: Effort normal and breath sounds normal. No respiratory distress.  Abdominal: Soft. Bowel sounds are normal. She exhibits no distension. There is no tenderness. There is no rebound and no guarding.  Genitourinary:  BRBPR with external hemorrhoids presents - no obvious bleeding source by exam  Musculoskeletal: Normal range of motion. She exhibits no edema.  Neurological: She is alert and oriented to person, place, and time.  Skin: Skin is warm and dry.    ED Course  Procedures (including critical care time)  Results for orders placed during the hospital encounter of 04/06/12  CBC      Result Value Range   WBC 8.4  4.0 - 10.5 K/uL   RBC 2.97 (*) 3.87 - 5.11 MIL/uL   Hemoglobin 9.2 (*) 12.0 - 15.0 g/dL   HCT 44.0 (*) 10.2 - 72.5 %   MCV 95.6  78.0 - 100.0 fL   MCH 31.0  26.0 - 34.0 pg   MCHC 32.4  30.0 - 36.0 g/dL   RDW 36.6  44.0 - 34.7 %   Platelets 204  150 - 400 K/uL  COMPREHENSIVE METABOLIC PANEL      Result Value Range   Sodium 141  135 - 145 mEq/L   Potassium 3.4 (*) 3.5 - 5.1 mEq/L   Chloride 105  96 - 112 mEq/L   CO2 27  19 - 32 mEq/L   Glucose, Bld 98  70 - 99 mg/dL   BUN 13  6 - 23 mg/dL   Creatinine, Ser 4.25  0.50 - 1.10 mg/dL   Calcium 8.9  8.4 - 95.6 mg/dL   Total  Protein 6.1  6.0 - 8.3 g/dL   Albumin 3.2 (*) 3.5 - 5.2 g/dL   AST  17  0 - 37 U/L   ALT 5  0 - 35 U/L   Alkaline Phosphatase 75  39 - 117 U/L   Total Bilirubin 0.2 (*) 0.3 - 1.2 mg/dL   GFR calc non Af Amer 81 (*) >90 mL/min   GFR calc Af Amer >90  >90 mL/min  OCCULT BLOOD, POC DEVICE      Result Value Range   Fecal Occult Bld POSITIVE (*) NEGATIVE  POCT I-STAT, CHEM 8      Result Value Range   Sodium 139  135 - 145 mEq/L   Potassium 5.6 (*) 3.5 - 5.1 mEq/L   Chloride 109  96 - 112 mEq/L   BUN 18  6 - 23 mg/dL   Creatinine, Ser 1.61  0.50 - 1.10 mg/dL   Glucose, Bld 94  70 - 99 mg/dL   Calcium, Ion 0.96 (*) 1.13 - 1.30 mmol/L   TCO2 28  0 - 100 mmol/L   Hemoglobin 9.2 (*) 12.0 - 15.0 g/dL   HCT 04.5 (*) 40.9 - 81.1 %  TYPE AND SCREEN      Result Value Range   ABO/RH(D) A POS     Antibody Screen PENDING     Sample Expiration 04/09/2012       Date: 04/06/2012  Rate: 84  Rhythm: normal sinus rhythm  QRS Axis: normal  Intervals: normal  ST/T Wave abnormalities: nonspecific ST changes  Conduction Disutrbances:none  Narrative Interpretation: sinus with low voltage  Old EKG Reviewed: none available  IVfs and IV protonix  MED c/s - teaching service to admit  MDM  GI bleeding  Labs. IVfs. IV medications/ protonix  T/S  MEd admit        Sunnie Nielsen, MD 04/06/12 [id number]

## 2012-04-06 NOTE — ED Notes (Signed)
Waiting for bed placement, family provided update. Pt NPO at this time. Pt is alert and oriented.

## 2012-04-06 NOTE — Progress Notes (Signed)
Utilization Review Completed Sanjna Haskew J. Marisella Puccio, RN, BSN, NCM [phone]  

## 2012-04-06 NOTE — Progress Notes (Signed)
Pt iv pump beeping and declined new iv start.  Encourage her to call to have iv changed from Rt. AC.  Pt stated she is fine at this time.  Will continue to monitor.   Merleen Picazo,RN.

## 2012-04-07 LAB — CBC
HCT: 28.3 % — ABNORMAL LOW (ref 36.0–46.0)
Hemoglobin: 9.4 g/dL — ABNORMAL LOW (ref 12.0–15.0)
MCHC: 33.3 g/dL (ref 30.0–36.0)
Platelets: 227 10*3/uL (ref 150–400)
Platelets: 229 10*3/uL (ref 150–400)
RBC: 3.03 MIL/uL — ABNORMAL LOW (ref 3.87–5.11)
RBC: 3.29 MIL/uL — ABNORMAL LOW (ref 3.87–5.11)
RDW: 15.2 % (ref 11.5–15.5)
RDW: 15.4 % (ref 11.5–15.5)
WBC: 8 10*3/uL (ref 4.0–10.5)
WBC: 8.2 10*3/uL (ref 4.0–10.5)

## 2012-04-07 LAB — BASIC METABOLIC PANEL
BUN: 9 mg/dL (ref 6–23)
Creatinine, Ser: 0.58 mg/dL (ref 0.50–1.10)
GFR calc Af Amer: >90 mL/min (ref >90)
GFR calc non Af Amer: 82 mL/min — ABNORMAL LOW (ref >90)

## 2012-04-07 MED ORDER — POTASSIUM CHLORIDE CRYS ER 20 MEQ PO TBCR
40.0000 meq | EXTENDED_RELEASE_TABLET | Freq: Two times a day (BID) | ORAL | Status: AC
  Administered 2012-04-07 (×2): 40 meq via ORAL
  Filled 2012-04-07 (×2): qty 2

## 2012-04-07 MED ORDER — PEG 3350-KCL-NA BICARB-NACL 420 G PO SOLR
4000.0000 mL | Freq: Once | ORAL | Status: AC
Start: 2012-04-07 — End: 2012-04-07
  Administered 2012-04-07: 4000 mL via ORAL
  Filled 2012-04-07: qty 4000

## 2012-04-07 MED ORDER — SODIUM CHLORIDE 0.9 % IV SOLN: INTRAVENOUS | Status: DC

## 2012-04-07 MED ORDER — ENSURE COMPLETE PO LIQD
237.0000 mL | Freq: Two times a day (BID) | ORAL | Status: DC
  Administered 2012-04-09 – 2012-04-10 (×2): 237 mL via ORAL

## 2012-04-07 NOTE — Progress Notes (Signed)
[facility] Progress Note  Subjective: And patient having no abdominal pain. States she's had one or 2 stools with some blood and some normal-looking stool since admission  Objective: Vital signs in last 24 hours: Temp:  [97.8 F (36.6 C)-98.9 F (37.2 C)] 97.8 F (36.6 C) (02/28 0700) Pulse Rate:  [88-102] 93 (02/28 0700) Resp:  [16-20] 16 (02/28 0700) BP: (94-126)/(44-65) 113/52 mmHg (02/28 0700) SpO2:  [99 %-100 %] 99 % (02/28 0700) Weight:  [44.3 kg (97 lb 10.6 oz)-45.9 kg (101 lb 3.1 oz)] 44.3 kg (97 lb 10.6 oz) (02/28 0615) Weight change:    PE: Abdomen soft nontender  Lab Results: Results for orders placed during the hospital encounter of 04/06/12 (from the past 24 hour(s))  CBC     Status: Abnormal   Collection Time    04/06/12 12:12 PM      Result Value Range   WBC 8.0  4.0 - 10.5 K/uL   RBC 2.66 (*) 3.87 - 5.11 MIL/uL   Hemoglobin 8.3 (*) 12.0 - 15.0 g/dL   HCT 81.1 (*) 91.4 - 78.2 %   MCV 95.1  78.0 - 100.0 fL   MCH 31.2  26.0 - 34.0 pg   MCHC 32.8  30.0 - 36.0 g/dL   RDW 95.6  21.3 - 08.6 %   Platelets 197  150 - 400 K/uL  VITAMIN B12     Status: None   Collection Time    04/06/12 12:12 PM      Result Value Range   Vitamin B-12 297  211 - 911 pg/mL  FOLATE     Status: None   Collection Time    04/06/12 12:12 PM      Result Value Range   Folate >20.0    IRON AND TIBC     Status: Abnormal   Collection Time    04/06/12 12:12 PM      Result Value Range   Iron 36 (*) 42 - 135 ug/dL   TIBC 578 (*) 469 - 629 ug/dL   Saturation Ratios 15 (*) 20 - 55 %   UIBC 205  125 - 400 ug/dL  FERRITIN     Status: None   Collection Time    04/06/12 12:12 PM      Result Value Range   Ferritin 44  10 - 291 ng/mL  RETICULOCYTES     Status: Abnormal   Collection Time    04/06/12 12:12 PM      Result Value Range   Retic Ct Pct 1.7  0.4 - 3.1 %   RBC. 2.66 (*) 3.87 - 5.11 MIL/uL   Retic Count, Manual 45.2  19.0 - 186.0 K/uL  CBC     Status: Abnormal   Collection Time    04/06/12  6:37 PM      Result Value Range   WBC 7.6  4.0 - 10.5 K/uL   RBC 2.46 (*) 3.87 - 5.11 MIL/uL   Hemoglobin 7.6 (*) 12.0 - 15.0 g/dL   HCT 52.8 (*) 41.3 - 24.4 %   MCV 95.9  78.0 - 100.0 fL   MCH 30.9  26.0 - 34.0 pg   MCHC 32.2  30.0 - 36.0 g/dL   RDW 01.0  27.2 - 53.6 %   Platelets 177  150 - 400 K/uL  PREPARE RBC (CROSSMATCH)     Status: None   Collection Time    04/06/12  7:20 PM      Result Value Range   Order  Confirmation ORDER PROCESSED BY BLOOD BANK    CBC     Status: Abnormal   Collection Time    04/07/12 12:11 AM      Result Value Range   WBC 8.2  4.0 - 10.5 K/uL   RBC 3.29 (*) 3.87 - 5.11 MIL/uL   Hemoglobin 10.4 (*) 12.0 - 15.0 g/dL   HCT 16.1 (*) 09.6 - 04.5 %   MCV 94.2  78.0 - 100.0 fL   MCH 31.6  26.0 - 34.0 pg   MCHC 33.5  30.0 - 36.0 g/dL   RDW 40.9  81.1 - 91.4 %   Platelets 229  150 - 400 K/uL  BASIC METABOLIC PANEL     Status: Abnormal   Collection Time    04/07/12  6:30 AM      Result Value Range   Sodium 140  135 - 145 mEq/L   Potassium 3.1 (*) 3.5 - 5.1 mEq/L   Chloride 103  96 - 112 mEq/L   CO2 28  19 - 32 mEq/L   Glucose, Bld 94  70 - 99 mg/dL   BUN 9  6 - 23 mg/dL   Creatinine, Ser 7.82  0.50 - 1.10 mg/dL   Calcium 8.8  8.4 - 95.6 mg/dL   GFR calc non Af Amer 82 (*) >90 mL/min   GFR calc Af Amer >90  >90 mL/min  CBC     Status: Abnormal   Collection Time    04/07/12  6:30 AM      Result Value Range   WBC 9.6  4.0 - 10.5 K/uL   RBC 3.24 (*) 3.87 - 5.11 MIL/uL   Hemoglobin 10.1 (*) 12.0 - 15.0 g/dL   HCT 21.3 (*) 08.6 - 57.8 %   MCV 93.5  78.0 - 100.0 fL   MCH 31.2  26.0 - 34.0 pg   MCHC 33.3  30.0 - 36.0 g/dL   RDW 46.9  62.9 - 52.8 %   Platelets 227  150 - 400 K/uL    Studies/Results: No results found.    Assessment: Lower GI bleeding in a patient with aortic stenosis, suspect probable diverticulosis versus AVMs, good response to 1 unit transfuse packed red blood cells to  Plan: Into to monitor  stools and hemoglobin. Probably should have colonoscopy at some point the patient is a little ambivalent about this. Will not range tomorrow and will allow full liquid diet. We can call Dr. will discuss with her whether to proceed and timing either this weekend or next week    Yoana Staib C 04/07/2012, 9:29 AM

## 2012-04-07 NOTE — Progress Notes (Signed)
Subjective: Patient is doing well this morning. She is still opposed to having a colonoscopy since she does think she could tolerating drinking the prep. May consider sigmoidoscopy if warranted.  She had one episode of BRB in BM last night, one normal stool. No abdominal pain.  Denies SOB, chest pain.  Objective: Vital signs in last 24 hours: Filed Vitals:   04/06/12 2230 04/06/12 2330 04/07/12 0615 04/07/12 0700  BP: 125/61 124/60  113/52  Pulse: 89 88  93  Temp: 98.8 F (37.1 C) 98.9 F (37.2 C) 97.8 F (36.6 C) 97.8 F (36.6 C)  TempSrc: Oral Oral  Oral  Resp: 16 18  16   Height:      Weight:   97 lb 10.6 oz (44.3 kg)   SpO2:    99%   Weight change:   Intake/Output Summary (Last 24 hours) at 04/07/12 1050 Last data filed at 04/07/12 0858  Gross per 24 hour  Intake 4111.25 ml  Output   1553 ml  Net 2558.25 ml    Physical Exam Blood pressure 113/52, pulse 93, temperature 97.8 F (36.6 C), temperature source Oral, resp. rate 16, height 5\' 5"  (1.651 m), weight 97 lb 10.6 oz (44.3 kg), SpO2 99.00%. General:  No acute distress, alert and oriented x 3, pale HEENT:  PERRL, EOMI, moist mucous membranes Cardiovascular:  Regular rate and rhythm, 4/6 SEM Respiratory:  Clear to auscultation bilaterally, no wheezes, rales, or rhonchi Abdomen:  Soft, nondistended, nontender, bowel sounds present Extremities:  Warm and well-perfused, no edema.  Skin: Warm, dry, no rashes Neuro: Not anxious appearing, no depressed mood, normal affect  Lab Results: Basic Metabolic Panel:  Recent Labs Lab 04/06/12 0541 04/06/12 0632 04/07/12 0630  NA 141 139 140  K 3.4* 5.6* 3.1*  CL 105 109 103  CO2 27  --  28  GLUCOSE 98 94 94  BUN 13 18 9   CREATININE 0.62 0.70 0.58  CALCIUM 8.9  --  8.8   Liver Function Tests:  Recent Labs Lab 04/06/12 0541  AST 17  ALT 5  ALKPHOS 75  BILITOT 0.2*  PROT 6.1  ALBUMIN 3.2*   CBC:  Recent Labs Lab 04/07/12 0011 04/07/12 0630  WBC 8.2 9.6   HGB 10.4* 10.1*  HCT 31.0* 30.3*  MCV 94.2 93.5  PLT 229 227   Coagulation:  Recent Labs Lab 04/06/12 0910  LABPROT 13.3  INR 1.02   Anemia Panel:  Recent Labs Lab 04/06/12 1212  VITAMINB12 297  FOLATE >20.0  FERRITIN 44  TIBC 241*  IRON 36*  RETICCTPCT 1.7   Medications:  Medications reviewed  Scheduled Meds: . amitriptyline  50 mg Oral QHS  . atorvastatin  20 mg Oral QPM  . calcium-vitamin D  1 tablet Oral Daily  . furosemide  40 mg Intravenous BID  . leflunomide  20 mg Oral Daily  . metoprolol succinate  100 mg Oral QPM  . pantoprazole (PROTONIX) IV  40 mg Intravenous Q12H  . potassium chloride  40 mEq Oral BID  . predniSONE  5 mg Oral QAC lunch   Continuous Infusions: . sodium chloride 125 mL/hr at 04/07/12 0228   PRN Meds:.acetaminophen, ondansetron (ZOFRAN) IV, ondansetron  Assessment/Plan:  Lower GI bleed Patient had one bloody bowel movement last night, 1 normal bowel movement. She did receive one unit of packed red cells yesterday for hemoglobin drop. She continues to deny any abdominal pain. She is also refusing colonoscopy though she may consider a sigmoidoscopy for further evaluation  of the GI bleeding if warranted. Bleeding likely from diverticulosis versus hemorrhoids. Less likely peptic ulcer disease or brisk upper GI bleed given the patient is without abdominal pain and normotensive. Possibly Heydes syndrome, though we have no documented AVMs on endoscopy in patient does not have a history of GI bleeding. Discussed with cardiology yesterday and they recommended outpatient followup and evaluation if patient needs a possible valve replacement. Hgb stable 10.1 today after 1u.  -Continue to monitor CBC -appreciate GI input -Please document and check FOBT on every stool -Transfuse PRBCs as needed  Aortic stenosis Mod-severe AS noted on last ECHO No dizziness Stable, cont to monitor  Anemia Anemia panel with slightly low iron, TIBC, and  saturation. -colonoscopy as above -iron replacement therapy at dc  Lymphadenopathy  CT scan from December 2013 showed enlargement of right external iliac and inguinal lymph nodes worrisome for malignancy. It appears that the patient has not undergone further workup. -recommend percutaneous biopsy as outpatient -age appropriate ca screening  Hypokalemia, mild -replete orally  SCDs for DVT Ppx  Dispo Anticipated discharge in approximately 2-3 day(s).  The patient does not have a current PCP (KARAM,PHILLIP JEROME, MD), therefore will not be requiring OPC follow-up after discharge.  The patient does not have transportation limitations that hinder transportation to clinic appointments.     LOS: 1 day   [place] Ar 04/07/2012, 10:50 AM

## 2012-04-07 NOTE — Progress Notes (Signed)
Nurse and family have been encouraging patient to drink Golytely drink since the time it has been administered and is not drinking as much as she needs to. Explained to patient and family that patient really needs drink at least three quarters of the jug so that the doctors and see exactly what's going on so that they can treat her efficiently. Patient and family understood. After giving report to oncoming nurse, checked to see how much of the jug she was drinking and patient was still on her 1st cup. Reinforced what was said earlier and patient just chuckled and stated that she was going to really try to drink most of it but not all of it. Notified oncoming nurse and will continue to monitor as needed.

## 2012-04-07 NOTE — Progress Notes (Signed)
Patient ID: [name], female   DOB: 04-01-1925, 79 y.o.   MRN: [id number]  Dr. Juel Burrow called me to tell me she had another bloody stool today so we will prep today for colonoscopy to be done tomorrow morning by Dr. Randa Evens (scheduled for 10AM). Son and patient aware and they are agreeable with the plan. Nurse aware. Orders placed.

## 2012-04-07 NOTE — Clinical Documentation Improvement (Signed)
Anemia Blood Loss Clarification  CLINICAL DOCUMENTATION QUERIES ARE NOT PART OF THE PERMANENT MEDICAL RECORD         04/07/12  Dr. Clyde Lundborg and/or Associates,  In an effort to better capture your patient's severity of illness, reflect appropriate length of stay and utilization of resources, a review of the patient medical record has revealed the following indicators:  Patient admitted with Lower GI Bleed  Patient transfused 1 unit of PRBC's so far this admission  Patient continues to have BRBPR  Tentatively scheduled for Colonoscopy 04/08/12   H&H trend this admission: Component     Latest Ref Rng 04/06/2012 04/06/2012 04/06/2012 04/07/2012 04/07/2012         5:41 AM 12:12 PM  6:37 PM 12:11 AM  6:30 AM  Hemoglobin     12.0 - 15.0 g/dL 9.2 (L) 8.3 (L) 7.6 (L) 10.4 (L) 10.1 (L)  HCT     36.0 - 46.0 % 28.4 (L) 25.3 (L) 23.6 (L) 31.0 (L) 30.3 (L)  Platelets     150 - 400 K/uL 204 197 177 229 227   04/07/2012   1:21 PM  9.4 (L)  28.3 (L)  215    Based on your clinical judgment, please document in the progress notes and discharge summary if a condition below provides greater specificity regarding the patient's H&H trend and treatment provided:   - Acute Blood Loss Anemia 2/2 Lower GI Blood Loss   - Other Condition   - Unable to Clinically Determine   In responding to this query please exercise your independent judgment.    The fact that a query is asked, does not imply that any particular answer is desired or expected.   Reviewed: additional documentation in the medical record  Thank You,  Jerral Ralph  RN BSN CCDS Certified Clinical Documentation Specialist: Cell   [phone]  Health Information Management [place]   RESPOND TO THE THIS QUERY, FOLLOW THE INSTRUCTIONS BELOW:  1. If needed, update documentation for the patient's encounter via the notes activity.  2. Access this query again and click edit on the In [employer].  3. After updating, or not,  click F2 to complete all highlighted (required) fields concerning your review. Select "additional documentation in the medical record" OR "no additional documentation provided".  4. Click Sign note button.  5. The deficiency will fall out of your In Basket *Please let us know if you are not able to complete this workflow by phone or e-mail (listed below).

## 2012-04-07 NOTE — Progress Notes (Signed)
Peripheral IV infiltrated this morning and therefore 8am IV Lasix not given. Attending nurse attempted insertion x2 unsuccessful and 1st IV team nurse attempted insertion x2 and was also unsuccessful, 2nd IV team insertion successful but was until the latter part of the day, therefore 8am dose of IV Lasix not given. Will give evening dose.

## 2012-04-07 NOTE — Progress Notes (Signed)
Internal Medicine Teaching Service Attending Note Date: 04/07/2012  Patient name: [name]  Medical record number: [id number]  Date of birth: 10/05/1925    This patient has been seen and discussed with the house staff. Please see their note for complete details. I concur with their findings with the following additions/corrections: Patient had a bowel movement about 30 minutes prior to my visit today. She wanted me to have a look at the commode. The commode was full of bright red blood approximately half a cup with some mixed feces. Patient denies any abdominal pain at this time. BP 113/52  Pulse 93  Temp(Src) 97.8 F (36.6 C) (Oral)  Resp 16  Ht 5\' 5"  (1.651 m)  Wt 97 lb 10.6 oz (44.3 kg)  BMI 16.25 kg/m2  SpO2 99% General: Vital signs reviewed and noted. He should looks pale and cachectic, in no acute distress; alert, appropriate and cooperative throughout examination.  Lungs: Normal respiratory effort. Clear to auscultation BL without crackles or wheezes.  Heart: RRR. S1 and S2 normal, patient has 4/6 ejection systolic murmur best heard at right second intercostal space, 3/6 diastolic murmur best heard at mitral area  Abdomen: BS normoactive. Soft, Nondistended, non-tender. No masses or organomegaly.  Extremities: No pretibial edema.  Neurologic: A&O X3, CN II - XII are grossly intact. Motor strength is 5/5 in the all 4 extremities, Sensations intact to light touch, Cerebellar signs negative.  Skin: No visible rashes, scars.   Assessment and plan: -Patient continues to have GI bleed with bright red blood per rectum as noted and witnessed by me. -We will continue to monitor H&H every 12 hours and transfuse if hemoglobin less than 8. Transfused one unit yesterday and her hemoglobin was 10 this morning  -Plans regarding colonoscopy inpatient versus outpatient not clear at this time, GI following the patient  Rest of the medical management as per resident's note.  Lars Mage 04/07/2012, 1:19 PM

## 2012-04-07 NOTE — Clinical Documentation Improvement (Signed)
BMI DOCUMENTATION CLARIFICATION QUERY  CLINICAL DOCUMENTATION QUERIES ARE NOT PART OF THE PERMANENT MEDICAL RECORD        04/07/12  Dr. Clyde Lundborg and/or Associates,  In an effort to better capture your patient's severity of illness, reflect appropriate length of stay and utilization of resources, a review of the patient medical record has revealed the following indicators:  Registered Dietician Assessment 04/07/12   (please see full consult in [facility] - Tonye Becket, RD)  Nutritional Diagnosis - Underweight  Height - 5\' 5"   Weight - 97 lb 10.6 oz   BMI - 16.25 kg/(m^2).   Patient described as being "cachectic" in H&P    Based on your clinical judgment, please document in the progress notes and discharge summary if a condition below provides greater specificity regarding the patient's nutritional status:   - Underweight, BMI 16.25   - Cachexia, BMI 16.25   - Other Condition   - Unable to Clinically Determine   In responding to this query please exercise your independent judgment.    The fact that a query is asked, does not imply that any particular answer is desired or expected.   Reviewed: additional documentation in the medical record  Thank You,  Jerral Ralph  RN BSN CCDS Certified Clinical Documentation Specialist: Cell   [phone]  Health Information Management [employer]   TO RESPOND TO THE THIS QUERY, FOLLOW THE INSTRUCTIONS BELOW:  1. If needed, update documentation for the patient's encounter via the notes activity.  2. Access this query again and click edit on the In [employer].  3. After updating, or not, click F2 to complete all highlighted (required) fields concerning your review. Select "additional documentation in the medical record" OR "no additional documentation provided".  4. Click Sign note button.  5. The deficiency will fall out of your In Basket *Please let us know if you are not able to complete this workflow by phone or e-mail  (listed below).

## 2012-04-07 NOTE — Progress Notes (Signed)
INITIAL NUTRITION ASSESSMENT  DOCUMENTATION CODES Per approved criteria  -Underweight   INTERVENTION: 1. Ensure Complete po BID, each supplement provides 350 kcal and 13 grams of protein.  2. RD will continue to follow    NUTRITION DIAGNOSIS: Inadequate oral intake  related to poor appetite as evidenced by underweight status.   Goal: PO intake to meet >/=90% estimated nutrition needs   Monitor:  PO intake, weight trends, I/O's   Reason for Assessment: Health History   78 y.o. female  Admitting Dx: Bright red blood per rectum  ASSESSMENT: Pt admitted after having blood in stool overnight. Pt with hx of CAD, RA, HTN. Pt reports ongoing weakness x2 months.   Pt reports fair intake PTA. Unsure if she has lost any true weight recently, reports diuretic use. Does drink Ensure once daily at home. RD will add BID.    Height: Ht Readings from Last 1 Encounters:  04/06/12 5\' 5"  (1.651 m)    Weight: Wt Readings from Last 1 Encounters:  04/07/12 97 lb 10.6 oz (44.3 kg)    Ideal Body Weight: 56.8 kg   % Ideal Body Weight: 78%  Wt Readings from Last 10 Encounters:  04/07/12 97 lb 10.6 oz (44.3 kg)  03/15/12 100 lb (45.36 kg)  02/08/12 101 lb 3.1 oz (45.9 kg)  02/02/12 103 lb 3.2 oz (46.811 kg)  12/29/10 110 lb (49.896 kg)  11/20/09 114 lb (51.71 kg)  10/28/08 112 lb (50.803 kg)    Usual Body Weight: 103 lbs   % Usual Body Weight: 94%  BMI:  Body mass index is 16.25 kg/(m^2). Underweight   Estimated Nutritional Needs: Kcal: 1200-1400 Protein: 45-55 gm Fluid: >/= 1.2 L/day   Skin: intact   Diet Order: Full Liquid  EDUCATION NEEDS: -No education needs identified at this time   Intake/Output Summary (Last 24 hours) at 04/07/12 1144 Last data filed at 04/07/12 0858  Gross per 24 hour  Intake 4111.25 ml  Output   1553 ml  Net 2558.25 ml    Last BM: PTA   Labs:   Recent Labs Lab 04/06/12 0541 04/06/12 0632 04/07/12 0630  NA 141 139 140  K 3.4*  5.6* 3.1*  CL 105 109 103  CO2 27  --  28  BUN 13 18 9   CREATININE 0.62 0.70 0.58  CALCIUM 8.9  --  8.8  GLUCOSE 98 94 94    CBG (last 3)  No results found for this basename: GLUCAP,  in the last 72 hours  Scheduled Meds: . amitriptyline  50 mg Oral QHS  . atorvastatin  20 mg Oral QPM  . calcium-vitamin D  1 tablet Oral Daily  . furosemide  40 mg Intravenous BID  . leflunomide  20 mg Oral Daily  . metoprolol succinate  100 mg Oral QPM  . pantoprazole (PROTONIX) IV  40 mg Intravenous Q12H  . potassium chloride  40 mEq Oral BID  . predniSONE  5 mg Oral QAC lunch    Continuous Infusions: . sodium chloride 125 mL/hr at 04/07/12 9604    Past Medical History  Diagnosis Date  . CAD (coronary artery disease)     2 vessel intervention 2003  . Dyslipidemia   . CHF (congestive heart failure)     single episode  . Mitral regurgitation     mild prolapse anterior & posterior leaflets  . Aortic valve sclerosis     mild moderate calcification, echo, 2009  . HTN (hypertension)     difficult to  obtain BP at times.  [name] Kitchen Urethral trauma     bleeding in hospital  . Rheumatoid arthritis     severe - deforming  . Lung disease, interstitial     related to rheumatoid arthritis, also question  of left apical nodule...followed elsewhere..my understanding stabilized  . Carotid artery disease     doppler 12/25/2010,  0-39% bilateral  . Ejection fraction     EF 60%, echo, 11/2007  . Clot     ??? apical clot in the past ?? no longer an issue  . Drug therapy     Prednisone.  . Depression   . GERD (gastroesophageal reflux disease)   . Cataract   . Aortic stenosis     Moderately severe, echo, December, 2013  . Rectal bleed 04/06/2012  . Anemia     Past Surgical History  Procedure Laterality Date  . Appendectomy    . Breast surgery    . Coronary angioplasty with stent placement  2003    Clarene Duke RD, LDN Pager [phone] After Hours pager [phone]

## 2012-04-08 ENCOUNTER — Encounter (HOSPITAL_COMMUNITY)
Admission: EM | Disposition: A | Discharge status: Home / Self Care | Payer: Self-pay | Source: Home / Self Care | Attending: Internal Medicine

## 2012-04-08 ENCOUNTER — Encounter (HOSPITAL_COMMUNITY): Payer: Self-pay | Admitting: *Deleted

## 2012-04-08 DIAGNOSIS — C44529 Squamous cell carcinoma of skin of other part of trunk: Secondary | ICD-10-CM

## 2012-04-08 HISTORY — PX: COLONOSCOPY: SHX5424

## 2012-04-08 LAB — CBC
Hemoglobin: 9.8 g/dL — ABNORMAL LOW (ref 12.0–15.0)
Hemoglobin: 9.7 g/dL — ABNORMAL LOW (ref 12.0–15.0)
MCH: 31 pg (ref 26.0–34.0)
MCH: 30.9 pg (ref 26.0–34.0)
MCV: 95.9 fL (ref 78.0–100.0)
Platelets: 170 10*3/uL (ref 150–400)
RBC: 3.16 MIL/uL — ABNORMAL LOW (ref 3.87–5.11)
RBC: 3.14 MIL/uL — ABNORMAL LOW (ref 3.87–5.11)

## 2012-04-08 LAB — BASIC METABOLIC PANEL
CO2: 24 mEq/L (ref 19–32)
Calcium: 9.4 mg/dL (ref 8.4–10.5)
Glucose, Bld: 80 mg/dL (ref 70–99)
Sodium: 142 mEq/L (ref 135–145)

## 2012-04-08 SURGERY — COLONOSCOPY
Anesthesia: Moderate Sedation

## 2012-04-08 MED ORDER — FENTANYL CITRATE 0.05 MG/ML IJ SOLN
INTRAMUSCULAR | Status: DC | PRN
  Administered 2012-04-08 (×3): 25 ug via INTRAVENOUS

## 2012-04-08 MED ORDER — MIDAZOLAM HCL 5 MG/5ML IJ SOLN
INTRAMUSCULAR | Status: DC | PRN
  Administered 2012-04-08 (×3): 1 mg via INTRAVENOUS
  Administered 2012-04-08: 2 mg via INTRAVENOUS

## 2012-04-08 MED ORDER — SODIUM CHLORIDE 0.9 % IV SOLN: INTRAVENOUS | Status: DC

## 2012-04-08 MED ORDER — POLYETHYLENE GLYCOL 3350 17 G PO PACK
17.0000 g | PACK | Freq: Two times a day (BID) | ORAL | Status: DC
  Administered 2012-04-08 – 2012-04-09 (×3): 17 g via ORAL
  Filled 2012-04-08 (×11): qty 1

## 2012-04-08 MED ORDER — FENTANYL CITRATE 0.05 MG/ML IJ SOLN
INTRAMUSCULAR | Status: AC
  Filled 2012-04-08: qty 2

## 2012-04-08 MED ORDER — MIDAZOLAM HCL 5 MG/ML IJ SOLN
INTRAMUSCULAR | Status: AC
  Filled 2012-04-08: qty 2

## 2012-04-08 NOTE — Consult Note (Signed)
Reason for Consult:anorectal mass Referring Physician: Jassmin Kemmerer is an 79 y.o. female.  HPI: Patient was admitted with painless hematochezia. This recurred after admission so she was seen by gastroenterology. She describes a history of hemorrhoids over many years with intermittent bleeding and occasional discomfort. She underwent colonoscopy today by Dr. Randa Evens. She was found to have an anorectal mass concerning for anal squamous cell carcinoma versus rectal adenocarcinoma. Biopsies were taken. She denies pain in that area. She is known to our service status post percutaneous cholecystostomy tube placement for acute cholecystitis. This was done last year due to her significant cardiac history including aortic stenosis and congestive heart failure.  Past Medical History  Diagnosis Date  . CAD (coronary artery disease)     2 vessel intervention 2003  . Dyslipidemia   . CHF (congestive heart failure)     single episode  . Mitral regurgitation     mild prolapse anterior & posterior leaflets  . Aortic valve sclerosis     mild moderate calcification, echo, 2009  . HTN (hypertension)     difficult to obtain BP at times.  [name] Kitchen Urethral trauma     bleeding in hospital  . Rheumatoid arthritis     severe - deforming  . Lung disease, interstitial     related to rheumatoid arthritis, also question  of left apical nodule...followed elsewhere..my understanding stabilized  . Carotid artery disease     doppler 12/25/2010,  0-39% bilateral  . Ejection fraction     EF 60%, echo, 11/2007  . Clot     ??? apical clot in the past ?? no longer an issue  . Drug therapy     Prednisone.  . Depression   . GERD (gastroesophageal reflux disease)   . Cataract   . Aortic stenosis     Moderately severe, echo, December, 2013  . Rectal bleed 04/06/2012  . Anemia     Past Surgical History  Procedure Laterality Date  . Appendectomy    . Breast surgery    . Coronary angioplasty with stent  placement  2003    Family History  Problem Relation Age of Onset  . Heart disease Mother   . Heart disease Father     Social History:  reports that she has never smoked. She has never used smokeless tobacco. She reports that she does not drink alcohol or use illicit drugs.  Allergies: No Known Allergies  Medications:  Scheduled: . amitriptyline  50 mg Oral QHS  . atorvastatin  20 mg Oral QPM  . calcium-vitamin D  1 tablet Oral Daily  . feeding supplement  237 mL Oral BID BM  . furosemide  40 mg Intravenous BID  . leflunomide  20 mg Oral Daily  . metoprolol succinate  100 mg Oral QPM  . pantoprazole (PROTONIX) IV  40 mg Intravenous Q12H  . polyethylene glycol  17 g Oral BID  . predniSONE  5 mg Oral QAC lunch    Results for orders placed during the hospital encounter of 04/06/12 (from the past 48 hour(s))  CBC     Status: Abnormal   Collection Time    04/06/12  6:37 PM      Result Value Range   WBC 7.6  4.0 - 10.5 K/uL   RBC 2.46 (*) 3.87 - 5.11 MIL/uL   Hemoglobin 7.6 (*) 12.0 - 15.0 g/dL   HCT 40.9 (*) 81.1 - 91.4 %   MCV 95.9  78.0 - 100.0 fL  MCH 30.9  26.0 - 34.0 pg   MCHC 32.2  30.0 - 36.0 g/dL   RDW 86.5  78.4 - 69.6 %   Platelets 177  150 - 400 K/uL  PREPARE RBC (CROSSMATCH)     Status: None   Collection Time    04/06/12  7:20 PM      Result Value Range   Order Confirmation ORDER PROCESSED BY BLOOD BANK    CBC     Status: Abnormal   Collection Time    04/07/12 12:11 AM      Result Value Range   WBC 8.2  4.0 - 10.5 K/uL   RBC 3.29 (*) 3.87 - 5.11 MIL/uL   Hemoglobin 10.4 (*) 12.0 - 15.0 g/dL   Comment: DELTA CHECK NOTED     POST TRANSFUSION SPECIMEN     REPEATED TO VERIFY   HCT 31.0 (*) 36.0 - 46.0 %   MCV 94.2  78.0 - 100.0 fL   MCH 31.6  26.0 - 34.0 pg   MCHC 33.5  30.0 - 36.0 g/dL   RDW 29.5  28.4 - 13.2 %   Platelets 229  150 - 400 K/uL  BASIC METABOLIC PANEL     Status: Abnormal   Collection Time    04/07/12  6:30 AM      Result Value Range    Sodium 140  135 - 145 mEq/L   Potassium 3.1 (*) 3.5 - 5.1 mEq/L   Chloride 103  96 - 112 mEq/L   CO2 28  19 - 32 mEq/L   Glucose, Bld 94  70 - 99 mg/dL   BUN 9  6 - 23 mg/dL   Creatinine, Ser 4.40  0.50 - 1.10 mg/dL   Calcium 8.8  8.4 - 10.2 mg/dL   GFR calc non Af Amer 82 (*) >90 mL/min   GFR calc Af Amer >90  >90 mL/min   Comment:            The eGFR has been calculated     using the CKD EPI equation.     This calculation has not been     validated in all clinical     situations.     eGFR's persistently     <90 mL/min signify     possible Chronic Kidney Disease.  CBC     Status: Abnormal   Collection Time    04/07/12  6:30 AM      Result Value Range   WBC 9.6  4.0 - 10.5 K/uL   RBC 3.24 (*) 3.87 - 5.11 MIL/uL   Hemoglobin 10.1 (*) 12.0 - 15.0 g/dL   HCT 72.5 (*) 36.6 - 44.0 %   MCV 93.5  78.0 - 100.0 fL   MCH 31.2  26.0 - 34.0 pg   MCHC 33.3  30.0 - 36.0 g/dL   RDW 34.7  42.5 - 95.6 %   Platelets 227  150 - 400 K/uL  CBC     Status: Abnormal   Collection Time    04/07/12  1:21 PM      Result Value Range   WBC 8.0  4.0 - 10.5 K/uL   RBC 3.03 (*) 3.87 - 5.11 MIL/uL   Hemoglobin 9.4 (*) 12.0 - 15.0 g/dL   HCT 38.7 (*) 56.4 - 33.2 %   MCV 93.4  78.0 - 100.0 fL   MCH 31.0  26.0 - 34.0 pg   MCHC 33.2  30.0 - 36.0 g/dL   RDW 95.1  11.5 - 15.5 %   Platelets 215  150 - 400 K/uL  CBC     Status: Abnormal   Collection Time    04/08/12 12:54 AM      Result Value Range   WBC 8.9  4.0 - 10.5 K/uL   RBC 3.16 (*) 3.87 - 5.11 MIL/uL   Hemoglobin 9.8 (*) 12.0 - 15.0 g/dL   HCT 09.8 (*) 11.9 - 14.7 %   MCV 93.4  78.0 - 100.0 fL   MCH 31.0  26.0 - 34.0 pg   MCHC 33.2  30.0 - 36.0 g/dL   RDW 82.9  56.2 - 13.0 %   Platelets 222  150 - 400 K/uL  BASIC METABOLIC PANEL     Status: Abnormal   Collection Time    04/08/12  6:30 AM      Result Value Range   Sodium 142  135 - 145 mEq/L   Potassium 4.1  3.5 - 5.1 mEq/L   Chloride 105  96 - 112 mEq/L   CO2 24  19 - 32 mEq/L    Glucose, Bld 80  70 - 99 mg/dL   BUN 10  6 - 23 mg/dL   Creatinine, Ser 8.65  0.50 - 1.10 mg/dL   Calcium 9.4  8.4 - 78.4 mg/dL   GFR calc non Af Amer 81 (*) >90 mL/min   GFR calc Af Amer >90  >90 mL/min   Comment:            The eGFR has been calculated     using the CKD EPI equation.     This calculation has not been     validated in all clinical     situations.     eGFR's persistently     <90 mL/min signify     possible Chronic Kidney Disease.  CBC     Status: Abnormal   Collection Time    04/08/12 11:41 AM      Result Value Range   WBC 9.2  4.0 - 10.5 K/uL   RBC 3.14 (*) 3.87 - 5.11 MIL/uL   Hemoglobin 9.7 (*) 12.0 - 15.0 g/dL   HCT 69.6 (*) 29.5 - 28.4 %   MCV 95.9  78.0 - 100.0 fL   MCH 30.9  26.0 - 34.0 pg   MCHC 32.2  30.0 - 36.0 g/dL   RDW 13.2 (*) 44.0 - 10.2 %   Platelets 170  150 - 400 K/uL   Comment: DELTA CHECK NOTED    No results found.  Review of Systems  HENT: Negative.   Eyes: Negative.   Respiratory: Negative.   Cardiovascular: Negative for chest pain.       See HPI   Gastrointestinal: Positive for blood in stool. Negative for nausea, abdominal pain and diarrhea.  Genitourinary: Negative.   Musculoskeletal: Negative.   Skin: Negative.   Neurological: Positive for weakness.  Endo/Heme/Allergies: Negative.    Blood pressure 116/65, pulse 103, temperature 97.2 F (36.2 C), temperature source Oral, resp. rate 21, height 5\' 5"  (1.651 m), weight 44.226 kg (97 lb 8 oz), SpO2 100.00%. Physical Exam  Constitutional: She is oriented to person, place, and time. She appears well-developed and well-nourished. No distress.  HENT:  Head: Normocephalic.  Nose: Nose normal.  Mouth/Throat: Oropharynx is clear and moist. No oropharyngeal exudate.  Eyes: EOM are normal. Pupils are equal, round, and reactive to light. Right eye exhibits no discharge. Left eye exhibits no discharge. No scleral icterus.  Neck: Normal  range of motion. No tracheal deviation present.  No thyromegaly present.  Cardiovascular:  Murmur heard. 3/6 SEM, RRR  Respiratory: Effort normal. No stridor. No respiratory distress. She has no wheezes. She has no rales.  GI: Soft. She exhibits no distension. There is no tenderness. There is no rebound and no guarding.  Circumferential exophytic anal mass 2 cm, no bleeding, picture taken within the EPIC application  Musculoskeletal: She exhibits no tenderness.  Neurological: She is alert and oriented to person, place, and time.  Skin: Skin is warm.  Psychiatric: She has a normal mood and affect.    Assessment/Plan: Anorectal mass likely represents an anal squamous cell carcinoma versus a rectal adenocarcinoma. We'll await biopsies. Patient's significant comorbidities will be taken into account in planning treatments. I spoke at length with her son and daughter as well as her regarding the possible outcomes of the biopsy. I answered their questions. We will follow.  Ankith Edmonston E 04/08/2012, 12:33 PM

## 2012-04-08 NOTE — Progress Notes (Signed)
Pt for colonoscopy this am -stool still slightly muddy-endo aware

## 2012-04-08 NOTE — Op Note (Signed)
[facility] [address] [place], [address]   COLONOSCOPY PROCEDURE REPORT  PATIENT: [name], [name]  MR#: [id number] BIRTHDATE: August 27, 1925 , 86  yrs. old GENDER: Female ENDOSCOPIST: Carman Ching, MD REFERRED BY:   [facility] PROCEDURE DATE:  04/08/2012 PROCEDURE:     Colonoscopy With Biopsy ASA CLASS:   class III INDICATIONS:  Rectal Bleeding MEDICATIONS:    Fentanyl 75 mcg , versed 5 mg IV  DESCRIPTION OF PROCEDURE: Pentax pediatric colonoscope used. Digital exam performed. Patient had marked. Animal mass that was nodular and circumferential around the anus. It was hard and probable with some bleeding. In the opening was stenotic but I was able to introduce my finger in the anus and the past the colonoscope. We were able to advance easily to the cecum. The prep was excellent. Appendices orifice and the ICV were saying. The scope was withdrawn. There was no AVM, diverticulosis, polyp, or tumor throughout the entire colon. In the rectum the scope was retroflex with the finding of a 2 to 3 cm sessile hard nodular mass extending from the anal canal into the distal rectum. This was ulcerated in bleeding. Biopsies were obtained. The scope was withdrawn in the patient tolerated procedure well. The prep was good.     COMPLICATIONS: None  ENDOSCOPIC IMPRESSION: 1. Large anal rectal mass. This is very likely CA. It involves both the distal rectum and the perianal area. It is difficult to know if this is adenocarcinoma or squamous carcinoma. This area is still bleeding and I feel will likely need to be dealt with.  RECOMMENDATIONS: 1. Will obtain surgical consult 2. We'll keep on clear liquids and Miralax for now    _______________________________ eSigned:  Carman Ching, MD 04/08/2012 11:20 AM

## 2012-04-08 NOTE — Interval H&P Note (Signed)
History and Physical Interval Note:  04/08/2012 10:04 AM  [name]  has presented today for surgery, with the diagnosis of G-I BLEED  The various methods of treatment have been discussed with the patient and family. After consideration of risks, benefits and other options for treatment, the patient has consented to  Procedure(s): COLONOSCOPY (N/A) as a surgical intervention .  The patient's history has been reviewed, patient examined, no change in status, stable for surgery.  I have reviewed the patient's chart and labs.  Questions were answered to the patient's satisfaction.     Brentlee Sciara JR,Talia Hoheisel L

## 2012-04-08 NOTE — Progress Notes (Addendum)
Subjective:  Patient is doing well this morning. She had another episode of bloody stool yesterday. Hgb is stable (9.8). I convinced her to have agreed to do colonoscope. No abdominal pain. Denies SOB, chest pain.  Objective: Vital signs in last 24 hours: Filed Vitals:   04/07/12 2111 04/08/12 0543 04/08/12 0839 04/08/12 1005  BP: 118/62 118/69 134/74 161/96  Pulse: 91 88 103   Temp: 98 F (36.7 C) 97.9 F (36.6 C) 98.4 F (36.9 C)   TempSrc: Oral Oral Oral   Resp: 18 18 21    Height:      Weight:  97 lb 8 oz (44.226 kg)    SpO2: 98%  96%    Weight change: -3 lb 11.1 oz (-1.674 kg)  Intake/Output Summary (Last 24 hours) at 04/08/12 1013 Last data filed at 04/08/12 0700  Gross per 24 hour  Intake 274.33 ml  Output   1480 ml  Net -1205.67 ml    Physical Exam Blood pressure 161/96, pulse 103, temperature 98.4 F (36.9 C), temperature source Oral, resp. rate 21, height 5\' 5"  (1.651 m), weight 97 lb 8 oz (44.226 kg), SpO2 96.00%. General:  No acute distress, alert and oriented x 3, pale HEENT:  PERRL, EOMI, moist mucous membranes Cardiovascular:  Regular rate and rhythm, 4/6 SEM Respiratory:  Clear to auscultation bilaterally, no wheezes, rales, or rhonchi Abdomen:  Soft, nondistended, nontender, bowel sounds present Extremities:  Warm and well-perfused, no edema.  Skin: Warm, dry, no rashes Neuro: Not anxious appearing, no depressed mood, normal affect  Lab Results: Basic Metabolic Panel:  Recent Labs Lab 04/07/12 0630 04/08/12 0630  NA 140 142  K 3.1* 4.1  CL 103 105  CO2 28 24  GLUCOSE 94 80  BUN 9 10  CREATININE 0.58 0.61  CALCIUM 8.8 9.4   Liver Function Tests:  Recent Labs Lab 04/06/12 0541  AST 17  ALT 5  ALKPHOS 75  BILITOT 0.2*  PROT 6.1  ALBUMIN 3.2*   CBC:  Recent Labs Lab 04/07/12 1321 04/08/12 0054  WBC 8.0 8.9  HGB 9.4* 9.8*  HCT 28.3* 29.5*  MCV 93.4 93.4  PLT 215 222   Coagulation:  Recent Labs Lab 04/06/12 0910  LABPROT  13.3  INR 1.02   Anemia Panel:  Recent Labs Lab 04/06/12 1212  VITAMINB12 297  FOLATE >20.0  FERRITIN 44  TIBC 241*  IRON 36*  RETICCTPCT 1.7   Medications:  Medications reviewed  Scheduled Meds: . [facility] HOLD] amitriptyline  50 mg Oral QHS  . [facility] HOLD] atorvastatin  20 mg Oral QPM  . [MAR HOLD] calcium-vitamin D  1 tablet Oral Daily  . [facility] HOLD] feeding supplement  237 mL Oral BID BM  . [facility] HOLD] furosemide  40 mg Intravenous BID  . [facility] HOLD] leflunomide  20 mg Oral Daily  . [facility] HOLD] metoprolol succinate  100 mg Oral QPM  . [MAR HOLD] pantoprazole (PROTONIX) IV  40 mg Intravenous Q12H  . [facility] HOLD] predniSONE  5 mg Oral QAC lunch   Continuous Infusions: . sodium chloride 125 mL/hr at 04/08/12 0844  . sodium chloride 20 mL/hr (04/07/12 1710)  . sodium chloride     PRN Meds:.[MAR HOLD] acetaminophen, [MAR HOLD] ondansetron (ZOFRAN) IV, [MAR HOLD] ondansetron  Assessment/Plan:  Addendum:  I was called from Dr. Randa Evens and told that patient was found to have large anal rectal mass which is very likely CA. Dr. Randa Evens consulted surgeon. Dr. Janee Morn evaluated patient and think that anorectal mass  likely represents an anal squamous cell carcinoma versus a rectal adenocarcinoma. Plan is to wait for biopsies results.   -Appreciate GI and surgeon's consult in managing our patient very much. Will follow up recommendation.   Lower GI bleed Bleeding likely from diverticulosis versus hemorrhoids. Possibly Heydes syndrome, though we have no documented AVMs on endoscopy in patient does not have a history of GI bleeding.  Discussed with cardiology yesterday and they recommended outpatient followup and evaluation if patient needs a possible valve replacement. Patient had one bloody bowel movement yesterday. She was transfused one U of blood on 04/07/12. She initially refused colonoscopy, but agreed to do it yesterday. Currently she is hemodynamically stable. Hemoglobin is 9.8 in this  morning. Called GI again yesterday, will do colonoscope today.   -Appreciate GI input in managing our patient -Continue to monitor CBC -Please document and check FOBT on every stool -Transfuse PRBCs as needed  Aortic stenosis Mod-severe AS noted on last ECHO No dizziness Stable, cont to monitor  Anemia Anemia panel with slightly low iron, TIBC, and saturation. -colonoscopy as above -iron replacement therapy at dc  Lymphadenopathy  CT scan from December 2013 showed enlargement of right external iliac and inguinal lymph nodes worrisome for malignancy. It appears that the patient has not undergone further workup. -recommend percutaneous biopsy as outpatient -age appropriate ca screening  Hypokalemia, mild: K 4.1 today.   SCDs for DVT Ppx  Dispo Anticipated discharge in approximately 2-3 day(s).  The patient does not have a current PCP (KARAM,PHILLIP JEROME, MD), therefore will not be requiring OPC follow-up after discharge.  The patient does not have transportation limitations that hinder transportation to clinic appointments.     LOS: 2 days   Lorretta Harp 04/08/2012, 10:13 AM

## 2012-04-09 LAB — BASIC METABOLIC PANEL
CO2: 28 mEq/L (ref 19–32)
Chloride: 96 mEq/L (ref 96–112)
Creatinine, Ser: 0.69 mg/dL (ref 0.50–1.10)
GFR calc Af Amer: >90 mL/min (ref >90)
Sodium: 135 mEq/L (ref 135–145)

## 2012-04-09 LAB — CBC
HCT: 32.1 % — ABNORMAL LOW (ref 36.0–46.0)
HCT: 32.9 % — ABNORMAL LOW (ref 36.0–46.0)
MCH: 30.8 pg (ref 26.0–34.0)
MCHC: 32.7 g/dL (ref 30.0–36.0)
MCV: 94.1 fL (ref 78.0–100.0)
Platelets: 264 10*3/uL (ref 150–400)
RBC: 3.5 MIL/uL — ABNORMAL LOW (ref 3.87–5.11)
RDW: 14.8 % (ref 11.5–15.5)
RDW: 15.2 % (ref 11.5–15.5)
WBC: 9.7 10*3/uL (ref 4.0–10.5)

## 2012-04-09 MED ORDER — HYDROCHLOROTHIAZIDE 12.5 MG PO CAPS
25.0000 mg | ORAL_CAPSULE | Freq: Every day | ORAL | Status: DC
  Administered 2012-04-09 – 2012-04-15 (×7): 25 mg via ORAL
  Filled 2012-04-09 (×7): qty 2

## 2012-04-09 MED ORDER — FUROSEMIDE 10 MG/ML IJ SOLN
40.0000 mg | Freq: Every day | INTRAMUSCULAR | Status: DC
  Administered 2012-04-09: 40 mg via INTRAVENOUS

## 2012-04-09 NOTE — Progress Notes (Signed)
Awaiting pathology  Wilmon Arms. Corliss Skains, MD, [facility]  04/09/2012 11:39 AM

## 2012-04-09 NOTE — Progress Notes (Signed)
Subjective:  Patient is doing well this morning. She did not have new episode of bloody stool since yesterday.  Hgb is stable (10.5). No abdominal pain. Denies SOB, chest pain.  Objective: Vital signs in last 24 hours: Filed Vitals:   04/08/12 1407 04/08/12 1804 04/09/12 0500 04/09/12 1413  BP: 96/58 105/55 124/58 131/68  Pulse: 105 114 90 120  Temp: 97.3 F (36.3 C)  98.2 F (36.8 C) 98.2 F (36.8 C)  TempSrc: Axillary  Oral Oral  Resp: 18  16 18   Height:      Weight:   96 lb 12.5 oz (43.9 kg)   SpO2: 97%  96% 100%   Weight change: -11.5 oz (-0.326 kg)  Intake/Output Summary (Last 24 hours) at 04/09/12 1459 Last data filed at 04/09/12 0858  Gross per 24 hour  Intake   1660 ml  Output   1800 ml  Net   -140 ml    Physical Exam Blood pressure 131/68, pulse 120, temperature 98.2 F (36.8 C), temperature source Oral, resp. rate 18, height 5\' 5"  (1.651 m), weight 96 lb 12.5 oz (43.9 kg), SpO2 100.00%. General:  No acute distress, alert and oriented x 3, pale HEENT:  PERRL, EOMI, moist mucous membranes Cardiovascular:  Regular rate and rhythm, 4/6 SEM Respiratory:  Clear to auscultation bilaterally, no wheezes, rales, or rhonchi Abdomen:  Soft, nondistended, nontender, bowel sounds present Extremities:  Warm and well-perfused, no edema.  Skin: Warm, dry, no rashes Neuro: Not anxious appearing, no depressed mood, normal affect  Lab Results: Basic Metabolic Panel:  Recent Labs Lab 04/08/12 0630 04/09/12 0520  NA 142 135  K 4.1 3.5  CL 105 96  CO2 24 28  GLUCOSE 80 130*  BUN 10 12  CREATININE 0.61 0.69  CALCIUM 9.4 9.7   Liver Function Tests:  Recent Labs Lab 04/06/12 0541  AST 17  ALT 5  ALKPHOS 75  BILITOT 0.2*  PROT 6.1  ALBUMIN 3.2*   CBC:  Recent Labs Lab 04/08/12 1141 04/08/12 2342  WBC 9.2 10.1  HGB 9.7* 10.5*  HCT 30.1* 32.1*  MCV 95.9 94.1  PLT 170 245   Coagulation:  Recent Labs Lab 04/06/12 0910  LABPROT 13.3  INR 1.02    Anemia Panel:  Recent Labs Lab 04/06/12 1212  VITAMINB12 297  FOLATE >20.0  FERRITIN 44  TIBC 241*  IRON 36*  RETICCTPCT 1.7   Medications:  Medications reviewed  Scheduled Meds: . amitriptyline  50 mg Oral QHS  . atorvastatin  20 mg Oral QPM  . calcium-vitamin D  1 tablet Oral Daily  . feeding supplement  237 mL Oral BID BM  . hydrochlorothiazide  25 mg Oral Daily  . leflunomide  20 mg Oral Daily  . metoprolol succinate  100 mg Oral QPM  . pantoprazole (PROTONIX) IV  40 mg Intravenous Q12H  . polyethylene glycol  17 g Oral BID  . predniSONE  5 mg Oral QAC lunch   Continuous Infusions: . sodium chloride 125 mL/hr at 04/08/12 0844  . sodium chloride 20 mL/hr (04/07/12 1710)   PRN Meds:.acetaminophen, ondansetron (ZOFRAN) IV, ondansetron  Assessment/Plan:  Addendum:  I was called from Dr. Randa Evens and told that  Lower GI bleed: patient was found to have large anal rectal mass by colonoscopy which was perfomred by Dr. Randa Evens on 04/08/12. Surgeon was consulted. Dr. Janee Morn evaluated patient and think that anorectal mass likely represents an anal squamous cell carcinoma versus a rectal adenocarcinoma. Plan is to wait for  biopsies results. Hgb is stable. Hgb 10.5 today.   -Appreciate GI and surgeon's consult in managing our patient very much. Will follow up recommendation. -Continue to monitor CBC q12 h -Please document and check FOBT on every stool -Transfuse PRBCs as needed  Aortic stenosis Mod-severe AS noted on last ECHO No dizziness Stable, cont to monitor  Anemia: Anemia panel with slightly low iron, TIBC, and saturation. Hgb is stable  -Monitor cbc q12h -iron replacement therapy at dc  Lymphadenopathy  CT scan from December 2013 showed enlargement of right external iliac and inguinal lymph nodes worrisome for malignancy. It appears that the patient has not undergone further workup. -recommend percutaneous biopsy as outpatient -age appropriate ca  screening  Hypokalemia: K 3.5 today.  HTN:  bp is 124/58 today. -will continue metoprolol -will switch lasix to HCTZ.   SCDs for DVT Ppx  Dispo Anticipated discharge in approximately 2-3 day(s).  The patient does not have a current PCP (KARAM,PHILLIP JEROME, MD), therefore will not be requiring OPC follow-up after discharge.  The patient does not have transportation limitations that hinder transportation to clinic appointments.     LOS: 3 days   Lorretta Harp 04/09/2012, 2:59 PM

## 2012-04-09 NOTE — Progress Notes (Signed)
[facility] PROGRESS NOTE Subjective Pt w/o complaints. Still some BRB but no more after rectal biopsies  Objective: Vital signs in last 24 hours: Temp:  [97.2 F (36.2 C)-98.4 F (36.9 C)] 98.2 F (36.8 C) (03/02 0500) Pulse Rate:  [90-114] 90 (03/02 0500) Resp:  [8-95] 16 (03/02 0500) BP: (68-161)/(28-96) 124/58 mmHg (03/02 0500) SpO2:  [94 %-100 %] 96 % (03/02 0500) Weight:  [43.9 kg (96 lb 12.5 oz)] 43.9 kg (96 lb 12.5 oz) (03/02 0500) Last BM Date: 04/07/12  Intake/Output from previous day: 03/01 0701 - 03/02 0700 In: 1530 [P.O.:1180; I.V.:350] Out: 1800 [Urine:1800] Intake/Output this shift:      Lab Results:  Recent Labs  04/07/12 0630 04/07/12 1321 04/08/12 0054 04/08/12 1141 04/08/12 2342  WBC 9.6 8.0 8.9 9.2 10.1  HGB 10.1* 9.4* 9.8* 9.7* 10.5*  HCT 30.3* 28.3* 29.5* 30.1* 32.1*  PLT 227 215 222 170 245   BMET  Recent Labs  04/07/12 0630 04/08/12 0630 04/09/12 0520  NA 140 142 135  K 3.1* 4.1 3.5  CL 103 105 96  CO2 28 24 28   CREATININE 0.58 0.61 0.69   LFT No results found for this basename: PROT, AST, ALT, ALKPHOS, BILITOT, BILIDIR, IBILI,  in the last 72 hours PT/INR  Recent Labs  04/06/12 0910  LABPROT 13.3  INR 1.02   PANCREAS No results found for this basename: LIPASE,  in the last 72 hours       Studies/Results: No results found.  Medications: I have reviewed the patient's current medications.  Assessment/Plan: 1. Anorectal mass. Probably either squamous CA or rectal CA. Path pending. Will keep on miralax. Surgery on board.   EDWARDS JR,JAMES L 04/09/2012, 8:34 AM

## 2012-04-09 NOTE — Progress Notes (Signed)
1 Day Post-Op  Subjective: Pt feeling okay today, pain in rectum is well controlled.  Pt denies N/V, but appetite is intermittently low.  Pt denies abdominal pain and rectal bleeding this AM.  Pt waiting for biopsy results.  Objective: Vital signs in last 24 hours: Temp:  [97.2 F (36.2 C)-98.4 F (36.9 C)] 98.2 F (36.8 C) (03/02 0500) Pulse Rate:  [90-114] 90 (03/02 0500) Resp:  [8-95] 16 (03/02 0500) BP: (68-161)/(28-96) 124/58 mmHg (03/02 0500) SpO2:  [94 %-100 %] 96 % (03/02 0500) Weight:  [96 lb 12.5 oz (43.9 kg)] 96 lb 12.5 oz (43.9 kg) (03/02 0500) Last BM Date: 04/07/12  Intake/Output from previous day: 03/01 0701 - 03/02 0700 In: 1530 [P.O.:1180; I.V.:350] Out: 1800 [Urine:1800] Intake/Output this shift:    PE: Gen:  Alert, NAD, pleasant Card:  Loud blowing murmur Abd: Soft, NT/ND, +BS, no HSM   Lab Results:   Recent Labs  04/08/12 1141 04/08/12 2342  WBC 9.2 10.1  HGB 9.7* 10.5*  HCT 30.1* 32.1*  PLT 170 245   BMET  Recent Labs  04/08/12 0630 04/09/12 0520  NA 142 135  K 4.1 3.5  CL 105 96  CO2 24 28  GLUCOSE 80 130*  BUN 10 12  CREATININE 0.61 0.69  CALCIUM 9.4 9.7   PT/INR  Recent Labs  04/06/12 0910  LABPROT 13.3  INR 1.02   CMP     Component Value Date/Time   NA 135 04/09/2012 0520   K 3.5 04/09/2012 0520   CL 96 04/09/2012 0520   CO2 28 04/09/2012 0520   GLUCOSE 130* 04/09/2012 0520   BUN 12 04/09/2012 0520   CREATININE 0.69 04/09/2012 0520   CALCIUM 9.7 04/09/2012 0520   PROT 6.1 04/06/2012 0541   ALBUMIN 3.2* 04/06/2012 0541   AST 17 04/06/2012 0541   ALT 5 04/06/2012 0541   ALKPHOS 75 04/06/2012 0541   BILITOT 0.2* 04/06/2012 0541   GFRNONAA 78* 04/09/2012 0520   GFRAA >90 04/09/2012 0520   Lipase     Component Value Date/Time   LIPASE 36 02/02/2012 1630       Studies/Results: No results found.  Anti-infectives: Anti-infectives   None       Assessment/Plan Anorectal mass likely anal squamous cell carcinoma vs rectal  adenocarcinoma - awaiting biopsy results 1.  Low residue diet (low fiber) 2.  Ambulation and IS 3.  Awaiting biopsy results to dictate treatment choices   Nutritional status - cont ensure when appetite is low, may need dietitian consult/counseling and calorie counts    LOS: 3 days    DORT, MEGAN 04/09/2012, 7:44 AM Pager: [phone]

## 2012-04-10 ENCOUNTER — Encounter (HOSPITAL_COMMUNITY): Payer: Self-pay | Admitting: Gastroenterology

## 2012-04-10 DIAGNOSIS — C20 Malignant neoplasm of rectum: Secondary | ICD-10-CM

## 2012-04-10 HISTORY — DX: Malignant neoplasm of rectum: C20

## 2012-04-10 LAB — CBC
HCT: 32.2 % — ABNORMAL LOW (ref 36.0–46.0)
HCT: 32 % — ABNORMAL LOW (ref 36.0–46.0)
Hemoglobin: 10.7 g/dL — ABNORMAL LOW (ref 12.0–15.0)
Hemoglobin: 10.7 g/dL — ABNORMAL LOW (ref 12.0–15.0)
MCH: 31.3 pg (ref 26.0–34.0)
MCH: 31.7 pg (ref 26.0–34.0)
MCHC: 33.4 g/dL (ref 30.0–36.0)
MCV: 94.7 fL (ref 78.0–100.0)
Platelets: 266 K/uL (ref 150–400)
RBC: 3.42 MIL/uL — ABNORMAL LOW (ref 3.87–5.11)
RBC: 3.38 MIL/uL — ABNORMAL LOW (ref 3.87–5.11)
RDW: 15 % (ref 11.5–15.5)
WBC: 12.8 K/uL — ABNORMAL HIGH (ref 4.0–10.5)

## 2012-04-10 LAB — BASIC METABOLIC PANEL WITH GFR
BUN: 16 mg/dL (ref 6–23)
CO2: 30 meq/L (ref 19–32)
Calcium: 9.3 mg/dL (ref 8.4–10.5)
Chloride: 97 meq/L (ref 96–112)
Creatinine, Ser: 0.67 mg/dL (ref 0.50–1.10)
GFR calc Af Amer: >90 mL/min
GFR calc non Af Amer: 79 mL/min — ABNORMAL LOW
Glucose, Bld: 122 mg/dL — ABNORMAL HIGH (ref 70–99)
Potassium: 3 meq/L — ABNORMAL LOW (ref 3.5–5.1)
Sodium: 139 meq/L (ref 135–145)

## 2012-04-10 LAB — TYPE AND SCREEN
ABO/RH(D): A POS
Antibody Screen: NEGATIVE
Unit division: 0

## 2012-04-10 MED ORDER — POTASSIUM CHLORIDE CRYS ER 10 MEQ PO TBCR
15.0000 meq | EXTENDED_RELEASE_TABLET | Freq: Three times a day (TID) | ORAL | Status: DC
  Administered 2012-04-10 (×2): 15 meq via ORAL
  Filled 2012-04-10 (×5): qty 2

## 2012-04-10 MED ORDER — ENSURE COMPLETE PO LIQD: 237.0000 mL | Freq: Two times a day (BID) | ORAL | Status: DC | PRN

## 2012-04-10 NOTE — Progress Notes (Signed)
Internal Medicine Teaching Service Attending Note Date: 04/10/2012  Patient name: [name]  Medical record number: [id number]  Date of birth: [birth year]    This patient has been seen and discussed with the house staff. Please see their note for complete details. I concur with their findings with the following additions/corrections: Patient without any complaints at this time. We are awaiting path results from biopsy to decide further course of treatment.  Lars Mage 04/10/2012, 1:56 PM

## 2012-04-10 NOTE — Progress Notes (Signed)
Subjective:  No overnight events, she denies fevers, chills, no dysuria or fever. She does not report bloody stools. She has no vomiting or abdominal pain.   Objective: Vital signs in last 24 hours: Filed Vitals:   04/09/12 2106 04/10/12 0500 04/10/12 1018 04/10/12 1434  BP: 111/66 114/68 132/67 119/67  Pulse: 103 87 101 109  Temp: 98.1 F (36.7 C) 97.3 F (36.3 C) 98.1 F (36.7 C) 97.8 F (36.6 C)  TempSrc: Oral Oral Oral Oral  Resp: 16 16 18 20   Height:      Weight:  91 lb 2.9 oz (41.36 kg)    SpO2: 97% 96% 98% 98%   Weight change: -5 lb 9.6 oz (-2.54 kg)  Intake/Output Summary (Last 24 hours) at 04/10/12 1509 Last data filed at 04/10/12 0848  Gross per 24 hour  Intake    598 ml  Output      0 ml  Net    598 ml    Physical Exam Blood pressure 119/67, pulse 109, temperature 97.8 F (36.6 C), temperature source Oral, resp. rate 20, height 5\' 5"  (1.651 m), weight 91 lb 2.9 oz (41.36 kg), SpO2 98.00%. General:  No acute distress, alert and oriented x 3, pale HEENT:  PERRL, EOMI, moist mucous membranes Cardiovascular:  Regular rate and rhythm, 4/6 SEM Respiratory:  Clear to auscultation bilaterally, no wheezes, rales, or rhonchi Abdomen:  Soft, nondistended, nontender, bowel sounds present Extremities:  Warm and well-perfused, no edema.  Skin: Warm, dry, no rashes Neuro: Not anxious appearing, no depressed mood, normal affect  Lab Results: Basic Metabolic Panel:  Recent Labs Lab 04/09/12 0520 04/10/12 0159  NA 135 139  K 3.5 3.0*  CL 96 97  CO2 28 30  GLUCOSE 130* 122*  BUN 12 16  CREATININE 0.69 0.67  CALCIUM 9.7 9.3   Liver Function Tests:  Recent Labs Lab 04/06/12 0541  AST 17  ALT 5  ALKPHOS 75  BILITOT 0.2*  PROT 6.1  ALBUMIN 3.2*   CBC:  Recent Labs Lab 04/09/12 1636 04/10/12 0159  WBC 9.7 11.6*  HGB 11.0* 10.7*  HCT 32.9* 32.2*  MCV 94.0 94.2  PLT 264 275   Coagulation:  Recent Labs Lab 04/06/12 0910  LABPROT 13.3  INR 1.02    Anemia Panel:  Recent Labs Lab 04/06/12 1212  VITAMINB12 297  FOLATE >20.0  FERRITIN 44  TIBC 241*  IRON 36*  RETICCTPCT 1.7   Medications:  Medications reviewed  Scheduled Meds: . amitriptyline  50 mg Oral QHS  . atorvastatin  20 mg Oral QPM  . calcium-vitamin D  1 tablet Oral Daily  . hydrochlorothiazide  25 mg Oral Daily  . leflunomide  20 mg Oral Daily  . metoprolol succinate  100 mg Oral QPM  . pantoprazole (PROTONIX) IV  40 mg Intravenous Q12H  . polyethylene glycol  17 g Oral BID  . predniSONE  5 mg Oral QAC lunch   Continuous Infusions: . sodium chloride 125 mL/hr at 04/08/12 0844  . sodium chloride 20 mL/hr (04/07/12 1710)   PRN Meds:.acetaminophen, feeding supplement, ondansetron (ZOFRAN) IV, ondansetron  Assessment/Plan:  79 yr old woman admitted with painless bright red blood per rectum and s/p biopsy on 04/08/2012.    Lower GI bleed: She presented with a large anal rectal mass by colonoscopy which was perfomred by Dr. Randa Evens on 04/08/12. Surgeon was consulted. Dr. Janee Morn evaluated patient and think that anorectal mass likely represents an anal squamous cell carcinoma versus a rectal adenocarcinoma.  Plan is to wait for biopsies results. Hgb is stable at 10.7.   Plan -Appreciate GI and surgery's input  - Will follow up recommendation. -will monitor CBGs daily since Hb is stable  - check and document FOBT on every stool -Transfuse PRBCs as needed. So far Hb is stable.  Leukocytosis: Her white blood cell count increased from 9.7 on 3/2 to 11.6 this morning. She denies any fevers or chills. However, her pulse is also slightly elevated from 87 yesterday to 109. There is no clear source of infection. She denies chest pain, she denies dysuria. I suspect that this might be related to her recent biopsy. Unlikely to be related to steroids since she has been chronically taking these.  Plan  - No indication for antibiotic yet since patient has not fever - Will  monitor clinically   Aortic stenosis Mod-severe AS noted on last ECHO No dizziness Stable, cont to monitor  Rheumatoid arthritis: This is stable. She will continue with Leflunomide and prednisolone. A stress steroid dose will be required in case of surgical procedures.   Anemia: Anemia panel with slightly low iron, TIBC, and saturation. Hgb is stable Plan  -Monitor cbc q daily  -iron replacement therapy at dc  Lymphadenopathy  CT scan from December 2013 showed enlargement of right external iliac and inguinal lymph nodes worrisome for malignancy. It appears that the patient has not undergone further workup. -recommend percutaneous biopsy as outpatient -age appropriate ca screening  Hypokalemia: K level reduced from 3.5 to 3.0 today.  Most likely due recent HCTZ with renal damping of potassium.  Plan  - Oral k-dur  HTN:  bp is 124/58 today. -will continue metoprolol -continue with HCTZ.   SCDs for DVT Ppx  Dispo Anticipated discharge in approximately 2-3 day(s).  The patient does not have a current PCP (KARAM,PHILLIP JEROME, MD), therefore will not be requiring OPC follow-up after discharge.  The patient does not have transportation limitations that hinder transportation to clinic appointments.     LOS: 4 days   Dow Adolph 04/10/2012, 3:09 PM

## 2012-04-10 NOTE — Progress Notes (Signed)
Utilization Review Completed Camellia J. Wood, RN, BSN, NCM [phone]  

## 2012-04-10 NOTE — Progress Notes (Signed)
Patient ID: [name], female   DOB: 1925-08-24, 79 y.o.   MRN: [id number] 2 Days Post-Op  Subjective: No bleeding or BMs in 2 days.  Some anal discomfort. A little nausea this AM  Objective: Vital signs in last 24 hours: Temp:  [97.3 F (36.3 C)-98.2 F (36.8 C)] 97.3 F (36.3 C) (03/03 0500) Pulse Rate:  [87-120] 87 (03/03 0500) Resp:  [16-18] 16 (03/03 0500) BP: (111-131)/(66-68) 114/68 mmHg (03/03 0500) SpO2:  [96 %-100 %] 96 % (03/03 0500) Weight:  [91 lb 2.9 oz (41.36 kg)] 91 lb 2.9 oz (41.36 kg) (03/03 0500) Last BM Date: 04/07/12  Intake/Output from previous day: 03/02 0701 - 03/03 0700 In: 838 [P.O.:838] Out: -  Intake/Output this shift:    General appearance: Very thin, pale, elderly female , no acute distress GI: exopytic mass at anus without bleeding  Lab Results:   Recent Labs  04/09/12 1636 04/10/12 0159  WBC 9.7 11.6*  HGB 11.0* 10.7*  HCT 32.9* 32.2*  PLT 264 275   BMET  Recent Labs  04/09/12 0520 04/10/12 0159  NA 135 139  K 3.5 3.0*  CL 96 97  CO2 28 30  GLUCOSE 130* 122*  BUN 12 16  CREATININE 0.69 0.67  CALCIUM 9.7 9.3     Studies/Results: No results found.  Anti-infectives: Anti-infectives   None      Assessment/Plan: Anorectal mass, path pending.  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

## 2012-04-10 NOTE — Care Management Note (Unsigned)
    Page 1 of 1   04/10/2012     2:06:37 PM   CARE MANAGEMENT NOTE 04/10/2012  Patient:  [name], [name]   Account Number:  [id number]  Date Initiated:  04/10/2012  Documentation initiated by:  [facility]  Subjective/Objective Assessment:   79 yo female admitted with several episodes of bright red blood per rectum.     Action/Plan:   Home with son   Anticipated DC Date:  04/13/2012   Anticipated DC Plan:  HOME/SELF CARE         Choice offered to / List presented to:             Status of service:   Medicare Important Message given?   (If response is "NO", the following Medicare IM given date fields will be blank) Date Medicare IM given:   Date Additional Medicare IM given:    Discharge Disposition:    Per UR Regulation:    If discussed at Long Length of Stay Meetings, dates discussed:    Comments:

## 2012-04-10 NOTE — Progress Notes (Addendum)
NUTRITION CONSULT/FOLLOW UP  Intervention:    Ensure Complete supplement twice daily PRN (350 kcals, 13 gm protein per 8 fl oz bottle) RD to follow for nutrition care plan  Nutrition Dx:   Inadequate oral intake related to poor appetite as evidenced by PO intake 0-65%, ongoing  Goal:   Oral intake with meals & supplements to meet >/= 90% of estimated nutrition needs, unmet  Monitor:   PO & supplemental intake, weight, labs, I/O's  Assessment:   RD consult for poor PO intake.  Initial nutrition assessment completed 2/28.    Patient reports her appetite "isn't very good".  PO intake variable at 0-65% per flowsheet records. Reports she had an Ensure supplement yesterday, however, didn't feel well after drinking it.    RD offered Resource Breeze supplement ---> patient declined.  Awaiting biopsy results for anorectal mass.   Height: Ht Readings from Last 1 Encounters:  04/06/12 5\' 5"  (1.651 m)    Weight Status:   Wt Readings from Last 1 Encounters:  04/10/12 91 lb 2.9 oz (41.36 kg)    Re-estimated needs:  Kcal: 1200-1400 Protein: 50-60 gm Fluid: > 1.5 L  Skin: Intact  Diet Order: Fiber Restricted   Intake/Output Summary (Last 24 hours) at 04/10/12 1044 Last data filed at 04/10/12 0848  Gross per 24 hour  Intake    598 ml  Output      0 ml  Net    598 ml    Last BM: 3/1  Labs:   Recent Labs Lab 04/08/12 0630 04/09/12 0520 04/10/12 0159  NA 142 135 139  K 4.1 3.5 3.0*  CL 105 96 97  CO2 24 28 30   BUN 10 12 16   CREATININE 0.61 0.69 0.67  CALCIUM 9.4 9.7 9.3  GLUCOSE 80 130* 122*    Scheduled Meds: . amitriptyline  50 mg Oral QHS  . atorvastatin  20 mg Oral QPM  . calcium-vitamin D  1 tablet Oral Daily  . feeding supplement  237 mL Oral BID BM  . hydrochlorothiazide  25 mg Oral Daily  . leflunomide  20 mg Oral Daily  . metoprolol succinate  100 mg Oral QPM  . pantoprazole (PROTONIX) IV  40 mg Intravenous Q12H  . polyethylene glycol  17 g Oral  BID  . predniSONE  5 mg Oral QAC lunch    Continuous Infusions: . sodium chloride 125 mL/hr at 04/08/12 0844  . sodium chloride 20 mL/hr (04/07/12 1710)    Maureen Chatters, RD, LDN Pager #: [phone] After-Hours Pager #: [phone]

## 2012-04-11 ENCOUNTER — Ambulatory Visit
Admit: 2012-04-11 | Discharge: 2012-04-11 | Disposition: A | Discharge status: Home / Self Care | Payer: Medicare Other | Attending: Radiation Oncology | Admitting: Radiation Oncology

## 2012-04-11 DIAGNOSIS — C210 Malignant neoplasm of anus, unspecified: Secondary | ICD-10-CM

## 2012-04-11 LAB — CBC
HCT: 32.4 % — ABNORMAL LOW (ref 36.0–46.0)
MCH: 30.7 pg (ref 26.0–34.0)
MCHC: 32.7 g/dL (ref 30.0–36.0)
MCV: 93.9 fL (ref 78.0–100.0)
RDW: 14.8 % (ref 11.5–15.5)

## 2012-04-11 MED ORDER — POTASSIUM CHLORIDE 20 MEQ/15ML (10%) PO LIQD
15.0000 meq | Freq: Three times a day (TID) | ORAL | Status: DC
  Administered 2012-04-11 (×3): 15 meq via ORAL
  Filled 2012-04-11 (×6): qty 15

## 2012-04-11 MED ORDER — HYDROCODONE-ACETAMINOPHEN 5-325 MG PO TABS: 1.0000 | ORAL_TABLET | ORAL | Status: DC | PRN

## 2012-04-11 NOTE — Progress Notes (Signed)
Internal Medicine Teaching Service Attending Note Date: 04/11/2012  Patient name: [name]  Medical record number: [id number]  Date of birth: [birth year]    This patient has been seen and discussed with the house staff. Please see their note for complete details. I concur with their findings with the following additions/corrections: We are awaiting biopsy results at this time to make further recommendations regarding management. Patient denies any bloody bowel movements. We will consult cardiology to have them on board while to decide whether this patient is a surgical candidate given her moderately severe aortic stenosis or not.  Lars Mage 04/11/2012, 1:03 PM

## 2012-04-11 NOTE — Clinical Documentation Improvement (Signed)
Anemia Blood Loss Clarification   CLINICAL DOCUMENTATION QUERIES ARE NOT PART OF THE PERMANENT MEDICAL RECORD   04/11/12   Dr. Zada Girt and/or Associates,   In an effort to better capture your patient's severity of illness, reflect appropriate length of stay and utilization of resources, a review of the patient medical record has revealed the following indicators:   Patient admitted with Lower GI Bleed, now diagnosed with Anal Squamous Cell Carcinoma   Patient transfused 1 unit of PRBC's so far this admission   H&H trend this admission:  Component Latest Ref Rng  04/06/2012  04/06/2012  04/06/2012  04/07/2012  04/07/2012     5:41 AM  12:12 PM  6:37 PM  12:11 AM  6:30 AM   Hemoglobin 12.0 - 15.0 g/dL  9.2 (L)  8.3 (L)  7.6 (L)  10.4 (L)  10.1 (L)   HCT 36.0 - 46.0 %  28.4 (L)  25.3 (L)  23.6 (L)  31.0 (L)  30.3 (L)   Platelets 150 - 400 K/uL  204  197  177  229  227    04/07/2012   1:21 PM   9.4 (L)   28.3 (L)   215     Based on your clinical judgment, please document in the progress notes and discharge summary if a condition below provides greater specificity regarding the patient's H&H trend and treatment provided:   - Acute Blood Loss Anemia 2/2 Anal Squamous Cell Carcinoma  - Other Type of Anemia   - Unable to Clinically Determine    In responding to this query please exercise your independent judgment.   The fact that a query is asked, does not imply that any particular answer is desired or expected.     Reviewed: 04/12/12 - abla documented in pn 04/12/12 by dr. Zada Girt.  Mathis Dad RN  Thank You,  Jerral Ralph  RN BSN CCDS Certified Clinical Documentation Specialist: Cell   [phone]  Health Information Management [place]

## 2012-04-11 NOTE — Progress Notes (Addendum)
Subjective:  No overnight events, she denies fevers, chills, no dysuria or fever. She does not report bloody stools. She has no vomiting or abdominal pain.   Objective: Vital signs in last 24 hours: Filed Vitals:   04/10/12 1540 04/10/12 1808 04/10/12 2042 04/11/12 0536  BP:  104/51 140/69 121/59  Pulse:  118 108 97  Temp:   97.6 F (36.4 C) 97.4 F (36.3 C)  TempSrc:   Oral Oral  Resp:   18 19  Height:      Weight: 91 lb 11.4 oz (41.6 kg)   91 lb 9.6 oz (41.549 kg)  SpO2:   98% 97%   Weight change: 8.5 oz (0.24 kg)  Intake/Output Summary (Last 24 hours) at 04/11/12 1450 Last data filed at 04/11/12 1247  Gross per 24 hour  Intake   1020 ml  Output    550 ml  Net    470 ml    Physical Exam Blood pressure 121/59, pulse 97, temperature 97.4 F (36.3 C), temperature source Oral, resp. rate 19, height 5\' 5"  (1.651 m), weight 91 lb 9.6 oz (41.549 kg), SpO2 97.00%. General:  No acute distress, alert and oriented x 3, pale HEENT:  PERRL, EOMI, moist mucous membranes Cardiovascular:  Regular rate and rhythm, 4/6 SEM Respiratory:  Clear to auscultation bilaterally, no wheezes, rales, or rhonchi Abdomen:  Soft, nondistended, nontender, bowel sounds present Extremities:  Warm and well-perfused, no edema.  Skin: Warm, dry, no rashes Neuro: Not anxious appearing, no depressed mood, normal affect  Lab Results: Basic Metabolic Panel:  Recent Labs Lab 04/09/12 0520 04/10/12 0159  NA 135 139  K 3.5 3.0*  CL 96 97  CO2 28 30  GLUCOSE 130* 122*  BUN 12 16  CREATININE 0.69 0.67  CALCIUM 9.7 9.3   Liver Function Tests:  Recent Labs Lab 04/06/12 0541  AST 17  ALT 5  ALKPHOS 75  BILITOT 0.2*  PROT 6.1  ALBUMIN 3.2*   CBC:  Recent Labs Lab 04/10/12 1616 04/11/12 0640  WBC 12.8* 11.7*  HGB 10.7* 10.6*  HCT 32.0* 32.4*  MCV 94.7 93.9  PLT 266 257   Coagulation:  Recent Labs Lab 04/06/12 0910  LABPROT 13.3  INR 1.02   Anemia Panel:  Recent Labs Lab  04/06/12 1212  VITAMINB12 297  FOLATE >20.0  FERRITIN 44  TIBC 241*  IRON 36*  RETICCTPCT 1.7   Medications:  Medications reviewed  Scheduled Meds: . amitriptyline  50 mg Oral QHS  . atorvastatin  20 mg Oral QPM  . calcium-vitamin D  1 tablet Oral Daily  . hydrochlorothiazide  25 mg Oral Daily  . leflunomide  20 mg Oral Daily  . metoprolol succinate  100 mg Oral QPM  . pantoprazole (PROTONIX) IV  40 mg Intravenous Q12H  . polyethylene glycol  17 g Oral BID  . potassium chloride  15 mEq Oral TID  . predniSONE  5 mg Oral QAC lunch   Continuous Infusions: . sodium chloride 125 mL/hr at 04/08/12 0844  . sodium chloride 20 mL/hr (04/07/12 1710)   PRN Meds:.acetaminophen, feeding supplement, ondansetron (ZOFRAN) IV, ondansetron  Assessment/Plan:  79 yr old woman admitted with painless bright red blood per rectum and s/p biopsy on 04/09/2012 with results today showing anal squamous cell carcinoma.    Anal Squamous Cell carcinoma: She presented with a large anal rectal mass by colonoscopy which was perfomred by Dr. Randa Evens on 04/08/12 and pathology confirms squamous cell carcinoma. She is otherwise clinically stable  and Hgb is stable at 10 -11. No active bleeding currently. Palliative care, medical oncology and radiotherapy have consulted. Patient and son, Russella Dar have been informed of the diagnosis and their questions answered to my best.   Plan -awaiting Palliative care, oncology and radiotherapy evaluation - will monitor CBGs daily since Hb is stable  - check and document FOBT on every stool.  - Patient reported some blood in her urine - we will check U/A -Transfuse PRBCs as needed. So far Hb is stable.  Leukocytosis: Her white blood cell count increased from 9.7 on 3/2 to 11.6 and has remained high. She denies any fevers or chills. However, her pulse is also slightly elevated from 87 yesterday to 109. There is no clear source of infection. She denies chest pain, she denies dysuria.  I suspect that this might be related to her recent biopsy. Unlikely to be related to steroids since she has been chronically taking these.  Plan  - No indication for antibiotic yet since patient has not fever - Will monitor clinically   Aortic stenosis Mod-severe AS noted on last ECHO No dizziness Stable, cont to monitor  Rheumatoid arthritis: This is stable. She will continue with Leflunomide and prednisolone. A stress steroid dose will be required in case of surgical procedures.  Acute Blood loss Anemia: Anemia panel with slightly low iron, TIBC, and saturation. Hgb is stable Plan  -Monitor cbc q daily  -iron replacement therapy at dc  Lymphadenopathy  CT scan from December 2013 showed enlargement of right external iliac and inguinal lymph nodes worrisome for malignancy. It appears that the patient has not undergone further workup. She will need further evaluation as recommended by oncology.  Plan -recommend percutaneous biopsy as outpatient -age appropriate ca screening  Hypokalemia: K level reduced from 3.5 to 3.0 today.  Most likely due recent HCTZ with renal damping of potassium.  Plan  - Oral k-dur  Underweight: BMI 16.9: She has low PO intake. I believe her PO intake is related to the anal SCC. Nutritional consult encouraged ensure with 350 Kcal and 13 gram of protein. Albumin level is slightly low at 3.2. She is still having low intake. Plan  - continue with ensure nutritional supplements - RD to continue to follow   HTN:  bp is 124/58 today. -will continue metoprolol -continue with HCTZ.   SCDs for DVT Ppx  Dispo Anticipated discharge in approximately 2-3 day(s).  The patient does not have a current PCP (KARAM,PHILLIP JEROME, MD), therefore will not be requiring OPC follow-up after discharge.  The patient does not have transportation limitations that hinder transportation to clinic appointments.     LOS: 5 days   Dow Adolph 04/11/2012, 2:50 PM

## 2012-04-11 NOTE — Plan of Care (Signed)
Problem: Phase II Progression Outcomes Goal: No active bleeding Outcome: Completed/Met Date Met:  04/11/12 Pt has not had any active bleeding, goal met, will continue to monitor

## 2012-04-11 NOTE — Progress Notes (Signed)
Late entry:  Thank you for consulting the Palliative Medicine Team at [facility] to meet your patient's and family's needs.   The reason that you asked [place] to see your patient is for goals of care discussion. Spoke at bedside with patient and son [name]; they informed they are open to talking with PMT and are looking forward to speaking with Oncologist.   We have scheduled your patient for the first available meeting time: Thursday, 04/13/12 @ 8:30 am   The Surrogate decision make IO:[id number] son Margie Billet information: [phone]; [phone]    Valente David, RN 04/11/2012, 8:10 PM Palliative Medicine Team RN Liaison [phone]

## 2012-04-11 NOTE — Progress Notes (Signed)
Radiation Oncology         [phone]) [phone] ________________________________  Initial inpatient Consultation - Date: 04/06/2012   Name: [name] MRN: [id number]   DOB: 03-24-1925  REFERRING PHYSICIAN: Teaching service Lars Mage, MD attending)  DIAGNOSIS: Locally advanced anal cancer  HISTORY OF PRESENT ILLNESS::[name] is a 79 y.o. female with multiple medical problems listed below who was admitted on 04/08/2012 with painless hematochezia after her son had witnessed the event. She does have a history of hemorrhoids at least dating to 40 years, lately a combining by increased bleeding and discomfort. On admission,after Hemoccult returned positive, a colonoscopy was performed on 2/27 /2014 by Dr. Randa Evens. An anorectal mass was seen, suspicious for cancer measuring 2-3 centimeters which was noted to be ulcerated and bleeding.  Biopsies were taken and positive for squamous cell carcinoma. Patient states that a prior colonoscopy about 10 years prior, was negative for any suspicious findings. Her H&H on admission was 9.2 and 28.4 respectively, today at 10.5 and 32.1.  Staging studies have not been performed.  She has not had bleeding since being in the hospital.  She denies any abdominal pain, nausea, vomiting or diarrhea.  She denies any difficulty passing bowel movements. She does have a history of decreased appetite and a 10 pound weight loss over the past 2 weeks. She was recently admitted to the hospital in December for acute cholecystitis. She is not felt to be an operative candidate and instead a percutaneous drain was placed. This was recently exchanged in February. She also has a history of a right inguinal mass  and right external iliac adenopathy measuring 4.1 x 2.5 and 4.6 x 2.4 cm respectively on a scan in December of last year. The right inguinal mass has been biopsied today and pathology for that is pending. She has been evaluated by medical oncology and palliative medicine. Her  granddaughter is present in the room today. The patient would really just like to go home she states.   PREVIOUS RADIATION THERAPY: No  PAST MEDICAL HISTORY:  has a past medical history of CAD (coronary artery disease); Dyslipidemia; CHF (congestive heart failure); Mitral regurgitation; Aortic valve sclerosis; HTN (hypertension); Urethral trauma; Rheumatoid arthritis; Lung disease, interstitial; Carotid artery disease; Ejection fraction; Clot; Drug therapy; Depression; GERD (gastroesophageal reflux disease); Cataract; Aortic stenosis; Rectal bleed (04/06/2012); and Anemia.    PAST SURGICAL HISTORY: Past Surgical History  Procedure Laterality Date  . Appendectomy    . Breast surgery    . Coronary angioplasty with stent placement  2003  . Colonoscopy N/A 04/08/2012    Procedure: COLONOSCOPY;  Surgeon: Vertell Novak., MD;  Location: [facility] ENDOSCOPY;  Service: Endoscopy;  Laterality: N/A;    SOCIAL HISTORY:  History  Substance Use Topics  . Smoking status: Never Smoker   . Smokeless tobacco: Never Used  . Alcohol Use: No    ALLERGIES: Review of patient's allergies indicates no known allergies.  MEDICATIONS:  Current Facility-Administered Medications  Medication Dose Route Frequency Provider Last Rate Last Dose  . 0.9 %  sodium chloride infusion   Intravenous Continuous Sunnie Nielsen, MD 125 mL/hr at 04/08/12 0844    . 0.9 %  sodium chloride infusion   Intravenous Continuous Shirley Friar, MD 20 mL/hr at 04/07/12 1710 20 mL/hr at 04/07/12 1710  . acetaminophen (TYLENOL) tablet 1,000 mg  1,000 mg Oral Q6H PRN Sunday Spillers, MD   1,000 mg at 04/09/12 0842  . amitriptyline (ELAVIL) tablet 50 mg  50 mg Oral QHS Sunday Spillers, MD   50 mg at 04/10/12 2131  . atorvastatin (LIPITOR) tablet 20 mg  20 mg Oral QPM Sunday Spillers, MD   20 mg at 04/11/12 1709  . calcium-vitamin D (OSCAL WITH D) 500-200 MG-UNIT per tablet 1 tablet  1 tablet Oral Daily Sunday Spillers, MD   1 tablet at 04/11/12 1022  .  feeding supplement (ENSURE COMPLETE) liquid 237 mL  237 mL Oral BID BM PRN Ailene Ards, RD      . hydrochlorothiazide (MICROZIDE) capsule 25 mg  25 mg Oral Daily Lorretta Harp, MD   25 mg at 04/11/12 1023  . leflunomide (ARAVA) tablet 20 mg  20 mg Oral Daily Sunday Spillers, MD   20 mg at 04/11/12 1024  . metoprolol succinate (TOPROL-XL) 24 hr tablet 100 mg  100 mg Oral QPM Sunday Spillers, MD   100 mg at 04/11/12 1709  . ondansetron (ZOFRAN) tablet 4 mg  4 mg Oral Q6H PRN Sunday Spillers, MD       Or  . ondansetron [facility]) injection 4 mg  4 mg Intravenous Q6H PRN Sunday Spillers, MD      . pantoprazole (PROTONIX) injection 40 mg  40 mg Intravenous Q12H Sunday Spillers, MD   40 mg at 04/11/12 1023  . polyethylene glycol (MIRALAX / GLYCOLAX) packet 17 g  17 g Oral BID Vertell Novak., MD   17 g at 04/09/12 2123  . potassium chloride 20 MEQ/15ML (10%) liquid 15 mEq  15 mEq Oral TID Marlane Mingle Hiatt, PHARMD   15 mEq at 04/11/12 1709  . predniSONE (DELTASONE) tablet 5 mg  5 mg Oral QAC lunch Sunday Spillers, MD   5 mg at 04/11/12 1138    REVIEW OF SYSTEMS:  A 15 point review of systems is documented in the electronic medical record. This was obtained by the nursing staff. However, I reviewed this with the patient to discuss relevant findings and make appropriate changes.  Pertinent items are noted in HPI.   PHYSICAL EXAM:  Filed Vitals:   04/11/12 1454  BP:   Pulse: 94  Temp: 97.6 F (36.4 C)  Resp:   .  She is an elderly frail female lying in a hospital bed. She is alert and oriented x3. She declined rectal examination.  LABORATORY DATA:  Lab Results  Component Value Date   WBC 11.7* 04/11/2012   HGB 10.6* 04/11/2012   HCT 32.4* 04/11/2012   MCV 93.9 04/11/2012   PLT 257 04/11/2012   Lab Results  Component Value Date   NA 139 04/10/2012   K 3.0* 04/10/2012   CL 97 04/10/2012   CO2 30 04/10/2012   Lab Results  Component Value Date   ALT 5 04/06/2012   AST 17 04/06/2012   ALKPHOS 75 04/06/2012   BILITOT  0.2* 04/06/2012     RADIOGRAPHY: Ir Cholan Exist Tube  03/15/2012  *RADIOLOGY REPORT*  Clinical Data: History of acute calculus cholecystitis, post ultrasound and fluoroscopic-guided cholecystostomy tube placement - 02/05/2012  CHOLANGIOGRAM VIA EXISTING CATHETER  Comparison: Ultrasound fluoroscopic-guided cholecystostomy tube placement - 02/05/2012; head scan dash 02/03/2012; abdominal CT - 02/02/2012; abdominal ultrasound - 02/02/2012  Intravenous medications:  None.  Fluoro time:  8.6 minutes  Complications: During attempted catheter exchange, percutaneous access of the gallbladder was lost and despite prolonged efforts, the track cannot be recanalized.  This was discussed with referring surgeon, Dr. Ezzard Standing,  and  the decision was made not to attempt a repeat percutaneous placement of the cholecystostomy tube secondary to patency of the cystic and common bile ducts.  Technique:  The external portion of the existing cholecystostomy tube as well as a surrounding skin was prepped and draped usual sterile fashion. A timeout was performed prior to initiation of the procedure.  A preprocedural spot fluoroscopic image of the existing cholecystostomy tube was obtained. Multiple spot fluoroscopic radiographic images were obtained in various obliquities after injection of a small amount of contrast via the existing cholecystostomy catheter  The external portion of cholecystostomy tube was cut and cannulated with a Benson wire, however the wire was unable to be advanced throughout the entirety of the cholecystostomy tube secondary to internal debris at the catheter tip.  As such, a stiff Glidewire was attempted to be advanced through the cholecystostomy tube however this resulted in slight was withdrawal of the cholecystostomy tube secondary to redundant tubing outside of the gallbladder.  Despite the use of an additional regular glide wire, percutaneous access to the gallbladder was lost.  Attempts were made to  recanalized the prior cholecystostomy tube track however this proved unsuccessful, again secondary to marked redundancy with the catheter tubing outside of the gallbladder fossa.  Above findings discussed with referring physician, Dr. Ezzard Standing, and the decision was made to not to attempt to perform repeat percutaneous access. As such, the procedure was terminated.  A dressing was placed at the prior cholecystostomy tube site. The patient tolerated the procedure well without immediate postprocedural complication  Findings:  The existing cholecystostomy tube is appropriately positioned and functioning within the decompressed gallbladder, however note is made of markedly redundant tubing outside of the gallbladder fossa.  Contrast injection demonstrates grossly unchanged appearance of large gallstone within the fundus of the now decompressed gallbladder.  The cystic and common bile ducts are widely patent. There is minimal reflux of contrast into the proper hepatic and central aspect of the nondilated intrahepatic biliary tree. There are no discrete filling defects within the biliary tree to suggest the presence of choledocholithiasis.  As stated above, during attempted catheter exchange, access to the decompressed gallbladder was lost and despite efforts, the track could not be recanalized.  The patency of the cystic and common bile ducts were discussed with the referring surgeon, Dr. Ezzard Standing, and the decision was made not to attempt to repeat percutaneous placement at this time.  IMPRESSION:  1.  The existing cholecystostomy tube is appropriately positioned and functioning, however there is noted to be redundant catheter tubing outside of the gallbladder fossa. 2.  Contrast injection demonstrates persistent large gallstone, now within the decompressed gallbladder fundus. The cystic, bile ducts are widely patent without evidence of choledocholithiasis. 3.  Percutaneous access was lost during the attempted exchange which  was discussed with the referring surgeon, Dr. Ezzard Standing, and the decision was made not to attempt repeat percutaneous placement of the cholecystostomy tube secondary to patency of the cystic and common bile ducts.  Above findings discussed with Dr. Ezzard Standing prior to the time of procedure completion.   Original Report Authenticated By: Tacey Ruiz, MD       IMPRESSION: Locally advanced anal cancer in elderly patient with significant medical comorbidities  PLAN:  Spoke to the patient and her granddaughter. I've also spoke with medical oncology. Her pelvic and inguinal disease is likely secondary to her local event squamous cell carcinoma of the anal canal. This is the typical spread pattern of this cancer. The typical treatment  for it this malignancy is chemotherapy and radiation over the course of 5-6 weeks. The colostomy free survival rates are around 70-80% with  Full dose chemotherapy and radiation. The likelihood of cure with radiation alone is in the 30% range. I spoke with her about the process of simulation and the placement tattoos. We discussed daily radiation treatments for 5-6 weeks which she was not excited about. I think it would be reasonable to proceed forward with definitive radiation if she would like to. We discussed that at the very minimum I would recommend palliative treatment for 2 weeks to prevent bowel obstruction and prevent bleeding. And no cancers or very radiosensitive so I think we should get a good clinical response with just 2 weeks of radiation. I would recommend treating the inguinal mass as well as the primary as the inguinal mass can Monday through the skin and cause local problems. She does need staging studies and I have asked the primary team to order either a PET scan or a CT of the chest abdomen and pelvis to assess for metastatic disease. Obviously if she has metastatic disease a palliative approach becomes even more appropriate. She can have a staging studies performed as  an outpatient and I will see her back in clinic on March 19. Surgery in this case is usually reserved for salvage. She has been evaluated by surgery and is not an operative candidate due to her other medical comorbidities as well. In all likelihood I believe that if we provide local control for several months her other medical comorbidities are certainly significant. Palliative care consultation is very appropriate.  Lurline Hare, MD [phone]   I spent 30 minutes  face to face with the patient and more than 50% of that time was spent in counseling and/or coordination of care.   ------------------------------------------------  Lurline Hare, MD

## 2012-04-11 NOTE — Progress Notes (Addendum)
3 Days Post-Op  Subjective: Feels very weak and son says she's lost a good deal of weight. Says she feels somewhat SOB, since she came in.  They are somewhat distressed that the results are not back from bx or labs this AM.  Objective: Vital signs in last 24 hours: Temp:  [97.4 F (36.3 C)-98.1 F (36.7 C)] 97.4 F (36.3 C) (03/04 0536) Pulse Rate:  [97-118] 97 (03/04 0536) Resp:  [18-20] 19 (03/04 0536) BP: (104-140)/(51-69) 121/59 mmHg (03/04 0536) SpO2:  [97 %-98 %] 97 % (03/04 0536) Weight:  [91 lb 9.6 oz (41.549 kg)-91 lb 11.4 oz (41.6 kg)] 91 lb 9.6 oz (41.549 kg) (03/04 0536) Last BM Date: 04/07/12 420 ml PO recorded, afebrile, mild tachycardia, H/H is stable yesterday, no labs today  Intake/Output from previous day: 03/03 0701 - 03/04 0700 In: 420 [P.O.:420] Out: 0  Intake/Output this shift:    General appearance: alert and frail elderly woman in no acute distress. Resp: clear to auscultation bilaterally GI: soft, non-tender; bowel sounds normal; no masses,  no organomegaly Extrem:  Significant RA changes UE Lab Results:   Recent Labs  04/10/12 0159 04/10/12 1616  WBC 11.6* 12.8*  HGB 10.7* 10.7*  HCT 32.2* 32.0*  PLT 275 266    BMET  Recent Labs  04/09/12 0520 04/10/12 0159  NA 135 139  K 3.5 3.0*  CL 96 97  CO2 28 30  GLUCOSE 130* 122*  BUN 12 16  CREATININE 0.69 0.67  CALCIUM 9.7 9.3   PT/INR No results found for this basename: LABPROT, INR,  in the last 72 hours   Recent Labs Lab 04/06/12 0541  AST 17  ALT 5  ALKPHOS 75  BILITOT 0.2*  PROT 6.1  ALBUMIN 3.2*     Lipase     Component Value Date/Time   LIPASE 36 02/02/2012 1630     Studies/Results: No results found.  Medications: . amitriptyline  50 mg Oral QHS  . atorvastatin  20 mg Oral QPM  . calcium-vitamin D  1 tablet Oral Daily  . hydrochlorothiazide  25 mg Oral Daily  . leflunomide  20 mg Oral Daily  . metoprolol succinate  100 mg Oral QPM  . pantoprazole  (PROTONIX) IV  40 mg Intravenous Q12H  . polyethylene glycol  17 g Oral BID  . potassium chloride SA  15 mEq Oral TID  . predniSONE  5 mg Oral QAC lunch   Prior to Admission medications   Medication Sig Start Date End Date Taking? Authorizing Provider  acetaminophen (TYLENOL) 500 MG tablet Take 1,000 mg by mouth every 6 (six) hours as needed. pain   Yes Historical Provider, MD  amitriptyline (ELAVIL) 50 MG tablet Take 1 tablet (50 mg total) by mouth at bedtime. 02/08/12 02/07/13 Yes Alison Murray, MD  aspirin-acetaminophen-caffeine (EXCEDRIN EXTRA STRENGTH) [phone] MG per tablet Take 2 tablets by mouth every 6 (six) hours as needed.   Yes Historical Provider, MD  atorvastatin (LIPITOR) 20 MG tablet Take 20 mg by mouth every evening.    Yes Historical Provider, MD  Calcium Carbonate-Vitamin D (CALCIUM 600+D) 600-400 MG-UNIT per tablet Take 1 tablet by mouth daily.     Yes Historical Provider, MD  esomeprazole (NEXIUM) 40 MG capsule Take 1 capsule (40 mg total) by mouth daily before breakfast. 04/26/11 04/25/12 Yes Luis Abed, MD  leflunomide (ARAVA) 20 MG tablet Take 20 mg by mouth daily.    Yes Historical Provider, MD  metoprolol succinate (TOPROL-XL) 100  MG 24 hr tablet Take 100 mg by mouth every evening. Take with or immediately following a meal.   Yes Historical Provider, MD  Multiple Vitamins-Minerals (EYE VITAMINS PO) Take 1 tablet by mouth daily.    Yes Historical Provider, MD  predniSONE (DELTASONE) 5 MG tablet Take 5 mg by mouth daily.     Yes Historical Provider, MD     Assessment/Plan Anorectal mass, path pending Normocytic anemia Aortic stenosis (2D Echo 02/03/13-- Moderate AS, AVA .084cm2, 47 mm gradient, Mild to moderate MR, EF 65%) Hx of CAD/CHF Cholelithiasis/Cholecystitis with percutaneous drain 12/25-12/31/13.  Drain came out, with plans for follow up April 2014 Dr. Ezzard Standing. Cardiology consult in Dec. 27, 2013: "If she would need surgery, she could be assessed  electively." Rheumatoid arthritis on chronic steroids   Plan:  She is back on low fat diet.  Path pending.    Pathology reports squamous cell Ca, phone report, not in computer yet. Dr. Johna Sheriff says she can follow up with oncology, chemotherapy/radiationtherapy. No need for surgery at this time.  I have called Dr. Eben Burow and he will follow up with oncology.  We will see again as needed. I have informed the patient and her son that we have received the above information, son is concerned she cannot handle chemo Rx.     Rectum, biopsy - POSITIVE FOR SQUAMOUS CELL CARCINOMA. - SEE COMMENT. Microscopic Comment Although grading of carcinoma is best done on excision specimen, as sampled the carcinoma appears moderately differentiated. Dr. Colonel Bald has seen this in consultation with agreement of the above diagnosis and comment. The results are communicated to Dr. Carman Ching on 04/11/12. (RAH:caf 04/11/12) Zandra Abts MD Pathologist, Electronic Signature (Case signed 04/11/2012) Specimen Gross and Clinical Information      LOS: 5 days    Marques Ericson 04/11/2012

## 2012-04-11 NOTE — Consult Note (Signed)
[facility]  [phone]   HEMATOLOGY ONCOLOGY CONSULTATION   [name]  DOB: Nov 08, 1925  MR#: [id number]  CSN#: [id number]    Requesting Physician:  Teaching Service    Primary MD: no formal primary care physician.   History of present illness:  Rule out Anal Cancer        79 y.o.  female with multiple medical problems listed below, asked to be seen for evaluation of anal cancer. In review, she was admitted on 04/08/2012 with painless hematochezia after her son had witnessed the event. She does have a history of hemorrhoids at least dating to 40 years, lately a combining by increased bleeding and discomfort. On admission,after Hemoccult returned positive,  a colonoscopy was performed on 2/27 /2014 by Dr. Randa Evens. And pain or rectal mass was seen, suspicious for anal squamous cell carcinoma versus rectal adenocarcinoma. Biopsies were taken, with pathology currently pending. Patient states that a prior colonoscopy about 10 years prior, was negative for any suspicious findings.her H&H on admission was 9.2 and 28.4 respectively, today at 10.5 and 32.1respectively. Anemia panel is pending.As per surgery note on 04/11/2002 team by Dr. Dillard Essex states that she is not a candidate at this time, since it appears that this is squamous cell carcinoma of the anus. CTs of the abdomen and pelvis have been performed for further investigation.  No family history of colon cancer. Denies risk factors for HIV or hepatitis.No Tobacco or ETOH history.No prior radiation.  No Changes in bowel caliber.  She denied Melena. Hematochezia had been present over the last couple of months and more severe form, which she attributed to hemorrhoids. In addition, she states that rectal pain was present at all times.she also reported a total of 10 pound weight loss over the last 2 weeks. Appetite has decreased. She denies any prior history of anemia. She has been increasingly weak, unable to walk  distances without becoming short of breath. The patient had been recently admitted to the hospital for acute cholecystitis, not an operative candidate, status post percutaneous call the drain in December 2013 with exchange on 03/15/2012. We were kindly requested to see the patient with recommendations.    Past medical history:      Past Medical History  Diagnosis Date  . CAD (coronary artery disease)     2 vessel intervention 2003  . Dyslipidemia   . CHF (congestive heart failure)     single episode  . Mitral regurgitation     mild prolapse anterior & posterior leaflets  . Aortic valve sclerosis     mild moderate calcification, echo, 2009  . HTN (hypertension)     difficult to obtain BP at times.  [name] Kitchen Urethral trauma     bleeding in hospital  . Rheumatoid arthritis     severe - deforming  . Lung disease, interstitial     related to rheumatoid arthritis, also question  of left apical nodule...followed elsewhere..my understanding stabilized  . Carotid artery disease     doppler 12/25/2010,  0-39% bilateral  . Ejection fraction     EF 60%, echo, 11/2007  . Clot     ??? apical clot in the past ?? no longer an issue  . Drug therapy     Prednisone.  . Depression   . GERD (gastroesophageal reflux disease)   . Cataract   . Aortic stenosis     Moderately severe, echo, December, 2013  . Rectal bleed 04/06/2012  . Anemia  Past surgical history:      Past Surgical History  Procedure Laterality Date  . Appendectomy    . Breast surgery    . Coronary angioplasty with stent placement  2003  . Colonoscopy N/A 04/08/2012    Procedure: COLONOSCOPY;  Surgeon: Vertell Novak., MD;  Location: [facility] ENDOSCOPY;  Service: Endoscopy;  Laterality: N/A;    Medications:  Prior to Admission:  Prescriptions prior to admission  Medication Sig Dispense Refill  . acetaminophen (TYLENOL) 500 MG tablet Take 1,000 mg by mouth every 6 (six) hours as needed. pain      . amitriptyline (ELAVIL) 50 MG  tablet Take 1 tablet (50 mg total) by mouth at bedtime.  30 tablet  0  . aspirin-acetaminophen-caffeine (EXCEDRIN EXTRA STRENGTH) 250-250-65 MG per tablet Take 2 tablets by mouth every 6 (six) hours as needed.      [name] Kitchen atorvastatin (LIPITOR) 20 MG tablet Take 20 mg by mouth every evening.       . Calcium Carbonate-Vitamin D (CALCIUM 600+D) 600-400 MG-UNIT per tablet Take 1 tablet by mouth daily.        [name] Kitchen esomeprazole (NEXIUM) 40 MG capsule Take 1 capsule (40 mg total) by mouth daily before breakfast.  30 capsule  9  . leflunomide (ARAVA) 20 MG tablet Take 20 mg by mouth daily.       . metoprolol succinate (TOPROL-XL) 100 MG 24 hr tablet Take 100 mg by mouth every evening. Take with or immediately following a meal.      . Multiple Vitamins-Minerals (EYE VITAMINS PO) Take 1 tablet by mouth daily.       . predniSONE (DELTASONE) 5 MG tablet Take 5 mg by mouth daily.          ZOX:[id number], feeding supplement, ondansetron (ZOFRAN) IV, ondansetron  Allergies: No Known Allergies  Family history:     Family History  Problem Relation Age of Onset  . Heart disease Mother   . Heart disease Father                                             Social history:   Widowed for the last 19 years.one son in good health. Lives by herself. 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 Never smoked. No alcohol history. No Recreational drugs.CODE STATUS has not been addressed   Review of systems:  See HPI for significant positives.  Constitutional: Positive weight loss. Negative for fever,night sweats, or chills  Eyes: Negative for blurred vision and double vision.  Respiratory: Negative for cough or  hemoptysis  Positive for  shortness of breath.  Cardiovascular: Negative for chest pain. Negative for palpitations.  GI:  As per HPI. No nausea, vomiting, diarrhea, or constipation.Intermittent GERD symptoms JY:[id number] for hematuria. No loss of urinary control. No Urinary retention.    Skin: Negative for itching. No rash. No petechia. No bruising.  Musculoskeletal: pronounced arthritic deformities at the hands.  Neurological: No headaches. No motor or sensory deficits.No confusion.     Physical exam:      Filed Vitals:   04/11/12 1454  BP:   Pulse: 94  Temp: 97.6 F (36.4 C)  Resp:      Body mass index is 15.24 kg/(m^2).   General: 79 y.o. female  in no acute distress A. and O. x3 , pale, frail, chronically ill appearing. HEENT:Temporal  wasting noted, atraumatic, PERRLA. Oral cavity without thrush or lesions. Neck supple. no thyromegaly, no cervical or supraclavicular adenopathy  Lungs clear bilaterally . No wheezing, rhonchi or rales. No axillary masses. Breasts: not examined. Cardiac regular rate and rhythm normal S1-S2, 3/6 systolic murmur, no  rubs or gallops Abdomen soft nontender , bowel sounds x4. No HSM. No masses palpable.  GU/rectal: nonbleeding 2-3 cm tender anterior anal mass is present. Extremities no clubbing, no  cyanosis or edema. No bruising or petechial rash.  Right inguinal lymphadenopathy is present. Musculoskeletal: no spinal tenderness.  Neuro: Non Focal   Lab results:       CBC  Recent Labs Lab 04/08/12 2342 04/09/12 1636 04/10/12 0159 04/10/12 1616 04/11/12 0640  WBC 10.1 9.7 11.6* 12.8* 11.7*  HGB 10.5* 11.0* 10.7* 10.7* 10.6*  HCT 32.1* 32.9* 32.2* 32.0* 32.4*  PLT 245 264 275 266 257  MCV 94.1 94.0 94.2 94.7 93.9  MCH 30.8 31.4 31.3 31.7 30.7  MCHC 32.7 33.4 33.2 33.4 32.7  RDW 15.2 14.8 15.1 15.0 14.8    Anemia panel:  No results found for this basename: VITAMINB12, FOLATE, FERRITIN, TIBC, IRON, RETICCTPCT,  in the last 72 hours   Chemistries   Recent Labs Lab 04/06/12 0541 04/06/12 0632 04/07/12 0630 04/08/12 0630 04/09/12 0520 04/10/12 0159  NA 141 139 140 142 135 139  K 3.4* 5.6* 3.1* 4.1 3.5 3.0*  CL 105 109 103 105 96 97  CO2 27  --  28 24 28 30   GLUCOSE 98 94 94 80 130* 122*  BUN 13 18 9 10  12 16   CREATININE 0.62 0.70 0.58 0.61 0.69 0.67  CALCIUM 8.9  --  8.8 9.4 9.7 9.3     Coagulation profile  Recent Labs Lab 04/06/12 0910  INR 1.02    Urine Studies No results found for this basename: UACOL, UAPR, USPG, UPH, UTP, UGL, UKET, UBIL, UHGB, UNIT, UROB, ULEU, UEPI, UWBC, URBC, UBAC, CAST, CRYS, UCOM, BILUA,  in the last 72 hours  Studies:      Ir Cholan Exist Tube  03/15/2012  *RADIOLOGY REPORT*  Clinical Data: History of acute calculus cholecystitis, post ultrasound and fluoroscopic-guided cholecystostomy tube placement - 02/05/2012  CHOLANGIOGRAM VIA EXISTING CATHETER  Comparison: Ultrasound fluoroscopic-guided cholecystostomy tube placement - 02/05/2012; head scan dash 02/03/2012; abdominal CT - 02/02/2012; abdominal ultrasound - 02/02/2012  Intravenous medications:  None.  Fluoro time:  8.6 minutes  Complications: During attempted catheter exchange, percutaneous access of the gallbladder was lost and despite prolonged efforts, the track cannot be recanalized.  This was discussed with referring surgeon, Dr. Ezzard Standing,  and the decision was made not to attempt a repeat percutaneous placement of the cholecystostomy tube secondary to patency of the cystic and common bile ducts.  Technique:  The external portion of the existing cholecystostomy tube as well as a surrounding skin was prepped and draped usual sterile fashion. A timeout was performed prior to initiation of the procedure.  A preprocedural spot fluoroscopic image of the existing cholecystostomy tube was obtained. Multiple spot fluoroscopic radiographic images were obtained in various obliquities after injection of a small amount of contrast via the existing cholecystostomy catheter  The external portion of cholecystostomy tube was cut and cannulated with a Benson wire, however the wire was unable to be advanced throughout the entirety of the cholecystostomy tube secondary to internal debris at the catheter tip.  As such, a stiff  Glidewire was attempted to be advanced through the cholecystostomy tube however this  resulted in slight was withdrawal of the cholecystostomy tube secondary to redundant tubing outside of the gallbladder.  Despite the use of an additional regular glide wire, percutaneous access to the gallbladder was lost.  Attempts were made to recanalized the prior cholecystostomy tube track however this proved unsuccessful, again secondary to marked redundancy with the catheter tubing outside of the gallbladder fossa.  Above findings discussed with referring physician, Dr. Ezzard Standing, and the decision was made to not to attempt to perform repeat percutaneous access. As such, the procedure was terminated.  A dressing was placed at the prior cholecystostomy tube site. The patient tolerated the procedure well without immediate postprocedural complication  Findings:  The existing cholecystostomy tube is appropriately positioned and functioning within the decompressed gallbladder, however note is made of markedly redundant tubing outside of the gallbladder fossa.  Contrast injection demonstrates grossly unchanged appearance of large gallstone within the fundus of the now decompressed gallbladder.  The cystic and common bile ducts are widely patent. There is minimal reflux of contrast into the proper hepatic and central aspect of the nondilated intrahepatic biliary tree. There are no discrete filling defects within the biliary tree to suggest the presence of choledocholithiasis.  As stated above, during attempted catheter exchange, access to the decompressed gallbladder was lost and despite efforts, the track could not be recanalized.  The patency of the cystic and common bile ducts were discussed with the referring surgeon, Dr. Ezzard Standing, and the decision was made not to attempt to repeat percutaneous placement at this time.  IMPRESSION:  1.  The existing cholecystostomy tube is appropriately positioned and functioning, however there is  noted to be redundant catheter tubing outside of the gallbladder fossa. 2.  Contrast injection demonstrates persistent large gallstone, now within the decompressed gallbladder fundus. The cystic, bile ducts are widely patent without evidence of choledocholithiasis. 3.  Percutaneous access was lost during the attempted exchange which was discussed with the referring surgeon, Dr. Ezzard Standing, and the decision was made not to attempt repeat percutaneous placement of the cholecystostomy tube secondary to patency of the cystic and common bile ducts.  Above findings discussed with Dr. Ezzard Standing prior to the time of procedure completion.   Original Report Authenticated By: Tacey Ruiz, MD     Assessmnent/Plan:79 y.o. female admitted on 04/07/2011 with pain next hematochezia, found to have a rectal mass suspicious for squamous cell carcinoma of the anus versus rectal adenocarcinoma. Pathology reports from anoscopy performed on 04/06/2012, is currently pending.wwe were kindly asked by Teaching Service to consult patient, anticipating malignancy.  Dr. Arline Asp   is to see the patient following this consult with recommendations further workup studies whileawaiting the results.  An addendum to this note is to be written. Thank you for the referral.   [facility], PA-C 04/11/2012   IMPRESSION:  Squamous cell carcinoma of the anus, Stage T2N2, IIIB if right inguinal lymph node is positive.  PLAN: 1. Radiation oncology consult. 2. To consider a right inguinal lymph node biopsy and/or PET scan (both can be arranged as an outpatient). 3. Will check CXR, CEA and LDH. 4. Discussion with patient and her son.    DISCUSSION: The patient was seen and examined this evening. Her son with whom she lives was not present. I have reviewed the patient's imaging studies with a radiologist.  Biopsy of the anal mass carried out on 04/08/2012 by Dr. Carman Ching shows squamous cell carcinoma, thus this is cancer of the anus. The  standard of care is local  regional radiation in combination with chemotherapy, mitomycin-C and 5 fluorouracil by infusion or capecitabine orally. This patient has a right inguinal mass on physical exam and on the CT scan from 02/02/2012. In addition, the patient has a calcified right external iliac pelvic lymph node. These lymph nodes were also present on the CT scan of 03/16/2009, i.e. almost 3 years ago, which makes the etiology uncertain. The right inguinal lymph node however has increased in size. In order to accurately stage this patient, she would need a biopsy of this right inguinal mass.  Our therapeutic approach to this patient will be complicated by her generally frail status, multiple medical problems--primarily cardiac disease and severe deforming rheumatoid arthritis, weight loss of approximately 10 pounds (the patients recorded weight is currently 92 pounds) and her age of 54.  The patient will need to be evaluated by a radiation oncologist and together we will try to develop a treatment plan appropriate for this patient's anal cancer and clinical status.  Samul Dada 04/11/2012 8:17 PM Pager:  [phone]

## 2012-04-11 NOTE — Clinical Documentation Improvement (Signed)
BMI DOCUMENTATION CLARIFICATION QUERY   CLINICAL DOCUMENTATION QUERIES ARE NOT PART OF THE PERMANENT MEDICAL RECORD   04/11/12   Dr. Zada Girt and/or Associates,   In an effort to better capture your patient's severity of illness, reflect appropriate length of stay and utilization of resources, a review of the patient medical record has revealed the following indicators:   Registered Dietician Assessment 04/07/12 (please see full consult in [facility] - Tonye Becket, RD)  Nutritional Diagnosis - Underweight  Height - 5\' 5"   Weight - 97 lb 10.6 oz  BMI - 16.25 kg/(m^2).   Patient described as being "cachectic" in H&P   Diagnosed this admission with Anal Squamous Cell Carcinoma, treatment plan pending   Based on your clinical judgment, please document in the progress notes and discharge summary if a condition below provides greater specificity regarding the patient's nutritional status:   - Underweight, BMI 16.25   - Cachexia, BMI 16.25   - Other Condition   - Unable to Clinically Determine   In responding to this query please exercise your independent judgment.   The fact that a query is asked, does not imply that any particular answer is desired or expected.    Reviewed: 04/12/12 - underweight with bmi documented in pn 04/12/12 by dr. Zada Girt.  Mathis Dad RN  Thank You,  Jerral Ralph  RN BSN CCDS Certified Clinical Documentation Specialist: Cell   [phone]  [employer] [place]

## 2012-04-11 NOTE — Progress Notes (Signed)
Patient interviewed and examined, agree with PA note above.  Mariella Saa MD, FACS  04/11/2012 10:39 AM

## 2012-04-11 NOTE — Progress Notes (Signed)
Patient interviewed and examined, agree with PA note above. Biopsy has confirmed squamous cell cancer of the anus. At this point surgery does not have a role and conventional treatment would be radiation with chemosensitization. We will sign off for now be available as needed.  Mariella Saa MD, FACS  04/11/2012 6:52 PM

## 2012-04-12 ENCOUNTER — Encounter (HOSPITAL_COMMUNITY): Payer: Self-pay | Admitting: Radiology

## 2012-04-12 ENCOUNTER — Inpatient Hospital Stay (HOSPITAL_COMMUNITY): Payer: Medicare Other

## 2012-04-12 HISTORY — PX: LYMPH NODE BIOPSY: SHX201

## 2012-04-12 LAB — CBC
MCH: 30.7 pg (ref 26.0–34.0)
MCHC: 33.3 g/dL (ref 30.0–36.0)
Platelets: 241 10*3/uL (ref 150–400)
RDW: 15 % (ref 11.5–15.5)

## 2012-04-12 LAB — URINALYSIS, ROUTINE W REFLEX MICROSCOPIC
Glucose, UA: NEGATIVE mg/dL
Nitrite: NEGATIVE
Specific Gravity, Urine: 1.018 (ref 1.005–1.030)
pH: 6 (ref 5.0–8.0)

## 2012-04-12 LAB — URINE MICROSCOPIC-ADD ON

## 2012-04-12 LAB — BASIC METABOLIC PANEL
Calcium: 9.4 mg/dL (ref 8.4–10.5)
GFR calc non Af Amer: 81 mL/min — ABNORMAL LOW (ref >90)
Glucose, Bld: 99 mg/dL (ref 70–99)
Sodium: 136 mEq/L (ref 135–145)

## 2012-04-12 LAB — CEA: CEA: 6.4 ng/mL — ABNORMAL HIGH (ref 0.0–5.0)

## 2012-04-12 MED ORDER — POTASSIUM CHLORIDE 20 MEQ/15ML (10%) PO LIQD
20.0000 meq | Freq: Three times a day (TID) | ORAL | Status: DC
  Administered 2012-04-12 – 2012-04-13 (×4): 20 meq via ORAL
  Filled 2012-04-12 (×6): qty 15

## 2012-04-12 MED ORDER — PANTOPRAZOLE SODIUM 40 MG PO TBEC
40.0000 mg | DELAYED_RELEASE_TABLET | Freq: Two times a day (BID) | ORAL | Status: DC
  Administered 2012-04-12 – 2012-04-15 (×7): 40 mg via ORAL
  Filled 2012-04-12 (×7): qty 1

## 2012-04-12 MED ORDER — DOCUSATE SODIUM 100 MG PO CAPS
100.0000 mg | ORAL_CAPSULE | Freq: Two times a day (BID) | ORAL | Status: DC | PRN
  Filled 2012-04-12: qty 1

## 2012-04-12 NOTE — Procedures (Signed)
SUCCESSFUL RT INGUINAL ADENOPATHY 18 G CORE BX NO COMP STABLE PATH PENDING FULL REPORT IN PACS

## 2012-04-12 NOTE — H&P (Signed)
[name] is an 79 y.o. female.   Chief Complaint: rectal bleeding Rectal mass +sq cell carcinoma Lymphadenopathy noted on 01/2012 CT  Need staging Request for Rt inguinal lymph node bx per Dr Arline Asp HPI: CAD; Rh arthritis; HLD; CHF; Ao and mitral valve abn; HTN; rectal ca  Past Medical History  Diagnosis Date  . CAD (coronary artery disease)     2 vessel intervention 2003  . Dyslipidemia   . CHF (congestive heart failure)     single episode  . Mitral regurgitation     mild prolapse anterior & posterior leaflets  . Aortic valve sclerosis     mild moderate calcification, echo, 2009  . HTN (hypertension)     difficult to obtain BP at times.  [name] Kitchen Urethral trauma     bleeding in hospital  . Rheumatoid arthritis     severe - deforming  . Lung disease, interstitial     related to rheumatoid arthritis, also question  of left apical nodule...followed elsewhere..my understanding stabilized  . Carotid artery disease     doppler 12/25/2010,  0-39% bilateral  . Ejection fraction     EF 60%, echo, 11/2007  . Clot     ??? apical clot in the past ?? no longer an issue  . Drug therapy     Prednisone.  . Depression   . GERD (gastroesophageal reflux disease)   . Cataract   . Aortic stenosis     Moderately severe, echo, December, 2013  . Rectal bleed 04/06/2012  . Anemia     Past Surgical History  Procedure Laterality Date  . Appendectomy    . Breast surgery    . Coronary angioplasty with stent placement  2003  . Colonoscopy N/A 04/08/2012    Procedure: COLONOSCOPY;  Surgeon: Vertell Novak., MD;  Location: [facility] ENDOSCOPY;  Service: Endoscopy;  Laterality: N/A;    Family History  Problem Relation Age of Onset  . Heart disease Mother   . Heart disease Father    Social History:  reports that she has never smoked. She has never used smokeless tobacco. She reports that she does not drink alcohol or use illicit drugs.  Allergies: No Known Allergies  Medications Prior to  Admission  Medication Sig Dispense Refill  . acetaminophen (TYLENOL) 500 MG tablet Take 1,000 mg by mouth every 6 (six) hours as needed. pain      . amitriptyline (ELAVIL) 50 MG tablet Take 1 tablet (50 mg total) by mouth at bedtime.  30 tablet  0  . aspirin-acetaminophen-caffeine (EXCEDRIN EXTRA STRENGTH) 250-250-65 MG per tablet Take 2 tablets by mouth every 6 (six) hours as needed.      [name] Kitchen atorvastatin (LIPITOR) 20 MG tablet Take 20 mg by mouth every evening.       . Calcium Carbonate-Vitamin D (CALCIUM 600+D) 600-400 MG-UNIT per tablet Take 1 tablet by mouth daily.        [name] Kitchen esomeprazole (NEXIUM) 40 MG capsule Take 1 capsule (40 mg total) by mouth daily before breakfast.  30 capsule  9  . leflunomide (ARAVA) 20 MG tablet Take 20 mg by mouth daily.       . metoprolol succinate (TOPROL-XL) 100 MG 24 hr tablet Take 100 mg by mouth every evening. Take with or immediately following a meal.      . Multiple Vitamins-Minerals (EYE VITAMINS PO) Take 1 tablet by mouth daily.       . predniSONE (DELTASONE) 5 MG tablet Take 5 mg by  mouth daily.          Results for orders placed during the hospital encounter of 04/06/12 (from the past 48 hour(s))  CBC     Status: Abnormal   Collection Time    04/10/12  4:16 PM      Result Value Range   WBC 12.8 (*) 4.0 - 10.5 K/uL   RBC 3.38 (*) 3.87 - 5.11 MIL/uL   Hemoglobin 10.7 (*) 12.0 - 15.0 g/dL   HCT 16.1 (*) 09.6 - 04.5 %   MCV 94.7  78.0 - 100.0 fL   MCH 31.7  26.0 - 34.0 pg   MCHC 33.4  30.0 - 36.0 g/dL   RDW 40.9  81.1 - 91.4 %   Platelets 266  150 - 400 K/uL  CBC     Status: Abnormal   Collection Time    04/11/12  6:40 AM      Result Value Range   WBC 11.7 (*) 4.0 - 10.5 K/uL   RBC 3.45 (*) 3.87 - 5.11 MIL/uL   Hemoglobin 10.6 (*) 12.0 - 15.0 g/dL   HCT 78.2 (*) 95.6 - 21.3 %   MCV 93.9  78.0 - 100.0 fL   MCH 30.7  26.0 - 34.0 pg   MCHC 32.7  30.0 - 36.0 g/dL   RDW 08.6  57.8 - 46.9 %   Platelets 257  150 - 400 K/uL  CBC     Status:  Abnormal   Collection Time    04/12/12  5:35 AM      Result Value Range   WBC 10.2  4.0 - 10.5 K/uL   RBC 3.32 (*) 3.87 - 5.11 MIL/uL   Hemoglobin 10.2 (*) 12.0 - 15.0 g/dL   HCT 62.9 (*) 52.8 - 41.3 %   MCV 92.2  78.0 - 100.0 fL   MCH 30.7  26.0 - 34.0 pg   MCHC 33.3  30.0 - 36.0 g/dL   RDW 24.4  01.0 - 27.2 %   Platelets 241  150 - 400 K/uL  BASIC METABOLIC PANEL     Status: Abnormal   Collection Time    04/12/12  5:35 AM      Result Value Range   Sodium 136  135 - 145 mEq/L   Potassium 3.0 (*) 3.5 - 5.1 mEq/L   Chloride 96  96 - 112 mEq/L   CO2 27  19 - 32 mEq/L   Glucose, Bld 99  70 - 99 mg/dL   BUN 19  6 - 23 mg/dL   Creatinine, Ser 5.36  0.50 - 1.10 mg/dL   Calcium 9.4  8.4 - 64.4 mg/dL   GFR calc non Af Amer 81 (*) >90 mL/min   GFR calc Af Amer >90  >90 mL/min   Comment:            The eGFR has been calculated     using the CKD EPI equation.     This calculation has not been     validated in all clinical     situations.     eGFR's persistently     <90 mL/min signify     possible Chronic Kidney Disease.  LACTATE DEHYDROGENASE     Status: None   Collection Time    04/12/12  5:35 AM      Result Value Range   LDH 246  94 - 250 U/L   Comment: HEMOLYSIS AT THIS LEVEL MAY AFFECT RESULT  MAGNESIUM     Status:  None   Collection Time    04/12/12  5:35 AM      Result Value Range   Magnesium 1.9  1.5 - 2.5 mg/dL   Dg Chest 2 View  02/13/1094  *RADIOLOGY REPORT*  Clinical Data: Anal cancer.  Rule out metastasis.  CHEST - 2 VIEW  Comparison: 02/02/2012  Findings: Heart size appears normal.  There is no pleural effusion or edema identified.  Large hiatal hernia is identified and appears similar to the previous examination.  Mild chronic interstitial coarsening noted.  IMPRESSION:  1.  No specific features identified to suggest pulmonary metastatic disease. 2.  Large hiatal hernia.   Original Report Authenticated By: Signa Kell, M.D.     Review of Systems   Constitutional: Positive for weight loss. Negative for fever.  Respiratory: Negative for shortness of breath.   Cardiovascular: Negative for chest pain.  Gastrointestinal: Positive for abdominal pain and blood in stool. Negative for nausea and vomiting.  Neurological: Positive for weakness. Negative for headaches.    Blood pressure 120/68, pulse 87, temperature 97.3 F (36.3 C), temperature source Oral, resp. rate 16, height 5\' 5"  (1.651 m), weight 93 lb 1.6 oz (42.23 kg), SpO2 98.00%. Physical Exam  Constitutional: She is oriented to person, place, and time.  Thin;frail  Cardiovascular: Normal rate and regular rhythm.   Murmur heard. Respiratory: Effort normal and breath sounds normal. She has no wheezes.  GI: Soft. Bowel sounds are normal. There is no tenderness.  Musculoskeletal: Normal range of motion.  Rheumatoid Arthritic fingers- deformed Palpable rt inguinal LN  Neurological: She is alert and oriented to person, place, and time.  Psychiatric: She has a normal mood and affect. Her behavior is normal. Judgment and thought content normal.     Assessment/Plan Rectal bleeding; + squamous cell ca Need for staging Rt Inguinal lymph node bx scheduled Pt and son aware of procedure benefits and risks and agreeable to proceed Consent signed and in chart  TURPIN,PAMELA A 04/12/2012, 12:00 PM

## 2012-04-12 NOTE — Progress Notes (Signed)
Internal Medicine Teaching Service Attending Note Date: 04/12/2012  Patient name: [name]  Medical record number: [id number]  Date of birth: December 03, 1925    This patient has been seen and discussed with the house staff. Please see their note for complete details. I concur with their findings with the following additions/corrections: Patient and family will have a palliative care meeting tomorrow to decide the further course of management with the new diagnosis. Oncology has recommended [place] to obtain lymph node biopsy to assist with further staging. Appreciate oncology and palliative care team for their help.  Lars Mage 04/12/2012, 11:16 AM

## 2012-04-12 NOTE — Progress Notes (Addendum)
Subjective:  No overnight events, she denies fevers, chills, no dysuria or fever. She does not report bloody stools, LBM 4 days ago. She reports history of constipation. She has no vomiting or abdominal pain.   Objective: Vital signs in last 24 hours: Filed Vitals:   04/11/12 0536 04/11/12 1454 04/11/12 2200 04/12/12 0457  BP: 121/59  95/51 120/68  Pulse: 97 94 83 87  Temp: 97.4 F (36.3 C) 97.6 F (36.4 C) 97.6 F (36.4 C) 97.3 F (36.3 C)  TempSrc: Oral Oral Oral Oral  Resp: 19  19 16   Height:      Weight: 91 lb 9.6 oz (41.549 kg)   93 lb 1.6 oz (42.23 kg)  SpO2: 97%  98% 98%   Weight change: 1 lb 6.2 oz (0.63 kg)  Intake/Output Summary (Last 24 hours) at 04/12/12 0829 Last data filed at 04/12/12 0827  Gross per 24 hour  Intake   1200 ml  Output    500 ml  Net    700 ml    Physical Exam Blood pressure 120/68, pulse 87, temperature 97.3 F (36.3 C), temperature source Oral, resp. rate 16, height 5\' 5"  (1.651 m), weight 93 lb 1.6 oz (42.23 kg), SpO2 98.00%. General:  No acute distress, alert and oriented x 3, pale HEENT:  PERRL, EOMI, moist mucous membranes Cardiovascular:  Regular rate and rhythm, 4/6 SEM Respiratory:  Clear to auscultation bilaterally, no wheezes, rales, or rhonchi Abdomen:  Soft, nondistended, nontender, bowel sounds present Extremities:  Warm and well-perfused, no edema with palpable right inguinal LN. Skin: Warm, dry, no rashes Neuro: Not anxious appearing, no depressed mood, normal affect  Lab Results: Basic Metabolic Panel:  Recent Labs Lab 04/10/12 0159 04/12/12 0535  NA 139 136  K 3.0* 3.0*  CL 97 96  CO2 30 27  GLUCOSE 122* 99  BUN 16 19  CREATININE 0.67 0.61  CALCIUM 9.3 9.4   Liver Function Tests:  Recent Labs Lab 04/06/12 0541  AST 17  ALT 5  ALKPHOS 75  BILITOT 0.2*  PROT 6.1  ALBUMIN 3.2*   CBC:  Recent Labs Lab 04/11/12 0640 04/12/12 0535  WBC 11.7* 10.2  HGB 10.6* 10.2*  HCT 32.4* 30.6*  MCV 93.9 92.2   PLT 257 241   Coagulation:  Recent Labs Lab 04/06/12 0910  LABPROT 13.3  INR 1.02   Anemia Panel:  Recent Labs Lab 04/06/12 1212  VITAMINB12 297  FOLATE >20.0  FERRITIN 44  TIBC 241*  IRON 36*  RETICCTPCT 1.7   Medications:  Medications reviewed  Scheduled Meds: . amitriptyline  50 mg Oral QHS  . atorvastatin  20 mg Oral QPM  . calcium-vitamin D  1 tablet Oral Daily  . hydrochlorothiazide  25 mg Oral Daily  . leflunomide  20 mg Oral Daily  . metoprolol succinate  100 mg Oral QPM  . pantoprazole (PROTONIX) IV  40 mg Intravenous Q12H  . polyethylene glycol  17 g Oral BID  . potassium chloride  15 mEq Oral TID  . predniSONE  5 mg Oral QAC lunch   Continuous Infusions: . sodium chloride 125 mL/hr at 04/08/12 0844  . sodium chloride 20 mL/hr (04/07/12 1710)   PRN Meds:.acetaminophen, feeding supplement, HYDROcodone-acetaminophen, ondansetron (ZOFRAN) IV, ondansetron  Assessment/Plan:  79 yr old woman admitted with painless bright red blood per rectum and s/p biopsy on 04/09/2012 with results today showing anal squamous cell carcinoma.    Anal Squamous Cell carcinoma, Stage T2N2, (IIIB if LN bx is  positive): She presented with a large anal rectal mass by colonoscopy which was perfomred by Dr. Randa Evens on 04/08/12 and pathology confirms squamous cell carcinoma. She is otherwise clinically stable and Hgb is stable at 10 -11. No active bleeding currently. CT scan from December 2013 showed enlargement of right external iliac and inguinal lymph nodes that was worrisome for malignancy. Palliative care, medical oncology and radiotherapy were consulted after the biopsy results came back. Patient and her son, Russella Dar were informed of the diagnosis and their questions answered. Hemoglobin is stable.  Plan - appreciate Palliative care, oncology and radiotherapy in put. Palliative care meeting planned on 3/6. - awaiting results of CEA, LDH, Chest X ray  - She will require right inguinal  LN biopsy with PET scan as outpatient - will monitor CBGs daily since Hb is stable  - check and document FOBT on every stool.  - Patient's reported some blood in her urine - we will check U/A today -Transfuse PRBCs as needed to maintain Hgb >8.[name] Kitchen So far Hb is stable.  Leukocytosis (resolved): Her white blood cell count increased from 9.7 on 3/2 to 11.6. Returned to normal today to 10.2. She denies any fevers or chills. There is no clear source of infection. She denies chest pain, she denies dysuria. I suspect that this might be related to her recent biopsy. Unlikely to be related to steroids since she has been chronically taking these. Plan  - No indication for antibiotic yet since patient has not fever - Will monitor clinically , if indicated.  Severe Aortic stenosis: Mod-severe AS noted on last ECHO in 01/2012 with calcification of the leaflets. Mean gradient: 30mm Hg (S). Peak gradient: 47mm Hg (S). Valve area: 0.83cm^2(VTI). Valve area: 0.84cm^2 (Vmax). She is asymptomatic currently but her physical activity is limited. Cardiology did not recommend inpatient evaluation. However, if she requires surgery, this will be an issue preoperatively.  Plan Stable, cont to monitor  Severe Rheumatoid arthritis: This is stable. She will continue with Leflunomide and prednisolone. A stress steroid dose will be required in case of surgical procedures to avoid flares of her RA.   Acute Blood loss Anemia due to rectal bleeding 2/2 anal SCC: Anemia panel with slightly low iron, TIBC, and saturation. Hgb is stable Plan  -Monitor cbc q daily  -iron replacement therapy at dc   Hypokalemia: K level around 3.0.  Has not responded to oral repletion. Most likely due recent HCTZ with renal damping of potassium.  Plan  - Oral k-dur increased from 15 meq tid to 20 meq tid - will monitor daily  - check magnesium level  Underweight: BMI 16.9: She has low PO intake. She reports that she has lost 10 pounds over the  last two weeks. Her current weight is 93 lb.  I believe her PO intake is related to the anal SCC. Nutritional consult encouraged ensure with 350 Kcal and 13 gram of protein. Albumin level is slightly low at 3.2. She is still having low PO intake. Plan  - continue with ensure nutritional supplements - RD to continue to follow   HTN:  BP is fairly controlled inpatient. Plan -will continue metoprolol -continue with HCTZ.  Constipation: Her LBM was on 04/09/2012. She does not like Milarax  Plan  Will try colace   SCDs for DVT Ppx  Dispo Discharge will depend on recommendation by oncology.  The patient does not have a current PCP (KARAM,PHILLIP JEROME, MD), therefore will not be requiring OPC follow-up after discharge.  The patient does not have transportation limitations that hinder transportation to clinic appointments.     LOS: 6 days   Dow Adolph 04/12/2012, 8:29 AM

## 2012-04-13 ENCOUNTER — Inpatient Hospital Stay (HOSPITAL_COMMUNITY): Payer: Medicare Other

## 2012-04-13 DIAGNOSIS — C210 Malignant neoplasm of anus, unspecified: Secondary | ICD-10-CM

## 2012-04-13 LAB — CBC
MCH: 31.5 pg (ref 26.0–34.0)
MCHC: 34 g/dL (ref 30.0–36.0)
Platelets: 244 10*3/uL (ref 150–400)
RDW: 14.8 % (ref 11.5–15.5)

## 2012-04-13 LAB — BASIC METABOLIC PANEL
Calcium: 9.7 mg/dL (ref 8.4–10.5)
GFR calc Af Amer: >90 mL/min (ref >90)
GFR calc non Af Amer: 83 mL/min — ABNORMAL LOW (ref >90)
Sodium: 132 mEq/L — ABNORMAL LOW (ref 135–145)

## 2012-04-13 MED ORDER — POTASSIUM CHLORIDE CRYS ER 20 MEQ PO TBCR
20.0000 meq | EXTENDED_RELEASE_TABLET | Freq: Two times a day (BID) | ORAL | Status: DC
  Administered 2012-04-13 – 2012-04-15 (×4): 20 meq via ORAL
  Filled 2012-04-13 (×5): qty 1

## 2012-04-13 MED ORDER — HYDROCORTISONE 2.5 % RE CREA
1.0000 "application " | TOPICAL_CREAM | Freq: Two times a day (BID) | RECTAL | Status: DC
  Administered 2012-04-13 – 2012-04-14 (×4): 1 via RECTAL
  Filled 2012-04-13: qty 28.35

## 2012-04-13 MED ORDER — ENSURE COMPLETE PO LIQD: 237.0000 mL | Freq: Two times a day (BID) | ORAL | Status: DC | PRN

## 2012-04-13 MED ORDER — SULFAMETHOXAZOLE-TMP DS 800-160 MG PO TABS
1.0000 | ORAL_TABLET | Freq: Two times a day (BID) | ORAL | Status: DC
  Administered 2012-04-13 – 2012-04-15 (×5): 1 via ORAL
  Filled 2012-04-13 (×6): qty 1

## 2012-04-13 MED ORDER — DSS 100 MG PO CAPS: 100.0000 mg | ORAL_CAPSULE | Freq: Two times a day (BID) | ORAL | Status: DC | PRN

## 2012-04-13 MED ORDER — IOHEXOL 300 MG/ML  SOLN
25.0000 mL | INTRAMUSCULAR | Status: AC
  Administered 2012-04-13 (×2): 25 mL via ORAL

## 2012-04-13 MED ORDER — HYDROCORTISONE 2.5 % RE CREA: 1.0000 "application " | TOPICAL_CREAM | Freq: Two times a day (BID) | RECTAL | Status: DC | PRN

## 2012-04-13 MED ORDER — POTASSIUM CHLORIDE CRYS ER 20 MEQ PO TBCR: 20.0000 meq | EXTENDED_RELEASE_TABLET | Freq: Two times a day (BID) | ORAL | Status: DC

## 2012-04-13 MED ORDER — IOHEXOL 300 MG/ML  SOLN
80.0000 mL | Freq: Once | INTRAMUSCULAR | Status: AC | PRN
  Administered 2012-04-13: 80 mL via INTRAVENOUS

## 2012-04-13 MED ORDER — SULFAMETHOXAZOLE-TMP DS 800-160 MG PO TABS: 1.0000 | ORAL_TABLET | Freq: Two times a day (BID) | ORAL | Status: DC

## 2012-04-13 NOTE — Progress Notes (Signed)
Patient's son and patient is concerned with MD order for CT scan and would like to speak with the MD concerning this matter, MD notified and is going to call the room to speak with the patient and her son, will continue to monitor patient. Lorretta Harp RN

## 2012-04-13 NOTE — Evaluation (Signed)
Physical Therapy Evaluation Patient Details Name: [name] MRN: [id number] DOB: [birth year] Today's Date: 04/13/2012 Time: 1435-1450 PT Time Calculation (min): 15 min  PT Assessment / Plan / Recommendation Clinical Impression  patient did well with mobility, requiring overall supervision for mobility.  patient presents with generalized weakness due to prolonged hospitalization and per pt, poor appetite.  Feel patient safe for d/c with 24 assistance x 2-3 days and then intermittent supervision.  Feel patient will return to baseline in steady manner.  No follow up PT recommended.    PT Assessment  Patent does not need any further PT services    Follow Up Recommendations  No PT follow up    Does the patient have the potential to tolerate intense rehabilitation      Barriers to Discharge        Equipment Recommendations  None recommended by PT    Recommendations for Other Services     Frequency      Precautions / Restrictions Precautions Precautions: Fall   Pertinent Vitals/Pain No pain indicated      Mobility  Bed Mobility Bed Mobility: Supine to Sit;Sit to Supine Supine to Sit: 7: Independent Sit to Supine: 7: Independent Transfers Transfers: Sit to Stand;Stand to Sit Sit to Stand: 4: Min guard (pt got lightheaded when first standing; sat back down) Stand to Sit: 5: Supervision Ambulation/Gait Ambulation/Gait Assistance: 5: Supervision Ambulation Distance (Feet): 60 Feet Assistive device: Rolling walker Ambulation/Gait Assistance Details: ambulated 10 feet with hand held assist with min assist; much more stable with RW.   Gait Pattern: Step-through pattern    Exercises     PT Diagnosis:    PT Problem List:   PT Treatment Interventions:     PT Goals    Visit Information  Last PT Received On: 04/13/12 Assistance Needed: +1    Subjective Data  Subjective: I am ready to go home Patient Stated Goal: Go home and see my cats   Prior Functioning  Home  Living Lives With: Family Available Help at Discharge: Family;Available PRN/intermittently (son works during day) Type of Home: House Home Access: Stairs to enter Secretary/administrator of Steps: 3 Entrance Stairs-Rails: Right Home Layout: One level Home Adaptive Equipment: Walker - rolling;Straight cane Prior Function Level of Independence: Independent with assistive device(s) Communication Communication: No difficulties    Cognition  Cognition Overall Cognitive Status: Appears within functional limits for tasks assessed/performed Arousal/Alertness: Awake/alert Orientation Level: Appears intact for tasks assessed Behavior During Session: [facility] for tasks performed    Extremity/Trunk Assessment Right Upper Extremity Assessment RUE ROM/Strength/Tone: Deficits RUE ROM/Strength/Tone Deficits: patient with RA - limiting hand ROM Left Upper Extremity Assessment LUE ROM/Strength/Tone: Deficits LUE ROM/Strength/Tone Deficits: patient with RA - limiting hand ROM Right Lower Extremity Assessment RLE ROM/Strength/Tone: WFL for tasks assessed;Deficits RLE ROM/Strength/Tone Deficits: patient with RA with foot deformities Left Lower Extremity Assessment LLE ROM/Strength/Tone: [facility] for tasks assessed;Deficits LLE ROM/Strength/Tone Deficits: patient with RA with foot deformities Trunk Assessment Trunk Assessment: Normal   Balance Balance Balance Assessed: No (no obvious balance deficits)  End of Session PT - End of Session Activity Tolerance: Patient tolerated treatment well Patient left: in bed;with call bell/phone within reach;with family/visitor present Nurse Communication: Mobility status  GP     Olivia Canter 04/13/2012, 2:57 PM

## 2012-04-13 NOTE — Progress Notes (Signed)
MD discharged patient this evening and patient and son would not like to leave on tonight, MD notified; will report off to night nurse. Lorretta Harp RN

## 2012-04-13 NOTE — Progress Notes (Signed)
Patient has returned from CT; will continue to monitor.  Lorretta Harp RN

## 2012-04-13 NOTE — Discharge Summary (Addendum)
Internal Medicine Teaching [facility] Discharge Note  Name: GLORA HULGAN MRN: [id number] DOB: [birth year] 79 y.o.  Date of Admission: 04/06/2012  5:15 AM Date of Discharge: 04/15/2012 Attending Physician: Lars Mage, MD  Discharge Diagnosis: Principal Problem:   Bright red blood per rectum Active Problems:   Rheumatoid arthritis on chronic steroids   Lymphadenopathy, abdominal   Normocytic anemia   Aortic stenosis   Anal squamous cell carcinoma   Discharge Medications:   Medication List    STOP taking these medications       acetaminophen 500 MG tablet  Commonly known as:  TYLENOL      TAKE these medications       amitriptyline 50 MG tablet  Commonly known as:  ELAVIL  Take 1 tablet (50 mg total) by mouth at bedtime.     atorvastatin 20 MG tablet  Commonly known as:  LIPITOR  Take 20 mg by mouth every evening.     CALCIUM 600+D 600-400 MG-UNIT per tablet  Generic drug:  Calcium Carbonate-Vitamin D  Take 1 tablet by mouth daily.     DSS 100 MG Caps  Take 100 mg by mouth 2 (two) times daily as needed.     esomeprazole 40 MG capsule  Commonly known as:  NEXIUM  Take 1 capsule (40 mg total) by mouth daily before breakfast.     EXCEDRIN EXTRA STRENGTH 250-250-65 MG per tablet  Generic drug:  aspirin-acetaminophen-caffeine  Take 2 tablets by mouth every 6 (six) hours as needed.     EYE VITAMINS PO  Take 1 tablet by mouth daily.     feeding supplement Liqd  Take 237 mLs by mouth 2 (two) times daily between meals as needed (suboptimal PO intake).     hydrocortisone 2.5 % rectal cream  Commonly known as:  ANUSOL-HC  Place 1 application rectally every 12 (twelve) hours as needed (anal irritation). Apply to anal area     leflunomide 20 MG tablet  Commonly known as:  ARAVA  Take 20 mg by mouth daily.     metoprolol succinate 100 MG 24 hr tablet  Commonly known as:  TOPROL-XL  Take 100 mg by mouth every evening. Take with or immediately following a meal.      potassium chloride SA 20 MEQ tablet  Commonly known as:  K-DUR,KLOR-CON  Take 1 tablet (20 mEq total) by mouth 2 (two) times daily.     predniSONE 5 MG tablet  Commonly known as:  DELTASONE  Take 5 mg by mouth daily.     sulfamethoxazole-trimethoprim 800-160 MG per tablet  Commonly known as:  BACTRIM DS  Take 1 tablet by mouth 2 (two) times daily.        Disposition and follow-up:   Ms.Celestina H Caprio was discharged from [facility] in stable condition.  At the hospital follow up visit please address   - Please evaluate the patient for rectal bleeding and do a recheck CBC for hemoglobin level. -Please also consider doing the basic metabolic panel for potassium level. -Please consider referring the patient for nutritional rehabilitation. -The patient will need to followup with an ENT surgeon for assessment of ear pain and hearing deficits. -Please encourage patient to attend her oncology appointment [facility] on 04/26/2012 at 1:30 PM.  Follow-up Appointments: Follow-up Information   [facility]. Schedule an appointment as soon as possible for a visit in 1 week. (Please make appointment for a visit  within one week. )    Contact information:   [address] [place] [address] [phone]     Discharge Orders   Future Appointments Ajayla Iglesias Department Dept Phone   04/26/2012 9:30 AM [facility] Financial Counselor [place] [facility] MEDICAL [facility]OGY [phone]   04/26/2012 9:45 AM Krista Blue [facility] [phone]   04/26/2012 10:00 AM Samul Dada, MD [facility] MEDICAL ONCOLOGY [phone]   04/26/2012 1:30 PM [facility] Nurse [place] [facility] RADIATION ONCOLOGY [phone]   04/26/2012 2:00 PM Lurline Hare, MD [place] [facility] RAD[facility]ATION ONCOLOGY [phone]   04/26/2012 2:30 PM [facility] Nurse [place]  [facility]COL[facility] [phone]   04/26/2012 3:00 PM Lurline Hare, MD [place] [facility] RADIATION ONCOLOGY [phone]   Joint Appt [facility] [place] [facility] RAD[facility] ONCOLOGY [phone]   05/19/2012 2:00 PM Kandis Cocking, MD [facility] [place] [phone]   Future Orders Complete By Expires     Call MD for:  persistant nausea and vomiting  As directed     Call MD for:  severe uncontrolled pain  As directed     Call MD for:  temperature >100.4  As directed     Diet - low sodium heart healthy  As directed     Diet - low sodium heart healthy  As directed     Increase activity slowly  As directed     Increase activity slowly  As directed        Consultations: Treatment Team:  Shirley Friar, MD Vertell Novak., MD Samul Dada, MD Palliative [facility]  Procedures Performed:  Dg Chest 2 View  04/12/2012  *RADIOLOGY REPORT*  Clinical Data: Anal cancer.  Rule out metastasis.  CHEST - 2 VIEW  Comparison: 02/02/2012  Findings: Heart size appears normal.  There is no pleural effusion or edema identified.  Large hiatal hernia is identified and appears similar to the previous examination.  Mild chronic interstitial coarsening noted.  IMPRESSION:  1.  No specific features identified to suggest pulmonary metastatic disease. 2.  Large hiatal hernia.   Original Report Authenticated By: Signa Kell, M.D.    Ir Cholan Exist Tube  03/15/2012  *RADIOLOGY REPORT*  Clinical Data: History of acute calculus cholecystitis, post ultrasound and fluoroscopic-guided cholecystostomy tube placement - 02/05/2012  CHOLANGIOGRAM VIA EXISTING CATHETER  Comparison: Ultrasound fluoroscopic-guided cholecystostomy tube placement - 02/05/2012; head scan dash 02/03/2012; abdominal CT - 02/02/2012; abdominal ultrasound - 02/02/2012  Intravenous medications:  None.  Fluoro time:  8.6 minutes  Complications: During attempted catheter exchange, percutaneous  access of the gallbladder was lost and despite prolonged efforts, the track cannot be recanalized.  This was discussed with referring surgeon, Dr. Ezzard Standing,  and the decision was made not to attempt a repeat percutaneous placement of the cholecystostomy tube secondary to patency of the cystic and common bile ducts.  Technique:  The external portion of the existing cholecystostomy tube as well as a surrounding skin was prepped and draped usual sterile fashion. A timeout was performed prior to initiation of the procedure.  A preprocedural spot fluoroscopic image of the existing cholecystostomy tube was obtained. Multiple spot fluoroscopic radiographic images were obtained in various obliquities after injection of a small amount of contrast via the existing cholecystostomy catheter  The external portion of cholecystostomy tube was cut and cannulated with a Benson wire, however the wire was unable to be advanced throughout the entirety  of the cholecystostomy tube secondary to internal debris at the catheter tip.  As such, a stiff Glidewire was attempted to be advanced through the cholecystostomy tube however this resulted in slight was withdrawal of the cholecystostomy tube secondary to redundant tubing outside of the gallbladder.  Despite the use of an additional regular glide wire, percutaneous access to the gallbladder was lost.  Attempts were made to recanalized the prior cholecystostomy tube track however this proved unsuccessful, again secondary to marked redundancy with the catheter tubing outside of the gallbladder fossa.  Above findings discussed with referring physician, Dr. Ezzard Standing, and the decision was made to not to attempt to perform repeat percutaneous access. As such, the procedure was terminated.  A dressing was placed at the prior cholecystostomy tube site. The patient tolerated the procedure well without immediate postprocedural complication  Findings:  The existing cholecystostomy tube is appropriately  positioned and functioning within the decompressed gallbladder, however note is made of markedly redundant tubing outside of the gallbladder fossa.  Contrast injection demonstrates grossly unchanged appearance of large gallstone within the fundus of the now decompressed gallbladder.  The cystic and common bile ducts are widely patent. There is minimal reflux of contrast into the proper hepatic and central aspect of the nondilated intrahepatic biliary tree. There are no discrete filling defects within the biliary tree to suggest the presence of choledocholithiasis.  As stated above, during attempted catheter exchange, access to the decompressed gallbladder was lost and despite efforts, the track could not be recanalized.  The patency of the cystic and common bile ducts were discussed with the referring surgeon, Dr. Ezzard Standing, and the decision was made not to attempt to repeat percutaneous placement at this time.  IMPRESSION:  1.  The existing cholecystostomy tube is appropriately positioned and functioning, however there is noted to be redundant catheter tubing outside of the gallbladder fossa. 2.  Contrast injection demonstrates persistent large gallstone, now within the decompressed gallbladder fundus. The cystic, bile ducts are widely patent without evidence of choledocholithiasis. 3.  Percutaneous access was lost during the attempted exchange which was discussed with the referring surgeon, Dr. Ezzard Standing, and the decision was made not to attempt repeat percutaneous placement of the cholecystostomy tube secondary to patency of the cystic and common bile ducts.  Above findings discussed with Dr. Ezzard Standing prior to the time of procedure completion.   Original Report Authenticated By: Tacey Ruiz, MD    US Biopsy  04/12/2012  *RADIOLOGY REPORT*  Clinical Data: Right inguinal adenopathy, history of anal squamous cell carcinoma  ULTRASOUND RIGHT INGUINAL ADENOPATHY CORE BIOPSY  Date:  04/12/2012 15:00:00  Radiologist:  M.  Ruel Favors, M.D.  Medications:  1% lidocaine locally  Guidance:  Ultrasound  Complications:  No immediate  PROCEDURE/FINDINGS:  Informed consent was obtained from the patient following explanation of the procedure, risks, benefits and alternatives. The patient understands, agrees and consents for the procedure. All questions were addressed.  A time out was performed.  Maximal barrier sterile technique utilized including caps, mask, sterile gowns, sterile gloves, large sterile drape, hand hygiene, and betadine  Previous imaging reviewed.  Ultrasound performed of the right inguinal palpable adenopathy.  Under sterile conditions and local anesthesia, an 18 gauge core biopsy needle was advanced into the right inguinal adenopathy under direct ultrasound.  Five core biopsies were obtained and placed in saline.  Needle removed.  Post procedure imaging demonstrates no evidence of hemorrhage or hematoma.  The patient tolerated the biopsy well.  IMPRESSION: Successful ultrasound right  inguinal adenopathy 18 gauge core biopsies   Original Report Authenticated By: Judie Petit. Miles Costain, M.D.      Admission HPI:  Chief Complaint: Bright red blood per rectum.   History of Present Illness: Patient is a pleasant 77 year woman with past history significant for CAD status post stenting, aortic stenosis, mitral regurg, recent acute cholecystitis, rheumatoid arthritis and other medical problems as per problem list who comes to the ED due to pain is bright red blood per rectum since midnight.  Around midnight today she went for a bowel movement and found bright red blood in it. The stool was covered with blood and not just streaks of blood. She denies any associated abdominal pain, rectal pain, tenesmus. She does not have any nausea vomiting, hematemesis.  She reports about 5 episodes of bloody bowel movement since midnight- last one about an hour before while in ED.  She denies any fever, chills, headache, palpitations, chest pain,  shortness of breath.  She gets take Tylenol and Excedrin for her arthritis everyday- although does not report any epigastric pain or symptoms of PUD.  She does report progressive slow decrease in appetite and weight loss for past few years, nothing acute.  Review of Systems:  As per history of present illness, all other systems reviewed and negative.   Physical Exam:  Blood pressure 123/49, pulse 85, temperature 97.4 F (36.3 C), temperature source Oral, resp. rate 20, SpO2 100.00%.  Constitutional: Vital signs reviewed. Patient is old cachectic woman in no acute distress and cooperative with exam. Alert and oriented x3.  Head: Normocephalic and atraumatic  Mouth: no erythema or exudates, MMM  Eyes: PERRL, EOMI, pale conjunctiva, No scleral icterus.  Neck: Supple, Trachea midline normal ROM, No JVD  Cardiovascular: RRR, S1 normal, S2 normal, systolic murmur.  Pulmonary/Chest: CTAB, no wheezes, rales, or rhonchi  Abdominal: Soft. Non-tender, non-distended, bowel sounds are normal, no masses, organomegaly, or guarding present.  GU: no CVA tenderness Musculoskeletal: No joint deformities, erythema, or stiffness, ROM full and no nontender Hematology: Right inguinal lymph node palpated. Nontender  Neurological: A&O x3, Strength is normal and symmetric bilaterally, cranial nerve II-XII are grossly intact.  Skin: Warm, dry and intact. No rash, cyanosis, or clubbing.  Psychiatric: Normal mood and affect. speech and behavior is normal. Judgment and thought content normal. Cognition and memory are normal.  Rectal exam per EDP: BRBPR with external hemorrhoids presents - no obvious bleeding source by exam     Hospital Course by problem list:  Anal Squamous Cell carcinoma, Stage T2N2: Ms. Guin presented with acute rectal bleeding. She was found to have a large anal rectal mass by colonoscopy which was performed by Dr. Randa Evens on 04/08/12. A biopsy was performed and surgical pathology confirmed  squamous cell carcinoma. Over her course of stay, she remained clinically stable, but she had some acute blood loss due to the above-mentioned bleeding, which required transfusion with one unit of blood on the 04/07/2012. Her hemoglobin improved to from 7.6 to 10, and remained stable. She did not report any more episodes of frank rectal bleeding. CT scan from December 2013 had showed enlargement of right external iliac and inguinal lymph nodes that was worrisome for malignancy. Oncology was consulted and recommended a right inguinal lymph node biopsy, which was done under ultrasound guidance on 04/12/2012 for further staging. Pathology from this lymph node biopsy is pending. Palliative care and radiotherapy were also consulted and assisted in her care. A staging CT scan of the chest, abdomen and  pelvis was ordered. The results of this will be reviewed during outpatient followup with oncology on 04/26/2012. She has a PT evaluation during her stay.  Uncomplicated Urinary tract infection: Ms. Wilds was also noted to have some increase in her white blood cell count up to a peak of 11.6. She had initially denied any symptoms of dysuria, however, a urine exam revealed moderate leukocytes, and nitrites. She does not have symptoms of cough, or shortness of breath to suggest a chest infection. She was initiated on a treatment for urinary tract infection with Bactrim 960 mg twice a day for 3 days. A urine culture showing enterococcus > 100,000 colonies/ml. C/S pending and this will be followed up as outpatient.   Hiatal hernia: She has a long-standing history of a large hiatal hernia. That has been seen on multiple imaging. She reports that she gets some reflux symptoms from this hernia. She usually manages these symptoms with Protonix at home. She will be discharged of the same.   Severe Aortic stenosis: Mod-severe AS noted on last ECHO in 01/2012 with calcification of the leaflets. Mean gradient: 30mm Hg (S). Peak  gradient: 47mm Hg (S). Valve area: 0.83cm^2(VTI). Valve area: 0.84cm^2 (Vmax). She is asymptomatic currently but her physical activity is limited. Cardiology did not recommend inpatient evaluation. However, if she requires surgery, this will be an issue preoperatively.    Severe Rheumatoid arthritis: This is stable. She will continue with Leflunomide and prednisolone. A stress steroid dose will be required in case of surgical procedures to avoid flares of her RA. She follow with rheumatology. She was discharged on her usual medications.  Acute Blood loss Anemia due to rectal bleeding 2/2 anal SCC: Anemia panel with slightly low iron, TIBC, and saturation. Hgb remained stable as discussed already. She will require close monitoring of her CBC at outpatient visits.   Hypokalemia: K level around 3.0. Has not responded to oral repletion. Most likely due recent HCTZ with renal damping of potassium. Mg level was normal. She was discharged on Oral k-dur increased of 20 meq tid. She will require recheck BMET as outpatient.   Underweight: BMI 16.9: She has low PO intake. She reports that she has lost 10 pounds over the last two weeks. Her current weight is 93 lb. I believe her PO intake is related to the anal SCC and possibly the UTI. Nutritional consult encouraged ensure with 350 Kcal and 13 gram of protein. Albumin level is slightly low at 3.2. She is still having low PO intake. She will be discharged on nutritional supplements. Given her cancer diagnosis, nutritional status needs further evaluation as outpatient as it might impact on her treatment with chemotherapy and radiation.  Hypertension: BP remained fairly controlled inpatient. She was discharged on a high usual metoprolol, and HCTZ.  Constipation: Her LBM was on 04/09/2012. She does not like Milarax. Her symptoms of anal irritation were treated with Anusol cream. She will also continue to use Colace as needed for constipation.  Ms. Castillo does not have a  current PCP, but has been going to urgent care at [facility] [address]. She prefers to go there and try to identify a PCP for her health care needs. The patient does not have transportation limitations that hinder transportation to clinic appointments. I have discussed with her son regarding the importance of keeping his appointments and establishing care with a primary physician. They verbalized understanding.   Discharge Vitals:  BP 118/63  Pulse 92  Temp(Src) 97.3 F (36.3 C) (Oral)  Resp 16  Ht 5\' 5"  (1.651 m)  Wt 91 lb 7.9 oz (41.5 kg)  BMI 15.22 kg/m2  SpO2 95%  Discharge Labs:  No results found for this or any previous visit (from the past 24 hour(s)).  Signed: Dow Adolph 04/15/2012, 9:04 AM   Time Spent on Discharge: 45 minutes  Services Ordered on Discharge: None Equipment Ordered on Discharge: None

## 2012-04-13 NOTE — Progress Notes (Signed)
Internal Medicine Teaching Service Attending Note Date: 04/13/2012  Patient name: [name]  Medical record number: [id number]  Date of birth: 08-30-1925    This patient has been seen and discussed with the house staff. Please see their note for complete details. I concur with their findings with the following additions/corrections: I met with [name] and her son this morning after the palliative care meeting. It seems that patient and her son wants to pursue the complete treatment at this time. Oncology, Dr. Arline Asp suggested radiation oncology consult, lymph node biopsy and other blood tests which have been completed at this time. Patient will be followed by oncology and radiation oncology who would work together to develop a treatment plan.  Patient's hemoglobin has been stable for last 3 days and she has not had bloody bowel movement in last 2 days.  I believe that patient is medically stable to be discharged home with outpatient followup. Patient does not have a primary care physician at this time and will need one prior to discharge to coordinate the care among different specialists. We will get physical therapy evaluation to suggest appropriate discharge recommendations at this time.  GARG, ANKIT 04/13/2012, 10:30 AM

## 2012-04-13 NOTE — Progress Notes (Signed)
Subjective:  No overnight events, she denies fevers, chills, no dysuria or fever. She had 2 bowel movements yesterday. There was no blood. However, she reports some irritation in the anal area, especially with passing stool. She reports history of constipation. She has no vomiting or abdominal pain.   Objective: Vital signs in last 24 hours: Filed Vitals:   04/13/12 0510 04/13/12 0558 04/13/12 0948 04/13/12 1358  BP: 74/60 119/62 109/52 104/86  Pulse: 101  98 106  Temp: 97.8 F (36.6 C)  98.3 F (36.8 C) 98.2 F (36.8 C)  TempSrc: Oral  Oral Oral  Resp: 19  18 18   Height:      Weight: 92 lb 9.6 oz (42.003 kg)     SpO2: 100%  98% 100%   Weight change: -8 oz (-0.227 kg)  Intake/Output Summary (Last 24 hours) at 04/13/12 1441 Last data filed at 04/13/12 1300  Gross per 24 hour  Intake    600 ml  Output    675 ml  Net    -75 ml    Physical Exam Blood pressure 104/86, pulse 106, temperature 98.2 F (36.8 C), temperature source Oral, resp. rate 18, height 5\' 5"  (1.651 m), weight 92 lb 9.6 oz (42.003 kg), SpO2 100.00%. General:  No acute distress, alert and oriented x 3, pale HEENT:  PERRL, EOMI, moist mucous membranes. Her right ear has moderate ear wax impaction but otherwise normal exam. Cardiovascular:  Regular rate and rhythm, 4/6 SEM Respiratory:  Clear to auscultation bilaterally, no wheezes, rales, or rhonchi Abdomen:  Soft, nondistended, nontender, bowel sounds present Extremities:  Warm and well-perfused, no edema with palpable right inguinal LN. Skin: Warm, dry, no rashes Neuro: Not anxious appearing, no depressed mood, normal affect  Lab Results: Basic Metabolic Panel:  Recent Labs Lab 04/12/12 0535 04/13/12 0510  NA 136 132*  K 3.0* 4.1  CL 96 96  CO2 27 23  GLUCOSE 99 93  BUN 19 19  CREATININE 0.61 0.57  CALCIUM 9.4 9.7  MG 1.9  --    CBC:  Recent Labs Lab 04/12/12 0535 04/13/12 0510  WBC 10.2 10.7*  HGB 10.2* 10.1*  HCT 30.6* 29.7*  MCV 92.2  92.5  PLT 241 244   Medications:  Medications reviewed  Scheduled Meds: . amitriptyline  50 mg Oral QHS  . atorvastatin  20 mg Oral QPM  . calcium-vitamin D  1 tablet Oral Daily  . hydrochlorothiazide  25 mg Oral Daily  . hydrocortisone  1 application Rectal BID  . leflunomide  20 mg Oral Daily  . metoprolol succinate  100 mg Oral QPM  . pantoprazole  40 mg Oral BID AC  . potassium chloride SA  20 mEq Oral BID  . predniSONE  5 mg Oral QAC lunch  . sulfamethoxazole-trimethoprim  1 tablet Oral Q12H   Continuous Infusions: . sodium chloride 125 mL/hr at 04/08/12 0844  . sodium chloride 20 mL/hr (04/07/12 1710)   PRN Meds:.acetaminophen, docusate sodium, feeding supplement, HYDROcodone-acetaminophen, ondansetron (ZOFRAN) IV, ondansetron  Assessment/Plan:  79 yr old woman admitted with painless bright red blood per rectum and s/p biopsy on 04/09/2012 with results today showing anal squamous cell carcinoma.    Anal Squamous Cell carcinoma, Stage T2N2, (IIIB if LN bx is positive): Ms. Gage presented with rectal bleeding. She was found to have a large anal rectal mass by colonoscopy which was performed by Dr. Randa Evens on 04/08/12. A biopsy was performed and surgical pathology confirmed squamous cell carcinoma. Over her course  of stay she remained clinically stable, but she had some acute blood loss due to the above-mentioned bleeding, which required transfusion. Her hemoglobin improved to 10, and remained stable during her course of stay. She did not report any more episodes of frank rectal bleeding. Other review of CT scan from December 2013 had showed enlargement of right external iliac and inguinal lymph nodes that was worrisome for malignancy. Oncology was consulted and recommended a right inguinal lymph node biopsy, which was done under ultrasound guidance on 04/12/2012. Pathology from this lymph node biopsies pending. Palliative care and radiotherapy were also consulted and assisted in her  care. A staging CT scan of the chest, abdomen and pelvis was ordered. The results of this will be reviewed during outpatient followup with oncology on 04/26/2012.   Plan. -Chest, abdomen, and pelvis CT scan with and without contrast. - Physical therapy to recommend on her disposition - appreciate Palliative care, oncology and radiotherapy in put. Palliative care meeting planned on 3/6. - PET scan may be performed as outpatient - check and document FOBT on every stool.  -Hemoglobin is stable.  Uncomplicated Urinary tract infection: Ms. Consoli reported some dysuria. She was also noted to have some increase in her white blood cell count up to a peak of 11.6. She had initially denied any symptoms of dysuria, however, a urine exam revealed moderate leukocytes, and nitrites. She does not have symptoms of cough, or shortness of breath. She was initiated on a treatment for urinary tract infection with Bactrim 960 mg twice a day for 3 days. A urine culture was ordered and this can be followed up as outpatient.   Plan  - Will start Bactrim 960 mg twice a day for treatment of the UTI  - Will monitor clinically , if indicated.  Hiatal hernia: Patient has a long-standing history of a large hiatal hernia. That has been seen on multiple imaging. She reports that she gets some reflux symptoms from this hernia. She usually manages these symptoms with Protonix at home. She will be discharged of the same.   Severe Aortic stenosis: Mod-severe AS noted on last ECHO in 01/2012 with calcification of the leaflets. Mean gradient: 30mm Hg (S). Peak gradient: 47mm Hg (S). Valve area: 0.83cm^2(VTI). Valve area: 0.84cm^2 (Vmax). She is asymptomatic currently but her physical activity is limited. Cardiology did not recommend inpatient evaluation. However, if she requires surgery, this will be an issue preoperatively.  Plan Stable, cont to monitor  Severe Rheumatoid arthritis: This is stable. She will continue with  Leflunomide and prednisolone. A stress steroid dose will be required in case of surgical procedures to avoid flares of her RA.   Acute Blood loss Anemia due to rectal bleeding 2/2 anal SCC: Anemia panel with slightly low iron, TIBC, and saturation. Hgb is stable Plan  -Monitor cbc q daily  -iron replacement therapy at dc  Hypokalemia: K level around 3.0.  Has not responded to oral repletion. Most likely due recent HCTZ with renal damping of potassium.  Plan  - Oral k-dur increased from 15 meq tid to 20 meq tid - will monitor daily  - check magnesium level  Underweight: BMI 16.9: She has low PO intake. She reports that she has lost 10 pounds over the last two weeks. Her current weight is 93 lb.  I believe her PO intake is related to the anal SCC and possibly the UTI. Nutritional consult encouraged ensure with 350 Kcal and 13 gram of protein. Albumin level is slightly low  at 3.2. She is still having low PO intake. She will be discharged on nutritional supplements.  Plan  - continue with ensure nutritional supplements - RD to continue to follow   HTN:  BP is fairly controlled inpatient. Plan -will continue metoprolol -continue with HCTZ.  Constipation: Her LBM was on 04/09/2012. She does not like Milarax. Her symptoms of anal irritation were treated with Anusol cream.    Plan  Continue with colace   SCDs for DVT Ppx  Dispo She will be discharged after PT evaluation and CT scan.  The patient does not have a current PCP, but has been going to urgent care at [facility] [address]. She prefers to go there and try to identify a PCP for her health care needs. The patient does not have transportation limitations that hinder transportation to clinic appointments.     LOS: 7 days   Dow Adolph 04/13/2012, 2:41 PM

## 2012-04-14 ENCOUNTER — Telehealth: Payer: Self-pay | Admitting: Oncology

## 2012-04-14 LAB — BASIC METABOLIC PANEL
BUN: 11 mg/dL (ref 6–23)
CO2: 19 mEq/L (ref 19–32)
Calcium: 9.7 mg/dL (ref 8.4–10.5)
Chloride: 93 mEq/L — ABNORMAL LOW (ref 96–112)
Creatinine, Ser: 0.63 mg/dL (ref 0.50–1.10)

## 2012-04-14 LAB — CBC
HCT: 29.9 % — ABNORMAL LOW (ref 36.0–46.0)
MCH: 31.4 pg (ref 26.0–34.0)
MCV: 88.5 fL (ref 78.0–100.0)
Platelets: 219 10*3/uL (ref 150–400)
RBC: 3.38 MIL/uL — ABNORMAL LOW (ref 3.87–5.11)
WBC: 10.2 10*3/uL (ref 4.0–10.5)

## 2012-04-14 NOTE — Consult Note (Signed)
Patient EA:[id number] JADY BRAGGS      DOB: 1925/02/28      JYN:[id number]     Consult Note from the Palliative Medicine Team at [facility]    Consult Requested by: Dr. Eben Burow    PCP: Tula Nakayama, MD Reason for Consultation: Goals of care     Phone Number:[phone]  Assessment of patients Current state: Patient is an 79 year old white female with a known past medical history for rheumatoid arthritis, coronary artery disease with a history stenting, moderate to severe aortic stenosis and mitral regurg. She was recently treated for acute cholecystitis. She was admitted to the emergency room with chief complaint of bright red blood per rectum. Patient had 5 bloody bowel movements over the course of the evening. She did not report any pain with this. She was therefore admitted for further observation. She was found to have a rectal mass, biopsy showed squamous cell carcinoma. Patient underwent biopsy of a right inguinal lymph node which will be used for the purpose of treatment and staging. The patient was seen by Dr. Arline Asp who will put together an outpatient plan between the medical oncology and radiation oncology. The patient and her son were very overwhelmed with this recent diagnosis. They are concerned about finances related to her treatment. They are concerned about secondary side effects from chemotherapy, which I assured them documents and would be sensitive to. In particular the patient has a significant intolerance to nausea and vomiting because of her head all hernia. She is experiencing some rectal irritation and pressure status post colonoscopy. She's not using the medication has been left for her which we encouraged her to do. It was noted during her visit the patient likely has a urinary tract infection and all agreed that we would go ahead with treatment. The patient herself has discussed advanced directives and a small part with her son [name] at this time she desires a full CODE STATUS  however, she and her son are going to relook at this in light of the new information that they have about her cancer. She has always told [name] that she would not want to lay brain dead on the machine. We took the conversation a step further and discussing the fact that with her heart condition she would likely get into trouble with regard to the need for CPR related to her poor heart and she agreed to think about and can indicate with her son regarding her wishes.   Goals of Care: 1.  Code Status: Full   2. Scope of Treatment: At this time the patient and her son desired to review their options and make decisions regarding chemotherapy as an outpatient. He would like to continue her current treatments for her chronic illnesses. They are in many ways overwhelmed at this time and would benefit from further goals of care if she were to become hospitalized. Her expectation is now patient can medication with medical oncology regarding treatment which may include radiation. We offered emotional support and support of her oncology practitioners. Of note the topic of hospice care was not discussed during this visit as the patient is still in the early phases of determining what her chemotherapeutic regime would be. We'll be glad to discuss this in the future after all options have been explored.  4. Disposition: To home when medically stable.   3. Symptom Management:  1. Pain: Vicodin was ordered by oncology I concur with the use of this for pain control at this time. 2. Bowel  Regimen: Patient should obviously be on a rigorous bowel regime to help with less irritation of her rectum. 3. Rheumatoid arthritis on medication should be reviewed by the primary service and collaboration with her rheumatologist should be undertaken especially in the face of possible infection. 4. Urinary tract infection may be contributing to her anorexia I would treat this and consider waiting for the Holter to return before  discharging to home unless aggressive outpatient followup can be arranged as she has been instrumented and is at high risk for potential resistant organism. She did have an Escherichia coli infection of her gallbladder in the past several months which was sensitive to most medications  4. Psychosocial: Patient is guarded with regard to her past medical history. She has capacity for decision making and is very much in control of her choices. She is quiet and reserved as is her son which can be mistaken for an difference. She and her son appear to be quite close.  5. Spiritual: Not fully discussed other than to say that shehas a faint that she can turn to.         Patient Documents Completed or Given: Document Given Completed  Advanced Directives Pkt    MOST    DNR    Gone from My Sight    Hard Choices      Brief HPI: Patient is a 79 year old white female with a known past medical history for aortic stenosis and coronary artery disease. The patient was admitted with bright red blood per rectum and was found to have a rectal mass with possible lymph node involvement. She underwent biopsy of the lymph node which is pending. Medical and rad oncology has been consulted and will set up an appointment to collaborate on her care as an outpatient.  ROS: Rectal irritation, fatigue, anorexia, no nausea or vomiting at this time     PMH:  Past Medical History  Diagnosis Date  . CAD (coronary artery disease)     2 vessel intervention 2003  . Dyslipidemia   . CHF (congestive heart failure)     single episode  . Mitral regurgitation     mild prolapse anterior & posterior leaflets  . Aortic valve sclerosis     mild moderate calcification, echo, 2009  . HTN (hypertension)     difficult to obtain BP at times.  [name] Kitchen Urethral trauma     bleeding in hospital  . Rheumatoid arthritis     severe - deforming  . Lung disease, interstitial     related to rheumatoid arthritis, also question  of left  apical nodule...followed elsewhere..my understanding stabilized  . Carotid artery disease     doppler 12/25/2010,  0-39% bilateral  . Ejection fraction     EF 60%, echo, 11/2007  . Clot     ??? apical clot in the past ?? no longer an issue  . Drug therapy     Prednisone.  . Depression   . GERD (gastroesophageal reflux disease)   . Cataract   . Aortic stenosis     Moderately severe, echo, December, 2013  . Rectal bleed 04/06/2012  . Anemia      PSH: Past Surgical History  Procedure Laterality Date  . Appendectomy    . Breast surgery    . Coronary angioplasty with stent placement  2003  . Colonoscopy N/A 04/08/2012    Procedure: COLONOSCOPY;  Surgeon: Vertell Novak., MD;  Location: [facility] ENDOSCOPY;  Service: Endoscopy;  Laterality: N/A;  I have reviewed the FH and SH and  If appropriate update it with new information. No Known Allergies Scheduled Meds: . amitriptyline  50 mg Oral QHS  . atorvastatin  20 mg Oral QPM  . calcium-vitamin D  1 tablet Oral Daily  . hydrochlorothiazide  25 mg Oral Daily  . hydrocortisone  1 application Rectal BID  . leflunomide  20 mg Oral Daily  . metoprolol succinate  100 mg Oral QPM  . pantoprazole  40 mg Oral BID AC  . potassium chloride SA  20 mEq Oral BID  . predniSONE  5 mg Oral QAC lunch  . sulfamethoxazole-trimethoprim  1 tablet Oral Q12H   Continuous Infusions: . sodium chloride 125 mL/hr at 04/08/12 0844  . sodium chloride 20 mL/hr (04/07/12 1710)   PRN Meds:.acetaminophen, docusate sodium, feeding supplement, HYDROcodone-acetaminophen, ondansetron (ZOFRAN) IV, ondansetron    BP 117/55  Pulse 99  Temp(Src) 97.9 F (36.6 C) (Oral)  Resp 16  Ht 5\' 5"  (1.651 m)  Wt 42.3 kg (93 lb 4.1 oz)  BMI 15.52 kg/m2  SpO2 94%   PPS: 40-50%  Intake/Output Summary (Last 24 hours) at 04/14/12 1108 Last data filed at 04/14/12 0942  Gross per 24 hour  Intake   1720 ml  Output    650 ml  Net   1070 ml      Last bowel movement was  today is too small amounts, no blood is listed                 Physical Exam:  General: No acute distress but the patient appears frail and weak she has full capacity for decision making  HEENT:  Pupils are equal round reactive to light eyes are sunken his temporal wasting his membranes are moist  Chest:    decreased but clear to auscultation his daughters or wheezes  WUJ:WJXBJYN rate and rhythm, positive S1 and S2 4/6 systolic ejection murmur 306 diastolic ejection murmur in the mitral area Abdomen: scaphoid nontender nondistended Ext:  no edema her hands have classic deformities consistent with rheumatoid arthritis Neuro: Awake alert but understandably sad and slightly depressed regarding her current circumstances   Labs: CBC    Component Value Date/Time   WBC 10.2 04/14/2012 0600   WBC 12.8* 02/02/2012 1516   RBC 3.38* 04/14/2012 0600   RBC 4.15 02/02/2012 1516   HGB 10.6* 04/14/2012 0600   HGB 12.5 02/02/2012 1516   HCT 29.9* 04/14/2012 0600   HCT 41.7 02/02/2012 1516   PLT 219 04/14/2012 0600   MCV 88.5 04/14/2012 0600   MCV 100.4* 02/02/2012 1516   MCH 31.4 04/14/2012 0600   MCH 30.1 02/02/2012 1516   MCHC 35.5 04/14/2012 0600   MCHC 30.0* 02/02/2012 1516   RDW 14.5 04/14/2012 0600   LYMPHSABS 1.8 03/15/2012 0940   MONOABS 0.9 03/15/2012 0940   EOSABS 0.6 03/15/2012 0940   BASOSABS 0.0 03/15/2012 0940       CMP     Component Value Date/Time   NA 129* 04/14/2012 0600   K 3.6 04/14/2012 0600   CL 93* 04/14/2012 0600   CO2 19 04/14/2012 0600   GLUCOSE 82 04/14/2012 0600   BUN 11 04/14/2012 0600   CREATININE 0.63 04/14/2012 0600   CALCIUM 9.7 04/14/2012 0600   PROT 6.1 04/06/2012 0541   ALBUMIN 3.2* 04/06/2012 0541   AST 17 04/06/2012 0541   ALT 5 04/06/2012 0541   ALKPHOS 75 04/06/2012 0541   BILITOT 0.2* 04/06/2012 0541  GFRNONAA 80* 04/14/2012 0600   GFRAA >90 04/14/2012 0600    Chest Xray Reviewed/Impressions: Negative for metastatic or infectious process but notable for her large hiatal hernia        Time In Time Out Total Time Spent with Patient Total Overall Time  8:40 AM   9:25 AM   45 minutes 45 minutes    Discussed with Dr. Kirtland Bouchard from teaching service  Greater than 50%  of this time was spent counseling and coordinating care related to the above assessment and plan.   Melissa L. Ladona Ridgel, MD MBA The Palliative Medicine Team at [facility] Phone: [phone] Pager: [phone]  Addendum: I was  Able to talk with Dr. Lorn Junes and person. His office will call the patient's son and the patient to set up an outpatient appointment.  Melissa L. Ladona Ridgel, MD MBA The Palliative Medicine Team at [facility] Phone: [phone] Pager: [phone]
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
## 2012-04-14 NOTE — Progress Notes (Signed)
Internal Medicine Teaching Service Attending Note Date: 04/14/2012  Patient name: [name]  Medical record number: [id number]  Date of birth: [birth year]    This patient has been seen and discussed with the house staff. Please see their note for complete details. I concur with their findings with the following additions/corrections: Patient is medically stable to discharged at this time.  Lars Mage 04/14/2012, 9:50 PM

## 2012-04-14 NOTE — Telephone Encounter (Signed)
C/D 04/14/12 for appt. 04/26/12

## 2012-04-14 NOTE — Progress Notes (Signed)
Patient UJ:[id number] [name]      DOB: April 19, 1925      GNF:[id number] Summary of goals of care;full note to follow:   Met with patient and Son  [name]  Patient and son still in shock over diagnosis.  They have lots of questions about start dates, types of chemo, side effects.  I assured them that Dr. Arline Asp and his team would cover all their concerns during their first visit which will be after discharge.  Patient and Son are concerned about cost of care .  We offered financial counseling support  With regard to Code status, patient has told her son to permit resuscitation.  She does not know how she feels about a change to that status even with her current diagnosis.  I told her we will respect her choice and help gather any data she needed and that was currently available.  Patient was noted to have positive urine dip.  Discussed case with residents, antibiotics to be initiated.  Patient desires to return home as soon as possible.  Is grudgingly agreeable to let PT see her to see if she needs any special conditions at home.  Patient states she has a walker that she doesn't use.    Total Time:  840-925 am  Discussed with Dr. Mathews Robinsons L. Ladona Ridgel, MD MBA The Palliative Medicine Team at [facility] Phone: [phone] Pager: [phone]

## 2012-04-15 LAB — URINE CULTURE: Colony Count: >=100000

## 2012-04-15 MED ORDER — SULFAMETHOXAZOLE-TMP DS 800-160 MG PO TABS: 1.0000 | ORAL_TABLET | Freq: Two times a day (BID) | ORAL | Status: DC

## 2012-04-15 NOTE — Progress Notes (Signed)
Md recommended to take tylenol,  after being spoken to by charge nurse.  MD will call the client's pharmacy.  Son and client verbalized understanding of discharge instructions.  IV site removed.  PICC line, none noted at assessment.

## 2012-04-16 LAB — URINE CULTURE

## 2012-04-19 ENCOUNTER — Other Ambulatory Visit: Payer: Self-pay

## 2012-04-19 MED ORDER — METOPROLOL SUCCINATE ER 100 MG PO TB24: 100.0000 mg | ORAL_TABLET | Freq: Every evening | ORAL | Status: DC

## 2012-04-19 NOTE — Telephone Encounter (Signed)
Luis Abed, MD at 03/31/2012  9:53 AM    Status: Signed                   I dictated a documentation note concerning the patient today. Please review it. It will be okay for her next visit to be in November or December of 2014, which is a 1 year followup to her hospital consult.

## 2012-04-24 ENCOUNTER — Encounter: Payer: Self-pay | Admitting: Radiation Oncology

## 2012-04-24 DIAGNOSIS — C20 Malignant neoplasm of rectum: Secondary | ICD-10-CM | POA: Insufficient documentation

## 2012-04-25 ENCOUNTER — Other Ambulatory Visit: Payer: Self-pay | Admitting: Oncology

## 2012-04-26 ENCOUNTER — Other Ambulatory Visit (HOSPITAL_BASED_OUTPATIENT_CLINIC_OR_DEPARTMENT_OTHER): Payer: Medicare Other | Admitting: Lab

## 2012-04-26 ENCOUNTER — Encounter: Payer: Self-pay | Admitting: Oncology

## 2012-04-26 ENCOUNTER — Ambulatory Visit
Admit: 2012-04-26 | Discharge: 2012-04-26 | Disposition: A | Discharge status: Home / Self Care | Payer: Medicare Other | Attending: Radiation Oncology | Admitting: Radiation Oncology

## 2012-04-26 ENCOUNTER — Ambulatory Visit (HOSPITAL_BASED_OUTPATIENT_CLINIC_OR_DEPARTMENT_OTHER): Payer: Medicare Other | Admitting: Oncology

## 2012-04-26 ENCOUNTER — Encounter: Payer: Self-pay | Admitting: Radiation Oncology

## 2012-04-26 ENCOUNTER — Ambulatory Visit: Payer: Medicare Other

## 2012-04-26 ENCOUNTER — Ambulatory Visit: Payer: Medicare Other | Admitting: Radiation Oncology

## 2012-04-26 VITALS — BP 151/65 | HR 105 | Temp 97.6°F | Resp 20 | Ht 65.0 in | Wt 102.6 lb

## 2012-04-26 VITALS — BP 127/77 | HR 92 | Temp 98.3°F | Resp 20 | Wt 102.7 lb

## 2012-04-26 DIAGNOSIS — K921 Melena: Secondary | ICD-10-CM | POA: Insufficient documentation

## 2012-04-26 DIAGNOSIS — C210 Malignant neoplasm of anus, unspecified: Secondary | ICD-10-CM | POA: Insufficient documentation

## 2012-04-26 DIAGNOSIS — Z79899 Other long term (current) drug therapy: Secondary | ICD-10-CM | POA: Insufficient documentation

## 2012-04-26 DIAGNOSIS — C211 Malignant neoplasm of anal canal: Secondary | ICD-10-CM

## 2012-04-26 DIAGNOSIS — R197 Diarrhea, unspecified: Secondary | ICD-10-CM | POA: Insufficient documentation

## 2012-04-26 HISTORY — DX: Malignant (primary) neoplasm, unspecified: C80.1

## 2012-04-26 LAB — COMPREHENSIVE METABOLIC PANEL (CC13)
ALT: <6 U/L (ref 0–55)
AST: 18 U/L (ref 5–34)
BUN: 12.2 mg/dL (ref 7.0–26.0)
Calcium: 9.2 mg/dL (ref 8.4–10.4)
Chloride: 105 mEq/L (ref 98–107)
Creatinine: 0.6 mg/dL (ref 0.6–1.1)
Total Bilirubin: 0.3 mg/dL (ref 0.20–1.20)

## 2012-04-26 LAB — CBC WITH DIFFERENTIAL/PLATELET
BASO%: 1 % (ref 0.0–2.0)
EOS%: 7.4 % — ABNORMAL HIGH (ref 0.0–7.0)
HCT: 27.6 % — ABNORMAL LOW (ref 34.8–46.6)
LYMPH%: 22.7 % (ref 14.0–49.7)
MCH: 31.1 pg (ref 25.1–34.0)
MCHC: 33 g/dL (ref 31.5–36.0)
MCV: 94.4 fL (ref 79.5–101.0)
MONO%: 11.2 % (ref 0.0–14.0)
NEUT%: 57.7 % (ref 38.4–76.8)
Platelets: 233 10*3/uL (ref 145–400)
RBC: 2.92 10*6/uL — ABNORMAL LOW (ref 3.70–5.45)
WBC: 8.3 10*3/uL (ref 3.9–10.3)

## 2012-04-26 LAB — LACTATE DEHYDROGENASE (CC13): LDH: 251 U/L — ABNORMAL HIGH (ref 125–245)

## 2012-04-26 NOTE — Progress Notes (Signed)
Checked in patient for hospital follow up. °

## 2012-04-26 NOTE — Progress Notes (Addendum)
Pt states she has "headache probably due to not eating lunch". Offered to get pt food, but she refused. Gave pt and son water. Pt lives w/son.  She states she sometimes has diarrhea, other times is constipated and occasionally has small amts blood in stool. She has hx of constipation. She states she lost weight down to 91 lbs while in hospital, has gained 16 lbs since d/c. She is drinking Ensures daily.  Reminded pt of ct sim appt today; she states she "is not even sure she wants radiation".  Explained that Dr Michell Heinrich will discuss radiation w/her and son today so pt can make best decision for herself. Pt scored 5/10 on distress screening.

## 2012-04-26 NOTE — Progress Notes (Signed)
Please see the Nurse Progress Note in the MD Initial Consult Encounter for this patient. 

## 2012-04-26 NOTE — Progress Notes (Signed)
CC:   Lurline Hare, M.D. Luis Abed, MD, [facility] Aundra Dubin, M.D. Lorne Skeens. Hoxworth, M.D.  PROBLEM LIST: 1. Squamous cell carcinoma of the anal canal, stage T2 N2, IIIB, with     biopsy of primary tumor carried out on 04/09/2012 and core Ellis     biopsies of a right inguinal lymph node on 04/12/2012.  CT     scan of the chest, abdomen, and pelvis with IV contrast carried out     on 03/16/2009 showed a right inguinal lymph node measuring 4.2 x     3.0 cm.  There were right inguinal lymph nodes, the largest of     which measured 2.2 x 2.1 cm.  Thus, if we go back to the time of     that CT scan on 03/16/2009, there was concern that the patient had     a malignant process, which has now been confirmed.  Thus, it     appears that this patient's cancer of the anal canal goes back     at least to early 2011.  CT scan of chest, abdomen, and pelvis with IV     contrast carried out on 04/13/2012 now shows a perianal mass lesion     measuring 3.9 x 5.1 cm on image 109.  The right pelvic sidewall     lymph node mass now measures 4.9 x 3.2 cm on image 92 and the right     inguinal lymph node measures 4.8 x 3.0 cm on image 103.  These     lymph nodes have increased compared with the previous CT scan from     02/02/2012. 2. Coronary artery disease. 3. History of congestive heart failure. 4. Aortic valve stenosis, moderately severe, with a gradient of 30 mm     and a valve area of 0.83 cm sq from a 2-D echocardiogram on     02/04/2012. 5. Rheumatoid arthritis, severe and deforming. 6. Interstitial lung disease. 7. Mitral regurgitation, mild to moderate. 8. Hypertension. 9. Dyslipidemia. 10. Coronary artery disease. 11. Large hiatal hernia. 12. Admission to the hospital in late December 2013 for cholecystitis     in association with E coli bacteremia.  Percutaneous gallbladder     drainage was carried out. 13. Cholelithiasis as noted on the CT scan of 04/13/2012.  There was  no     evidence for cholecystitis.  MEDICATIONS:  Reviewed and recorded. Current Outpatient Prescriptions  Medication Sig Dispense Refill  . amitriptyline (ELAVIL) 50 MG tablet Take 1 tablet (50 mg total) by mouth at bedtime.  30 tablet  0  . aspirin-acetaminophen-caffeine (EXCEDRIN EXTRA STRENGTH) 250-250-65 MG per tablet Take 2 tablets by mouth every 6 (six) hours as needed.      [name] Kitchen atorvastatin (LIPITOR) 20 MG tablet Take 20 mg by mouth every evening.       . Calcium Carbonate-Vitamin D (CALCIUM 600+D) 600-400 MG-UNIT per tablet Take 1 tablet by mouth daily.        [name] Kitchen docusate sodium 100 MG CAPS Take 100 mg by mouth 2 (two) times daily as needed.  10 capsule  0  . esomeprazole (NEXIUM) 40 MG capsule Take 1 capsule (40 mg total) by mouth daily before breakfast.  30 capsule  9  . feeding supplement (ENSURE COMPLETE) LIQD Take 237 mLs by mouth 2 (two) times daily between meals as needed (suboptimal PO intake).  14220 mL  2  . hydrocortisone (ANUSOL-HC) 2.5 % rectal cream Place 1 application rectally  every 12 (twelve) hours as needed (anal irritation). Apply to anal area  30 g  0  . leflunomide (ARAVA) 20 MG tablet Take 20 mg by mouth daily.       . metoprolol succinate (TOPROL-XL) 100 MG 24 hr tablet Take 1 tablet (100 mg total) by mouth every evening. Take with or immediately following a meal.  30 tablet  9  . Multiple Vitamins-Minerals (EYE VITAMINS PO) Take 1 tablet by mouth daily.       . potassium chloride SA (K-DUR,KLOR-CON) 20 MEQ tablet Take 1 tablet (20 mEq total) by mouth 2 (two) times daily.  15 tablet  0  . predniSONE (DELTASONE) 5 MG tablet Take 5 mg by mouth daily.         No current facility-administered medications for this visit.    SMOKING HISTORY:  The patient has never smoked cigarettes.    HISTORY:  I am seeing [name] today for the first time since her admission to the hospital from 04/06/2012 through 04/15/2012 due to passing blood per rectum.  The patient  required 1 unit of packed red cells on 04/06/2012.  She underwent workup during that admission which established the diagnosis of squamous cell carcinoma of the anal canal with biopsies of the primary lesion on 04/09/2012 and a Ellis biopsy of the right inguinal lymph node showing metastatic involvement by squamous cell carcinoma.  The patient was seen in consultation by me and by Dr. Lurline Hare.  The patient is here today for further discussion.  She is with her son, [name], who I did not have a chance to meet while in the hospital.  The patient also has an appointment see Dr. Michell Heinrich later this afternoon.  The patient apparently has done well since her discharge.  She may have gained as much as 10 pounds.  Her weight in the hospital was recorded at 92 pounds and today her weight is recorded at 102.6 pounds.  She says she is eating well, has noted perhaps some blood on the toilet paper, but has had no significant bleeding or blood loss.  She denies any major changes, except that she seems to be stronger and feels stronger than she has.  The patient lives with her son [name] in [place]. She is really without complaints today.  She denies any pain or respiratory problems.  PHYSICAL EXAMINATION:  General:  Weight is recorded at 102 pounds 9.6 ounces, height 5 feet 5 inches, body surface area 1.46 sq m.  Vital Signs:  Blood pressure 151/65.  Other vital signs are normal.  HEENT: The patient looks pale.  She is in no acute distress.  There is no scleral icterus.  Mouth and pharynx are benign.  Lymph nodes:  No peripheral adenopathy palpable in the neck, supraclavicular, axillary areas.  Lungs:  Clear.  Cardiac:  Regular rhythm with systolic ejection murmur.  Breasts:  Not examined.  Abdomen:  Benign.  She does have an enlarged right inguinal lymph node measuring 1.5-2 cm in diameter. Extremities:  There is trace edema of the legs.  The patient has severe deforming  rheumatoid arthritis, particularly involving her hands and fingers.  LABORATORY DATA:  White count 8.3, ANC 4.8, hemoglobin 9.1, hematocrit 27.6, platelets 233,000.  On 04/14/2012 hemoglobin was 10.6, hematocrit 29.9.  Back on 04/06/2012 hemoglobin was 7.6, hematocrit 23.6. Chemistries today notable for an albumin of 3.0, an LDH of 251, BUN 12, creatinine 0.6.  CEA on 04/12/2012 was 6.4, compared with 5.0  on 02/04/2012.  Ferritin was 44 and iron saturation 15% on 04/06/2012. Folate was greater than 20.  Vitamin B12 level was 297 on 04/06/2012.  IMAGING STUDIES: 1. CT scans of chest, abdomen, and pelvis with IV contrast were     carried out on 03/16/2009.  There was intrathoracic stomach showing     no evidence for obstruction due to a large hiatal hernia.  There     was at least 1 calcified gallstone measuring 1.5 cm.  Liver was     normal, as were pancreas and kidneys.  Abdominal aorta was     calcified without aneurysm.  There were colonic diverticula.  There     was a right iliac lymph node measuring 4.2 x 3.0 cm.  There were     right inguinal lymph nodes, the largest of which measured 2.2 x 2.1     cm. 2. CT scans of chest, abdomen, and pelvis without IV contrast on     02/02/2012 again showed a large 1.5 x 1.7-cm stone within the neck     of a distended gallbladder with some gallbladder wall thickening.     A large hiatal hernia was noted.  There was a right external iliac     pelvic sidewall lymphadenopathy, had increased compared with the CT     scans from 03/16/2009.  Lymph node measured 4.6 x 2.4 cm, whereas     previously it measured 4.3 x 2.3 cm.  Right inguinal     lymphadenopathy measured 4.1 x 2.5 cm.  There was no definite     retroperitoneal or mesenteric adenopathy on this noncontrast     examination. 3. CT scan of chest, abdomen, and pelvis with IV contrast on     04/13/2012 showed very large hiatal hernia.  There was coronary and     aortic atherosclerosis.  A  perianal mass lesion was noted measuring     3.9 x 5.1 cm on image 109.  Right pelvic sidewall lymph node mass     measured 4.9 x 3.2 cm on image 92.  A right inguinal lymph node     mass measured 4.8 x 3.0 cm on image 103.  Gallstones were noted     without evidence of cholecystitis.  IMPRESSION AND PLAN:  Today's session was rather lengthy, lasting about an hour.  I was meeting the patient's son, [name], for the first time. The patient and her son are somewhat ambivalent about treatment.  There is great concern about side effects and toxicities of treatments and how well the patient, who is 46, looks frail and has some serious cardiac issues as well as severe deforming rheumatoid arthritis.  The patient and her son have an appointment later today with Dr. Michell Heinrich. Dr. Michell Heinrich and I have discussed modifications of the usual standard treatment for squamous cell carcinoma of the anal canal which involves prolonged course of radiation treatments in combination with 2 doses of IV mitomycin C separated by 4 weeks, as well as 5-FU by continuous infusion over 4 days, also separated x 1 month.  Modifications of the chemotherapy plan could include substituting capecitabine (Xeloda) for the 5-FU infusion and also modifying the dose of mitomycin C. Modifications of the radiation plan could include split course or a short course of radiation, perhaps 2 or 3 weeks rather than 5 or 6 weeks.  In general, options for treatment or non-treatment for Mrs. Benevides would include:  1. No treatment at this time and careful observation.  The patient and     her son understand that recurrent bleeding from the primary anal     lesion may be a problem and she may need blood transfusions.  Also,     that the disease is likely to progress as it has been doing.  On     the other hand, it appears that the patient has had this cancer in     a locally advanced stage now for over 3 years. 2. Option #2 would  involve radiation as a single modality. 3. Option #3 would include radiation and chemotherapy treatment in     some sort of modified approach as discussed above.  No decision was made today.  As stated, the patient and her son will discuss the situation with Dr. Michell Heinrich and then she and I will confer.  I have scheduled the patient to see me again in about 3 weeks, which should be around April 10th, at which time we will check CBC, chemistries, and iron studies.  I have also suggested to the patient that she can take some over-the-counter iron preparation since she did have borderline iron studies carried out a couple of weeks ago when she was hospitalized.    ______________________________ Samul Dada, M.D. DSM/MEDQ  D:  04/26/2012  T:  04/26/2012  Job:  [id number]

## 2012-04-26 NOTE — Progress Notes (Signed)
This office note has been dictated.  #[id number]

## 2012-04-27 NOTE — Progress Notes (Signed)
Department of Radiation Oncology  Phone:  [phone] Fax:        [phone]   Name: [name] MRN: [id number]  DOB: 1925-12-23  Date: 04/27/2012  Follow Up Visit Note  Diagnosis: T2N2 Anal Cancer  Interval History: [name] presents today for routine followup.  Since being discharged from the hospital she has gained about 16 pounds by drinking boost. She is accompanied by her son today. They have seen medical oncology and discuss the issues regarding treatment. She continues to have diarrhea and small amounts of blood in her stool. She is still unsure as to whether she would like to pursue treatment. She is not having any pain.  Allergies: No Known Allergies  Medications:  Current Outpatient Prescriptions  Medication Sig Dispense Refill  . amitriptyline (ELAVIL) 50 MG tablet Take 1 tablet (50 mg total) by mouth at bedtime.  30 tablet  0  . aspirin-acetaminophen-caffeine (EXCEDRIN EXTRA STRENGTH) 250-250-65 MG per tablet Take 2 tablets by mouth every 6 (six) hours as needed.      [name] Kitchen atorvastatin (LIPITOR) 20 MG tablet Take 20 mg by mouth every evening.       . Calcium Carbonate-Vitamin D (CALCIUM 600+D) 600-400 MG-UNIT per tablet Take 1 tablet by mouth daily.        [name] Kitchen docusate sodium 100 MG CAPS Take 100 mg by mouth 2 (two) times daily as needed.  10 capsule  0  . feeding supplement (ENSURE COMPLETE) LIQD Take 237 mLs by mouth 2 (two) times daily between meals as needed (suboptimal PO intake).  14220 mL  2  . hydrocortisone (ANUSOL-HC) 2.5 % rectal cream Place 1 application rectally every 12 (twelve) hours as needed (anal irritation). Apply to anal area  30 g  0  . leflunomide (ARAVA) 20 MG tablet Take 20 mg by mouth daily.       . metoprolol succinate (TOPROL-XL) 100 MG 24 hr tablet Take 1 tablet (100 mg total) by mouth every evening. Take with or immediately following a meal.  30 tablet  9  . Multiple Vitamins-Minerals (EYE VITAMINS PO) Take 1 tablet by mouth daily.         . potassium chloride SA (K-DUR,KLOR-CON) 20 MEQ tablet Take 1 tablet (20 mEq total) by mouth 2 (two) times daily.  15 tablet  0  . predniSONE (DELTASONE) 5 MG tablet Take 5 mg by mouth daily.        [name] Kitchen esomeprazole (NEXIUM) 40 MG capsule Take 1 capsule (40 mg total) by mouth daily before breakfast.  30 capsule  9   No current facility-administered medications for this encounter.    Physical Exam:  Filed Vitals:   04/26/12 1345  BP: 127/77  Pulse: 92  Temp: 98.3 F (36.8 C)  Resp: 20   she is a pleasant female in no distress sitting comfortably in examining table.  IMPRESSION: [name] is a 79 y.o. female with a locally advanced anal cancer and recent hospitalization for massive rectal bleeding  PLAN:  I spoke with Ms. Mervin and her son regarding her diagnosis and options for treatment. We discussed no treatment and the eventual bowel obstruction and sudden death it may occur as a result of complete closure of the anorectal orifice. We did also discuss the pain associated with uncontrolled rectal tumors. We discussed palliative treatment for 2 weeks without chemotherapy just to see how she handled things. We discussed the sensitivity of squamous cell carcinomas of the anus to radiation. We discussed curative chemoradiotherapy with  the expected side effects of skin desquamation, fatigue, nausea vomiting and diarrhea. We discussed that 6 weeks of treatment with chemotherapy was really the only way to cure this disease other than a very morbid surgery that she is not candidate for. I emphasized with her son and with the patient that these are difficult options and neither sounds like a long-term solution without significant side effects. We discussed that my goal is for her to remain out of the hospital is much as possible and to continue her functional life with his many disruptions as possible. Unfortunately I think with treatment of this tumor her pain might increase before it decreases due to  death of tumor cells in exposure of underlying nerves. I do feel as though she is at risk for another rectal bleed as well as a bowel obstruction if this tumor continues to grow. They do not feel comfortable proceeding on with simulation today. They would like to give her another couple weeks to improve her nutrition. I've scheduled her for simulation next Tuesday with the thought that we would attempt to weeks of palliative radiation and reassess at that time how she was feeling and how she was tolerating treatment. I gave them the option of hospice and palliative care. They preferred to move on with treatment at this time.    Lurline Hare, MD

## 2012-05-02 ENCOUNTER — Other Ambulatory Visit: Payer: Self-pay | Admitting: Medical Oncology

## 2012-05-02 ENCOUNTER — Encounter: Payer: Self-pay | Admitting: Radiation Oncology

## 2012-05-02 ENCOUNTER — Ambulatory Visit
Admission: RE | Admit: 2012-05-02 | Discharge: 2012-05-02 | Disposition: A | Discharge status: Home / Self Care | Payer: Medicare Other | Source: Ambulatory Visit | Attending: Radiation Oncology | Admitting: Radiation Oncology

## 2012-05-02 VITALS — BP 140/61 | HR 92 | Temp 98.0°F | Resp 20

## 2012-05-02 DIAGNOSIS — R630 Anorexia: Secondary | ICD-10-CM | POA: Insufficient documentation

## 2012-05-02 DIAGNOSIS — Z510 Encounter for antineoplastic radiation therapy: Secondary | ICD-10-CM | POA: Insufficient documentation

## 2012-05-02 DIAGNOSIS — C20 Malignant neoplasm of rectum: Secondary | ICD-10-CM

## 2012-05-02 DIAGNOSIS — R197 Diarrhea, unspecified: Secondary | ICD-10-CM | POA: Insufficient documentation

## 2012-05-02 DIAGNOSIS — C210 Malignant neoplasm of anus, unspecified: Secondary | ICD-10-CM | POA: Insufficient documentation

## 2012-05-02 DIAGNOSIS — R112 Nausea with vomiting, unspecified: Secondary | ICD-10-CM | POA: Insufficient documentation

## 2012-05-02 DIAGNOSIS — R5381 Other malaise: Secondary | ICD-10-CM | POA: Insufficient documentation

## 2012-05-02 MED ORDER — SODIUM CHLORIDE 0.9 % IJ SOLN
10.0000 mL | Freq: Once | INTRAMUSCULAR | Status: AC
  Administered 2012-05-02: 10 mL via INTRAVENOUS

## 2012-05-02 NOTE — Progress Notes (Signed)
Removed iv catheter lac  site, 2 x 2 gause placed over and taped, held pressure 2 minutes, patient tolerated well, ptaient taken via w/c to son in waiting area room, instuctions given to patient and son michael to leave dressing on for the afternoon then can remove, if no bleeding,

## 2012-05-02 NOTE — Progress Notes (Signed)
Patient gave name and dob as identification, nott diabetic, not allergic to sulfa/IV dye, Iv started left ac x 1 attempt with 22G 1In ctheter needle, excellent blood return, patient tolerated well, no c/o pain"That didn't hurt hardly any", taped opsite over site, flushed with 10cc normal saline, called ct sim ,patient ready for Ct Sim ,blanket offered to patient,water offered. 10:37 AM

## 2012-05-10 ENCOUNTER — Ambulatory Visit
Admission: RE | Admit: 2012-05-10 | Discharge: 2012-05-10 | Disposition: A | Discharge status: Home / Self Care | Payer: Medicare Other | Source: Ambulatory Visit | Attending: Radiation Oncology | Admitting: Radiation Oncology

## 2012-05-10 ENCOUNTER — Encounter: Payer: Self-pay | Admitting: Radiation Oncology

## 2012-05-11 ENCOUNTER — Ambulatory Visit
Admission: RE | Admit: 2012-05-11 | Discharge: 2012-05-11 | Disposition: A | Discharge status: Home / Self Care | Payer: Medicare Other | Source: Ambulatory Visit | Attending: Radiation Oncology | Admitting: Radiation Oncology

## 2012-05-12 ENCOUNTER — Ambulatory Visit
Admission: RE | Admit: 2012-05-12 | Discharge: 2012-05-12 | Disposition: A | Discharge status: Home / Self Care | Payer: Medicare Other | Source: Ambulatory Visit | Attending: Radiation Oncology | Admitting: Radiation Oncology

## 2012-05-15 ENCOUNTER — Ambulatory Visit
Admission: RE | Admit: 2012-05-15 | Discharge: 2012-05-15 | Disposition: A | Discharge status: Home / Self Care | Payer: Medicare Other | Source: Ambulatory Visit | Attending: Radiation Oncology | Admitting: Radiation Oncology

## 2012-05-16 ENCOUNTER — Ambulatory Visit: Payer: Medicare Other

## 2012-05-16 ENCOUNTER — Telehealth: Payer: Self-pay | Admitting: *Deleted

## 2012-05-16 NOTE — Telephone Encounter (Signed)
Per son's request to radiation therapist on Linac #2, called pt to check on her status. Son had called and cancelled pt's treatment today stating she "is sick and very weak". No answer, no voice mail set up. Will try to reach pt later today.

## 2012-05-16 NOTE — Telephone Encounter (Signed)
Spoke w/pt's son, [name] who states his mother "felt too weak and sick to come in this morning". He states she had some nausea and diarrhea, took Imodium. He states she "feels better now and will try to come for treatment tomorrow". Advised son to call this office if any further problems or issues for pt. He verbalized thanks for call and understanding.

## 2012-05-17 ENCOUNTER — Ambulatory Visit: Admission: RE | Admit: 2012-05-17 | Payer: Medicare Other | Source: Ambulatory Visit | Admitting: Radiation Oncology

## 2012-05-17 ENCOUNTER — Encounter: Payer: Self-pay | Admitting: Radiation Oncology

## 2012-05-17 ENCOUNTER — Ambulatory Visit
Admission: RE | Admit: 2012-05-17 | Discharge: 2012-05-17 | Disposition: A | Discharge status: Home / Self Care | Payer: Medicare Other | Source: Ambulatory Visit | Attending: Radiation Oncology | Admitting: Radiation Oncology

## 2012-05-17 DIAGNOSIS — C210 Malignant neoplasm of anus, unspecified: Secondary | ICD-10-CM

## 2012-05-17 NOTE — Progress Notes (Signed)
   Weekly Management Note: Outpatient, Anal Cancer  Current Dose:  10 Gy  Projected Dose: To be determined based on patient tolerance  Narrative:  The patient presents for routine under treatment assessment.  CBCT/MVCT images/Port film x-rays were reviewed.  The chart was checked. She has diarrhea yesterday, better after Immodium x1.  No nausea, lightheadness, or vomiting. She is fatigued, weak - this kept her from attending RT yesterday.  She is seeing nutritionist and ate this AM.    Physical Findings: Vitals with Age-Percentiles 05/17/2012  Length   Systolic 123  Diastolic 62  Pulse 100  Respiration 20  Weight 47.219 kg  VISIT REPORT    No distress. Sitting in Wheelchair. Non toxic appearing.  Impression:  The patient is tolerating radiotherapy.  Plan:  Continue radiotherapy as planned.  ________________________________   Lonie Peak, M.D.

## 2012-05-17 NOTE — Progress Notes (Signed)
Pt cancelled her radiation tx yesterday due to weakness, fatigue. She states she feels better today. Pt has received radiation today. She states she had diarrhea yesterday, unsure of how many episodes. She took Imodium x 1 w/good relief. She denies pain, nausea. She reports fatigue, weakness, loss of appetite. Pt's son states these issues began w/her hospitalization for gall bladder problems. Pt has eaten breakfast today, is seeing nutritionist.  Post sim ed completed w/pt and son; documented under pt education appt.

## 2012-05-17 NOTE — Progress Notes (Signed)
Post sim ed completed w/pt and son. Gave pt 'Radiation and You" booklet w/all pertinent information marked and discussed, re: fatigue, diarrhea management, n/v management, nutrition, pain, skin irritation/care. Pt is seeing dietician. All questions answered. Son given date, time, address and phone # of pt's appt on 05/19/12 w/Dr Ezzard Standing.

## 2012-05-18 ENCOUNTER — Ambulatory Visit
Admission: RE | Admit: 2012-05-18 | Discharge: 2012-05-18 | Disposition: A | Discharge status: Home / Self Care | Payer: Medicare Other | Source: Ambulatory Visit | Attending: Radiation Oncology | Admitting: Radiation Oncology

## 2012-05-19 ENCOUNTER — Ambulatory Visit
Admission: RE | Admit: 2012-05-19 | Discharge: 2012-05-19 | Disposition: A | Discharge status: Home / Self Care | Payer: Medicare Other | Source: Ambulatory Visit | Attending: Radiation Oncology | Admitting: Radiation Oncology

## 2012-05-19 ENCOUNTER — Telehealth: Payer: Self-pay | Admitting: Dietician

## 2012-05-19 ENCOUNTER — Encounter (INDEPENDENT_AMBULATORY_CARE_PROVIDER_SITE_OTHER): Payer: Medicare Other | Admitting: Surgery

## 2012-05-19 NOTE — Telephone Encounter (Signed)
Brief Outpatient Oncology Nutrition Note  Patient has been identified to be at risk on malnutrition screen.   Wt Readings from Last 10 Encounters:  05/17/12 104 lb 1.6 oz (47.219 kg)  04/26/12 102 lb 11.2 oz (46.584 kg)  04/26/12 102 lb 9.6 oz (46.539 kg)  04/15/12 91 lb 7.9 oz (41.5 kg)  04/15/12 91 lb 7.9 oz (41.5 kg)  03/15/12 100 lb (45.36 kg)  02/08/12 101 lb 3.1 oz (45.9 kg)  02/02/12 103 lb 3.2 oz (46.811 kg)  12/29/10 110 lb (49.896 kg)  11/20/09 114 lb (51.71 kg)    Chart reviewed.  Called and spoke with patient's son.  Patient is not eating well- mostly grazing and taking 1 bottle Ensure per day.  Poor appetite and diarrhea.  Weight has increased but patient feels that this is fluid.  Discussed ways to increase calories and protein.  Encouraged 6-8 small meals daily and increase use of Ensure.  Will send patient coupons for Ensure and Handouts:  "Making the Most of Each Bite" and "Diarrhea".  Encouraged them to call if any questions or if she would like to see the [facility].  Oran Rein, RD, LDN

## 2012-05-22 ENCOUNTER — Telehealth: Payer: Self-pay | Admitting: Oncology

## 2012-05-22 ENCOUNTER — Other Ambulatory Visit: Payer: Self-pay | Admitting: Radiation Oncology

## 2012-05-22 ENCOUNTER — Telehealth: Payer: Self-pay | Admitting: *Deleted

## 2012-05-22 ENCOUNTER — Ambulatory Visit: Payer: Medicare Other

## 2012-05-22 DIAGNOSIS — C210 Malignant neoplasm of anus, unspecified: Secondary | ICD-10-CM

## 2012-05-22 MED ORDER — ONDANSETRON HCL 8 MG PO TABS: 8.0000 mg | ORAL_TABLET | Freq: Three times a day (TID) | ORAL | Status: DC | PRN

## 2012-05-22 NOTE — Telephone Encounter (Addendum)
[name], pt's son and informed him dr sent e script for Zofran to [facility]. Advised him to have pt push fluids, and that dr wants pt to receive IVF tomorrow. Per Melissa, infusion scheduler, pt is currently scheduled to receive IVF tomorrow at 11:00 am. Addendum/correction: Med onc scheduler's name is Marcelino Duster.

## 2012-05-22 NOTE — Telephone Encounter (Signed)
Left vm w/call back name and number re: message that pt unable to come in for radiation tx today due to diarrhea.

## 2012-05-22 NOTE — Telephone Encounter (Signed)
s.w. son and advisedon 4.17.14 appt....ok

## 2012-05-22 NOTE — Telephone Encounter (Signed)
Received return call from pt's son, [name] who states pt had diarrhea yesterday and last night, took Imodium x 4 doses. He states "it has slowed down this morning". Pt experiencing occasional bouts of nausea, no vomiting. Pt does not have script for antiemetic. Informed son will route a request to dr for med to relieve pt's nausea. Son states she was able to eat and drink a little yesterday. He states "she is weak and her ankles are swelling". Pt states she has hx CHF and ankle swelling. Advised son that pt needs to be seen by dr tomorrow whether or not she receives radiation treatment. Son states he "will do his best to bring her in".

## 2012-05-23 ENCOUNTER — Ambulatory Visit (HOSPITAL_BASED_OUTPATIENT_CLINIC_OR_DEPARTMENT_OTHER): Payer: Medicare Other

## 2012-05-23 ENCOUNTER — Other Ambulatory Visit: Payer: Self-pay | Admitting: *Deleted

## 2012-05-23 ENCOUNTER — Ambulatory Visit
Admission: RE | Admit: 2012-05-23 | Discharge: 2012-05-23 | Disposition: A | Discharge status: Home / Self Care | Payer: Medicare Other | Source: Ambulatory Visit | Attending: Radiation Oncology | Admitting: Radiation Oncology

## 2012-05-23 ENCOUNTER — Ambulatory Visit: Payer: Medicare Other | Admitting: Nutrition

## 2012-05-23 ENCOUNTER — Encounter: Payer: Self-pay | Admitting: Radiation Oncology

## 2012-05-23 VITALS — BP 80/50 | HR 88 | Temp 97.5°F

## 2012-05-23 VITALS — BP 105/59 | HR 97 | Temp 98.1°F | Resp 20 | Wt 103.3 lb

## 2012-05-23 DIAGNOSIS — R5381 Other malaise: Secondary | ICD-10-CM

## 2012-05-23 DIAGNOSIS — C210 Malignant neoplasm of anus, unspecified: Secondary | ICD-10-CM

## 2012-05-23 DIAGNOSIS — R110 Nausea: Secondary | ICD-10-CM

## 2012-05-23 DIAGNOSIS — C211 Malignant neoplasm of anal canal: Secondary | ICD-10-CM

## 2012-05-23 MED ORDER — SODIUM CHLORIDE 0.9 % IV SOLN: 1000.0000 mL | INTRAVENOUS | Status: DC

## 2012-05-23 MED ORDER — LORAZEPAM 0.5 MG PO TABS
0.5000 mg | ORAL_TABLET | Freq: Once | ORAL | Status: AC
  Administered 2012-05-23: 0.5 mg via ORAL
  Filled 2012-05-23: qty 1

## 2012-05-23 MED ORDER — SODIUM CHLORIDE 0.9 % IV SOLN
500.0000 mL | Freq: Once | INTRAVENOUS | Status: AC
  Administered 2012-05-23: 09:00:00 via INTRAVENOUS

## 2012-05-23 NOTE — Progress Notes (Addendum)
Pt requests to see Dr Michell Heinrich prior to tx today. She is scheduled for IVF today. Son w/pt; pt states she last had diarrhea yesterday morning, has only eaten crackers, drinking water. She took Imodium prn. She c/o nausea, occasional dry heaves. Took Zofran last night, another dose this morning at 7 am. She states she is still nauseated, c/o her "stomach hurts", denies cramping but states "it just hurts". She states it comes and goes but has persisted past few days. She is unsure if she has had blood in stools, states "I can't see well." Pt c/o weakness, mild HA, mild dizziness this morning.   Per Dr Michell Heinrich, gave pt Ativan 0.5mg  po. Per Felicita Gage RN pt can come to infusion room for IVF after receiving Ativan. Pt to receive 500 ml NS per Dr Michell Heinrich, orders in. Pt transported in wheelchair by her son to infusion area.  Per Dr Michell Heinrich pt on radiation treatment break this week, will call to check on her status next Mon re: pt ready to resume radiation tx. Heather, RT on Linac #2 notified.

## 2012-05-23 NOTE — Progress Notes (Signed)
Weekly Management Note Current Dose:15 Gy  Projected Dose:25 Gy   Narrative:  I saw [name] and her son today. He brought her in prior to treatment for assessment and possible IV fluids. Per his report she has had nothing to eat or drink for 3 days. She is also been having diarrhea. There using Imodium. She says that it still continues. She also had some nausea. She took Zofran last night and they really were unclear whether that helped or not. She continues to have dry heaves. Overall she said she just feels bad. She would like to proceed on with treatment but does not feel she can at this time secondary to her side effects.  Physical Findings:  Unchanged. She is alert and a wheelchair. She is not orthostatic. Her blood pressure when sitting is 109/58 with a heart rate of 94.  Vitals:  Filed Vitals:   05/23/12 0806  BP: 105/59  Pulse: 97  Temp:   Resp: 20   Weight:  Wt Readings from Last 3 Encounters:  05/23/12 103 lb 4.8 oz (46.857 kg)  05/17/12 104 lb 1.6 oz (47.219 kg)  04/26/12 102 lb 11.2 oz (46.584 kg)   Lab Results  Component Value Date   WBC 8.3 04/26/2012   HGB 9.1* 04/26/2012   HCT 27.6* 04/26/2012   MCV 94.4 04/26/2012   PLT 233 04/26/2012   Lab Results  Component Value Date   CREATININE 0.6 04/26/2012   BUN 12.2 04/26/2012   NA 142 04/26/2012   K 3.8 04/26/2012   CL 105 04/26/2012   CO2 25 04/26/2012     Impression:  The patient requested a break from radiation with possible treatment discontinuance.  Plan:  [name] has struggled through her course of treatment. She is received 6 fractions in 9 days. Her son requested IV fluids today. I cautioned him that she was not really orthostatic that he is insistent that she hasn't had anything to eat or drink for 3 days and cannot orally rehydrate. I will give her 500 cc of normal saline today. She is going to check in with Dr. Arline Asp on Thursday. We will call her on Friday or Monday to determine whether she would like to  resume treatment or just discontinue it completely. If she does elect to do that we would refer her to hospice.

## 2012-05-23 NOTE — Progress Notes (Signed)
This patient is an 79 year old female diagnosed with anal cancer.  Past medical history includes CAD, dyslipidemia, CHF, hypertension, depression, and GERD.  Medications include calcium with vitamin D, Imodium, multivitamin, Zofran, prednisone, and K-Dur.  Labs include an albumin of 3.0 on March 19.  Height: 65 inches. Weight: 103.3 on April 15. Usual body weight: 110 pounds in 2012. BMI: 17.19  I spoke with patient today while she was receiving IV fluids. Her son was at her side. Patient reports diarrhea is ongoing. She reports she has had nausea in the past but states that has improved today. Patient is unable to provide dietary recall of food she typically eats. Patient states she will drink an ensure but it takes her all day. Per son, it is difficult to get patient to eat much of anything.  Nutrition diagnosis: Inadequate oral intake related to diagnosis of anal cancer and associated treatments as evidenced by 6% weight loss from usual body weight.  Intervention: I attempted to educate patient on increasing oral intake in small frequent meals throughout the day. I have provided specific instructions on foods patient could consume to help improve stools. I recommended that patient increase Ensure Plus to twice a day to 3 times a day. I provided fact sheets on nausea, vomiting and diarrhea for patient to refer to at home. Patient is not receptive to nutrition suggestions but states she will try.  Monitoring, evaluation, goals: Patient will tolerate increased oral intake and fluid intake to increase quality-of-life.  Next visit: Patient has my contact information for questions or concerns.

## 2012-05-23 NOTE — Patient Instructions (Addendum)
Dehydration, Adult Dehydration is when you lose more fluids from the body than you take in. Vital organs like the kidneys, brain, and heart cannot function without a proper amount of fluids and salt. Any loss of fluids from the body can cause dehydration.  CAUSES   Vomiting.  Diarrhea.  Excessive sweating.  Excessive urine output.  Fever. SYMPTOMS  Mild dehydration  Thirst.  Dry lips.  Slightly dry mouth. Moderate dehydration  Very dry mouth.  Sunken eyes.  Skin does not bounce back quickly when lightly pinched and released.  Dark urine and decreased urine production.  Decreased tear production.  Headache. Severe dehydration  Very dry mouth.  Extreme thirst.  Rapid, weak pulse (more than 100 beats per minute at rest).  Cold hands and feet.  Not able to sweat in spite of heat and temperature.  Rapid breathing.  Blue lips.  Confusion and lethargy.  Difficulty being awakened.  Minimal urine production.  No tears. DIAGNOSIS  Your caregiver will diagnose dehydration based on your symptoms and your exam. Blood and urine tests will help confirm the diagnosis. The diagnostic evaluation should also identify the cause of dehydration. TREATMENT  Treatment of mild or moderate dehydration can often be done at home by increasing the amount of fluids that you drink. It is best to drink small amounts of fluid more often. Drinking too much at one time can make vomiting worse. Refer to the home care instructions below. Severe dehydration needs to be treated at the hospital where you will probably be given intravenous (IV) fluids that contain water and electrolytes. HOME CARE INSTRUCTIONS   Ask your caregiver about specific rehydration instructions.  Drink enough fluids to keep your urine clear or pale yellow.  Drink small amounts frequently if you have nausea and vomiting.  Eat as you normally do.  Avoid:  Foods or drinks high in sugar.  Carbonated  drinks.  Juice.  Extremely hot or cold fluids.  Drinks with caffeine.  Fatty, greasy foods.  Alcohol.  Tobacco.  Overeating.  Gelatin desserts.  Wash your hands well to avoid spreading bacteria and viruses.  Only take over-the-counter or prescription medicines for pain, discomfort, or fever as directed by your caregiver.  Ask your caregiver if you should continue all prescribed and over-the-counter medicines.  Keep all follow-up appointments with your caregiver. SEEK MEDICAL CARE IF:  You have abdominal pain and it increases or stays in one area (localizes).  You have a rash, stiff neck, or severe headache.  You are irritable, sleepy, or difficult to awaken.  You are weak, dizzy, or extremely thirsty. SEEK IMMEDIATE MEDICAL CARE IF:   You are unable to keep fluids down or you get worse despite treatment.  You have frequent episodes of vomiting or diarrhea.  You have blood or green matter (bile) in your vomit.  You have blood in your stool or your stool looks black and tarry.  You have not urinated in 6 to 8 hours, or you have only urinated a small amount of very dark urine.  You have a fever.  You faint. MAKE SURE YOU:   Understand these instructions.  Will watch your condition.  Will get help right away if you are not doing well or get worse. Document Released: 01/25/2005 Document Revised: 04/19/2011 Document Reviewed: 09/14/2010 [employer] Patient Information 2013 [employer].  

## 2012-05-24 ENCOUNTER — Encounter (HOSPITAL_COMMUNITY): Payer: Self-pay | Admitting: Emergency Medicine

## 2012-05-24 ENCOUNTER — Other Ambulatory Visit: Payer: Self-pay

## 2012-05-24 ENCOUNTER — Emergency Department (HOSPITAL_COMMUNITY): Payer: Medicare Other

## 2012-05-24 ENCOUNTER — Emergency Department (HOSPITAL_COMMUNITY)
Admission: EM | Admit: 2012-05-24 | Discharge: 2012-05-24 | Disposition: A | Discharge status: Home / Self Care | Payer: Medicare Other | Attending: Emergency Medicine | Admitting: Emergency Medicine

## 2012-05-24 ENCOUNTER — Telehealth: Payer: Self-pay

## 2012-05-24 ENCOUNTER — Ambulatory Visit: Payer: Medicare Other

## 2012-05-24 DIAGNOSIS — Z9089 Acquired absence of other organs: Secondary | ICD-10-CM | POA: Insufficient documentation

## 2012-05-24 DIAGNOSIS — E860 Dehydration: Secondary | ICD-10-CM

## 2012-05-24 DIAGNOSIS — I509 Heart failure, unspecified: Secondary | ICD-10-CM | POA: Insufficient documentation

## 2012-05-24 DIAGNOSIS — I10 Essential (primary) hypertension: Secondary | ICD-10-CM | POA: Insufficient documentation

## 2012-05-24 DIAGNOSIS — R1084 Generalized abdominal pain: Secondary | ICD-10-CM | POA: Insufficient documentation

## 2012-05-24 DIAGNOSIS — Z9861 Coronary angioplasty status: Secondary | ICD-10-CM | POA: Insufficient documentation

## 2012-05-24 DIAGNOSIS — R197 Diarrhea, unspecified: Secondary | ICD-10-CM | POA: Insufficient documentation

## 2012-05-24 DIAGNOSIS — IMO0002 Reserved for concepts with insufficient information to code with codable children: Secondary | ICD-10-CM | POA: Insufficient documentation

## 2012-05-24 DIAGNOSIS — C20 Malignant neoplasm of rectum: Secondary | ICD-10-CM | POA: Insufficient documentation

## 2012-05-24 DIAGNOSIS — Z8669 Personal history of other diseases of the nervous system and sense organs: Secondary | ICD-10-CM | POA: Insufficient documentation

## 2012-05-24 DIAGNOSIS — Z862 Personal history of diseases of the blood and blood-forming organs and certain disorders involving the immune mechanism: Secondary | ICD-10-CM | POA: Insufficient documentation

## 2012-05-24 DIAGNOSIS — Z87828 Personal history of other (healed) physical injury and trauma: Secondary | ICD-10-CM | POA: Insufficient documentation

## 2012-05-24 DIAGNOSIS — E785 Hyperlipidemia, unspecified: Secondary | ICD-10-CM | POA: Insufficient documentation

## 2012-05-24 DIAGNOSIS — R109 Unspecified abdominal pain: Secondary | ICD-10-CM

## 2012-05-24 DIAGNOSIS — Z8709 Personal history of other diseases of the respiratory system: Secondary | ICD-10-CM | POA: Insufficient documentation

## 2012-05-24 DIAGNOSIS — Z79899 Other long term (current) drug therapy: Secondary | ICD-10-CM | POA: Insufficient documentation

## 2012-05-24 DIAGNOSIS — R011 Cardiac murmur, unspecified: Secondary | ICD-10-CM | POA: Insufficient documentation

## 2012-05-24 DIAGNOSIS — M069 Rheumatoid arthritis, unspecified: Secondary | ICD-10-CM | POA: Insufficient documentation

## 2012-05-24 DIAGNOSIS — R079 Chest pain, unspecified: Secondary | ICD-10-CM | POA: Insufficient documentation

## 2012-05-24 DIAGNOSIS — K219 Gastro-esophageal reflux disease without esophagitis: Secondary | ICD-10-CM | POA: Insufficient documentation

## 2012-05-24 DIAGNOSIS — I2510 Atherosclerotic heart disease of native coronary artery without angina pectoris: Secondary | ICD-10-CM | POA: Insufficient documentation

## 2012-05-24 DIAGNOSIS — Z8679 Personal history of other diseases of the circulatory system: Secondary | ICD-10-CM | POA: Insufficient documentation

## 2012-05-24 DIAGNOSIS — Z8719 Personal history of other diseases of the digestive system: Secondary | ICD-10-CM | POA: Insufficient documentation

## 2012-05-24 LAB — COMPREHENSIVE METABOLIC PANEL
ALT: <5 U/L (ref 0–35)
AST: 15 U/L (ref 0–37)
Alkaline Phosphatase: 97 U/L (ref 39–117)
GFR calc Af Amer: >90 mL/min (ref >90)
Glucose, Bld: 100 mg/dL — ABNORMAL HIGH (ref 70–99)
Potassium: 3.3 mEq/L — ABNORMAL LOW (ref 3.5–5.1)
Sodium: 133 mEq/L — ABNORMAL LOW (ref 135–145)
Total Protein: 5.5 g/dL — ABNORMAL LOW (ref 6.0–8.3)

## 2012-05-24 LAB — CBC WITH DIFFERENTIAL/PLATELET
Basophils Absolute: 0 10*3/uL (ref 0.0–0.1)
Eosinophils Absolute: 0.3 10*3/uL (ref 0.0–0.7)
Lymphocytes Relative: 8 % — ABNORMAL LOW (ref 12–46)
Lymphs Abs: 0.4 10*3/uL — ABNORMAL LOW (ref 0.7–4.0)
MCH: 29.2 pg (ref 26.0–34.0)
Neutrophils Relative %: 72 % (ref 43–77)
Platelets: 157 10*3/uL (ref 150–400)
RBC: 3.08 MIL/uL — ABNORMAL LOW (ref 3.87–5.11)
RDW: 15.4 % (ref 11.5–15.5)
WBC: 5.4 10*3/uL (ref 4.0–10.5)

## 2012-05-24 LAB — URINALYSIS, ROUTINE W REFLEX MICROSCOPIC
Glucose, UA: NEGATIVE mg/dL
Hgb urine dipstick: NEGATIVE
Leukocytes, UA: NEGATIVE
Protein, ur: NEGATIVE mg/dL
Specific Gravity, Urine: 1.024 (ref 1.005–1.030)
pH: 5.5 (ref 5.0–8.0)

## 2012-05-24 MED ORDER — SODIUM CHLORIDE 0.9 % IV BOLUS (SEPSIS)
1000.0000 mL | Freq: Once | INTRAVENOUS | Status: AC
  Administered 2012-05-24: 1000 mL via INTRAVENOUS

## 2012-05-24 MED ORDER — ONDANSETRON HCL 4 MG/2ML IJ SOLN
4.0000 mg | Freq: Once | INTRAMUSCULAR | Status: AC
  Administered 2012-05-24: 4 mg via INTRAVENOUS
  Filled 2012-05-24: qty 2

## 2012-05-24 MED ORDER — ONDANSETRON 8 MG PO TBDP: ORAL_TABLET | ORAL | Status: DC

## 2012-05-24 MED ORDER — SODIUM CHLORIDE 0.9 % IV BOLUS (SEPSIS)
500.0000 mL | Freq: Once | INTRAVENOUS | Status: AC
  Administered 2012-05-24: 500 mL via INTRAVENOUS

## 2012-05-24 NOTE — ED Notes (Signed)
Pt states that she has been having chest pain and rt flank pain, nausea x 4 days.  Pt has been on radiation for rectal/lymph node cancer.

## 2012-05-24 NOTE — ED Notes (Signed)
[id number]<AV> Expected date:<BR> Expected time:<BR> Means of arrival:<BR> Comments:<BR> Hold for triage

## 2012-05-24 NOTE — Telephone Encounter (Signed)
Husband called saying that pt is in a lot of pain and been "sick" for about 3 days. The pain is in the same place as prior gallbladder problems. He was seeing if he could bring pt here today or should take her to ER. I encouraged him to take pt to ER. He states she is stubborn but he will try to get her to ER. This information forwarded to Dr Arline Asp.

## 2012-05-24 NOTE — ED Provider Notes (Signed)
History     CSN: [id number]  Arrival date & time 05/24/12  1538   First MD Initiated Contact with Patient 05/24/12 1543      Chief Complaint  Patient presents with  . Chest Pain  . Nausea    (Consider location/radiation/quality/duration/timing/severity/associated sxs/prior treatment) HPI 79 year old female has recently diagnosed rectal cancer and started radiation treatments within the last several weeks, since she started radiation treatments she has had chronic diarrhea which is now worse over the last week, she has had poor appetite for several weeks but decreased oral intake over the last week, she has had increased nausea over the last week but no vomiting, she has intermittent diffuse abdominal pains every day lasting several hours at a time it is not currently having abdominal pain this afternoon although she did have all day today prior to arrival, she is no chest pain cough shortness breath or fever altered mental status or rash, she is a loose bowel movements several times a day for the last week, the antinausea medicine she got from her cancer doctor yesterday meter too sleepy, a few months ago she had a bile drainage tube with known cholelithiasis but she is not a surgical candidate for a cholecystectomy, she is a history of coronary artery disease, heart failure, valvular heart disease, and rheumatoid arthritis. Past Medical History  Diagnosis Date  . CAD (coronary artery disease)     2 vessel intervention 2003  . Dyslipidemia   . CHF (congestive heart failure)     single episode  . Mitral regurgitation     mild prolapse anterior & posterior leaflets  . Aortic valve sclerosis     mild moderate calcification, echo, 2009  . HTN (hypertension)     difficult to obtain BP at times.  [name] Kitchen Urethral trauma     bleeding in hospital  . Rheumatoid arthritis     severe - deforming  . Lung disease, interstitial     related to rheumatoid arthritis, also question  of left apical  nodule...followed elsewhere..my understanding stabilized  . Carotid artery disease     doppler 12/25/2010,  0-39% bilateral  . Ejection fraction     EF 60%, echo, 11/2007  . Clot     ??? apical clot in the past ?? no longer an issue  . Drug therapy     Prednisone.  . Depression   . GERD (gastroesophageal reflux disease)   . Cataract   . Aortic stenosis     Moderately severe, echo, December, 2013  . Rectal bleed 04/06/2012  . Anemia   . 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 Cancer     rectal    Past Surgical History  Procedure Laterality Date  . Appendectomy    . Breast surgery    . Coronary angioplasty with stent placement  2003  . Colonoscopy N/A 04/08/2012    Procedure: COLONOSCOPY;  Surgeon: Vertell Novak., MD;  Location: [facility] ENDOSCOPY;  Service: Endoscopy;  Laterality: N/A;  . Lymph node biopsy  04/12/12    right inguinal lymph node-squamous cell carcinoma    Family History  Problem Relation Age of Onset  . Heart disease Mother   . Heart disease Father     History  Substance Use Topics  . Smoking status: Never Smoker   . Smokeless tobacco: Never Used  . Alcohol Use: No    OB History   Grav Para Term Preterm Abortions TAB SAB Ect Mult  Living                  Review of Systems 10 Systems reviewed and are negative for acute change except as noted in the HPI. Allergies  Ativan  Home Medications   Current Outpatient Rx  Name  Route  Sig  Dispense  Refill  . acetaminophen (TYLENOL) 500 MG tablet   Oral   Take 1,000 mg by mouth every 6 (six) hours as needed for pain.         [name] Kitchen amitriptyline (ELAVIL) 50 MG tablet   Oral   Take 1 tablet (50 mg total) by mouth at bedtime.   30 tablet   0   . aspirin-acetaminophen-caffeine (EXCEDRIN EXTRA STRENGTH) 250-250-65 MG per tablet   Oral   Take 2 tablets by mouth every 6 (six) hours as needed for pain.          [name] Kitchen atorvastatin (LIPITOR) 20 MG tablet   Oral   Take 20 mg by mouth  every evening.          . Calcium Carbonate-Vitamin D (CALCIUM 600+D) 600-400 MG-UNIT per tablet   Oral   Take 1 tablet by mouth daily.           [name] Kitchen docusate sodium (COLACE) 100 MG capsule   Oral   Take 100 mg by mouth 2 (two) times daily as needed for constipation.         . Ensure Plus (ENSURE PLUS) LIQD   Oral   Take 237 mLs by mouth 2 (two) times daily as needed (for suboptimal po intake.).         [name] Kitchen esomeprazole (NEXIUM) 40 MG capsule   Oral   Take 40 mg by mouth daily before breakfast.         . leflunomide (ARAVA) 20 MG tablet   Oral   Take 20 mg by mouth daily.          [name] Kitchen loperamide (IMODIUM) 2 MG capsule   Oral   Take 2 mg by mouth 4 (four) times daily as needed for diarrhea or loose stools.         . metoprolol succinate (TOPROL-XL) 100 MG 24 hr tablet   Oral   Take 1 tablet (100 mg total) by mouth every evening. Take with or immediately following a meal.   30 tablet   9   . Multiple Vitamins-Minerals (PRESERVISION AREDS PO)   Oral   Take 1 capsule by mouth daily.         . predniSONE (DELTASONE) 5 MG tablet   Oral   Take 5 mg by mouth daily.           . ondansetron (ZOFRAN ODT) 8 MG disintegrating tablet      8mg  ODT q4 hours prn nausea   4 tablet   0   . potassium chloride SA (K-DUR,KLOR-CON) 20 MEQ tablet   Oral   Take 1 tablet (20 mEq total) by mouth every other day.   30 tablet   0     BP 131/63  Pulse 100  Temp(Src) 98.9 F (37.2 C) (Oral)  Resp 22  SpO2 98%  Physical Exam  Nursing note and vitals reviewed. Constitutional:  Awake, alert, nontoxic appearance but emaciated and cachectic-appearing  HENT:  Head: Atraumatic.  Eyes: Right eye exhibits no discharge. Left eye exhibits no discharge.  Neck: Neck supple.  Cardiovascular: Normal rate and regular rhythm.   Murmur heard. Pulmonary/Chest: Effort normal and breath sounds  normal. No respiratory distress. She has no wheezes. She has no rales. She exhibits no  tenderness.  Abdominal: Soft. Bowel sounds are normal. She exhibits no distension and no mass. There is tenderness. There is no rebound and no guarding.  Minimal diffuse tenderness without rebound  Musculoskeletal: She exhibits no tenderness.  Baseline ROM, no obvious new focal weakness. Trace chronic pedal edema but not proximal to the feet.  Neurological: She is alert.  Mental status and motor strength appears baseline for patient and situation.  Skin: No rash noted.  Psychiatric: She has a normal mood and affect.    ED Course  Procedures (including critical care time) ECG: Sinus tachycardia, ventricular 102, normal, minimal diffuse ST depression, no significant change noted compared with December 2013   Pt feels improved after observation and/or treatment in ED. Pt wants discharge, family OK with Pt's decision, U/A pnd. 1820 Labs Reviewed  CBC WITH DIFFERENTIAL - Abnormal; Notable for the following:    RBC 3.08 (*)    Hemoglobin 9.0 (*)    HCT 28.2 (*)    Lymphocytes Relative 8 (*)    Lymphs Abs 0.4 (*)    Monocytes Relative 16 (*)    All other components within normal limits  COMPREHENSIVE METABOLIC PANEL - Abnormal; Notable for the following:    Sodium 133 (*)    Potassium 3.3 (*)    Glucose, Bld 100 (*)    Creatinine, Ser 0.43 (*)    Calcium 8.3 (*)    Total Protein 5.5 (*)    Albumin 2.6 (*)    All other components within normal limits  URINALYSIS, ROUTINE W REFLEX MICROSCOPIC - Abnormal; Notable for the following:    Bilirubin Urine SMALL (*)    Ketones, ur 15 (*)    All other components within normal limits  LIPASE, BLOOD  TROPONIN I   No results found.   1. Dehydration   2. Diarrhea   3. Abdominal pain   4. Rectal cancer       MDM  I doubt any other EMC precluding discharge at this time including, but not necessarily limited to the following:sepsis.        Hurman Horn, MD 05/26/12 [phone]

## 2012-05-25 ENCOUNTER — Ambulatory Visit: Payer: Medicare Other

## 2012-05-25 ENCOUNTER — Other Ambulatory Visit: Payer: Self-pay | Admitting: Medical Oncology

## 2012-05-25 ENCOUNTER — Encounter: Payer: Self-pay | Admitting: Oncology

## 2012-05-25 ENCOUNTER — Ambulatory Visit (HOSPITAL_BASED_OUTPATIENT_CLINIC_OR_DEPARTMENT_OTHER): Payer: Medicare Other | Admitting: Oncology

## 2012-05-25 ENCOUNTER — Telehealth: Payer: Self-pay | Admitting: Oncology

## 2012-05-25 ENCOUNTER — Ambulatory Visit (HOSPITAL_BASED_OUTPATIENT_CLINIC_OR_DEPARTMENT_OTHER): Payer: Medicare Other

## 2012-05-25 ENCOUNTER — Encounter: Payer: Self-pay | Admitting: Medical Oncology

## 2012-05-25 ENCOUNTER — Other Ambulatory Visit: Payer: Medicare Other | Admitting: Lab

## 2012-05-25 VITALS — BP 128/55 | HR 112 | Temp 98.0°F | Resp 18 | Ht 65.0 in | Wt 103.0 lb

## 2012-05-25 DIAGNOSIS — R197 Diarrhea, unspecified: Secondary | ICD-10-CM

## 2012-05-25 DIAGNOSIS — E876 Hypokalemia: Secondary | ICD-10-CM

## 2012-05-25 DIAGNOSIS — C210 Malignant neoplasm of anus, unspecified: Secondary | ICD-10-CM

## 2012-05-25 DIAGNOSIS — R112 Nausea with vomiting, unspecified: Secondary | ICD-10-CM

## 2012-05-25 DIAGNOSIS — D649 Anemia, unspecified: Secondary | ICD-10-CM

## 2012-05-25 MED ORDER — POTASSIUM CHLORIDE CRYS ER 20 MEQ PO TBCR: 20.0000 meq | EXTENDED_RELEASE_TABLET | ORAL | Status: DC

## 2012-05-25 MED ORDER — SODIUM CHLORIDE 0.9 % IV SOLN
Freq: Once | INTRAVENOUS | Status: AC
  Administered 2012-05-25: 17:00:00 via INTRAVENOUS
  Filled 2012-05-25: qty 1000

## 2012-05-25 NOTE — Progress Notes (Signed)
This office note has been dictated.  #[id number]

## 2012-05-26 ENCOUNTER — Ambulatory Visit: Payer: Medicare Other

## 2012-05-26 ENCOUNTER — Telehealth: Payer: Self-pay | Admitting: *Deleted

## 2012-05-26 NOTE — Progress Notes (Signed)
CC:   Lurline Hare, M.D. Luis Abed, MD, [facility] Aundra Dubin, M.D. Lorne Skeens. Hoxworth, M.D.  PROBLEM LIST:  1. Squamous cell carcinoma of the anal canal, stage T2 N2, IIIB, with  biopsy of primary tumor carried out on 04/09/2012 and core needle  biopsies of a right inguinal lymph node on 04/12/2012. CT  scan of the chest, abdomen, and pelvis with IV contrast carried out  on 03/16/2009 showed a right inguinal lymph node measuring 4.2 x  3.0 cm. There were right inguinal lymph nodes, the largest of  which measured 2.2 x 2.1 cm. Thus, if we go back to the time of  that CT scan on 03/16/2009, there was concern that the patient had  a malignant process, which has now been confirmed. Thus, it  appears that this patient's cancer of the anal canal goes back  at least to early 2011. CT scan of chest, abdomen, and pelvis with IV  contrast carried out on 04/13/2012 now shows a perianal mass lesion  measuring 3.9 x 5.1 cm on image 109. The right pelvic sidewall  lymph node mass now measures 4.9 x 3.2 cm on image 92 and the right  inguinal lymph node measures 4.8 x 3.0 cm on image 103. These  lymph nodes have increased compared with the previous CT scan from  02/02/2012.   The patient has been receiving radiation under the direction of Dr. Lurline Hare.  I believe she has received 15 Gy in 6 fractions over 9 days.  The patient is now on break due to toxicity consisting of nausea, vomiting, diarrhea, and generalized weakness.  I believe her last radiation treatment was administered on Friday, 05/19/2012.  2. Coronary artery disease.  3. History of congestive heart failure.  4. Aortic valve stenosis, moderately severe, with a gradient of 30 mm  and a valve area of 0.83 cm sq from a 2-D echocardiogram on  02/04/2012.  5. Rheumatoid arthritis, severe and deforming.  6. Interstitial lung disease.  7. Mitral regurgitation, mild to moderate.  8. Hypertension.  9. Dyslipidemia.   10. Coronary artery disease.  11. Large hiatal hernia.  12. Admission to the hospital in late December 2013 for cholecystitis  in association with E coli bacteremia. Percutaneous gallbladder  drainage was carried out.  13. Cholelithiasis as noted on the CT scan of 04/13/2012. There was no  evidence for cholecystitis.   MEDICATIONS:  Reviewed and recorded. Current Outpatient Prescriptions  Medication Sig Dispense Refill  . acetaminophen (TYLENOL) 500 MG tablet Take 1,000 mg by mouth every 6 (six) hours as needed for pain.      [name] Kitchen amitriptyline (ELAVIL) 50 MG tablet Take 1 tablet (50 mg total) by mouth at bedtime.  30 tablet  0  . aspirin-acetaminophen-caffeine (EXCEDRIN EXTRA STRENGTH) 250-250-65 MG per tablet Take 2 tablets by mouth every 6 (six) hours as needed for pain.       . Ensure Plus (ENSURE PLUS) LIQD Take 237 mLs by mouth 2 (two) times daily as needed (for suboptimal po intake.).      [name] Kitchen esomeprazole (NEXIUM) 40 MG capsule Take 40 mg by mouth daily before breakfast.      . loperamide (IMODIUM) 2 MG capsule Take 2 mg by mouth 4 (four) times daily as needed for diarrhea or loose stools.      . ondansetron (ZOFRAN ODT) 8 MG disintegrating tablet 8mg  ODT q4 hours prn nausea  4 tablet  0  . predniSONE (DELTASONE) 5 MG tablet Take  5 mg by mouth daily.        [name] Kitchen atorvastatin (LIPITOR) 20 MG tablet Take 20 mg by mouth every evening.       . Calcium Carbonate-Vitamin D (CALCIUM 600+D) 600-400 MG-UNIT per tablet Take 1 tablet by mouth daily.        [name] Kitchen docusate sodium (COLACE) 100 MG capsule Take 100 mg by mouth 2 (two) times daily as needed for constipation.      [name] Kitchen leflunomide (ARAVA) 20 MG tablet Take 20 mg by mouth daily.       . metoprolol succinate (TOPROL-XL) 100 MG 24 hr tablet Take 1 tablet (100 mg total) by mouth every evening. Take with or immediately following a meal.  30 tablet  9  . Multiple Vitamins-Minerals (PRESERVISION AREDS PO) Take 1 capsule by mouth daily.      . potassium  chloride SA (K-DUR,KLOR-CON) 20 MEQ tablet Take 1 tablet (20 mEq total) by mouth every other day.  30 tablet  0   No current facility-administered medications for this visit.    SMOKING HISTORY:  The patient has never smoked cigarettes.   HISTORY:  Barbaraann Faster was seen today for followup of her stage IIIB squamous cell carcinoma of the anal canal.  Mrs. Kalman was seen by me on 04/26/2012.  She is accompanied today by the girlfriend of the patient's son.  Her name is Eunice Blase.  The patient apparently decided to receive radiation treatments as a single modality of treatment. Apparently, those treatments were started within the past couple of weeks and according to Dr. Luciano Cutter last note, the patient has received 15 Gy in 6 fractions over 9 days.  I believe her last treatment was on Friday, 05/19/2012.  Unfortunately, the patient has been having nausea, vomiting, diarrhea, and generalized weakness.  Her appetite has been poor, as has her oral intake.  She went to the emergency room yesterday where she received IV fluids.  The patient's condition may be slightly improved.  She is nauseated, but I do not believe she is having vomiting.  She is taking Zofran, the administration of which we reviewed today.  The patient is also taking Imodium for diarrhea, but only about 3 a day.  We reviewed that treatment as well.  The patient is having episodes of incontinence.  Most of her diarrhea apparently occurs during the night.  The patient denies any pain, rectal bleeding, or breathing problems.  PHYSICAL EXAMINATION:  General:  The patient is in a wheelchair.  She is mentally alert, does not look acutely ill or septic.  She is pale, but in no acute distress.  She is not having any nausea, vomiting, diarrhea at this time.  Weight is basically stable at 103 pounds, height 5 feet 5 inches, body surface area 1.46 sq m.  Vital Signs:  Blood pressure 128/55.  Orthostatic vital signs were  obtained.  The patient's blood pressure fell slightly from 128/55 to 100/51.  Pulse went from 114 sitting up to about 150 when she stood.  She is having a little bit of orthostatic symptoms when she stands.  HEENT:  There is no scleral icterus.  Mouth and pharynx are benign.  There is no peripheral adenopathy palpable.  Heart and lungs:  Normal.  Rhythm is regular.  I did not appreciate a systolic ejection murmur.  Abdomen:  With the patient sitting is benign.  I did not try to examine the groin area where a right inguinal lymph node was present previously.  Extremities: The patient does have trace to 1+ edema of both ankles.  She also has severe deforming rheumatoid arthritis involving her hands and fingers.  LABORATORY DATA:  From 05/24/2012, white count 5.4, ANC 3.9, hemoglobin 9.0, hematocrit 28.2, platelets 157,000.  Chemistries from 05/24/2012 notable for a potassium of 3.3, albumin of 2.6, BUN 7, creatinine 0.43, and sodium 133.  Albumin on 04/26/2012 was 3.0.  IMAGING STUDIES:  1. CT scans of chest, abdomen, and pelvis with IV contrast were  carried out on 03/16/2009. There was intrathoracic stomach showing  no evidence for obstruction due to a large hiatal hernia. There  was at least 1 calcified gallstone measuring 1.5 cm. Liver was  normal, as were pancreas and kidneys. Abdominal aorta was  calcified without aneurysm. There were colonic diverticula. There  was a right iliac lymph node measuring 4.2 x 3.0 cm. There were  right inguinal lymph nodes, the largest of which measured 2.2 x 2.1  cm.  2. CT scans of chest, abdomen, and pelvis without IV contrast on  02/02/2012 again showed a large 1.5 x 1.7-cm stone within the neck  of a distended gallbladder with some gallbladder wall thickening.  A large hiatal hernia was noted. There was a right external iliac  pelvic sidewall lymphadenopathy, had increased compared with the CT  scans from 03/16/2009. Lymph node measured 4.6 x 2.4  cm, whereas  previously it measured 4.3 x 2.3 cm. Right inguinal  lymphadenopathy measured 4.1 x 2.5 cm. There was no definite  retroperitoneal or mesenteric adenopathy on this noncontrast  examination.  3. CT scan of chest, abdomen, and pelvis with IV contrast on  04/13/2012 showed very large hiatal hernia. There was coronary and  aortic atherosclerosis. A perianal mass lesion was noted measuring  3.9 x 5.1 cm on image 109. Right pelvic sidewall lymph node mass  measured 4.9 x 3.2 cm on image 92. A right inguinal lymph node  mass measured 4.8 x 3.0 cm on image 103. Gallstones were noted  without evidence of cholecystitis. 4. Ultrasound of the abdomen on 05/24/2012 showed gallstones measuring up to 1.7 cm with borderline gallbladder wall thickening.   IMPRESSION AND PLAN:  Mrs. Kopec appears to be having toxicity from the relatively small amount of radiation she received.  We reviewed suggestions regarding management of her nausea and vomiting with Zofran around-the-clock, 8 mg 2 or 3 times a day, and the management of her diarrhea with Imodium up to 8 a day, either taken on a regular schedule around-the-clock or 2 after every bowel movement.  If the patient's diarrhea does not improve with these modalities, then we may need to look for infection with C difficile.  I think this is unlikely and I suspect that the most likely explanation for the patient's symptoms is the radiation treatments that she has received.  She is not receiving any chemotherapy at this time.  The patient will go back to the treatment area and have IV fluids, 500 mL of normal saline with 20 mEq of K given over 2 hours.  We are also giving her a prescription for K-Dur to take 20 mEq every other day starting tomorrow to try to maintain her potassium levels.  The patient apparently has an appointment in Radiation Therapy on Monday.  I tried to reach Dr. Michell Heinrich this afternoon without success, but left a message  for her so that we can try to coordinate care.  The patient, hopefully, will recover relatively quickly from the side effects  and hopefully can resume her radiation treatments, perhaps on an attenuated schedule given her frail status.  Perhaps some sort of a split course treatment might be given.  We will plan to check CBC and BMET every week.  I have asked Mrs. Freeburg to return in about 5 weeks, which would be somewhere around May 22nd. At that time, we will check CBC, chemistries, iron studies, and CEA.  It will be recalled that the patient's iron studies were previously somewhat low.  CEA back in early March of this year was 6.4.    ______________________________ Samul Dada, M.D. DSM/MEDQ  D:  05/25/2012  T:  05/26/2012  Job:  [id number]

## 2012-05-26 NOTE — Telephone Encounter (Signed)
Returned call from pt's son, [name] who states pt cannot come in for radiation tx today. Reminded him that on 05/23/12 Dr Michell Heinrich put pt's tx on hold for remainder if this week. He states he thought he remembered this. Informed him I will call Mon, 05/29/12 to check on pt's status. He states she was in ED 05/24/12 for severe abdominal pain, was given IVF but refused to be admitted. He states she saw Dr Arline Asp yesterday was given IVF w/K+ and placed on K+ po beginning today. He states she continues to have diarrhea, nausea. He states she is taking Zofran 2-3 x daily, taking Imodium up to 8 tabs daily. He states pt has become incontinent of bowel. He reports pt has "eaten applesauce this morning, is more alert today".  Informed [name] will route this update to Dr Michell Heinrich today, and will call him Monday to get update on pt's status.

## 2012-05-29 ENCOUNTER — Telehealth: Payer: Self-pay | Admitting: Dietician

## 2012-05-29 ENCOUNTER — Ambulatory Visit: Payer: Medicare Other

## 2012-05-29 ENCOUNTER — Telehealth: Payer: Self-pay | Admitting: *Deleted

## 2012-05-29 NOTE — Telephone Encounter (Signed)
Spoke w/pt's son [name] re: update on pt's status. He states "Saturday was pretty rough, but yesterday she was more alert, was able to eat and drink. She ate some cereal this morning.". He states her diarrhea "has slowed down". He states she is "still very weak, was hardly able to walk some days last week". Pt has lab appt on 06/01/12. [name] states they want to wait results of lab. He states pt is unsure if she wants to resume radiation treatments at all. Son states he "wants her to be in charge of making the decisions, and that is what she wants right now." Informed [name] that this RN will call him Thurs afternoon or Friday morning after lab results are in to discuss pt's status, wishes again. Also informed him Dr Michell Heinrich will be in office tomorrow morning and will route this information to her. Pt's son verbalized agreement, understanding. Notified Heather, RT, linac #2 of above conversation. She will put pt on tx break until Friday.

## 2012-05-30 ENCOUNTER — Ambulatory Visit: Payer: Medicare Other

## 2012-05-31 ENCOUNTER — Ambulatory Visit: Admission: RE | Admit: 2012-05-31 | Payer: Medicare Other | Source: Ambulatory Visit

## 2012-05-31 ENCOUNTER — Ambulatory Visit: Payer: Medicare Other

## 2012-06-01 ENCOUNTER — Telehealth: Payer: Self-pay | Admitting: *Deleted

## 2012-06-01 ENCOUNTER — Ambulatory Visit: Payer: Medicare Other

## 2012-06-01 ENCOUNTER — Telehealth: Payer: Self-pay

## 2012-06-01 ENCOUNTER — Other Ambulatory Visit (HOSPITAL_BASED_OUTPATIENT_CLINIC_OR_DEPARTMENT_OTHER): Payer: Medicare Other | Admitting: Oncology

## 2012-06-01 ENCOUNTER — Other Ambulatory Visit: Payer: Self-pay

## 2012-06-01 ENCOUNTER — Other Ambulatory Visit (HOSPITAL_BASED_OUTPATIENT_CLINIC_OR_DEPARTMENT_OTHER): Payer: Medicare Other | Admitting: Lab

## 2012-06-01 DIAGNOSIS — C210 Malignant neoplasm of anus, unspecified: Secondary | ICD-10-CM

## 2012-06-01 DIAGNOSIS — D649 Anemia, unspecified: Secondary | ICD-10-CM

## 2012-06-01 LAB — BASIC METABOLIC PANEL (CC13)
BUN: 8.6 mg/dL (ref 7.0–26.0)
CO2: 25 meq/L (ref 22–29)
Calcium: 8.5 mg/dL (ref 8.4–10.4)
Chloride: 103 meq/L (ref 98–107)
Creatinine: 0.6 mg/dL (ref 0.6–1.1)
Glucose: 91 mg/dL (ref 70–99)
Potassium: 3 meq/L — CL (ref 3.5–5.1)
Sodium: 140 meq/L (ref 136–145)

## 2012-06-01 LAB — CBC WITH DIFFERENTIAL/PLATELET
BASO%: 0.8 % (ref 0.0–2.0)
Basophils Absolute: 0.1 10*3/uL (ref 0.0–0.1)
Eosinophils Absolute: 0.6 10*3/uL — ABNORMAL HIGH (ref 0.0–0.5)
HCT: 32.3 % — ABNORMAL LOW (ref 34.8–46.6)
HGB: 10.2 g/dL — ABNORMAL LOW (ref 11.6–15.9)
LYMPH%: 10.1 % — ABNORMAL LOW (ref 14.0–49.7)
MONO#: 1.1 10*3/uL — ABNORMAL HIGH (ref 0.1–0.9)
NEUT#: 6.2 10*3/uL (ref 1.5–6.5)
NEUT%: 69.5 % (ref 38.4–76.8)
Platelets: 259 10*3/uL (ref 145–400)
WBC: 9 10*3/uL (ref 3.9–10.3)
lymph#: 0.9 10*3/uL (ref 0.9–3.3)

## 2012-06-01 LAB — MAGNESIUM (CC13): Magnesium: 1.9 mg/dL (ref 1.5–2.5)

## 2012-06-01 LAB — TECHNOLOGIST REVIEW: Technologist Review: 2

## 2012-06-01 NOTE — Telephone Encounter (Signed)
S/w pt that Dr Arline Asp wants her to take 3 potassium tablets ( ) today and tomorrow and then to take 1 daily after that. Pt expressed understanding. Reconfirmed lab appt next Thursday.

## 2012-06-01 NOTE — Telephone Encounter (Signed)
S/w [name]. He says pt has diarrhea maybe one time per day. She is eating and drinking more but still weak. She is taking potassium every other day. We will call back when we know what changes we will make. He states pt has not completely decided if she wants to continue XRT or not.  Colen Darling in rad onc informed of this conversation.

## 2012-06-01 NOTE — Telephone Encounter (Signed)
Jamesetta Orleans, pt's son re: pt's status. He reports pt had labs today; her potassium was low, and Dr Arline Asp increased her Potassium. He states she is still weak, just beginning to eat better. He states "Last week really did a number on her. She is still mulling over whether to take radiation". Informed [name] will give this information to Dr Michell Heinrich.

## 2012-06-02 ENCOUNTER — Ambulatory Visit: Payer: Medicare Other

## 2012-06-05 ENCOUNTER — Telehealth: Payer: Self-pay | Admitting: *Deleted

## 2012-06-05 NOTE — Telephone Encounter (Signed)
Talked w/pt's son, [name] re: pt's status. He states she is eating more, has more energy, her diarrhea has stopped, and she feels better. He states she has told him she "feels a knot" but has not specified where she feels a knot. He states she c/o "burning", but he states he is unsure whether this is w/urination or BM's. Will attempt to reach pt by phone.   1:11 pm Spoke w/pt who states she is having burning but unsure whether it is from urination or BMs or both. She states that her stools are light brown, denies bleeding. Pt also states the "knot" in her lymph node on her right side is sore w/walking, but she denies that is is larger. She also reports her right leg and foot are swollen, left foot slightly swollen. Inquired if pt feels she wants to resume radiation treatment; she states "I hate to be that sick again." Pt has lab appt at cancer center 06/08/12, 9:45 am; asked pt if she wants to see Dr Tinnie Gens for FU w/dr's approval. Pt states she is unsure if she should resume radiation;advised dr Janae Bridgeman be able to help her make that decision. Informed pt will discuss w/dr tomorrow morning and call her or her son back. Pt verbalized agreement, understanding.

## 2012-06-06 ENCOUNTER — Telehealth: Payer: Self-pay | Admitting: *Deleted

## 2012-06-06 ENCOUNTER — Encounter: Payer: Self-pay | Admitting: Radiation Oncology

## 2012-06-06 NOTE — Telephone Encounter (Signed)
Per conversation w/Dr Michell Heinrich, pt's final radiation treatments are cancelled. Pt will be scheduled for 1 month FU w/Dr Michell Heinrich. Called Lenora RT, linac #2 and informed her of pt's radiation tx cancelled per Dr Michell Heinrich. She verbalized understanding. Spoke w/pt's son, [name] and informed him that Dr Michell Heinrich has cancelled pt's radiation tx, will see her for FU in 1 month. Informed him Talbert Forest will call him w/appt; he agrees. Advised him Dr Arline Asp will continue to FU w/pt; reminded him that her next appt  to see Dr Arline Asp is 07/04/12.  Ruffin Pyo to call if pt developes rectal bleeding, pain, other problems, issues. He verbalized agreement, understanding.

## 2012-06-06 NOTE — Telephone Encounter (Signed)
CALLED PATIENT'S SON MICHAEL TO INFORM OF FU VISIT WITH DR. Michell Heinrich ON 07-06-12, LVM FOR  A RETURN CALL

## 2012-06-07 NOTE — Progress Notes (Signed)
  Radiation Oncology         [phone]) [phone] ________________________________  Name: [name] MRN: [id number]  Date: 05/10/2012  DOB: 1925/03/01  Simulation Verification Note  Status: outpatient  NARRATIVE: The patient was brought to the treatment unit and placed in the planned treatment position. The clinical setup was verified. Then port films were obtained and uploaded to the radiation oncology medical record software.  The treatment beams were carefully compared against the planned radiation fields. The position location and shape of the radiation fields was reviewed. The targeted volume of tissue appears appropriately covered by the radiation beams. Organs at risk appear to be excluded as planned.  Based on my personal review, I approved the simulation verification. The patient's treatment will proceed as planned.  ------------------------------------------------  Lurline Hare, MD

## 2012-06-07 NOTE — Progress Notes (Signed)
Name: [name]   MRN: [id number]  Date:  05/02/2012  DOB: 10/30/1925  Status:outpatient    DIAGNOSIS: Locally advanced anal cancer  CONSENT VERIFIED: yes   SET UP: Patient is setup supine   IMMOBILIZATION:  The following immobilization was used: Vac Loc  NARRATIVE:  Pt Stecher was brought to the CT Investment banker, corporate.  Identity was confirmed.  All relevant records and images related to the planned course of therapy were reviewed.  Then, the patient was positioned in a stable reproducible clinical set-up for radiation therapy.  CT images were obtained.  An isocenter was placed. Skin markings were placed.  The CT images were loaded into the planning software where the target and avoidance structures were contoured.  The radiation prescription was entered and confirmed. The patient was discharged in stable condition and tolerated simulation well.    TREATMENT PLANNING NOTE:  Treatment planning then occurred. I have requested : MLC's, isodose plan, basic dose calculation  I have requested 3 dimensional simulation with DVH of cord, kidneys, bowel and GTV  I personally supervised and approved the construction of 3 medically necessary complex treatment devices.

## 2012-06-07 NOTE — Progress Notes (Signed)
  Radiation Oncology         ([phone]) [phone] ________________________________  Name: [name] MRN: [id number]  Date: 06/06/2012  DOB: [birth year]  End of Treatment Note  Diagnosis:   Locally advanced anal cancer     Indication for treatment:  Palliation       Radiation treatment dates:   05/11/12-05/19/12  Site/dose:   Pelvis and inguinal lymph nodes  Beams/energy:   AP/PA with 6 and 15 MV photons  Narrative: I started [name] on palliative radiation after discussions with her and her son. Even after her first treatment she was having nausea and vomiting. She ultimately was only able to complete 6 fractions. I gave her a break hoping that she would rally and be able to complete her treatment. She however continued to feel weak despite clinical improvement. We ultimately decided to truncate her treatment early and return in a month after giving her some time off. She did not experiencing any further bleeding during the course of her treatment.  Plan: The patient has completed radiation treatment. The patient will return to radiation oncology clinic for routine followup in one month. I advised them to call or return sooner if they have any questions or concerns related to their recovery or treatment. She also has regularly scheduled follow up with medical oncology.   ------------------------------------------------  Lurline Hare, MD

## 2012-06-08 ENCOUNTER — Other Ambulatory Visit (HOSPITAL_BASED_OUTPATIENT_CLINIC_OR_DEPARTMENT_OTHER): Payer: Medicare Other | Admitting: Lab

## 2012-06-08 DIAGNOSIS — C210 Malignant neoplasm of anus, unspecified: Secondary | ICD-10-CM

## 2012-06-08 DIAGNOSIS — C211 Malignant neoplasm of anal canal: Secondary | ICD-10-CM

## 2012-06-08 LAB — CBC WITH DIFFERENTIAL/PLATELET
Eosinophils Absolute: 0.5 10*3/uL (ref 0.0–0.5)
MONO#: 0.9 10*3/uL (ref 0.1–0.9)
MONO%: 10 % (ref 0.0–14.0)
NEUT#: 7 10*3/uL — ABNORMAL HIGH (ref 1.5–6.5)
RBC: 3.23 10*6/uL — ABNORMAL LOW (ref 3.70–5.45)
RDW: 17.3 % — ABNORMAL HIGH (ref 11.2–14.5)
WBC: 9.1 10*3/uL (ref 3.9–10.3)

## 2012-06-08 LAB — BASIC METABOLIC PANEL (CC13)
CO2: 26 mEq/L (ref 22–29)
Glucose: 106 mg/dl — ABNORMAL HIGH (ref 70–99)
Potassium: 3.8 mEq/L (ref 3.5–5.1)
Sodium: 141 mEq/L (ref 136–145)

## 2012-06-12 ENCOUNTER — Ambulatory Visit: Payer: Medicare Other

## 2012-06-13 ENCOUNTER — Other Ambulatory Visit: Payer: Self-pay | Admitting: *Deleted

## 2012-06-13 ENCOUNTER — Ambulatory Visit: Payer: Medicare Other

## 2012-06-13 MED ORDER — ATORVASTATIN CALCIUM 20 MG PO TABS: 20.0000 mg | ORAL_TABLET | Freq: Every evening | ORAL | Status: DC

## 2012-06-14 ENCOUNTER — Ambulatory Visit: Payer: Medicare Other

## 2012-06-15 ENCOUNTER — Other Ambulatory Visit (HOSPITAL_BASED_OUTPATIENT_CLINIC_OR_DEPARTMENT_OTHER): Payer: Medicare Other | Admitting: Lab

## 2012-06-15 ENCOUNTER — Telehealth: Payer: Self-pay

## 2012-06-15 ENCOUNTER — Telehealth: Payer: Self-pay | Admitting: Cardiology

## 2012-06-15 DIAGNOSIS — C211 Malignant neoplasm of anal canal: Secondary | ICD-10-CM

## 2012-06-15 DIAGNOSIS — C210 Malignant neoplasm of anus, unspecified: Secondary | ICD-10-CM

## 2012-06-15 LAB — CBC WITH DIFFERENTIAL/PLATELET
BASO%: 0.8 % (ref 0.0–2.0)
Basophils Absolute: 0.1 10*3/uL (ref 0.0–0.1)
HCT: 27.6 % — ABNORMAL LOW (ref 34.8–46.6)
HGB: 8.9 g/dL — ABNORMAL LOW (ref 11.6–15.9)
LYMPH%: 7.9 % — ABNORMAL LOW (ref 14.0–49.7)
MCHC: 32.3 g/dL (ref 31.5–36.0)
MONO#: 0.8 10*3/uL (ref 0.1–0.9)
NEUT%: 75.5 % (ref 38.4–76.8)
Platelets: 209 10*3/uL (ref 145–400)
WBC: 6.6 10*3/uL (ref 3.9–10.3)

## 2012-06-15 LAB — BASIC METABOLIC PANEL (CC13)
BUN: 12.6 mg/dL (ref 7.0–26.0)
CO2: 23 mEq/L (ref 22–29)
Chloride: 107 mEq/L (ref 98–107)
Glucose: 108 mg/dl — ABNORMAL HIGH (ref 70–99)
Potassium: 4.2 mEq/L (ref 3.5–5.1)
Sodium: 140 mEq/L (ref 136–145)

## 2012-06-15 NOTE — Telephone Encounter (Signed)
S/w pt that Dr Arline Asp cancelled her 5/22 lab appt because she is getting labs on 5/27. Pt expressed understanding. She stated her legs have been swelling and her doctor said he did not think it was from her arthritis. I directed her to her cardiologist or PCP.

## 2012-06-15 NOTE — Telephone Encounter (Signed)
New problem   Pt is having swelling in legs and feet and has been in hospital several times and her Oncologist stated she should see cardiologist Please call pt.

## 2012-06-15 NOTE — Telephone Encounter (Signed)
Per Dr. Myrtis Ser, the pt and her son, [name], is advised that the pt has been scheduled an appointment with Dr. Myrtis Ser next Friday (May 16) and that someone from this office will contact them with the time of appointment, they both verbalized understanding.

## 2012-06-16 ENCOUNTER — Ambulatory Visit: Payer: Medicare Other

## 2012-06-16 ENCOUNTER — Other Ambulatory Visit: Payer: Self-pay

## 2012-06-16 MED ORDER — ESOMEPRAZOLE MAGNESIUM 40 MG PO CPDR: 40.0000 mg | DELAYED_RELEASE_CAPSULE | Freq: Every day | ORAL | Status: AC

## 2012-06-22 ENCOUNTER — Other Ambulatory Visit (HOSPITAL_BASED_OUTPATIENT_CLINIC_OR_DEPARTMENT_OTHER): Payer: Medicare Other | Admitting: Lab

## 2012-06-22 DIAGNOSIS — C210 Malignant neoplasm of anus, unspecified: Secondary | ICD-10-CM

## 2012-06-22 LAB — CBC WITH DIFFERENTIAL/PLATELET
BASO%: 0.9 % (ref 0.0–2.0)
HCT: 30 % — ABNORMAL LOW (ref 34.8–46.6)
LYMPH%: 9.1 % — ABNORMAL LOW (ref 14.0–49.7)
MCH: 28.8 pg (ref 25.1–34.0)
MCHC: 31.9 g/dL (ref 31.5–36.0)
MCV: 90.3 fL (ref 79.5–101.0)
MONO#: 0.6 10*3/uL (ref 0.1–0.9)
MONO%: 9.9 % (ref 0.0–14.0)
NEUT%: 70.3 % (ref 38.4–76.8)
Platelets: 214 10*3/uL (ref 145–400)
RBC: 3.32 10*6/uL — ABNORMAL LOW (ref 3.70–5.45)
WBC: 5.9 10*3/uL (ref 3.9–10.3)

## 2012-06-22 LAB — BASIC METABOLIC PANEL (CC13)
CO2: 24 mEq/L (ref 22–29)
Calcium: 8.9 mg/dL (ref 8.4–10.4)
Creatinine: 0.6 mg/dL (ref 0.6–1.1)
Sodium: 139 mEq/L (ref 136–145)

## 2012-06-23 ENCOUNTER — Ambulatory Visit (INDEPENDENT_AMBULATORY_CARE_PROVIDER_SITE_OTHER): Payer: Medicare Other | Admitting: Cardiology

## 2012-06-23 ENCOUNTER — Encounter: Payer: Self-pay | Admitting: Cardiology

## 2012-06-23 VITALS — BP 110/80 | HR 96 | Ht 64.0 in | Wt 96.0 lb

## 2012-06-23 DIAGNOSIS — C20 Malignant neoplasm of rectum: Secondary | ICD-10-CM

## 2012-06-23 DIAGNOSIS — Z79899 Other long term (current) drug therapy: Secondary | ICD-10-CM

## 2012-06-23 DIAGNOSIS — I2510 Atherosclerotic heart disease of native coronary artery without angina pectoris: Secondary | ICD-10-CM

## 2012-06-23 DIAGNOSIS — I350 Nonrheumatic aortic (valve) stenosis: Secondary | ICD-10-CM

## 2012-06-23 DIAGNOSIS — I359 Nonrheumatic aortic valve disorder, unspecified: Secondary | ICD-10-CM

## 2012-06-23 DIAGNOSIS — R609 Edema, unspecified: Secondary | ICD-10-CM

## 2012-06-23 DIAGNOSIS — I509 Heart failure, unspecified: Secondary | ICD-10-CM

## 2012-06-23 NOTE — Assessment & Plan Note (Addendum)
The patient has some mild edema in the left foot and ankle. She says that it was somewhat worse 2 weeks ago. The patient has many reasons to retain fluid. She has moderately severe aortic stenosis. She is on steroids. I'm not sure what her albumin is. I carefully considered whether or not we should add a diuretic. However considering all of her other issues right now I'm afraid that we could possibly make her worse with a diuretic at this time. If she has increasing edema I certainly agree that adding a very low dose of Lasix at 20 mg daily or 20 mg every other day would be reasonable approach. However I have chosen not to start that at this time.  As part of today's evaluation I spent greater than 25 minutes with a total care. I spent more than half of this time with direct patient contact speaking with her and her some and evaluating her complex issues.

## 2012-06-23 NOTE — Assessment & Plan Note (Signed)
Her prednisone therapy certainly plays a role with her volume retention. At some point she may need a daily diuretic.

## 2012-06-23 NOTE — Assessment & Plan Note (Signed)
The patient had two-vessel intervention in 2003. She's not having any ischemic symptoms. No further workup is needed at this time.

## 2012-06-23 NOTE — Assessment & Plan Note (Signed)
We know from December, 2013 that the patient has moderately severe aortic stenosis. This can play a role with some volume overload. At this point she does not need a followup echo. I will follow her aortic stenosis over time.

## 2012-06-23 NOTE — Assessment & Plan Note (Signed)
This problem is being managed completely by the oncology team. She has symptoms when they tried to proceed with radiation.

## 2012-06-23 NOTE — Patient Instructions (Addendum)
**Note De-identified  Obfuscation** Your physician recommends that you continue on your current medications as directed. Please refer to the Current Medication list given to you today.  Your physician wants you to follow-up in: 1 year. You will receive a reminder letter in the mail two months in advance. If you don't receive a letter, please call our office to schedule the follow-up appointment.  

## 2012-06-23 NOTE — Progress Notes (Signed)
HPI  The patient is seen today to help assess some mild peripheral edema. She has had one episode in the past of CHF related to volume while in the hospital. She does have significant aortic stenosis. She has good left ventricular function. Recently she has Squamous cell carcinoma of the rectum. Some of her treatment has been limited by nausea and vomiting. She is frail. In addition she has deforming rheumatoid arthritis.  She has had some peripheral edema. It was actually worse approximately 2 weeks ago. It has now decreased. She's not having any PND or orthopnea. She has a little residual edema in her left leg.  Allergies  Allergen Reactions  . Ativan (Lorazepam)     Confused and out of it    Current Outpatient Prescriptions  Medication Sig Dispense Refill  . acetaminophen (TYLENOL) 500 MG tablet Take 1,000 mg by mouth every 6 (six) hours as needed for pain.      [name] Kitchen amitriptyline (ELAVIL) 50 MG tablet Take 1 tablet (50 mg total) by mouth at bedtime.  30 tablet  0  . aspirin-acetaminophen-caffeine (EXCEDRIN EXTRA STRENGTH) 250-250-65 MG per tablet Take 2 tablets by mouth every 6 (six) hours as needed for pain.       [name] Kitchen atorvastatin (LIPITOR) 20 MG tablet Take 1 tablet (20 mg total) by mouth every evening.  30 tablet  1  . Calcium Carbonate-Vitamin D (CALCIUM 600+D) 600-400 MG-UNIT per tablet Take 1 tablet by mouth daily.        [name] Kitchen docusate sodium (COLACE) 100 MG capsule Take 100 mg by mouth 2 (two) times daily as needed for constipation.      . Ensure Plus (ENSURE PLUS) LIQD Take 237 mLs by mouth 2 (two) times daily as needed (for suboptimal po intake.).      [name] Kitchen esomeprazole (NEXIUM) 40 MG capsule Take 1 capsule (40 mg total) by mouth daily before breakfast.  30 capsule  2  . leflunomide (ARAVA) 20 MG tablet Take 20 mg by mouth daily.       [name] Kitchen loperamide (IMODIUM) 2 MG capsule Take 2 mg by mouth 4 (four) times daily as needed for diarrhea or loose stools.      . metoprolol succinate  (TOPROL-XL) 100 MG 24 hr tablet Take 1 tablet (100 mg total) by mouth every evening. Take with or immediately following a meal.  30 tablet  9  . Multiple Vitamins-Minerals (PRESERVISION AREDS PO) Take 1 capsule by mouth daily.      . ondansetron (ZOFRAN ODT) 8 MG disintegrating tablet 8mg  ODT q4 hours prn nausea  4 tablet  0  . potassium chloride SA (K-DUR,KLOR-CON) 20 MEQ tablet Take 1 tablet (20 mEq total) by mouth every other day.  30 tablet  0  . predniSONE (DELTASONE) 5 MG tablet Take 5 mg by mouth daily.         No current facility-administered medications for this visit.    History   Social History  . Marital Status: Widowed    Spouse Name: N/A    Number of Children: N/A  . Years of Education: N/A   Occupational History  . Retired    Social History Main Topics  . Smoking status: Never Smoker   . Smokeless tobacco: Never Used  . Alcohol Use: No  . Drug Use: No  . Sexually Active: Not on file   Other Topics Concern  . Not on file   Social History Narrative   Lives with son in [place], [place].  She is retired.    Family History  Problem Relation Age of Onset  . Heart disease Mother   . Heart disease Father     Past Medical History  Diagnosis Date  . CAD (coronary artery disease)     2 vessel intervention 2003  . Dyslipidemia   . CHF (congestive heart failure)     single episode  . Mitral regurgitation     mild prolapse anterior & posterior leaflets  . Aortic valve sclerosis     mild moderate calcification, echo, 2009  . HTN (hypertension)     difficult to obtain BP at times.  [name] Kitchen Urethral trauma     bleeding in hospital  . Rheumatoid arthritis     severe - deforming  . Lung disease, interstitial     related to rheumatoid arthritis, also question  of left apical nodule...followed elsewhere..my understanding stabilized  . Carotid artery disease     doppler 12/25/2010,  0-39% bilateral  . Ejection fraction     EF 60%, echo, 11/2007  . Clot     ???  apical clot in the past ?? no longer an issue  . Drug therapy     Prednisone.  . Depression   . GERD (gastroesophageal reflux disease)   . Cataract   . Aortic stenosis     Moderately severe, echo, December, 2013  . Rectal bleed 04/06/2012  . Anemia   . 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 Cancer     rectal    Past Surgical History  Procedure Laterality Date  . Appendectomy    . Breast surgery    . Coronary angioplasty with stent placement  2003  . Colonoscopy N/A 04/08/2012    Procedure: COLONOSCOPY;  Surgeon: Vertell Novak., MD;  Location: [facility] ENDOSCOPY;  Service: Endoscopy;  Laterality: N/A;  . Lymph node biopsy  04/12/12    right inguinal lymph node-squamous cell carcinoma    Patient Active Problem List   Diagnosis Date Noted  . Cancer   . Squamous cell carcinoma of rectum   . Anal squamous cell carcinoma 04/13/2012  . Bright red blood per rectum 04/06/2012  . Aortic stenosis   . Hypokalemia 02/05/2012  . Normocytic anemia 02/05/2012  . Bacteremia due to Escherichia coli 02/05/2012  . Lymphadenopathy, abdominal 02/03/2012  . Cholelithiasis 02/03/2012  . Acute cholecystitis 02/03/2012  . Hiatal hernia 02/02/2012  . Mitral regurgitation   . Carotid artery disease   . Drug therapy   . Clot   . Ejection fraction   . Lung disease, interstitial   . CHF (congestive heart failure)   . Dyslipidemia   . CAD (coronary artery disease)   . Rheumatoid arthritis on chronic steroids   . HYPERTENSION 10/26/2008    ROS   Patient denies fever, chills, headache, sweats, rash, change in vision, change in hearing, chest pain, cough, urinary symptoms. All other systems are reviewed and are negative.  PHYSICAL EXAM   Patient is oriented to person time and place. Affect is normal. She is thin and frail. She has deforming rheumatoid arthritis. She is here with her son. There is no jugulovenous distention. Lungs are clear. Respiratory effort is nonlabored.  Cardiac exam reveals S1 and S2. She has a high pitched crescendo decrescendo systolic murmur of aortic stenosis. The abdomen is soft. She has trace edema in her left foot. She has significant deforming rheumatoid arthritis. There are no inflamed joints at this time. There  no skin rashes.  Filed Vitals:   06/23/12 0920  BP: 110/80  Pulse: 96  Height: 5\' 4"  (1.626 m)  Weight: 96 lb (43.545 kg)     ASSESSMENT & PLAN

## 2012-06-23 NOTE — Assessment & Plan Note (Signed)
The patient had a single episode of congestive heart failure. She has normal left ventricular systolic function. She's not in heart failure at this time. No change in therapy.

## 2012-06-29 ENCOUNTER — Other Ambulatory Visit: Payer: Medicare Other

## 2012-07-04 ENCOUNTER — Other Ambulatory Visit: Payer: Self-pay | Admitting: Medical Oncology

## 2012-07-04 ENCOUNTER — Telehealth: Payer: Self-pay | Admitting: *Deleted

## 2012-07-04 ENCOUNTER — Ambulatory Visit: Payer: Medicare Other | Admitting: Oncology

## 2012-07-04 ENCOUNTER — Other Ambulatory Visit (HOSPITAL_BASED_OUTPATIENT_CLINIC_OR_DEPARTMENT_OTHER): Payer: Medicare Other

## 2012-07-04 ENCOUNTER — Encounter: Payer: Self-pay | Admitting: Medical Oncology

## 2012-07-04 ENCOUNTER — Ambulatory Visit (HOSPITAL_BASED_OUTPATIENT_CLINIC_OR_DEPARTMENT_OTHER): Payer: Medicare Other | Admitting: Oncology

## 2012-07-04 ENCOUNTER — Other Ambulatory Visit: Payer: Medicare Other | Admitting: Lab

## 2012-07-04 ENCOUNTER — Encounter: Payer: Self-pay | Admitting: Oncology

## 2012-07-04 VITALS — BP 147/62 | HR 84 | Temp 97.9°F | Resp 18 | Ht 64.0 in | Wt 98.8 lb

## 2012-07-04 DIAGNOSIS — D649 Anemia, unspecified: Secondary | ICD-10-CM

## 2012-07-04 DIAGNOSIS — C210 Malignant neoplasm of anus, unspecified: Secondary | ICD-10-CM

## 2012-07-04 LAB — IRON AND TIBC
%SAT: 11 % — ABNORMAL LOW (ref 20–55)
Iron: 33 ug/dL — ABNORMAL LOW (ref 42–145)

## 2012-07-04 LAB — CBC WITH DIFFERENTIAL/PLATELET
BASO%: 0.7 % (ref 0.0–2.0)
Basophils Absolute: 0 10*3/uL (ref 0.0–0.1)
EOS%: 5.5 % (ref 0.0–7.0)
HCT: 29.3 % — ABNORMAL LOW (ref 34.8–46.6)
HGB: 9.6 g/dL — ABNORMAL LOW (ref 11.6–15.9)
MCH: 29.8 pg (ref 25.1–34.0)
MCHC: 32.7 g/dL (ref 31.5–36.0)
MONO#: 0.6 10*3/uL (ref 0.1–0.9)
RDW: 19.3 % — ABNORMAL HIGH (ref 11.2–14.5)
WBC: 5.8 10*3/uL (ref 3.9–10.3)
lymph#: 0.6 10*3/uL — ABNORMAL LOW (ref 0.9–3.3)

## 2012-07-04 LAB — CEA: CEA: 3.1 ng/mL (ref 0.0–5.0)

## 2012-07-04 LAB — COMPREHENSIVE METABOLIC PANEL (CC13)
AST: 16 U/L (ref 5–34)
Albumin: 3.1 g/dL — ABNORMAL LOW (ref 3.5–5.0)
Alkaline Phosphatase: 65 U/L (ref 40–150)
BUN: 11.7 mg/dL (ref 7.0–26.0)
Creatinine: 0.6 mg/dL (ref 0.6–1.1)
Potassium: 3.4 mEq/L — ABNORMAL LOW (ref 3.5–5.1)

## 2012-07-04 MED ORDER — POTASSIUM CHLORIDE CRYS ER 20 MEQ PO TBCR: EXTENDED_RELEASE_TABLET | ORAL | Status: DC

## 2012-07-04 NOTE — Telephone Encounter (Signed)
gv pt relative appt schedule for July.  ° °

## 2012-07-04 NOTE — Progress Notes (Signed)
This office note has been dictated.  #[id number]

## 2012-07-04 NOTE — Telephone Encounter (Signed)
gv pt relative appt schedule for July.

## 2012-07-04 NOTE — Progress Notes (Signed)
CC:   [name], M.D. Marissa Abed, MD, [facility] [name], M.D. [name]. Hoxworth, M.D.  PROBLEM LIST:  1. Squamous cell carcinoma of the anal canal, stage T2 N2, IIIB, with  biopsy of primary tumor carried out on 04/09/2012 and core Ellis  biopsies of a right inguinal lymph node on 04/12/2012. CT  scan of the chest, abdomen, and pelvis with IV contrast carried out  on 03/16/2009 showed a right inguinal lymph node measuring 4.2 x  3.0 cm. There were right inguinal lymph nodes, the largest of  which measured 2.2 x 2.1 cm. Thus, if we go back to the time of  that CT scan on 03/16/2009, there was concern that the patient had  a malignant process, which has now been confirmed. Thus, it  appears that this patient's cancer of the anal canal goes back  at least to early 2011. CT scan of chest, abdomen, and pelvis with IV  contrast carried out on 04/13/2012 now shows a perianal mass lesion  measuring 3.9 x 5.1 cm on image 109. The right pelvic sidewall  lymph node mass now measures 4.9 x 3.2 cm on image 92 and the right  inguinal lymph node measures 4.8 x 3.0 cm on image 103. These  lymph nodes have increased compared with the previous CT scan from  02/02/2012.  The patient received radiation to the pelvis and inguinal lymph nodes under the direction of Dr. Lurline Ellis. She received 15 Gy in 6 fractions over 9 days from 05/11/2012 through 05/19/2012. Radiation treatments needed to be stopped because of GI toxicity and a marked decline in the patient's performance status.   2. Coronary artery disease.  3. History of congestive heart failure.  4. Aortic valve stenosis, moderately severe, with a gradient of 30 mm  and a valve area of 0.83 cm sq from a 2-D echocardiogram on  02/04/2012.  5. Rheumatoid arthritis, severe and deforming.  6. Interstitial lung disease.  7. Mitral regurgitation, mild to moderate.  8. Hypertension.  9. Dyslipidemia.  10. Coronary artery  disease.  11. Large hiatal hernia.  12. Admission to the hospital in late December 2013 for cholecystitis  in association with E coli bacteremia. Percutaneous gallbladder  drainage was carried out.  13. Cholelithiasis as noted on the CT scan of 04/13/2012. There was no  evidence for cholecystitis.   MEDICATIONS:  Reviewed and recorded. Current Outpatient Prescriptions  Medication Sig Dispense Refill  . acetaminophen (TYLENOL) 500 MG tablet Take 1,000 mg by mouth every 6 (six) hours as needed for pain.      [name] Kitchen amitriptyline (ELAVIL) 50 MG tablet Take 1 tablet (50 mg total) by mouth at bedtime.  30 tablet  0  . aspirin-acetaminophen-caffeine (EXCEDRIN EXTRA STRENGTH) 250-250-65 MG per tablet Take 2 tablets by mouth every 6 (six) hours as needed for pain.       [name] Kitchen atorvastatin (LIPITOR) 20 MG tablet Take 1 tablet (20 mg total) by mouth every evening.  30 tablet  1  . Calcium Carbonate-Vitamin D (CALCIUM 600+D) 600-400 MG-UNIT per tablet Take 1 tablet by mouth daily.        [name] Kitchen docusate sodium (COLACE) 100 MG capsule Take 100 mg by mouth 2 (two) times daily as needed for constipation.      . Ensure Plus (ENSURE PLUS) LIQD Take 237 mLs by mouth 2 (two) times daily as needed (for suboptimal po intake.).      [name] Kitchen esomeprazole (NEXIUM) 40 MG capsule Take 1 capsule (  40 mg total) by mouth daily before breakfast.  30 capsule  2  . leflunomide (ARAVA) 20 MG tablet Take 20 mg by mouth daily.       [name] Kitchen loperamide (IMODIUM) 2 MG capsule Take 2 mg by mouth 4 (four) times daily as needed for diarrhea or loose stools.      . metoprolol succinate (TOPROL-XL) 100 MG 24 hr tablet Take 1 tablet (100 mg total) by mouth every evening. Take with or immediately following a meal.  30 tablet  9  . Multiple Vitamins-Minerals (PRESERVISION AREDS PO) Take 1 capsule by mouth daily.      . ondansetron (ZOFRAN ODT) 8 MG disintegrating tablet 8mg  ODT q4 hours prn nausea  4 tablet  0  . predniSONE (DELTASONE) 5 MG tablet Take 5 mg by  mouth daily.        . potassium chloride SA (K-DUR,KLOR-CON) 20 MEQ tablet Take one tablet daily for 3 days then one tablet every other day.  30 tablet  3   No current facility-administered medications for this visit.    SMOKING HISTORY:  The patient has never smoked cigarettes.   HISTORY:  I saw [name] today for followup of her stage IIIB squamous cell carcinoma of the anal canal.  [name] was last seen by me on 05/25/2012.  She is here today with her son [name], with whom she lives.  [name] condition  over the past 6 weeks has basically improved.  The patient says her appetite is okay.  She is not having any nausea or vomiting.  She is not having any diarrhea but actually has some constipation.  She has Colace but she is not taking that.  She is not having any rectal bleeding or rectal pain.  She denies any pain any where.  No shortness of breath.  Her energy remains low.  She is able to get around her home without any walking aids.  She is fairly inactive but she is doing some limited work around the house.  Her biggest problem is that she has developed some twitching of her left side which she says has been intermittently present for a long time.  She is taking in 1 can of Ensure a day.  She gets filled easily.  She is without any major complaints or problems at this time.  Unfortunately, she has lost about 4-5 pounds.  We did talk about nutritional supplements and I encouraged her to try to take in more than 1 Ensure a day.  We talked about ways to manipulate the taste of the Ensure.  PHYSICAL EXAMINATION:  The patient is in a wheelchair.  She is in no acute distress but looks frail and weak.  She recently celebrated her 88th birthday.  Weight is 98 pounds 12.8 ounces, as compared with 103 pounds on 05/24/2012.  Height 5 feet 4 inches.  Body surface area 1.42 sq m.  Blood pressure 147/62.  Other vital signs are normal.  O2 saturation on room air at rest was  99%.  I did not observe any muscular twitching.  No scleral icterus.  Mouth and pharynx are benign.  There is no peripheral adenopathy palpable in the neck, axillary or supraclavicular areas.  The patient does have palpable lymph node in the right groin measuring about 2 cm across x 1 cm vertically.  I believe this is smaller than it had been.  Lungs are clear to percussion and auscultation.  Cardiac exam regular rhythm with systolic  ejection murmur.  Abdomen with the patient sitting is benign.  Extremities:  1 to 2+ edema of both ankles.  She has severe deforming rheumatoid arthritis involving her hands and fingers.  LABORATORY DATA:  Today, white count 5.8, ANC 4.1, hemoglobin 9.6, hematocrit 29.3, platelets 174,000.  Chemistries today notable for an albumin of 3.1, potassium 3.4, BUN 12, creatinine 0.6, LDH 225.  CEA and iron studies are pending.  On 04/06/2012 ferritin was 44 and iron saturation 15%.  Magnesium level on 06/01/2012 was 1.9.  IMAGING STUDIES:  1. CT scans of chest, abdomen, and pelvis with IV contrast were  carried out on 03/16/2009. There was intrathoracic stomach showing  no evidence for obstruction due to a large hiatal hernia. There  was at least 1 calcified gallstone measuring 1.5 cm. Liver was  normal, as were pancreas and kidneys. Abdominal aorta was  calcified without aneurysm. There were colonic diverticula. There  was a right iliac lymph node measuring 4.2 x 3.0 cm. There were  right inguinal lymph nodes, the largest of which measured 2.2 x 2.1  cm.  2. CT scans of chest, abdomen, and pelvis without IV contrast on  02/02/2012 again showed a large 1.5 x 1.7-cm stone within the neck  of a distended gallbladder with some gallbladder wall thickening.  A large hiatal hernia was noted. There was a right external iliac  pelvic sidewall lymphadenopathy, had increased compared with the CT  scans from 03/16/2009. Lymph node measured 4.6 x 2.4 cm, whereas   previously it measured 4.3 x 2.3 cm. Right inguinal  lymphadenopathy measured 4.1 x 2.5 cm. There was no definite  retroperitoneal or mesenteric adenopathy on this noncontrast  examination.  3. CT scan of chest, abdomen, and pelvis with IV contrast on  04/13/2012 showed very large hiatal hernia. There was coronary and  aortic atherosclerosis. A perianal mass lesion was noted measuring  3.9 x 5.1 cm on image 109. Right pelvic sidewall lymph node mass  measured 4.9 x 3.2 cm on image 92. A right inguinal lymph node  mass measured 4.8 x 3.0 cm on image 103. Gallstones were noted  without evidence of cholecystitis.  4. Ultrasound of the abdomen on 05/24/2012 showed gallstones measuring up  to 1.7 cm with borderline gallbladder wall thickening.   IMPRESSION AND PLAN:  Ms. Prescott condition seems markedly improved over the past several weeks.  It will be recalled that her radiation treatments were stopped after only 15 Gy which were administered almost 2 months ago.  I believe the patient has had some response.  The patient has an appointment to see Dr. Michell Heinrich again in a couple of days, Thursday May 29.  I suggested that she try to keep that appointment.  I have no plans for treatment at this time.  Again, we talked about nutritional supplementation and trying to get some of the weight back that this patient has lost, approximately 5 or 6 pounds since mid March.  The patient had been on some potassium previously for hypokalemia.  She stopped taking potassium at least a week ago when she ran out.  We are refilling her prescription for potassium for her to take 20 mEq daily for 3 days and then 20 mEq every other day.  I have no plans for treatment at this time.  The patient will return in approximately 2 months, at which time we will check CBC, chemistries and CEA.  Hopefully the patient will have very indolent disease and may not need to  make a decision about further therapy for a  while.    ______________________________ Samul Dada, M.D. DSM/MEDQ  D:  07/04/2012  T:  07/04/2012  Job:  [id number]

## 2012-07-04 NOTE — Telephone Encounter (Signed)
[name] called per Dr.Murinson requesting total dose of radiation to this patient, after speaking with Dr.Wenworth, patient had toal 15GY, called and left voice message on Felicita Gage, RN voice answering machine [phone], if any other questions can call me back [phone] 11:52 AM

## 2012-07-05 ENCOUNTER — Telehealth: Payer: Self-pay | Admitting: Medical Oncology

## 2012-07-06 ENCOUNTER — Encounter: Payer: Self-pay | Admitting: Oncology

## 2012-07-06 ENCOUNTER — Telehealth: Payer: Self-pay

## 2012-07-06 ENCOUNTER — Other Ambulatory Visit: Payer: Self-pay | Admitting: Oncology

## 2012-07-06 ENCOUNTER — Other Ambulatory Visit: Payer: Self-pay

## 2012-07-06 ENCOUNTER — Ambulatory Visit: Payer: Medicare Other | Admitting: Radiation Oncology

## 2012-07-06 ENCOUNTER — Telehealth: Payer: Self-pay | Admitting: *Deleted

## 2012-07-06 DIAGNOSIS — D649 Anemia, unspecified: Secondary | ICD-10-CM

## 2012-07-06 NOTE — Telephone Encounter (Signed)
S/w Celene Kras 6/5 at 1015 am.

## 2012-07-06 NOTE — Progress Notes (Signed)
This patient has been set up for IV Feraheme 1020 mg on Thursday, 07/13/2012.  On 07/04/2012 hemoglobin was 9.6, ferritin 16, iron saturation 11%.  The patient has had GI bleeding from her anal carcinoma in the past.

## 2012-07-06 NOTE — Telephone Encounter (Signed)
S/w pt that Dr Arline Asp wants her to have iron IV or PO, her choice. She chose IV due to an easily upset stomach. She will call back after she talks with her son about transportation.

## 2012-07-06 NOTE — Telephone Encounter (Signed)
Per staff message and POF I have scheduled appts.  JMW  

## 2012-07-13 ENCOUNTER — Ambulatory Visit: Payer: Medicare Other | Admitting: Radiation Oncology

## 2012-07-13 ENCOUNTER — Ambulatory Visit (HOSPITAL_BASED_OUTPATIENT_CLINIC_OR_DEPARTMENT_OTHER): Payer: Medicare Other

## 2012-07-13 VITALS — BP 132/74 | HR 90 | Temp 98.0°F

## 2012-07-13 DIAGNOSIS — K625 Hemorrhage of anus and rectum: Secondary | ICD-10-CM

## 2012-07-13 DIAGNOSIS — C210 Malignant neoplasm of anus, unspecified: Secondary | ICD-10-CM

## 2012-07-13 DIAGNOSIS — D649 Anemia, unspecified: Secondary | ICD-10-CM

## 2012-07-13 DIAGNOSIS — D500 Iron deficiency anemia secondary to blood loss (chronic): Secondary | ICD-10-CM

## 2012-07-13 MED ORDER — SODIUM CHLORIDE 0.9 % IV SOLN
Freq: Once | INTRAVENOUS | Status: AC
  Administered 2012-07-13: 11:00:00 via INTRAVENOUS

## 2012-07-13 MED ORDER — FERUMOXYTOL INJECTION 510 MG/17 ML
1020.0000 mg | Freq: Once | INTRAVENOUS | Status: AC
  Administered 2012-07-13: 1020 mg via INTRAVENOUS
  Filled 2012-07-13: qty 34

## 2012-07-13 NOTE — Patient Instructions (Addendum)
Ferumoxytol injection What is this medicine? FERUMOXYTOL is an iron complex. Iron is used to make healthy red blood cells, which carry oxygen and nutrients throughout the body. This medicine is used to treat iron deficiency anemia in people with chronic kidney disease. This medicine may be used for other purposes; ask your health care provider or pharmacist if you have questions. What should I tell my health care provider before I take this medicine? They need to know if you have any of these conditions: -anemia not caused by low iron levels -high levels of iron in the blood -magnetic resonance imaging (MRI) test scheduled -an unusual or allergic reaction to iron, other medicines, foods, dyes, or preservatives -pregnant or trying to get pregnant -breast-feeding How should I use this medicine? This medicine is for infusion into a vein. It is given by a health care professional in a hospital or clinic setting. Talk to your pediatrician regarding the use of this medicine in children. Special care may be needed. Overdosage: If you think you've taken too much of this medicine contact a poison control center or emergency room at once. Overdosage: If you think you have taken too much of this medicine contact a poison control center or emergency room at once. NOTE: This medicine is only for you. Do not share this medicine with others. What if I miss a dose? It is important not to miss your dose. Call your doctor or health care professional if you are unable to keep an appointment. What may interact with this medicine? This medicine may interact with the following medications: -other iron products This list may not describe all possible interactions. Give your health care provider a list of all the medicines, herbs, non-prescription drugs, or dietary supplements you use. Also tell them if you smoke, drink alcohol, or use illegal drugs. Some items may interact with your medicine. What should I watch  for while using this medicine? Visit your doctor or healthcare professional regularly. Tell your doctor or healthcare professional if your symptoms do not start to get better or if they get worse. You may need blood work done while you are taking this medicine. You may need to follow a special diet. Talk to your doctor. Foods that contain iron include: whole grains/cereals, dried fruits, beans, or peas, leafy green vegetables, and organ meats (liver, kidney). What side effects may I notice from receiving this medicine? Side effects that you should report to your doctor or health care professional as soon as possible: -allergic reactions like skin rash, itching or hives, swelling of the face, lips, or tongue -breathing problems -changes in blood pressure -feeling faint or lightheaded, falls -fever or chills -flushing, sweating, or hot feelings -swelling of the ankles or feet Side effects that usually do not require medical attention (Report these to your doctor or health care professional if they continue or are bothersome.): -diarrhea -headache -nausea, vomiting -stomach pain This list may not describe all possible side effects. Call your doctor for medical advice about side effects. You may report side effects to FDA at 1-800-FDA-1088. Where should I keep my medicine? This drug is given in a hospital or clinic and will not be stored at home. NOTE: This sheet is a summary. It may not cover all possible information. If you have questions about this medicine, talk to your doctor, pharmacist, or health care provider.  2012, [employer]. (10/18/2007 9:48:25 PM) 

## 2012-07-20 NOTE — Telephone Encounter (Signed)
I called pt to verify her potassium dose.

## 2012-08-02 ENCOUNTER — Encounter: Payer: Self-pay | Admitting: Radiation Oncology

## 2012-08-03 ENCOUNTER — Ambulatory Visit
Admission: RE | Admit: 2012-08-03 | Discharge: 2012-08-03 | Disposition: A | Discharge status: Home / Self Care | Payer: Medicare Other | Source: Ambulatory Visit | Attending: Radiation Oncology | Admitting: Radiation Oncology

## 2012-08-03 ENCOUNTER — Encounter: Payer: Self-pay | Admitting: Radiation Oncology

## 2012-08-03 VITALS — BP 151/76 | HR 91 | Temp 98.6°F | Resp 20 | Wt 99.1 lb

## 2012-08-03 DIAGNOSIS — C210 Malignant neoplasm of anus, unspecified: Secondary | ICD-10-CM

## 2012-08-03 HISTORY — DX: Personal history of irradiation: Z92.3

## 2012-08-03 NOTE — Progress Notes (Signed)
Department of Radiation Oncology  Phone:  [phone] Fax:        [phone]   Name: [name] MRN: [id number]  DOB: 1925-11-23  Date: 08/03/2012  Follow Up Visit Note  Diagnosis: Locally advanced anal cancer status post palliative radiation to a total dose of 15 gray in 6 fractions at 2.5 gray per fraction completed 05/19/2012  Interval History: [name] presents today for routine followup.  She is feeling much better. She's gained more weight. She has occasional nausea. She is doing well from a fatigue standpoint. Her appetite is improving as well. She believes perhaps the right inguinal lymph node is gotten smaller. She denies any anal or rectal bleeding. She denies any pain. She is accompanied by her son today. There are no further plans for any treatment from medical oncology.  Allergies:  Allergies  Allergen Reactions  . Ativan (Lorazepam)     Confused and out of it    Medications:  Current Outpatient Prescriptions  Medication Sig Dispense Refill  . acetaminophen (TYLENOL) 500 MG tablet Take 1,000 mg by mouth every 6 (six) hours as needed for pain.      [name] Kitchen amitriptyline (ELAVIL) 50 MG tablet Take 1 tablet (50 mg total) by mouth at bedtime.  30 tablet  0  . aspirin-acetaminophen-caffeine (EXCEDRIN EXTRA STRENGTH) 250-250-65 MG per tablet Take 2 tablets by mouth every 6 (six) hours as needed for pain.       [name] Kitchen atorvastatin (LIPITOR) 20 MG tablet Take 1 tablet (20 mg total) by mouth every evening.  30 tablet  1  . Calcium Carbonate-Vitamin D (CALCIUM 600+D) 600-400 MG-UNIT per tablet Take 1 tablet by mouth daily.        [name] Kitchen docusate sodium (COLACE) 100 MG capsule Take 100 mg by mouth 2 (two) times daily as needed for constipation.      . Ensure Plus (ENSURE PLUS) LIQD Take 237 mLs by mouth 2 (two) times daily as needed (for suboptimal po intake.).      [name] Kitchen esomeprazole (NEXIUM) 40 MG capsule Take 1 capsule (40 mg total) by mouth daily before breakfast.  30 capsule  2  .  leflunomide (ARAVA) 20 MG tablet Take 20 mg by mouth daily.       [name] Kitchen loperamide (IMODIUM) 2 MG capsule Take 2 mg by mouth 4 (four) times daily as needed for diarrhea or loose stools.      . metoprolol succinate (TOPROL-XL) 100 MG 24 hr tablet Take 1 tablet (100 mg total) by mouth every evening. Take with or immediately following a meal.  30 tablet  9  . Multiple Vitamins-Minerals (PRESERVISION AREDS PO) Take 1 capsule by mouth daily.      . ondansetron (ZOFRAN ODT) 8 MG disintegrating tablet 8mg  ODT q4 hours prn nausea  4 tablet  0  . potassium chloride SA (K-DUR,KLOR-CON) 20 MEQ tablet Take one tablet daily for 3 days then one tablet every other day.  30 tablet  3  . predniSONE (DELTASONE) 5 MG tablet Take 5 mg by mouth daily.         No current facility-administered medications for this encounter.    Physical Exam:  Filed Vitals:   08/03/12 1444  BP: 151/76  Pulse: 91  Temp: 98.6 F (37 C)  Resp: 20   she is a pleasant elderly female in no distress sitting comfortably on examining table. Her weight is 99 pounds  IMPRESSION: [name] is a 79 y.o. female with a locally advanced anal cancer status  post palliative radiation with controlled bleeding.  PLAN:  I spoke with [name] and her son today. He had hoped that she would be able to receive radiation every few months to prevent her from bleeding. She is doing so well and she tolerated her first 2 fractions of radiation so poorly that I am her locked and to plan on giving radiation to an asymptomatic person. I think if she does come back with bleeding we could to do very localized field with a couple fractions and see if we can control the bleeding that way. Clearly a big field in big fractions were too much for this elderly woman. I had hoped we would be able to push her through to more curative approach but that is not going to be the case. I think it reasonable to see her back in 3 months evaluate her bleeding and just evaluated her on an  every 3 month basis and try to intervene with just one or 2 fractions of radiation at that time to stop or control bleeding. Her son is in agreement with this plan as is [name]. I will plan on seeing her back in 3 months.    Lurline Hare, MD

## 2012-08-03 NOTE — Progress Notes (Signed)
Follow up s/p rad txs:05/11/12-05/19/12 anal cancer, pelvis &inguinal lymph nodes, in w/c, appetite fair"better than it been" states patient, occasional nausea, fatigue is a little better since Iron infusion 07/13/12, , pale, last visit with Dr.Murinson 07/04/12 Last U/S abdomen 05/24/12,  2:50 PM

## 2012-08-22 ENCOUNTER — Ambulatory Visit: Payer: Medicare Other | Admitting: Cardiology

## 2012-09-01 ENCOUNTER — Other Ambulatory Visit (HOSPITAL_BASED_OUTPATIENT_CLINIC_OR_DEPARTMENT_OTHER): Payer: Medicare Other | Admitting: Lab

## 2012-09-01 ENCOUNTER — Ambulatory Visit (HOSPITAL_BASED_OUTPATIENT_CLINIC_OR_DEPARTMENT_OTHER): Payer: Medicare Other | Admitting: Hematology and Oncology

## 2012-09-01 ENCOUNTER — Other Ambulatory Visit: Payer: Self-pay

## 2012-09-01 VITALS — BP 121/64 | HR 92 | Temp 97.7°F | Resp 18 | Ht 64.0 in | Wt 100.7 lb

## 2012-09-01 DIAGNOSIS — C210 Malignant neoplasm of anus, unspecified: Secondary | ICD-10-CM

## 2012-09-01 DIAGNOSIS — C801 Malignant (primary) neoplasm, unspecified: Secondary | ICD-10-CM

## 2012-09-01 DIAGNOSIS — D649 Anemia, unspecified: Secondary | ICD-10-CM

## 2012-09-01 DIAGNOSIS — K449 Diaphragmatic hernia without obstruction or gangrene: Secondary | ICD-10-CM

## 2012-09-01 LAB — CBC WITH DIFFERENTIAL/PLATELET
BASO%: 0.7 % (ref 0.0–2.0)
EOS%: 6.8 % (ref 0.0–7.0)
Eosinophils Absolute: 0.5 10*3/uL (ref 0.0–0.5)
MCH: 32 pg (ref 25.1–34.0)
MCHC: 33.5 g/dL (ref 31.5–36.0)
MCV: 95.8 fL (ref 79.5–101.0)
MONO%: 11.4 % (ref 0.0–14.0)
NEUT#: 4.9 10*3/uL (ref 1.5–6.5)
RBC: 3.39 10*6/uL — ABNORMAL LOW (ref 3.70–5.45)
RDW: 16.3 % — ABNORMAL HIGH (ref 11.2–14.5)

## 2012-09-01 LAB — LACTATE DEHYDROGENASE (CC13): LDH: 225 U/L (ref 125–245)

## 2012-09-01 LAB — COMPREHENSIVE METABOLIC PANEL (CC13)
ALT: 7 U/L (ref 0–55)
AST: 19 U/L (ref 5–34)
Alkaline Phosphatase: 81 U/L (ref 40–150)
Chloride: 106 mEq/L (ref 98–109)
Creatinine: 0.7 mg/dL (ref 0.6–1.1)
Total Bilirubin: 0.25 mg/dL (ref 0.20–1.20)

## 2012-09-01 LAB — CEA: CEA: 2.4 ng/mL (ref 0.0–5.0)

## 2012-09-01 LAB — IRON AND TIBC CHCC
%SAT: 47 % (ref 21–57)
Iron: 96 ug/dL (ref 41–142)
TIBC: 203 ug/dL — ABNORMAL LOW (ref 236–444)
UIBC: 107 ug/dL — ABNORMAL LOW (ref 120–384)

## 2012-09-01 LAB — FERRITIN CHCC: Ferritin: 543 ng/ml — ABNORMAL HIGH (ref 9–269)

## 2012-09-01 NOTE — Progress Notes (Signed)
CC:   Lurline Hare, M.D. Luis Abed, MD, [facility] Aundra Dubin, M.D. Lorne Skeens. Hoxworth, M.D.  PROBLEM LIST:  1. Squamous cell carcinoma of the anal canal, stage T2 N2, IIIB, with  biopsy of primary tumor carried out on 04/09/2012 and core needle  biopsies of a right inguinal lymph node on 04/12/2012. CT  scan of the chest, abdomen, and pelvis with IV contrast carried out  on 03/16/2009 showed a right inguinal lymph node measuring 4.2 x  3.0 cm. There were right inguinal lymph nodes, the largest of  which measured 2.2 x 2.1 cm. Thus, if we go back to the time of  that CT scan on 03/16/2009, there was concern that the patient had  a malignant process, which has now been confirmed. Thus, it  appears that this patient's cancer of the anal canal goes back  at least to early 2011. CT scan of chest, abdomen, and pelvis with IV  contrast carried out on 04/13/2012 now shows a perianal mass lesion  measuring 3.9 x 5.1 cm on image 109. The right pelvic sidewall  lymph node mass now measures 4.9 x 3.2 cm on image 92 and the right  inguinal lymph node measures 4.8 x 3.0 cm on image 103. These  lymph nodes have increased compared with the previous CT scan from  02/02/2012.  The patient received radiation to the pelvis and inguinal lymph nodes under the direction of Dr. Lurline Hare. She received 15 Gy in 6 fractions over 9 days from 05/11/2012 through 05/19/2012. Radiation treatments needed to be stopped because of GI toxicity and a marked decline in the patient's performance status.   2. Coronary artery disease.  3. History of congestive heart failure.  4. Aortic valve stenosis, moderately severe, with a gradient of 30 mm  and a valve area of 0.83 cm sq from a 2-D echocardiogram on  02/04/2012.  5. Rheumatoid arthritis, severe and deforming.  6. Interstitial lung disease.  7. Mitral regurgitation, mild to moderate.  8. Hypertension.  9. Dyslipidemia.  10. Coronary artery  disease.  11. Large hiatal hernia.  12. Admission to the hospital in late December 2013 for cholecystitis  in association with E coli bacteremia. Percutaneous gallbladder  drainage was carried out.  13. Cholelithiasis as noted on the CT scan of 04/13/2012. There was no  evidence for cholecystitis.   MEDICATIONS:  Reviewed and recorded. Current Outpatient Prescriptions  Medication Sig Dispense Refill  . acetaminophen (TYLENOL) 500 MG tablet Take 1,000 mg by mouth every 6 (six) hours as needed for pain.      [name] Kitchen amitriptyline (ELAVIL) 50 MG tablet Take 1 tablet (50 mg total) by mouth at bedtime.  30 tablet  0  . aspirin-acetaminophen-caffeine (EXCEDRIN EXTRA STRENGTH) 250-250-65 MG per tablet Take 2 tablets by mouth every 6 (six) hours as needed for pain.       [name] Kitchen atorvastatin (LIPITOR) 20 MG tablet Take 1 tablet (20 mg total) by mouth every evening.  30 tablet  1  . Calcium Carbonate-Vitamin D (CALCIUM 600+D) 600-400 MG-UNIT per tablet Take 1 tablet by mouth daily.        [name] Kitchen docusate sodium (COLACE) 100 MG capsule Take 100 mg by mouth 2 (two) times daily as needed for constipation.      . Ensure Plus (ENSURE PLUS) LIQD Take 237 mLs by mouth 2 (two) times daily as needed (for suboptimal po intake.).      [name] Kitchen esomeprazole (NEXIUM) 40 MG capsule Take 1 capsule (  40 mg total) by mouth daily before breakfast.  30 capsule  2  . leflunomide (ARAVA) 20 MG tablet Take 20 mg by mouth daily.       [name] Kitchen loperamide (IMODIUM) 2 MG capsule Take 2 mg by mouth 4 (four) times daily as needed for diarrhea or loose stools.      . metoprolol succinate (TOPROL-XL) 100 MG 24 hr tablet Take 1 tablet (100 mg total) by mouth every evening. Take with or immediately following a meal.  30 tablet  9  . Multiple Vitamins-Minerals (PRESERVISION AREDS PO) Take 1 capsule by mouth daily.      . ondansetron (ZOFRAN ODT) 8 MG disintegrating tablet 8mg  ODT q4 hours prn nausea  4 tablet  0  . potassium chloride SA (K-DUR,KLOR-CON) 20 MEQ  tablet Take one tablet daily for 3 days then one tablet every other day.  30 tablet  3  . predniSONE (DELTASONE) 5 MG tablet Take 5 mg by mouth daily.         No current facility-administered medications for this visit.    SMOKING HISTORY:  The patient has never smoked cigarettes.   HISTORY:  We saw Barbaraann Faster today for followup of her stage IIIB squamous cell carcinoma of the anal canal.  Ms. Diego was last seen by me on 05/25/2012.  She is here today with her son [name], with whom she lives.  Ms. Bertini condition  over the past 6 weeks has basically improved.    The patient says her appetite is okay, she actually gained 2 pounds.   She is not having any nausea or vomiting.  She is not having any diarrhea but actually has some constipation.  She has Colace but she is not taking that.  She is not having any rectal bleeding or rectal pain.  She denies any pain any where.  No shortness of breath.  Her energy remains low.  She is able to get around her home without any walking aids.  She is fairly inactive but she is doing some limited work around the house.    Blood pressure 121/64, pulse 92, temperature 97.7 F (36.5 C), temperature source Oral, resp. rate 18, height 5\' 4"  (1.626 m), weight 100 lb 11.2 oz (45.677 kg). PHYSICAL EXAMINATION:  The patient is in a wheelchair.  She is in no acute distress but looks very frail and weak.  No scleral icterus.  Mouth and pharynx are benign.  There is no peripheral adenopathy palpable in the neck, axillary or supraclavicular areas.  The patient does have palpable lymph node in the right groin measuring about 2 cm across x 1 cm vertically.  I believe this is smaller than it had been.  Lungs are clear to percussion and auscultation.  Cardiac exam regular rhythm with systolic ejection murmur.  Abdomen with the patient sitting is benign.  Extremities:  1 to 2+ edema of both ankles.  She has severe deforming rheumatoid  arthritis involving her hands and fingers.  LABORATORY DATA:    CBC    Component Value Date/Time   WBC 7.2 09/01/2012 0909   WBC 5.4 05/24/2012 1625   WBC 12.8* 02/02/2012 1516   RBC 3.39* 09/01/2012 0909   RBC 3.08* 05/24/2012 1625   RBC 4.15 02/02/2012 1516   HGB 10.9* 09/01/2012 0909   HGB 9.0* 05/24/2012 1625   HGB 12.5 02/02/2012 1516   HCT 32.4* 09/01/2012 0909   HCT 28.2* 05/24/2012 1625   HCT 41.7 02/02/2012 1516  PLT 169 09/01/2012 0909   PLT 157 05/24/2012 1625   MCV 95.8 09/01/2012 0909   MCV 91.6 05/24/2012 1625   MCV 100.4* 02/02/2012 1516   MCH 32.0 09/01/2012 0909   MCH 29.2 05/24/2012 1625   MCH 30.1 02/02/2012 1516   MCHC 33.5 09/01/2012 0909   MCHC 31.9 05/24/2012 1625   MCHC 30.0* 02/02/2012 1516   RDW 16.3* 09/01/2012 0909   RDW 15.4 05/24/2012 1625   LYMPHSABS 0.9 09/01/2012 0909   LYMPHSABS 0.4* 05/24/2012 1625   MONOABS 0.8 09/01/2012 0909   MONOABS 0.8 05/24/2012 1625   EOSABS 0.5 09/01/2012 0909   EOSABS 0.3 05/24/2012 1625   BASOSABS 0.1 09/01/2012 0909   BASOSABS 0.0 05/24/2012 1625   Lab Results  Component Value Date   GLUCOSE 98 09/01/2012   BUN 12.8 09/01/2012   CO2 27 09/01/2012   ALT 7 09/01/2012   AST 19 09/01/2012   LDH 225 07/04/2012   K 3.8 09/01/2012   CREATININE 0.7 09/01/2012    IMAGING STUDIES:  1. CT scans of chest, abdomen, and pelvis with IV contrast were  carried out on 03/16/2009. There was intrathoracic stomach showing  no evidence for obstruction due to a large hiatal hernia. There  was at least 1 calcified gallstone measuring 1.5 cm. Liver was  normal, as were pancreas and kidneys. Abdominal aorta was  calcified without aneurysm. There were colonic diverticula. There  was a right iliac lymph node measuring 4.2 x 3.0 cm. There were  right inguinal lymph nodes, the largest of which measured 2.2 x 2.1  cm.  2. CT scans of chest, abdomen, and pelvis without IV contrast on  02/02/2012 again showed a large 1.5 x 1.7-cm stone within the neck   of a distended gallbladder with some gallbladder wall thickening.  A large hiatal hernia was noted. There was a right external iliac  pelvic sidewall lymphadenopathy, had increased compared with the CT  scans from 03/16/2009. Lymph node measured 4.6 x 2.4 cm, whereas  previously it measured 4.3 x 2.3 cm. Right inguinal  lymphadenopathy measured 4.1 x 2.5 cm. There was no definite  retroperitoneal or mesenteric adenopathy on this noncontrast  examination.  3. CT scan of chest, abdomen, and pelvis with IV contrast on  04/13/2012 showed very large hiatal hernia. There was coronary and  aortic atherosclerosis. A perianal mass lesion was noted measuring  3.9 x 5.1 cm on image 109. Right pelvic sidewall lymph node mass  measured 4.9 x 3.2 cm on image 92. A right inguinal lymph node  mass measured 4.8 x 3.0 cm on image 103. Gallstones were noted  without evidence of cholecystitis.  4. Ultrasound of the abdomen on 05/24/2012 showed gallstones measuring up  to 1.7 cm with borderline gallbladder wall thickening.   IMPRESSION AND PLAN:  Ms. Recchia condition seems to be stable over the past several weeks,; however, she remains to be very frail. Her radiation treatments were stopped after only 15 Gy which were administered almost 4 months ago. The patient has an appointment to see Dr. Michell Heinrich again in October.   I suggested that she try to keep that appointment.   I don't believe that patient will be able to tolerate any systemic treatment. Today we talked about nutritional supplementation and supportive care.  The patient had been on some potassium previously for hypokalemia (20 mEq daily for 3 days and then 20 mEq every other day). Her k is 4.2 today.  The patient will remain on observation  and supportive care and to return in approximately 2 months, at which time she  Will see Dr. Truett Perna.   Hofully the patient will have very indolent disease and may not need to make a decision about  further therapy, giving her performance status and  all over clinical picture.  Zachery Dakins, MD 09/01/2012 10:17 AM

## 2012-09-01 NOTE — Telephone Encounter (Signed)
ENCOUNTER OPENED IN ERROR

## 2012-09-01 NOTE — Telephone Encounter (Signed)
gv and printed apt sched and avs for pt.[name] KitchenMarland KitchenPer Sallye Ober sched with covering provider..no approval to switch to Dr. Truett Perna

## 2012-10-12 ENCOUNTER — Telehealth: Payer: Self-pay | Admitting: Internal Medicine

## 2012-10-12 NOTE — Telephone Encounter (Signed)
Moved 9/11 appt to 9/25 @ 9am lb/CP1. S/w pt she is aware.

## 2012-10-19 ENCOUNTER — Other Ambulatory Visit: Payer: Medicare Other | Admitting: Lab

## 2012-10-19 ENCOUNTER — Ambulatory Visit: Payer: Medicare Other

## 2012-11-01 ENCOUNTER — Other Ambulatory Visit: Payer: Self-pay | Admitting: Medical Oncology

## 2012-11-01 DIAGNOSIS — C210 Malignant neoplasm of anus, unspecified: Secondary | ICD-10-CM

## 2012-11-02 ENCOUNTER — Ambulatory Visit (HOSPITAL_BASED_OUTPATIENT_CLINIC_OR_DEPARTMENT_OTHER): Payer: Medicare Other | Admitting: Internal Medicine

## 2012-11-02 ENCOUNTER — Other Ambulatory Visit (HOSPITAL_BASED_OUTPATIENT_CLINIC_OR_DEPARTMENT_OTHER): Payer: Medicare Other | Admitting: Lab

## 2012-11-02 VITALS — BP 139/79 | HR 93 | Temp 98.3°F | Resp 18 | Ht 64.0 in | Wt 104.1 lb

## 2012-11-02 DIAGNOSIS — C210 Malignant neoplasm of anus, unspecified: Secondary | ICD-10-CM

## 2012-11-02 DIAGNOSIS — Z23 Encounter for immunization: Secondary | ICD-10-CM

## 2012-11-02 LAB — COMPREHENSIVE METABOLIC PANEL (CC13)
ALT: <6 U/L (ref 0–55)
AST: 20 U/L (ref 5–34)
BUN: 10.4 mg/dL (ref 7.0–26.0)
Creatinine: 0.7 mg/dL (ref 0.6–1.1)
Total Bilirubin: 0.36 mg/dL (ref 0.20–1.20)

## 2012-11-02 LAB — CBC WITH DIFFERENTIAL/PLATELET
BASO%: 0.7 % (ref 0.0–2.0)
Basophils Absolute: 0.1 10*3/uL (ref 0.0–0.1)
EOS%: 6.4 % (ref 0.0–7.0)
HCT: 32.8 % — ABNORMAL LOW (ref 34.8–46.6)
LYMPH%: 11.4 % — ABNORMAL LOW (ref 14.0–49.7)
MCH: 33 pg (ref 25.1–34.0)
MCHC: 33.1 g/dL (ref 31.5–36.0)
MCV: 99.8 fL (ref 79.5–101.0)
NEUT%: 72.3 % (ref 38.4–76.8)
Platelets: 204 10*3/uL (ref 145–400)

## 2012-11-02 MED ORDER — PNEUMOCOCCAL VAC POLYVALENT 25 MCG/0.5ML IJ INJ
0.5000 mL | INJECTION | Freq: Once | INTRAMUSCULAR | Status: AC
  Administered 2012-11-02: 0.5 mL via INTRAMUSCULAR
  Filled 2012-11-02: qty 0.5

## 2012-11-02 MED ORDER — INFLUENZA VAC SPLIT QUAD 0.5 ML IM SUSP
0.5000 mL | Freq: Once | INTRAMUSCULAR | Status: AC
  Administered 2012-11-02: 0.5 mL via INTRAMUSCULAR
  Filled 2012-11-02: qty 0.5

## 2012-11-02 NOTE — Progress Notes (Signed)
[facility] OFFICE PROGRESS NOTE  Tula Nakayama, MD Individual [address] [place] [address]  DIAGNOSIS: Anal squamous cell carcinoma - Plan: CBC with Differential, Comprehensive metabolic panel, pneumococcal 23 valent vaccine (PNU-IMMUNE) injection 0.5 mL, influenza vac split quadrivalent PF (FLUARIX) injection 0.5 mL, CANCELED: Pneumococcal polysaccharide vaccine 23-valent greater than or equal to 77yo subcutaneous/IM, CANCELED: Flu vaccine 6-73mo preservative free IM  Chief Complaint  Patient presents with  . Anus squamous cell carcinoma    CURRENT THERAPY:  Observation.  INTERVAL HISTORY: [name] 79 y.o. female a history of anus squamous cell carcinoma presents for followup. She was last seen by Dr. Karel Jarvis on 09/01/2012. On that visit she appeared to be very frail and her radiation treatments were stopped after only 15 GY radiation therapy which were administered almost 4 months prior to that visit. Patient does have followup radiation oncology upon with Dr. Daralene Milch worked in October. Today she is accompanied by 2 family members. He reports gaining 4 pounds since her last visit here she reports continued arthritis pain in her hands but otherwise feels  better than her prior visit.  She denies any hospitalizations or emergency room visits she reports resolution of the diarrhea since she has stopped radiation; however, she continues to take potassium chloride supplementation. She denies palpitations, muscle twitching, chest pain.  MEDICAL HISTORY: Past Medical History  Diagnosis Date  . CAD (coronary artery disease)     2 vessel intervention 2003  . Dyslipidemia   . CHF (congestive heart failure)     single episode  . Mitral regurgitation     mild prolapse anterior & posterior leaflets  . Aortic valve sclerosis     mild moderate calcification, echo, 2009  . HTN (hypertension)     difficult to obtain BP at times.  [name] Kitchen Urethral trauma     bleeding in  hospital  . Rheumatoid arthritis(714.0)     severe - deforming  . Lung disease, interstitial     related to rheumatoid arthritis, also question  of left apical nodule...followed elsewhere..my understanding stabilized  . Carotid artery disease     doppler 12/25/2010,  0-39% bilateral  . Ejection fraction     EF 60%, echo, 11/2007  . Clot     ??? apical clot in the past ?? no longer an issue  . Drug therapy     Prednisone.  . Depression   . GERD (gastroesophageal reflux disease)   . Cataract   . Aortic stenosis     Moderately severe, echo, December, 2013  . Rectal bleed 04/06/2012  . Anemia   . 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 Cancer     rectal  . Edema     May, 2014  . History of radiation therapy 05/11/12-05/19/12    pelvis/inguinal lymphnodes/only able to complete 6 fx   ONCOLOGY HISTORY: Squamous cell carcinoma of the anal canal, states T2 and 2, 3B, with biopsy of primary tumor carried out on 04/09/2012 and core needle biopsies of a right inguinal lymph node on 04/12/2012. CT scan of the chest, abdomen, and pelvis with IV contrast carried out on 04/13/2009 showed a right inguinal lymph node measuring 4.2 x 3.0 cm. There were right inguinal lymph nodes, the largest of which measured 2.2 x 2.1 cm. If we go back to the time that the CT scan on 03/16/2009, there was concern that the patient had a malignant process, which has now been  confirmed. It appears that this patient's cancer of the anal canal goes back at least to early 2011. CT scan of the chest, abdomen, and pelvis with IV contrast carried out on 04/13/2012 now shows pararenal mass lesion measuring 3.9 x 5.1 cm on image 109. The right pelvic sidewall lymph node mass now measures 4.9 x 3.2 cm on image 92 in the right inguinal lymph node measures 4.8 x 3.0 cm on image 103. These lymph nodes have increased compared with prior previous CT scans from 02/02/2012. The patient received radiation to the pelvis  and inguinal lymph nodes under the direction of Dr. Lurline Hare. She received 15 Gy in 6 fractions over 9 days from 05/11/2012 through 05/19/2012. Radiation treatments needed to be stopped because of GI toxicity and a marked decline in the patient's performance status.  INTERIM HISTORY: has HYPERTENSION; Mitral regurgitation; Carotid artery disease; Drug therapy; Clot; Ejection fraction; Lung disease, interstitial; CHF (congestive heart failure); Dyslipidemia; CAD (coronary artery disease); Rheumatoid arthritis on chronic steroids; Hiatal hernia; Lymphadenopathy, abdominal; Cholelithiasis; Acute cholecystitis; Hypokalemia; Normocytic anemia; Bacteremia due to Escherichia coli; Aortic stenosis; Bright red blood per rectum; Anal squamous cell carcinoma; Cancer; Edema; and Anemia, unspecified on her problem list.    ALLERGIES:  is allergic to ativan.  MEDICATIONS: has a current medication list which includes the following prescription(s): acetaminophen, amitriptyline, aspirin-acetaminophen-caffeine, atorvastatin, calcium carbonate-vitamin d, docusate sodium, ensure plus, esomeprazole, leflunomide, loperamide, metoprolol succinate, multiple vitamins-minerals, ondansetron, potassium chloride sa, and prednisone.  SURGICAL HISTORY:  Past Surgical History  Procedure Laterality Date  . Appendectomy    . Breast surgery    . Coronary angioplasty with stent placement  2003  . Colonoscopy N/A 04/08/2012    Procedure: COLONOSCOPY;  Surgeon: Vertell Novak., MD;  Location[facility] ENDOSCOPY;  Service: Endoscopy;  Laterality: N/A;  . Lymph node biopsy  04/12/12    right inguinal lymph node-squamous cell carcinoma    REVIEW OF SYSTEMS:   Constitutional: Denies fevers, chills or abnormal weight loss Eyes: Denies blurriness of vision Ears, nose, mouth, throat, and face: Denies mucositis or sore throat Respiratory: Denies cough, dyspnea or wheezes Cardiovascular: Denies palpitation, chest discomfort or lower  extremity swelling Gastrointestinal:  Denies nausea, heartburn or change in bowel habits Skin: Denies abnormal skin rashes Lymphatics: Denies new lymphadenopathy or easy bruising Neurological:Denies numbness, tingling or new weaknesses Behavioral/Psych: Mood is stable, no new changes  All other systems were reviewed with the patient and are negative.  PHYSICAL EXAMINATION: ECOG PERFORMANCE STATUS: 2 - Symptomatic, <50% confined to bed  Blood pressure 139/79, pulse 93, temperature 98.3 F (36.8 C), temperature source Oral, resp. rate 18, height 5\' 4"  (1.626 m), weight 104 lb 1.6 oz (47.219 kg).  GENERAL:alert, no distress and comfortable SKIN: skin color, texture, turgor are normal, no rashes or significant lesions EYES: normal, Conjunctiva are pink and non-injected, sclera clear OROPHARYNX:no exudate, no erythema and lips, buccal mucosa, and tongue normal  NECK: supple, thyroid normal size, non-tender, without nodularity LYMPH:  no palpable lymphadenopathy in the cervical, axillary or supraclavicular.  R groin adenopathy as noted on prior exams. LUNGS: clear to auscultation and percussion with normal breathing effort HEART: regular rate & rhythm and a systolic ejection murmur grade 2/6 and no lower extremity edema ABDOMEN:abdomen soft, non-tender and normal bowel sounds Musculoskeletal:no cyanosis of digits and no clubbing; severe deforming rheumatoid arthritis in the hands bilaterally NEURO: alert & oriented x 3 with fluent speech, no focal motor/sensory deficits   LABORATORY DATA: Results for orders  placed in visit on 11/02/12 (from the past 48 hour(s))  CBC WITH DIFFERENTIAL     Status: Abnormal   Collection Time    11/02/12  9:08 AM      Result Value Range   WBC 8.1  3.9 - 10.3 10e3/uL   NEUT# 5.9  1.5 - 6.5 10e3/uL   HGB 10.9 (*) 11.6 - 15.9 g/dL   HCT 16.1 (*) 09.6 - 04.5 %   Platelets 204  145 - 400 10e3/uL   MCV 99.8  79.5 - 101.0 fL   MCH 33.0  25.1 - 34.0 pg   MCHC  33.1  31.5 - 36.0 g/dL   RBC 4.09 (*) 8.11 - 9.14 10e6/uL   RDW 14.1  11.2 - 14.5 %   lymph# 0.9  0.9 - 3.3 10e3/uL   MONO# 0.8  0.1 - 0.9 10e3/uL   Eosinophils Absolute 0.5  0.0 - 0.5 10e3/uL   Basophils Absolute 0.1  0.0 - 0.1 10e3/uL   NEUT% 72.3  38.4 - 76.8 %   LYMPH% 11.4 (*) 14.0 - 49.7 %   MONO% 9.2  0.0 - 14.0 %   EOS% 6.4  0.0 - 7.0 %   BASO% 0.7  0.0 - 2.0 %  COMPREHENSIVE METABOLIC PANEL (CC13)     Status: Abnormal   Collection Time    11/02/12  9:08 AM      Result Value Range   Sodium 144  136 - 145 mEq/L   Potassium 4.3  3.5 - 5.1 mEq/L   Chloride 110 (*) 98 - 109 mEq/L   CO2 24  22 - 29 mEq/L   Glucose 98  70 - 140 mg/dl   BUN 78.2  7.0 - 95.6 mg/dL   Creatinine 0.7  0.6 - 1.1 mg/dL   Total Bilirubin 2.13  0.20 - 1.20 mg/dL   Alkaline Phosphatase 79  40 - 150 U/L   AST 20  5 - 34 U/L   ALT <6  0 - 55 U/L   Total Protein 6.5  6.4 - 8.3 g/dL   Albumin 3.3 (*) 3.5 - 5.0 g/dL   Calcium 9.1  8.4 - 08.6 mg/dL      RADIOGRAPHIC STUDIES: CT CHEST, ABDOMEN AND PELVIS WITH CONTRAST 04/13/2012 Technique: Multidetector CT imaging of the chest, abdomen and pelvis was performed following the standard protocol during bolus administration of intravenous contrast.  Contrast: 80mL OMNIPAQUE IOHEXOL 300 MG/ML SOLN  Comparison: CT chest, abdomen and pelvis 02/02/2012.  CT CHEST  Findings: The thyroid gland is heterogeneous and mildly enlarged. No axillary, hilar or mediastinal lymphadenopathy is identified. The patient has a massive hiatal hernia containing the entire stomach. Calcific coronary and aortic atherosclerosis is noted.  Small calcified pleural plaques are noted bilaterally. There is  some right basilar atelectasis. No nodule or mass is identified. No lytic or sclerotic bony lesion is identified. Thoracic spondylosis is noted. Severe degenerative change is present about the right shoulder.  IMPRESSION:  1. Negative for metastatic or acute disease.  2. Very large  hiatal hernia.  3. Coronary and aortic atherosclerosis.  CT ABDOMEN AND PELVIS  Findings: Gallstones are seen without evidence of cholecystitis. The liver, biliary tree, adrenal glands, spleen, pancreas and right kidney appear normal.  The patient has a perianal mass lesion measuring 3.9 x 5.1 cm on image 109. As seen on the prior study, there is a right inguinal lymph node mass measuring 4.8 x 3.0 cm on image 103, increased from 2.5 x 4. cm  on the prior study. Right pelvic sidewall lymph node mass measures 4.9 x 3.2 cm on image 92 compared to 2.4 x 4.6 cm.  No other lymphadenopathy is identified. There is no fluid  collection. No lytic or sclerotic lesion is seen.  IMPRESSION:  1. Anal mass consistent with known carcinoma. Metastatic  lymphadenopathy in the right groin and right pelvic sidewall is identified and has worsened since the prior study.  2. Gallstone without evidence of cholecystitis.   ASSESSMENT: 1. Squamous cell carcinoma of the and anal canal, stage TII and 2, 3B, with biopsy of the primary tumor carried out on 04/09/2012 and core needle biopsies of a right inguinal lymph node on April 12 2012. Current treatment secondary to poor functional status, decrease by mouth intake.   PLAN:  --Ms. Azucena Kuba is condition appears to be improving since her last visit. However she remains very frail. She is scheduled to see Dr. Michell Heinrich next month.  Given her performance status and her indolent disease, treatment decisions will be continuously weighed measuring the risks versus the benefits. We had a prolonged discussion concerning some of the side effects of her recent treatments, i.e., fatigue secondary to radiation therapy. She will discuss her next steps with the radiation oncologist.    --Regarding her hypokalemia.  Discussed with the patient that this could be attributed to her decreased by mouth intake and or diarrhea associated with prior therapy. Today her potassium level is 4.3; therefore  we suggested she continue her current potassium chloride regimen.  --All questions were answered. The patient knows to call the clinic with any problems, questions or concerns. We can certainly see the patient much sooner if necessary. Patient was provided after visit summary today's visit. Patient will return to clinic in 2 months with repeat complete blood count and chemistries.  I spent 15 minutes counseling the patient face to face. The total time spent in the appointment was 25 minutes.    Luqman Perrelli, MD 11/03/2012 2:52 PM

## 2012-11-03 ENCOUNTER — Telehealth: Payer: Self-pay | Admitting: Internal Medicine

## 2012-11-03 NOTE — Telephone Encounter (Signed)
s.w. pt and advised on 11.26.14 appt...pt ok and aware

## 2012-11-09 ENCOUNTER — Ambulatory Visit: Payer: Medicare Other | Admitting: Radiation Oncology

## 2012-11-16 ENCOUNTER — Ambulatory Visit
Admission: RE | Admit: 2012-11-16 | Discharge: 2012-11-16 | Disposition: A | Discharge status: Home / Self Care | Payer: Medicare Other | Source: Ambulatory Visit | Attending: Radiation Oncology | Admitting: Radiation Oncology

## 2012-11-16 VITALS — BP 112/85 | HR 96 | Temp 98.4°F | Wt 104.2 lb

## 2012-11-16 DIAGNOSIS — C210 Malignant neoplasm of anus, unspecified: Secondary | ICD-10-CM

## 2012-11-16 NOTE — Progress Notes (Addendum)
Department of Radiation Oncology  Phone:  [phone] Fax:        [phone]   Name: [name] MRN: [id number]  DOB: 11/27/1925  Date: 11/16/2012  Follow Up Visit Note  Diagnosis: Locally advanced anal cancer status post palliative radiation to a total dose of 15 gray in 6 fractions at 2.5 gray per fraction completed 05/19/2012  Interval History: [name] presents today for routine followup.  She is feeling much better. She's gained more weight. Her son and daughter-in-law feel as though she is the best she is looked in about a year. She does complain of increasing difficulties with her "hemorrhoids" she notes pain with bowel movements. She denies any bleeding or incontinence of stool. She denies any pelvic pain. She's not taking any medications for this. She has had no imaging recently. Last labs performed 2 weeks ago showed her hemoglobin was stable at 10.9.  Allergies:  Allergies  Allergen Reactions  . Ativan [Lorazepam]     Confused and out of it    Medications:  Current Outpatient Prescriptions  Medication Sig Dispense Refill  . acetaminophen (TYLENOL) 500 MG tablet Take 1,000 mg by mouth every 6 (six) hours as needed for pain.      [name] Kitchen amitriptyline (ELAVIL) 50 MG tablet Take 1 tablet (50 mg total) by mouth at bedtime.  30 tablet  0  . aspirin-acetaminophen-caffeine (EXCEDRIN EXTRA STRENGTH) 250-250-65 MG per tablet Take 2 tablets by mouth every 6 (six) hours as needed for pain.       [name] Kitchen atorvastatin (LIPITOR) 20 MG tablet Take 1 tablet (20 mg total) by mouth every evening.  30 tablet  1  . Calcium Carbonate-Vitamin D (CALCIUM 600+D) 600-400 MG-UNIT per tablet Take 1 tablet by mouth daily.        [name] Kitchen docusate sodium (COLACE) 100 MG capsule Take 100 mg by mouth 2 (two) times daily as needed for constipation.      . Ensure Plus (ENSURE PLUS) LIQD Take 237 mLs by mouth 2 (two) times daily as needed (for suboptimal po intake.).      [name] Kitchen esomeprazole (NEXIUM) 40 MG capsule  Take 1 capsule (40 mg total) by mouth daily before breakfast.  30 capsule  2  . leflunomide (ARAVA) 20 MG tablet Take 20 mg by mouth daily.       [name] Kitchen loperamide (IMODIUM) 2 MG capsule Take 2 mg by mouth 4 (four) times daily as needed for diarrhea or loose stools.      . metoprolol succinate (TOPROL-XL) 100 MG 24 hr tablet Take 1 tablet (100 mg total) by mouth every evening. Take with or immediately following a meal.  30 tablet  9  . Multiple Vitamins-Minerals (PRESERVISION AREDS PO) Take 1 capsule by mouth daily.      . ondansetron (ZOFRAN ODT) 8 MG disintegrating tablet 8mg  ODT q4 hours prn nausea  4 tablet  0  . predniSONE (DELTASONE) 5 MG tablet Take 5 mg by mouth daily.        . potassium chloride SA (K-DUR,KLOR-CON) 20 MEQ tablet Take one tablet daily for 3 days then one tablet every other day.  30 tablet  3   No current facility-administered medications for this encounter.    Physical Exam:  Filed Vitals:   11/16/12 1354  BP: 112/85  Pulse: 96  Temp: 98.4 F (36.9 C)   she is a pleasant elderly female in no distress sitting comfortably on examining table. Her weight is up 5 pounds to 104 pounds.  She has recurrent tumor at the anal verge. She has pain with palpation to this area.  IMPRESSION: [name] is a 79 y.o. female with a locally advanced anal cancer status post palliative radiation with controlled bleeding but progressive pain.  PLAN:  I spoke with [name] and her son today. We discussed treatment now versus later. She was so not down and anemic after bleeding for so long from her uncontrolled disease at the beginning that I feel like they want to be proactive. She was knocked down so far however from her last treatment that we certainly don't want that to happen this time. I believe we can treat a very localized field to the anal area and try to provide palliative relief of her pain. This may also provide additional protection against bleeding. We discussed waiting until she had  symptomatic bleeding but [name] would like to proceed forward. Would like to give her a little bit more time to recover in gained some more weight. I've scheduled her for simulation on the 21st. Also due to her age and her poor tolerance of treatment I think it's reasonable to treat her for 3 fractions one week and then have the weekend and 4 days off and then treat 2 fractions the following week. Hopefully we can provide another 6 months of palliation to this area with minimal side effects for her. She evidently knows the side effects treatment having experienced the worst of them during her last treatment. I encouraged him to continue her nutritional supplementation. I will see her for simulation on the 21st. She is obviously too frail and tolerated radiation alone so poorly that concurrent chemotherapy is not indicated at this time.   Lurline Hare, MD

## 2012-11-16 NOTE — Progress Notes (Signed)
Patient for routine follow up anal cancer treatment 05/19/12.Doing well except occasional flare-up of hemorrhoid.Last bowel movement this morning was of soft consistency.Last CT scan 04/13/12.

## 2012-11-28 ENCOUNTER — Ambulatory Visit
Admission: RE | Admit: 2012-11-28 | Discharge: 2012-11-28 | Disposition: A | Discharge status: Home / Self Care | Payer: Medicare Other | Source: Ambulatory Visit | Attending: Radiation Oncology | Admitting: Radiation Oncology

## 2012-11-28 DIAGNOSIS — Z510 Encounter for antineoplastic radiation therapy: Secondary | ICD-10-CM | POA: Insufficient documentation

## 2012-11-28 DIAGNOSIS — C210 Malignant neoplasm of anus, unspecified: Secondary | ICD-10-CM | POA: Insufficient documentation

## 2012-11-28 DIAGNOSIS — K6289 Other specified diseases of anus and rectum: Secondary | ICD-10-CM | POA: Insufficient documentation

## 2012-11-28 NOTE — Progress Notes (Signed)
Name: [name] MRN: [id number]  Date: 11/28/12 DOB: [birth year]  Status:outpatient  DIAGNOSIS: Locally advanced anal cancer  CONSENT VERIFIED: yes  SET UP: Patient is setup supine  IMMOBILIZATION: The following immobilization was used: Vac Loc   NARRATIVE: Pt [name] was brought to the CT [employer] banker, corporate. Identity was confirmed. All relevant records and images related to the planned course of therapy were reviewed. Then, the patient was positioned in a stable reproducible clinical set-up for radiation therapy. CT images were obtained. An isocenter was placed. Skin markings were placed. The CT images were loaded into the planning software where the target and avoidance structures were contoured. The radiation prescription was entered and confirmed. The patient was discharged in stable condition and tolerated simulation well.   TREATMENT PLANNING NOTE:  Treatment planning then occurred.  I have requested : MLC's, isodose plan, basic dose calculation  I have requested 3 dimensional simulation with DVH of cord, kidneys, bowel and GTV   I personally supervised and approved the construction of 3 medically necessary complex treatment devices.  Special treatment procedure is performed as she has been treated previously in this area.

## 2012-12-11 ENCOUNTER — Ambulatory Visit
Admission: RE | Admit: 2012-12-11 | Discharge: 2012-12-11 | Disposition: A | Discharge status: Home / Self Care | Payer: Medicare Other | Source: Ambulatory Visit | Attending: Radiation Oncology | Admitting: Radiation Oncology

## 2012-12-12 ENCOUNTER — Ambulatory Visit: Payer: Medicare Other

## 2012-12-13 ENCOUNTER — Ambulatory Visit
Admission: RE | Admit: 2012-12-13 | Discharge: 2012-12-13 | Disposition: A | Discharge status: Home / Self Care | Payer: Medicare Other | Source: Ambulatory Visit | Attending: Radiation Oncology | Admitting: Radiation Oncology

## 2012-12-13 ENCOUNTER — Ambulatory Visit: Payer: Medicare Other

## 2012-12-14 ENCOUNTER — Ambulatory Visit: Payer: Medicare Other

## 2012-12-15 ENCOUNTER — Ambulatory Visit
Admission: RE | Admit: 2012-12-15 | Discharge: 2012-12-15 | Disposition: A | Discharge status: Home / Self Care | Payer: Medicare Other | Source: Ambulatory Visit | Attending: Radiation Oncology | Admitting: Radiation Oncology

## 2012-12-15 ENCOUNTER — Ambulatory Visit: Payer: Medicare Other

## 2012-12-18 ENCOUNTER — Ambulatory Visit: Payer: Medicare Other

## 2012-12-19 ENCOUNTER — Ambulatory Visit: Payer: Medicare Other

## 2012-12-19 ENCOUNTER — Ambulatory Visit
Admission: RE | Admit: 2012-12-19 | Discharge: 2012-12-19 | Disposition: A | Discharge status: Home / Self Care | Payer: Medicare Other | Source: Ambulatory Visit | Attending: Radiation Oncology | Admitting: Radiation Oncology

## 2012-12-19 DIAGNOSIS — C210 Malignant neoplasm of anus, unspecified: Secondary | ICD-10-CM

## 2012-12-19 NOTE — Progress Notes (Signed)
Weekly Management Note Current Dose: 7.5 Gy  Projected Dose:12.5 Gy   Narrative:  The patient presents for routine under treatment assessment.  CBCT/MVCT images/Port film x-rays were reviewed.  The chart was checked. Doing ok. Had hard stool on Friday and now has anal pain. Does not want to take pain medications. Asked about skipping treatment today. Daughter and son present.   Physical Findings:  Unchanged  Vitals: There were no vitals filed for this visit. Weight:  Wt Readings from Last 3 Encounters:  11/16/12 104 lb 3.2 oz (47.265 kg)  11/02/12 104 lb 1.6 oz (47.219 kg)  09/01/12 100 lb 11.2 oz (45.677 kg)   Lab Results  Component Value Date   WBC 8.1 11/02/2012   HGB 10.9* 11/02/2012   HCT 32.8* 11/02/2012   MCV 99.8 11/02/2012   PLT 204 11/02/2012   Lab Results  Component Value Date   CREATININE 0.7 11/02/2012   BUN 10.4 11/02/2012   NA 144 11/02/2012   K 4.3 11/02/2012   CL 108* 07/04/2012   CO2 24 11/02/2012     Impression:  The patient is tolerating radiation with some anal irritation likely from treatment affect  Plan:  Continue treatment as planned. She elected to continue today. She has some topical medications that she will use to help with the pain. We talked about increasing her fiber and water consumtion to ensure soft stools.  She can use OTC meds for pain for now.

## 2012-12-20 ENCOUNTER — Ambulatory Visit: Payer: Medicare Other

## 2012-12-21 ENCOUNTER — Encounter: Payer: Self-pay | Admitting: Radiation Oncology

## 2012-12-21 ENCOUNTER — Ambulatory Visit: Payer: Medicare Other

## 2012-12-21 ENCOUNTER — Ambulatory Visit
Admission: RE | Admit: 2012-12-21 | Discharge: 2012-12-21 | Disposition: A | Discharge status: Home / Self Care | Payer: Medicare Other | Source: Ambulatory Visit | Attending: Radiation Oncology | Admitting: Radiation Oncology

## 2012-12-26 NOTE — Progress Notes (Signed)
°  Radiation Oncology         [phone] ________________________________  Name: [name] MRN: [id number]  Date: 12/21/2012  DOB: 10/02/1925  End of Treatment Note  Diagnosis:   Locally advanced rectal cancer     Indication for treatment:  Palliative       Radiation treatment dates:   12/11/2012-12/21/2012  Site/dose:   Rectum and anus / 12.5 Gy at 2.5 Gy per fraction x 5 fractions delivered every other day  Beams/energy:   AP/PA with 15 MV photons  Narrative: The patient tolerated radiation treatment relatively well.   She had pain and discomfort at her anus which was expected and treated with stool softeners and tolical lidocaine.   Plan: The patient has completed radiation treatment. The patient will return to radiation oncology clinic for routine followup in one month. I advised them to call or return sooner if they have any questions or concerns related to their recovery or treatment.  ------------------------------------------------  Lurline Hare, MD

## 2013-01-01 ENCOUNTER — Telehealth: Payer: Self-pay | Admitting: Internal Medicine

## 2013-01-01 NOTE — Telephone Encounter (Signed)
pt son called to r/s appts...done

## 2013-01-03 ENCOUNTER — Ambulatory Visit: Payer: Medicare Other

## 2013-01-03 ENCOUNTER — Other Ambulatory Visit: Payer: Medicare Other | Admitting: Lab

## 2013-01-23 ENCOUNTER — Ambulatory Visit (INDEPENDENT_AMBULATORY_CARE_PROVIDER_SITE_OTHER): Payer: Medicare Other | Admitting: Podiatry

## 2013-01-23 ENCOUNTER — Encounter: Payer: Self-pay | Admitting: *Deleted

## 2013-01-23 ENCOUNTER — Other Ambulatory Visit: Payer: Self-pay | Admitting: Podiatry

## 2013-01-23 ENCOUNTER — Encounter: Payer: Self-pay | Admitting: Podiatry

## 2013-01-23 ENCOUNTER — Other Ambulatory Visit: Payer: Self-pay | Admitting: *Deleted

## 2013-01-23 VITALS — BP 101/67 | HR 86 | Temp 98.7°F | Resp 12

## 2013-01-23 DIAGNOSIS — L02619 Cutaneous abscess of unspecified foot: Secondary | ICD-10-CM

## 2013-01-23 DIAGNOSIS — M069 Rheumatoid arthritis, unspecified: Secondary | ICD-10-CM

## 2013-01-23 MED ORDER — AMOXICILLIN-POT CLAVULANATE 500-125 MG PO TABS: 1.0000 | ORAL_TABLET | Freq: Three times a day (TID) | ORAL | Status: DC

## 2013-01-23 MED ORDER — MUPIROCIN 2 % EX OINT: 1.0000 "application " | TOPICAL_OINTMENT | Freq: Two times a day (BID) | CUTANEOUS | Status: DC

## 2013-01-23 NOTE — Progress Notes (Deleted)
   Subjective:    Patient ID: [name], female    DOB: 01/01/1926, 79 y.o.   MRN: [id number]  HPI Comments: '' TOENAILS TRIM.''     Review of Systems     Objective:   Physical Exam        Assessment & Plan:

## 2013-01-24 NOTE — Progress Notes (Signed)
[name] presents today with her son as an 79 year old white female chief complaint of severe pain to the plantar aspect of her left foot. She's complaining of severe pain is been going on for the past few days to the plantar aspect of her left foot. She denies any trauma. Does have a history of rheumatoid arthritis with a callus that is usually present there. She usually presents for callus and nail debridement.  Objective: Vital signs are stable she is alert and oriented x3 pulses are palpable bilateral. Large plantar abscess to the forefoot left beneath the third fourth and fifth metatarsals of the left foot. The toes are severely dorsiflex metatarsals were plantar flexed resulting in reactive hyperkeratosis. This reactive hyperkeratosis has abscess and was debrided measures 3 cm x 4 cm with a central Midas beneath the fourth metatarsal probing deep toward bone. Radiographs are unable to be taken today do to the fact that she cannot standing keep her balance. We incised and drained the abscess today samples of this were taken for culture and sensitivity. Cellulitis extends through the plantar medial arch to the level of the medial malleolus the margin was marked today with a Sharpey marker.  Assessment: Abscess plantar aspect left foot. With cellulitis.  Plan: I and D left foot. Started her on Augmentin. Epsom salts warm water soaks twice daily with application of Bactroban ointment and a dry sterile compressive dressing. She'll also ambulate in a surgical shoe. I gave her some specific instructions, should she develop fever chills nausea vomiting muscle aches or pains or should the area of redness extends past the marked area on her ankle they should immediately seek medical attention from they emergency department. I will followup with her in one to 2 weeks.

## 2013-01-26 LAB — CULTURE, ROUTINE-ABSCESS

## 2013-01-29 ENCOUNTER — Telehealth: Payer: Self-pay | Admitting: *Deleted

## 2013-01-29 NOTE — Telephone Encounter (Signed)
Message copied by Marissa Nestle on Mon Jan 29, 2013  5:09 PM ------      Message from: Ernestene Kiel T      Created: Mon Jan 29, 2013  5:01 PM       Make sure call the patient check on her progress.  If not improving then we should change her to Clindamycin.  Talk with me about this tomorrow. ------

## 2013-01-29 NOTE — Telephone Encounter (Addendum)
I asked pt the status of her foot.  She states it still hurts, but it's not as red.  I told her I would advise Dr Al Corpus and call if there was a change in therapy.  01/30/2013, Dr Al Corpus changed pt's antibiotic to Clindamycin 150mg  #30 one capsule po tid.  I informed the pt of the change and ordered electronically to [facility].

## 2013-01-29 NOTE — Telephone Encounter (Signed)
Change her to clindamycin 150 mg Tid X 10 days.  I will discuss it with you tomorrow.

## 2013-01-30 ENCOUNTER — Encounter: Payer: Self-pay | Admitting: *Deleted

## 2013-01-30 MED ORDER — CLINDAMYCIN HCL 150 MG PO CAPS: 150.0000 mg | ORAL_CAPSULE | Freq: Three times a day (TID) | ORAL | Status: DC

## 2013-02-05 ENCOUNTER — Ambulatory Visit: Payer: Medicare Other | Admitting: Podiatry

## 2013-02-06 ENCOUNTER — Ambulatory Visit (HOSPITAL_BASED_OUTPATIENT_CLINIC_OR_DEPARTMENT_OTHER): Payer: Medicare Other | Admitting: Internal Medicine

## 2013-02-06 ENCOUNTER — Telehealth: Payer: Self-pay | Admitting: Internal Medicine

## 2013-02-06 ENCOUNTER — Other Ambulatory Visit (HOSPITAL_BASED_OUTPATIENT_CLINIC_OR_DEPARTMENT_OTHER): Payer: Medicare Other

## 2013-02-06 VITALS — BP 116/68 | HR 91 | Temp 98.1°F | Resp 18 | Ht 64.0 in | Wt 105.7 lb

## 2013-02-06 DIAGNOSIS — C211 Malignant neoplasm of anal canal: Secondary | ICD-10-CM

## 2013-02-06 DIAGNOSIS — C210 Malignant neoplasm of anus, unspecified: Secondary | ICD-10-CM

## 2013-02-06 DIAGNOSIS — D649 Anemia, unspecified: Secondary | ICD-10-CM

## 2013-02-06 LAB — CBC WITH DIFFERENTIAL/PLATELET
Basophils Absolute: 0 10*3/uL (ref 0.0–0.1)
EOS%: 8 % — ABNORMAL HIGH (ref 0.0–7.0)
HCT: 31.2 % — ABNORMAL LOW (ref 34.8–46.6)
HGB: 10.3 g/dL — ABNORMAL LOW (ref 11.6–15.9)
MCH: 32.5 pg (ref 25.1–34.0)
MCHC: 33.1 g/dL (ref 31.5–36.0)
MCV: 98.1 fL (ref 79.5–101.0)
MONO%: 9.6 % (ref 0.0–14.0)
NEUT%: 72.8 % (ref 38.4–76.8)
Platelets: 228 10*3/uL (ref 145–400)
RBC: 3.18 10*6/uL — ABNORMAL LOW (ref 3.70–5.45)
lymph#: 0.6 10*3/uL — ABNORMAL LOW (ref 0.9–3.3)

## 2013-02-06 LAB — COMPREHENSIVE METABOLIC PANEL (CC13)
AST: 16 U/L (ref 5–34)
Albumin: 3.1 g/dL — ABNORMAL LOW (ref 3.5–5.0)
Anion Gap: 10 mEq/L (ref 3–11)
BUN: 8.4 mg/dL (ref 7.0–26.0)
Calcium: 8.8 mg/dL (ref 8.4–10.4)
Chloride: 106 mEq/L (ref 98–109)
Creatinine: 0.7 mg/dL (ref 0.6–1.1)
Potassium: 4.1 mEq/L (ref 3.5–5.1)
Total Protein: 6.2 g/dL — ABNORMAL LOW (ref 6.4–8.3)

## 2013-02-06 NOTE — Telephone Encounter (Signed)
[facility] PT APPT SCHEDULE FOR MARCH °

## 2013-02-07 NOTE — Progress Notes (Signed)
[facility] OFFICE PROGRESS NOTE  Mar[place]ayama, MD Individual [address] [place] [address]  DIAGNOSIS: Anal squamous cell carcinoma - Plan: CBC with Differential, Comprehensive metabolic panel (Cmet) - [facility], Lactate dehydrogenase (LDH) - [facility]  Normocytic anemia - Plan: CBC with Differential, Comprehensive metabolic panel (Cmet) - [facility], Lactate dehydrogenase (LDH) - [facility]  Chief Complaint  Patient presents with  . Anal squamous cell carcinoma    CURRENT THERAPY:  Observation.  INTERVAL HISTORY: [name] 79 y.o. female a history of anus squamous cell carcinoma presents for followup. She was last seen by Dr. Karel Jarvis on 09/01/2012. On that visit she appeared to be very frail and her radiation treatments were stopped after only 15 GY radiation therapy which were administered almost 4 months prior to that visit. Patient did have followup radiation oncology upon with Dr. Michell Heinrich worked in October.  She a brief course of radiation focally and tolerated this well overall.  She noted soreness in the area which has now resolved.   Today she is accompanied by 2 family members. She reports having problems with foot abscesses, one at the bottom of her right foot sole and one her left foot bunion.  They required drainage via an I and D.  This is being managed by her foot doctor.  She is on antibiotics with improvement in her feet.  She reports decreasing pain to area and improvement in its appearance.   Since being on the antibiotic she has had diarrhea intermittently.  In addition she reports having cold-like symptoms including rhinorrhea and cough.    She reports continued arthritis pain in her hands but otherwise feels  better than her prior visit.  She denies any hospitalizations or emergency room visits.  She denies palpitations, muscle twitching, chest pain.  MEDICAL HISTORY: Past Medical History  Diagnosis Date  . CAD (coronary artery disease)     2 vessel  intervention 2003  . Dyslipidemia   . CHF (congestive heart failure)     single episode  . Mitral regurgitation     mild prolapse anterior & posterior leaflets  . Aortic valve sclerosis     mild moderate calcification, echo, 2009  . HTN (hypertension)     difficult to obtain BP at times.  [name] Kitchen Urethral trauma     bleeding in hospital  . Rheumatoid arthritis(714.0)     severe - deforming  . Lung disease, interstitial     related to rheumatoid arthritis, also question  of left apical nodule...followed elsewhere..my understanding stabilized  . Carotid artery disease     doppler 12/25/2010,  0-39% bilateral  . Ejection fraction     EF 60%, echo, 11/2007  . Clot     ??? apical clot in the past ?? no longer an issue  . Drug therapy     Prednisone.  . Depression   . GERD (gastroesophageal reflux disease)   . Cataract   . Aortic stenosis     Moderately severe, echo, December, 2013  . Rectal bleed 04/06/2012  . Anemia   . 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 Cancer     rectal  . Edema     May, 2014  . History of radiation therapy 05/11/12-05/19/12    pelvis/inguinal lymphnodes/only able to complete 6 fx   ONCOLOGY HISTORY: Squamous cell carcinoma of the anal canal, states T2 and 2, 3B, with biopsy of primary tumor carried out on 04/09/2012 and core  needle biopsies of a right inguinal lymph node on 04/12/2012. CT scan of the chest, abdomen, and pelvis with IV contrast carried out on 04/13/2009 showed a right inguinal lymph node measuring 4.2 x 3.0 cm. There were right inguinal lymph nodes, the largest of which measured 2.2 x 2.1 cm. If we go back to the time that the CT scan on 03/16/2009, there was concern that the patient had a malignant process, which has now been confirmed. It appears that this patient's cancer of the anal canal goes back at least to early 2011. CT scan of the chest, abdomen, and pelvis with IV contrast carried out on 04/13/2012 now shows  pararenal mass lesion measuring 3.9 x 5.1 cm on image 109. The right pelvic sidewall lymph node mass now measures 4.9 x 3.2 cm on image 92 in the right inguinal lymph node measures 4.8 x 3.0 cm on image 103. These lymph nodes have increased compared with prior previous CT scans from 02/02/2012. The patient received radiation to the pelvis and inguinal lymph nodes under the direction of Dr. Lurline Hare. She received 15 Gy in 6 fractions over 9 days from 05/11/2012 through 05/19/2012. Radiation treatments needed to be stopped because of GI toxicity and a marked decline in the patient's performance status.  INTERIM HISTORY: has HYPERTENSION; Mitral regurgitation; Carotid artery disease; Drug therapy; Clot; Ejection fraction; Lung disease, interstitial; CHF (congestive heart failure); Dyslipidemia; CAD (coronary artery disease); Rheumatoid arthritis on chronic steroids; Hiatal hernia; Lymphadenopathy, abdominal; Cholelithiasis; Acute cholecystitis; Hypokalemia; Normocytic anemia; Bacteremia due to Escherichia coli; Aortic stenosis; Bright red blood per rectum; Anal squamous cell carcinoma; Cancer; Edema; and Anemia, unspecified on her problem list.    ALLERGIES:  is allergic to ativan.  MEDICATIONS: has a current medication list which includes the following prescription(s): acetaminophen, amitriptyline, aspirin-acetaminophen-caffeine, atorvastatin, calcium carbonate-vitamin d, clindamycin, docusate sodium, ensure plus, esomeprazole, leflunomide, loperamide, metoprolol succinate, multiple vitamins-minerals, mupirocin ointment, ondansetron, potassium chloride sa, and prednisone.  SURGICAL HISTORY:  Past Surgical History  Procedure Laterality Date  . Appendectomy    . Breast surgery    . Coronary angioplasty with stent placement  2003  . Colonoscopy N/A 04/08/2012    Procedure: COLONOSCOPY;  Surgeon: Vertell Novak., MD;  Location: [facility] ENDOSCOPY;  Service: Endoscopy;  Laterality: N/A;  . Lymph node  biopsy  04/12/12    right inguinal lymph node-squamous cell carcinoma    REVIEW OF SYSTEMS:   Constitutional: Denies fevers, chills or abnormal weight loss Eyes: Denies blurriness of vision Ears, nose, mouth, throat, and face: Denies mucositis or sore throat Respiratory: Reports cough but denies dyspnea or wheezes Cardiovascular: Denies palpitation, chest discomfort or lower extremity swelling Gastrointestinal:  Denies nausea, heartburn or change in bowel habits Skin: Denies abnormal skin rashes Lymphatics: Denies new lymphadenopathy or easy bruising Neurological:Denies numbness, tingling or new weaknesses Behavioral/Psych: Mood is stable, no new changes  All other systems were reviewed with the patient and are negative.  PHYSICAL EXAMINATION: ECOG PERFORMANCE STATUS: 2 - Symptomatic, <50% confined to bed  Blood pressure 116/68, pulse 91, temperature 98.1 F (36.7 C), temperature source Oral, resp. rate 18, height 5\' 4"  (1.626 m), weight 105 lb 11.2 oz (47.945 kg).  GENERAL:alert, no distress and comfortable; sitting in wheelchair. Chronically ill appearing.  SKIN: skin color, texture, turgor are normal, no rashes or significant lesions EYES: normal, Conjunctiva are pink and non-injected, sclera clear OROPHARYNX:no exudate, no erythema and lips, buccal mucosa, and tongue normal  NECK: supple, thyroid normal size, non-tender, without  nodularity LYMPH:  no palpable lymphadenopathy in the cervical, axillary or supraclavicular.  R groin adenopathy as noted on prior exams. LUNGS: clear to auscultation and percussion with normal breathing effort HEART: regular rate & rhythm and a systolic ejection murmur grade 2/6 and no lower extremity edema ABDOMEN:abdomen soft, non-tender and normal bowel sounds Musculoskeletal:no cyanosis of digits and no clubbing; severe deforming rheumatoid arthritis in the hands bilaterally; R Foot sole with dime sized area without drainage or discharge with mild TTP;  L foot bunion with mild erythema.  Each area appears free of infection.  NEURO: alert & oriented x 3 with fluent speech, no focal motor/sensory deficits   LABORATORY DATA: Results for orders placed in visit on 02/06/13 (from the past 48 hour(s))  CBC WITH DIFFERENTIAL     Status: Abnormal   Collection Time    02/06/13  8:48 AM      Result Value Range   WBC 6.5  3.9 - 10.3 10e3/uL   NEUT# 4.7  1.5 - 6.5 10e3/uL   HGB 10.3 (*) 11.6 - 15.9 g/dL   HCT 81.1 (*) 91.4 - 78.2 %   Platelets 228  145 - 400 10e3/uL   MCV 98.1  79.5 - 101.0 fL   MCH 32.5  25.1 - 34.0 pg   MCHC 33.1  31.5 - 36.0 g/dL   RBC 9.56 (*) 2.13 - 0.86 10e6/uL   RDW 14.6 (*) 11.2 - 14.5 %   lymph# 0.6 (*) 0.9 - 3.3 10e3/uL   MONO# 0.6  0.1 - 0.9 10e3/uL   Eosinophils Absolute 0.5  0.0 - 0.5 10e3/uL   Basophils Absolute 0.0  0.0 - 0.1 10e3/uL   NEUT% 72.8  38.4 - 76.8 %   LYMPH% 8.9 (*) 14.0 - 49.7 %   MONO% 9.6  0.0 - 14.0 %   EOS% 8.0 (*) 0.0 - 7.0 %   BASO% 0.7  0.0 - 2.0 %  COMPREHENSIVE METABOLIC PANEL (CC13)     Status: Abnormal   Collection Time    02/06/13  8:48 AM      Result Value Range   Sodium 141  136 - 145 mEq/L   Potassium 4.1  3.5 - 5.1 mEq/L   Chloride 106  98 - 109 mEq/L   CO2 24  22 - 29 mEq/L   Glucose 100  70 - 140 mg/dl   BUN 8.4  7.0 - 57.8 mg/dL   Creatinine 0.7  0.6 - 1.1 mg/dL   Total Bilirubin 4.69  0.20 - 1.20 mg/dL   Alkaline Phosphatase 92  40 - 150 U/L   AST 16  5 - 34 U/L   ALT 6  0 - 55 U/L   Total Protein 6.2 (*) 6.4 - 8.3 g/dL   Albumin 3.1 (*) 3.5 - 5.0 g/dL   Calcium 8.8  8.4 - 62.9 mg/dL   Anion Gap 10  3 - 11 mEq/L    RADIOGRAPHIC STUDIES: CT CHEST, ABDOMEN AND PELVIS WITH CONTRAST 04/13/2012 Technique: Multidetector CT imaging of the chest, abdomen and pelvis was performed following the standard protocol during bolus administration of intravenous contrast.  Contrast: 80mL OMNIPAQUE IOHEXOL 300 MG/ML SOLN  Comparison: CT chest, abdomen and pelvis 02/02/2012.  CT  CHEST  Findings: The thyroid gland is heterogeneous and mildly enlarged. No axillary, hilar or mediastinal lymphadenopathy is identified. The patient has a massive hiatal hernia containing the entire stomach. Calcific coronary and aortic atherosclerosis is noted.  Small calcified pleural plaques are noted bilaterally. There  is  some right basilar atelectasis. No nodule or mass is identified. No lytic or sclerotic bony lesion is identified. Thoracic spondylosis is noted. Severe degenerative change is present about the right shoulder.  IMPRESSION:  1. Negative for metastatic or acute disease.  2. Very large hiatal hernia.  3. Coronary and aortic atherosclerosis.  CT ABDOMEN AND PELVIS  Findings: Gallstones are seen without evidence of cholecystitis. The liver, biliary tree, adrenal glands, spleen, pancreas and right kidney appear normal.  The patient has a perianal mass lesion measuring 3.9 x 5.1 cm on image 109. As seen on the prior study, there is a right inguinal lymph node mass measuring 4.8 x 3.0 cm on image 103, increased from 2.5 x 4. cm on the prior study. Right pelvic sidewall lymph node mass measures 4.9 x 3.2 cm on image 92 compared to 2.4 x 4.6 cm.  No other lymphadenopathy is identified. There is no fluid  collection. No lytic or sclerotic lesion is seen.  IMPRESSION:  1. Anal mass consistent with known carcinoma. Metastatic  lymphadenopathy in the right groin and right pelvic sidewall is identified and has worsened since the prior study.  2. Gallstone without evidence of cholecystitis.  ASSESSMENT: 1. Squamous cell carcinoma of the and anal canal, stage TII and 2, 3B, with biopsy of the primary tumor carried out on 04/09/2012 and core needle biopsies of a right inguinal lymph node on April 12 2012. Current treatment held secondary to poor functional status, decrease by mouth intake.   PLAN:  --[name] is condition appears to be improving since her last visit. However she remains  very frail. She is scheduled to see Dr. Michell Heinrich next month.  Given her performance status and her indolent disease, treatment decisions will be continuously weighed measuring the risks versus the benefits.  She will discuss her next steps with the radiation oncologist.    --Regarding her feet abscesses.  She should complete the course of antibiotics which we started based on wound culture sensitivities. Follow up with podiatry is scheduled.   --All questions were answered. The patient knows to call the clinic with any problems, questions or concerns. We can certainly see the patient much sooner if necessary. Patient was provided after visit summary today's visit. Patient will return to clinic in 2-3 months with repeat complete blood count and chemistries.  I spent 15 minutes counseling the patient face to face. The total time spent in the appointment was 25 minutes.    Makynna Manocchio, MD 02/07/2013 5:35 AM

## 2013-02-09 ENCOUNTER — Ambulatory Visit (INDEPENDENT_AMBULATORY_CARE_PROVIDER_SITE_OTHER): Payer: Medicare Other | Admitting: Podiatrist

## 2013-02-09 ENCOUNTER — Ambulatory Visit: Payer: Self-pay

## 2013-02-09 ENCOUNTER — Ambulatory Visit (INDEPENDENT_AMBULATORY_CARE_PROVIDER_SITE_OTHER): Payer: Medicare Other | Admitting: Emergency Medicine

## 2013-02-09 ENCOUNTER — Encounter: Payer: Self-pay | Admitting: Podiatrist

## 2013-02-09 VITALS — BP 101/88 | HR 63 | Temp 98.4°F | Resp 12

## 2013-02-09 VITALS — HR 96 | Temp 99.0°F | Resp 20 | Ht 61.5 in | Wt 104.0 lb

## 2013-02-09 DIAGNOSIS — L02619 Cutaneous abscess of unspecified foot: Secondary | ICD-10-CM

## 2013-02-09 DIAGNOSIS — R059 Cough, unspecified: Secondary | ICD-10-CM

## 2013-02-09 DIAGNOSIS — L03119 Cellulitis of unspecified part of limb: Secondary | ICD-10-CM

## 2013-02-09 DIAGNOSIS — J40 Bronchitis, not specified as acute or chronic: Secondary | ICD-10-CM

## 2013-02-09 DIAGNOSIS — R05 Cough: Secondary | ICD-10-CM

## 2013-02-09 DIAGNOSIS — J209 Acute bronchitis, unspecified: Secondary | ICD-10-CM

## 2013-02-09 DIAGNOSIS — M069 Rheumatoid arthritis, unspecified: Secondary | ICD-10-CM

## 2013-02-09 MED ORDER — AZITHROMYCIN 250 MG PO TABS: ORAL_TABLET | ORAL | Status: DC

## 2013-02-09 NOTE — Progress Notes (Signed)
Subjective: Patient presents today for followup of ulceration submetatarsal 4 of the left foot. She also has a slight sore on the right first metatarsal head as well. She has severe rheumatoid arthritis and rheumatoid arthritic changes bilateral feet and will last seen 2 weeks ago by Dr. Milinda Pointer for a abscess and cellulitis of this left foot. She's here today for a checkup.  Objective: Ulceration has completely healed submetatarsal 3/4 of the left foot. There is an area of dried callus tissue present and no underlying ulcer is present. Intact integument is seen. No redness, no swelling, no streaking, no signs of infection are present. She does have dry hemorrhagic and callused tissue on the first metatarsal head right which is lightly debrided without complication. Overall it appears she's greatly improved since her last visit.  Assessment: Status post incision and drainage cellulitis/ulcer left foot  Plan: A light debridement of necrotic tissue was performed without complication. No area of ulceration or abscess identified. Patient instructed to continue applying soft protective dressings on the bony prominence of bilateral feet. Otherwise she can discontinue foot soaks and ulcer care. She will be seen back for routine care visit in 4-6 weeks or as requested.

## 2013-02-09 NOTE — Progress Notes (Signed)
[facility] [address], [place] [address] [phone]  Date:  02/09/2013   Name:  [name]   DOB:  07/15/1925   MRN:  [id number]  PCP:  Andreas Blower, MD    Chief Complaint: Cough   History of Present Illness:  [name] is a 80 y.o. very pleasant female patient who presents with the following:  Ill with a non productive cough over past week.  Worsened over past few days.  No wheezing.  Has exertional (stable) shortness of breath related to failure.  No fever or chills.  Has sore throat and nasal congestion that have have has persisted.  No improvement with over the counter medications or other home remedies.  Says her ears hurt and she has a headache.  Denies other complaint or health concern today..    Patient Active Problem List   Diagnosis Date Noted  . Anemia, unspecified 07/04/2012  . Edema   . Cancer   . Anal squamous cell carcinoma 04/13/2012  . Bright red blood per rectum 04/06/2012  . Aortic stenosis   . Hypokalemia 02/05/2012  . Normocytic anemia 02/05/2012  . Bacteremia due to Escherichia coli 02/05/2012  . Lymphadenopathy, abdominal 02/03/2012  . Cholelithiasis 02/03/2012  . Acute cholecystitis 02/03/2012  . Hiatal hernia 02/02/2012  . Mitral regurgitation   . Carotid artery disease   . Drug therapy   . Clot   . Ejection fraction   . Lung disease, interstitial   . CHF (congestive heart failure)   . Dyslipidemia   . CAD (coronary artery disease)   . Rheumatoid arthritis on chronic steroids   . HYPERTENSION 10/26/2008    Past Medical History  Diagnosis Date  . CAD (coronary artery disease)     2 vessel intervention 2003  . Dyslipidemia   . CHF (congestive heart failure)     single episode  . Mitral regurgitation     mild prolapse anterior & posterior leaflets  . Aortic valve sclerosis     mild moderate calcification, echo, 2009  . HTN (hypertension)     difficult to obtain BP at times.  [name] Kitchen Urethral  trauma     bleeding in hospital  . Rheumatoid arthritis(714.0)     severe - deforming  . Lung disease, interstitial     related to rheumatoid arthritis, also question  of left apical nodule...followed elsewhere..my understanding stabilized  . Carotid artery disease     doppler 12/25/2010,  0-39% bilateral  . Ejection fraction     EF 60%, echo, 11/2007  . Clot     ??? apical clot in the past ?? no longer an issue  . Drug therapy     Prednisone.  . Depression   . GERD (gastroesophageal reflux disease)   . Cataract   . Aortic stenosis     Moderately severe, echo, December, 2013  . Rectal bleed 04/06/2012  . Anemia   . 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 Cancer     rectal  . Edema     May, 2014  . History of radiation therapy 05/11/12-05/19/12    pelvis/inguinal lymphnodes/only able to complete 6 fx    Past Surgical History  Procedure Laterality Date  . Appendectomy    . Breast surgery    . Coronary angioplasty with stent placement  2003  . Colonoscopy N/A 04/08/2012    Procedure: COLONOSCOPY;  Surgeon: Winfield Cunas., MD;  Location: [facility] ENDOSCOPY;  Service: Endoscopy;  Laterality: N/A;  . Lymph node biopsy  04/12/12    right inguinal lymph node-squamous cell carcinoma    History  Substance Use Topics  . Smoking status: Never Smoker   . Smokeless tobacco: Never Used  . Alcohol Use: No    Family History  Problem Relation Age of Onset  . Heart disease Mother   . Heart disease Father     Allergies  Allergen Reactions  . Ativan [Lorazepam]     Confused and out of it    Medication list has been reviewed and updated.  Current Outpatient Prescriptions on File Prior to Visit  Medication Sig Dispense Refill  . aspirin-acetaminophen-caffeine (EXCEDRIN EXTRA STRENGTH) 250-250-65 MG per tablet Take 2 tablets by mouth every 6 (six) hours as needed for pain.       . Calcium Carbonate-Vitamin D (CALCIUM 600+D) 600-400 MG-UNIT per tablet Take 1  tablet by mouth daily.        [name] Kitchen docusate sodium (COLACE) 100 MG capsule Take 100 mg by mouth 2 (two) times daily as needed for constipation.      . Ensure Plus (ENSURE PLUS) LIQD Take 237 mLs by mouth 2 (two) times daily as needed (for suboptimal po intake.).      [name] Kitchen esomeprazole (NEXIUM) 40 MG capsule Take 1 capsule (40 mg total) by mouth daily before breakfast.  30 capsule  2  . leflunomide (ARAVA) 20 MG tablet Take 20 mg by mouth daily.       [name] Kitchen loperamide (IMODIUM) 2 MG capsule Take 2 mg by mouth 4 (four) times daily as needed for diarrhea or loose stools.      . metoprolol succinate (TOPROL-XL) 100 MG 24 hr tablet Take 1 tablet (100 mg total) by mouth every evening. Take with or immediately following a meal.  30 tablet  9  . Multiple Vitamins-Minerals (PRESERVISION AREDS PO) Take 1 capsule by mouth daily.      . mupirocin ointment (BACTROBAN) 2 % Place 1 application into the nose 2 (two) times daily. Apply to area after soaking twice daily then dress.  22 g  3  . potassium chloride SA (K-DUR,KLOR-CON) 20 MEQ tablet Take one tablet daily for 3 days then one tablet every other day.  30 tablet  3  . predniSONE (DELTASONE) 5 MG tablet Take 5 mg by mouth daily.        [name] Kitchen acetaminophen (TYLENOL) 500 MG tablet Take 1,000 mg by mouth every 6 (six) hours as needed for pain.      [name] Kitchen amitriptyline (ELAVIL) 50 MG tablet Take 1 tablet (50 mg total) by mouth at bedtime.  30 tablet  0  . atorvastatin (LIPITOR) 20 MG tablet Take 1 tablet (20 mg total) by mouth every evening.  30 tablet  1  . ondansetron (ZOFRAN ODT) 8 MG disintegrating tablet 8mg  ODT q4 hours prn nausea  4 tablet  0   No current facility-administered medications on file prior to visit.    Review of Systems:  As per HPI, otherwise negative.    Physical Examination: Filed Vitals:   02/09/13 1216  Pulse: 96  Temp: 99 F (37.2 C)  Resp: 20   Filed Vitals:   02/09/13 1216  Height: 5' 1.5" (1.562 m)  Weight: 104 lb (47.174 kg)   Body  mass index is 19.33 kg/(m^2). Ideal Body Weight: Weight in (lb) to have BMI = 25: 134.2  GEN: WDWN, NAD, Non-toxic, A & O x 3  HEENT: Atraumatic, Normocephalic. Neck supple. No masses, No LAD. Ears and Nose: No external deformity. CV: RRR, No M/G/R. No JVD. No thrill. No extra heart sounds. PULM: CTA B, no wheezes, crackles, rhonchi. No retractions. No resp. distress. No accessory muscle use. ABD: S, NT, ND, +BS. No rebound. No HSM. EXTR: No c/c/e NEURO Normal gait.  PSYCH: Normally interactive. Conversant. Not depressed or anxious appearing.  Calm demeanor.    Assessment and Plan: diaghragmatic hernia Hiatal hernia Bronchitis zpack  Signed,  Ellison Carwin, MD   [facility] reading (PRIMARY) by  Dr. Ouida Sills. diaghragmatic hernia.  No acute disease.

## 2013-02-09 NOTE — Patient Instructions (Signed)

## 2013-02-09 NOTE — Progress Notes (Deleted)
   Subjective:    Patient ID: [name], female    DOB: 1925-05-13, 80 y.o.   MRN: [id number]  HPI Comments: '' BOTH FEET STILL LITTLE SORE.''     Review of Systems     Objective:   Physical Exam        Assessment & Plan:

## 2013-02-15 ENCOUNTER — Ambulatory Visit: Payer: Medicare Other | Admitting: Radiation Oncology

## 2013-03-02 ENCOUNTER — Encounter: Payer: Self-pay | Admitting: Radiation Oncology

## 2013-03-08 ENCOUNTER — Encounter: Payer: Self-pay | Admitting: Radiation Oncology

## 2013-03-08 ENCOUNTER — Ambulatory Visit
Admission: RE | Admit: 2013-03-08 | Discharge: 2013-03-08 | Disposition: A | Discharge status: Home / Self Care | Payer: Medicare Other | Source: Ambulatory Visit | Attending: Radiation Oncology | Admitting: Radiation Oncology

## 2013-03-08 VITALS — BP 152/78 | HR 113 | Temp 98.6°F | Ht 61.5 in | Wt 98.1 lb

## 2013-03-08 DIAGNOSIS — C210 Malignant neoplasm of anus, unspecified: Secondary | ICD-10-CM

## 2013-03-08 HISTORY — DX: Personal history of irradiation: Z92.3

## 2013-03-08 NOTE — Progress Notes (Signed)
Culture and sensitivity of right forefoot for abscess, no current antibiotics being taken by pt.

## 2013-03-08 NOTE — Progress Notes (Signed)
Department of Radiation Oncology  Phone:  [phone] Fax:        [phone]   Name: [name] MRN: [id number]  DOB: [birth year]  Date: 03/08/2013  Follow Up Visit Note  Diagnosis: Locally advanced anal cancer status post palliative radiation to a total dose of 15 gray in 6 fractions at 2.5 gray per fraction completed 05/19/2012 then further treatment to a total dose of 12.5 Gy in 5 fractions to the anus completed 12/21/12  Interval History: [name] presents today for routine followup.  She unfortunately has had a cold and been on some antibiotics a she's had some diarrhea from that. She denies any bleeding. She has normal bowel movements that are not painful. She has lost a little weight because of her diarrhea. She has noticed an increase in size in the right inguinal node. It is painful it occasionally when she moves or bends in that direction.   Allergies  Allergen Reactions  . Ativan [Lorazepam]     Confused and out of it    Medications:  Current Outpatient Prescriptions  Medication Sig Dispense Refill  . acetaminophen (TYLENOL) 500 MG tablet Take 1,000 mg by mouth every 6 (six) hours as needed for pain.      [name] Kitchen aspirin-acetaminophen-caffeine (EXCEDRIN EXTRA STRENGTH) 250-250-65 MG per tablet Take 2 tablets by mouth every 6 (six) hours as needed for pain.       [name] Kitchen atorvastatin (LIPITOR) 20 MG tablet Take 1 tablet (20 mg total) by mouth every evening.  30 tablet  1  . azithromycin (ZITHROMAX) 250 MG tablet Take 2 tabs PO x 1 dose, then 1 tab PO QD x 4 days  6 tablet  0  . docusate sodium (COLACE) 100 MG capsule Take 100 mg by mouth 2 (two) times daily as needed for constipation.      . Ensure Plus (ENSURE PLUS) LIQD Take 237 mLs by mouth 2 (two) times daily as needed (for suboptimal po intake.).      [name] Kitchen esomeprazole (NEXIUM) 40 MG capsule Take 1 capsule (40 mg total) by mouth daily before breakfast.  30 capsule  2  . leflunomide (ARAVA) 20 MG tablet Take 20 mg by mouth  daily.       [name] Kitchen loperamide (IMODIUM) 2 MG capsule Take 2 mg by mouth 4 (four) times daily as needed for diarrhea or loose stools.      . metoprolol succinate (TOPROL-XL) 100 MG 24 hr tablet Take 1 tablet (100 mg total) by mouth every evening. Take with or immediately following a meal.  30 tablet  9  . Multiple Vitamins-Minerals (PRESERVISION AREDS PO) Take 1 capsule by mouth daily.      . ondansetron (ZOFRAN ODT) 8 MG disintegrating tablet 8mg  ODT q4 hours prn nausea  4 tablet  0  . predniSONE (DELTASONE) 5 MG tablet Take 5 mg by mouth daily.        [name] Kitchen amitriptyline (ELAVIL) 50 MG tablet Take 1 tablet (50 mg total) by mouth at bedtime.  30 tablet  0  . mupirocin ointment (BACTROBAN) 2 % Place 1 application into the nose 2 (two) times daily. Apply to area after soaking twice daily then dress.  22 g  3  . potassium chloride SA (K-DUR,KLOR-CON) 20 MEQ tablet Take one tablet daily for 3 days then one tablet every other day.  30 tablet  3   No current facility-administered medications for this encounter.    Physical Exam:  Filed Vitals:   03/08/13 6578  BP: 152/78  Pulse: 113  Temp: 98.6 F (37 C)   she is a pleasant elderly female in no distress sitting comfortably on examining table. Her weight is up down to 98 pounds. She has a fixed hard but palpable node in the right inguinal region. Examination of her anus reveals to some external hemorrhoids. There is a hint of a tumor inside the anal verge. No bleeding. She has skin discoloration over her anterior pelvis and her buttocks bilaterally.  IMPRESSION: [name] is a 80 y.o. female with a locally advanced anal cancer status post palliative radiation with controlled bleeding and minimal pain  PLAN:  She is really doing well. I would be concerned about restarting radiation given that she is kind of down right now. I think we hold off on treating this right inguinal area until it causes her constant or continuous pain or swelling of her extremity. I  don't think we can make her feel much better and I'm certainly weeks or we could make her feel worse. I put off any treatment for now. I'll see her back in 3 months. Of course if she develops further pain swelling or bleeding in between that time we can certainly move forward with treatment earlier. She was in agreement with this plan as were her son and daughter.   Thea Silversmith, MD

## 2013-03-08 NOTE — Progress Notes (Addendum)
[name] here for assessement s/p radiation therapy for her diagnosed Rectal Cancer.  She states that she has a new, no-painful "knot" in her right groin which has been present for the "past couple of weeks." Note edema of her right ankle.  She states she has been on antibiotics following surgery on her feet, prior to Christmas ,and had diarrhea and bronchitis.   Her last weight on 02/09/13 was 104 as compared to today's weight of 98.1 lbs.  She admits to decrease in her appetite.   History of CHF

## 2013-03-22 ENCOUNTER — Ambulatory Visit (INDEPENDENT_AMBULATORY_CARE_PROVIDER_SITE_OTHER): Payer: Medicare Other | Admitting: Podiatry

## 2013-03-22 ENCOUNTER — Encounter: Payer: Self-pay | Admitting: Podiatry

## 2013-03-22 VITALS — BP 121/72 | HR 91 | Resp 20

## 2013-03-22 DIAGNOSIS — L97509 Non-pressure chronic ulcer of other part of unspecified foot with unspecified severity: Secondary | ICD-10-CM

## 2013-03-22 DIAGNOSIS — M79609 Pain in unspecified limb: Secondary | ICD-10-CM

## 2013-03-22 DIAGNOSIS — B351 Tinea unguium: Secondary | ICD-10-CM

## 2013-03-22 NOTE — Progress Notes (Signed)
Left foot ulcer 4th met , pt states that it is kind of sore.  Objective: Vital signs are stable she is alert and oriented x3. Pulses are palpable bilateral. Severe hallux abductovalgus deformities noted bilateral. Superficial ulceration sub-fourth metatarsal head of the left foot was debrided does demonstrate ulceration the does not probe deep to blunt bone I debrided the ulceration today. Nails are thick yellow dystrophic with mycotic and painful palpation.  Assessment: Superficial ulceration sub-fourth metatarsal of the left foot. Pain in limb secondary to onychomycosis.  Plan: Discussed etiology pathology conservative surgical therapies this point time debrided reactive hyperkeratotic lesion plantarly and also debridement nails 1 through 5 bilateral is cover service secondary to pain. She will continue conservative therapies and dressing of this wound will followup with [place] in the next few weeks.

## 2013-03-23 ENCOUNTER — Telehealth: Payer: Self-pay | Admitting: Internal Medicine

## 2013-03-23 NOTE — Telephone Encounter (Signed)
Talked to pt and gave her appt for 4/7 lab and MD r/s from 3/30 due to MD PAL

## 2013-03-27 ENCOUNTER — Other Ambulatory Visit: Payer: Self-pay

## 2013-03-27 MED ORDER — METOPROLOL SUCCINATE ER 100 MG PO TB24: 100.0000 mg | ORAL_TABLET | Freq: Every evening | ORAL | Status: DC

## 2013-04-19 ENCOUNTER — Ambulatory Visit (INDEPENDENT_AMBULATORY_CARE_PROVIDER_SITE_OTHER): Payer: Medicare Other | Admitting: Podiatry

## 2013-04-19 ENCOUNTER — Encounter: Payer: Self-pay | Admitting: Podiatry

## 2013-04-19 VITALS — BP 121/72 | HR 91 | Resp 16

## 2013-04-19 DIAGNOSIS — L97509 Non-pressure chronic ulcer of other part of unspecified foot with unspecified severity: Secondary | ICD-10-CM

## 2013-04-19 DIAGNOSIS — M069 Rheumatoid arthritis, unspecified: Secondary | ICD-10-CM

## 2013-04-20 NOTE — Progress Notes (Signed)
She presents today for followup of ulceration sub-first metatarsophalangeal joint plantar medial aspect and sub-fourth metatarsal of the left foot. She states that they're still quite sore.  Objective: Vital signs are stable she is alert and oriented x3. She has severe rheumatoid arthritis resulting in these anatomical deformity is resulting in superficial ulcerations. These are non-complicated appear to be healing over quite nicely the pain is primarily from the area of reactive hyperkeratosis and the lack of padding.  Assessment: Rheumatoid arthritis with resultant reactive hyperkeratosis and ulcerations plantar aspect of the bilateral foot.  Plan: Continue all conservative therapies and will continue to debridement his on regular basis for her. Watch for signs and symptoms of infection and she is to notify me should she see any.

## 2013-04-25 ENCOUNTER — Telehealth: Payer: Self-pay

## 2013-04-25 NOTE — Telephone Encounter (Signed)
Patient called to state she has continued intermittent squirts  of diarrhea.Flare up on Saturday and Monday.Has imodium ad which I informed her to continue.Denies abdominal pain.Told patient to maintain low fiber diet as she did have a large salad last week.Told to try to limit foods with skin such as apples and potatos.Patient to call on Friday to let me know how she is doing.Has scheduled appointment next Thursday for follow up with Dr.Wentworth.

## 2013-04-29 ENCOUNTER — Observation Stay (HOSPITAL_COMMUNITY): Payer: Medicare Other

## 2013-04-29 ENCOUNTER — Encounter (HOSPITAL_COMMUNITY): Payer: Self-pay | Admitting: Emergency Medicine

## 2013-04-29 ENCOUNTER — Inpatient Hospital Stay (HOSPITAL_COMMUNITY)
Admission: EM | Admit: 2013-04-29 | Discharge: 2013-05-03 | DRG: 385 | Disposition: A | Discharge status: Home / Self Care | Payer: Medicare Other | Attending: Internal Medicine | Admitting: Internal Medicine

## 2013-04-29 DIAGNOSIS — M069 Rheumatoid arthritis, unspecified: Secondary | ICD-10-CM | POA: Diagnosis present

## 2013-04-29 DIAGNOSIS — K5289 Other specified noninfective gastroenteritis and colitis: Secondary | ICD-10-CM

## 2013-04-29 DIAGNOSIS — F3289 Other specified depressive episodes: Secondary | ICD-10-CM | POA: Diagnosis present

## 2013-04-29 DIAGNOSIS — I739 Peripheral vascular disease, unspecified: Secondary | ICD-10-CM

## 2013-04-29 DIAGNOSIS — IMO0002 Reserved for concepts with insufficient information to code with codable children: Secondary | ICD-10-CM

## 2013-04-29 DIAGNOSIS — I10 Essential (primary) hypertension: Secondary | ICD-10-CM | POA: Diagnosis present

## 2013-04-29 DIAGNOSIS — I2510 Atherosclerotic heart disease of native coronary artery without angina pectoris: Secondary | ICD-10-CM

## 2013-04-29 DIAGNOSIS — Z8249 Family history of ischemic heart disease and other diseases of the circulatory system: Secondary | ICD-10-CM

## 2013-04-29 DIAGNOSIS — I359 Nonrheumatic aortic valve disorder, unspecified: Secondary | ICD-10-CM | POA: Diagnosis present

## 2013-04-29 DIAGNOSIS — K5100 Ulcerative (chronic) pancolitis without complications: Principal | ICD-10-CM | POA: Diagnosis present

## 2013-04-29 DIAGNOSIS — E785 Hyperlipidemia, unspecified: Secondary | ICD-10-CM | POA: Diagnosis present

## 2013-04-29 DIAGNOSIS — I340 Nonrheumatic mitral (valve) insufficiency: Secondary | ICD-10-CM | POA: Diagnosis present

## 2013-04-29 DIAGNOSIS — E876 Hypokalemia: Secondary | ICD-10-CM | POA: Diagnosis present

## 2013-04-29 DIAGNOSIS — E860 Dehydration: Secondary | ICD-10-CM

## 2013-04-29 DIAGNOSIS — K219 Gastro-esophageal reflux disease without esophagitis: Secondary | ICD-10-CM | POA: Diagnosis present

## 2013-04-29 DIAGNOSIS — I779 Disorder of arteries and arterioles, unspecified: Secondary | ICD-10-CM | POA: Diagnosis present

## 2013-04-29 DIAGNOSIS — C210 Malignant neoplasm of anus, unspecified: Secondary | ICD-10-CM

## 2013-04-29 DIAGNOSIS — D649 Anemia, unspecified: Secondary | ICD-10-CM

## 2013-04-29 DIAGNOSIS — E43 Unspecified severe protein-calorie malnutrition: Secondary | ICD-10-CM | POA: Diagnosis present

## 2013-04-29 DIAGNOSIS — F329 Major depressive disorder, single episode, unspecified: Secondary | ICD-10-CM | POA: Diagnosis present

## 2013-04-29 DIAGNOSIS — R197 Diarrhea, unspecified: Secondary | ICD-10-CM

## 2013-04-29 DIAGNOSIS — Z79899 Other long term (current) drug therapy: Secondary | ICD-10-CM

## 2013-04-29 DIAGNOSIS — Z681 Body mass index (BMI) 19 or less, adult: Secondary | ICD-10-CM

## 2013-04-29 DIAGNOSIS — C801 Malignant (primary) neoplasm, unspecified: Secondary | ICD-10-CM

## 2013-04-29 DIAGNOSIS — I059 Rheumatic mitral valve disease, unspecified: Secondary | ICD-10-CM | POA: Diagnosis present

## 2013-04-29 DIAGNOSIS — Z85048 Personal history of other malignant neoplasm of rectum, rectosigmoid junction, and anus: Secondary | ICD-10-CM

## 2013-04-29 DIAGNOSIS — Z923 Personal history of irradiation: Secondary | ICD-10-CM

## 2013-04-29 DIAGNOSIS — I509 Heart failure, unspecified: Secondary | ICD-10-CM

## 2013-04-29 HISTORY — DX: Unspecified macular degeneration: H35.30

## 2013-04-29 LAB — COMPREHENSIVE METABOLIC PANEL
ALBUMIN: 3.4 g/dL — AB (ref 3.5–5.2)
ALT: 8 U/L (ref 0–35)
AST: 33 U/L (ref 0–37)
Alkaline Phosphatase: 160 U/L — ABNORMAL HIGH (ref 39–117)
BUN: 9 mg/dL (ref 6–23)
CO2: 26 mEq/L (ref 19–32)
Calcium: 10.2 mg/dL (ref 8.4–10.5)
Chloride: 98 mEq/L (ref 96–112)
Creatinine, Ser: 0.47 mg/dL — ABNORMAL LOW (ref 0.50–1.10)
GFR calc Af Amer: >90 mL/min (ref >90)
GFR calc non Af Amer: 88 mL/min — ABNORMAL LOW (ref >90)
Glucose, Bld: 93 mg/dL (ref 70–99)
POTASSIUM: 4.3 meq/L (ref 3.7–5.3)
Sodium: 140 mEq/L (ref 137–147)
TOTAL PROTEIN: 7.2 g/dL (ref 6.0–8.3)
Total Bilirubin: 0.4 mg/dL (ref 0.3–1.2)

## 2013-04-29 LAB — URINALYSIS, ROUTINE W REFLEX MICROSCOPIC
Bilirubin Urine: NEGATIVE
GLUCOSE, UA: NEGATIVE mg/dL
Hgb urine dipstick: NEGATIVE
Ketones, ur: NEGATIVE mg/dL
LEUKOCYTES UA: NEGATIVE
Nitrite: NEGATIVE
PH: 5 (ref 5.0–8.0)
Protein, ur: NEGATIVE mg/dL
Specific Gravity, Urine: 1.027 (ref 1.005–1.030)
Urobilinogen, UA: 0.2 mg/dL (ref 0.0–1.0)

## 2013-04-29 LAB — CBC WITH DIFFERENTIAL/PLATELET
BASOS PCT: 0 % (ref 0–1)
Basophils Absolute: 0 10*3/uL (ref 0.0–0.1)
EOS ABS: 0.1 10*3/uL (ref 0.0–0.7)
Eosinophils Relative: 1 % (ref 0–5)
HCT: 36 % (ref 36.0–46.0)
Hemoglobin: 11.6 g/dL — ABNORMAL LOW (ref 12.0–15.0)
Lymphocytes Relative: 14 % (ref 12–46)
Lymphs Abs: 0.9 10*3/uL (ref 0.7–4.0)
MCH: 31.4 pg (ref 26.0–34.0)
MCHC: 32.2 g/dL (ref 30.0–36.0)
MCV: 97.6 fL (ref 78.0–100.0)
Monocytes Absolute: 0.8 10*3/uL (ref 0.1–1.0)
Monocytes Relative: 13 % — ABNORMAL HIGH (ref 3–12)
NEUTROS PCT: 72 % (ref 43–77)
Neutro Abs: 4.4 10*3/uL (ref 1.7–7.7)
PLATELETS: 239 10*3/uL (ref 150–400)
RBC: 3.69 MIL/uL — ABNORMAL LOW (ref 3.87–5.11)
RDW: 15.1 % (ref 11.5–15.5)
WBC: 6.2 10*3/uL (ref 4.0–10.5)

## 2013-04-29 LAB — CLOSTRIDIUM DIFFICILE BY PCR: CDIFFPCR: NEGATIVE

## 2013-04-29 MED ORDER — ACETAMINOPHEN 325 MG PO TABS
650.0000 mg | ORAL_TABLET | Freq: Once | ORAL | Status: AC
  Administered 2013-04-29: 650 mg via ORAL
  Filled 2013-04-29: qty 2

## 2013-04-29 MED ORDER — ACETAMINOPHEN 650 MG RE SUPP: 650.0000 mg | Freq: Four times a day (QID) | RECTAL | Status: DC | PRN

## 2013-04-29 MED ORDER — ACETAMINOPHEN 325 MG PO TABS
650.0000 mg | ORAL_TABLET | Freq: Four times a day (QID) | ORAL | Status: DC | PRN
  Administered 2013-04-30 – 2013-05-03 (×7): 650 mg via ORAL
  Filled 2013-04-29 (×7): qty 2

## 2013-04-29 MED ORDER — METOPROLOL SUCCINATE ER 100 MG PO TB24
100.0000 mg | ORAL_TABLET | Freq: Every evening | ORAL | Status: DC
  Administered 2013-04-29 – 2013-05-02 (×4): 100 mg via ORAL
  Filled 2013-04-29 (×6): qty 1

## 2013-04-29 MED ORDER — HEPARIN SODIUM (PORCINE) 5000 UNIT/ML IJ SOLN
5000.0000 [IU] | Freq: Three times a day (TID) | INTRAMUSCULAR | Status: DC
  Administered 2013-04-29 – 2013-05-03 (×11): 5000 [IU] via SUBCUTANEOUS
  Filled 2013-04-29 (×16): qty 1

## 2013-04-29 MED ORDER — SODIUM CHLORIDE 0.9 % IV SOLN
INTRAVENOUS | Status: DC
  Administered 2013-04-29: 18:00:00 via INTRAVENOUS
  Administered 2013-04-30: 100 mL via INTRAVENOUS
  Administered 2013-04-30 – 2013-05-01 (×3): via INTRAVENOUS
  Administered 2013-05-02: 100 mL via INTRAVENOUS

## 2013-04-29 MED ORDER — MORPHINE SULFATE 2 MG/ML IJ SOLN: 2.0000 mg | INTRAMUSCULAR | Status: DC | PRN

## 2013-04-29 MED ORDER — PREDNISONE 5 MG PO TABS
5.0000 mg | ORAL_TABLET | Freq: Every day | ORAL | Status: DC
  Administered 2013-04-30 – 2013-05-03 (×4): 5 mg via ORAL
  Filled 2013-04-29 (×5): qty 1

## 2013-04-29 MED ORDER — AMITRIPTYLINE HCL 50 MG PO TABS
50.0000 mg | ORAL_TABLET | Freq: Every day | ORAL | Status: DC
  Administered 2013-04-29 – 2013-05-02 (×4): 50 mg via ORAL
  Filled 2013-04-29 (×6): qty 1

## 2013-04-29 MED ORDER — IOHEXOL 300 MG/ML  SOLN
100.0000 mL | Freq: Once | INTRAMUSCULAR | Status: AC | PRN
  Administered 2013-04-29: 50 mL via ORAL

## 2013-04-29 MED ORDER — SODIUM CHLORIDE 0.9 % IV SOLN: INTRAVENOUS | Status: DC

## 2013-04-29 MED ORDER — POLYVINYL ALCOHOL 1.4 % OP SOLN
1.0000 [drp] | Freq: Three times a day (TID) | OPHTHALMIC | Status: DC | PRN
  Administered 2013-04-29: 1 [drp] via OPHTHALMIC
  Filled 2013-04-29: qty 15

## 2013-04-29 MED ORDER — LOPERAMIDE HCL 2 MG PO CAPS: 2.0000 mg | ORAL_CAPSULE | Freq: Four times a day (QID) | ORAL | Status: DC | PRN

## 2013-04-29 MED ORDER — SODIUM CHLORIDE 0.9 % IV SOLN
INTRAVENOUS | Status: DC
  Administered 2013-04-29: 12:00:00 via INTRAVENOUS

## 2013-04-29 MED ORDER — IOHEXOL 300 MG/ML  SOLN
100.0000 mL | Freq: Once | INTRAMUSCULAR | Status: AC | PRN
  Administered 2013-04-29: 100 mL via INTRAVENOUS

## 2013-04-29 MED ORDER — LEFLUNOMIDE 20 MG PO TABS
20.0000 mg | ORAL_TABLET | Freq: Every day | ORAL | Status: DC
  Administered 2013-04-30 – 2013-05-03 (×4): 20 mg via ORAL
  Filled 2013-04-29 (×5): qty 1

## 2013-04-29 MED ORDER — ONDANSETRON HCL 4 MG/2ML IJ SOLN
4.0000 mg | Freq: Four times a day (QID) | INTRAMUSCULAR | Status: DC | PRN
  Filled 2013-04-29: qty 2

## 2013-04-29 MED ORDER — PANTOPRAZOLE SODIUM 40 MG PO TBEC
40.0000 mg | DELAYED_RELEASE_TABLET | Freq: Every day | ORAL | Status: DC
  Administered 2013-04-29 – 2013-05-03 (×5): 40 mg via ORAL
  Filled 2013-04-29 (×5): qty 1

## 2013-04-29 NOTE — ED Provider Notes (Signed)
Medical screening examination/treatment/procedure(s) were conducted as a shared visit with non-physician practitioner(s) and myself.  I personally evaluated the patient during the encounter.   EKG Interpretation None      80 yo female with hx of cancer presenting with persistent diarrhea, decreased PO intake, and weight loss.  On exam, nontoxic, but cachectic and frail appearing, normal respiratory effort, abd soft and nontender.  Plan admit for diarrhea, failure to thrive, cancer.  Clinical Impression: 1. Diarrhea   2. Failure to thrive   3. Anal squamous cell carcinoma   4. CAD (coronary artery disease)   5. Cancer   6. CHF (congestive heart failure)   7. Dehydration       Houston Siren III, MD 04/29/13 [phone]

## 2013-04-29 NOTE — ED Notes (Signed)
Pt made aware of need for stool and urine specimen

## 2013-04-29 NOTE — H&P (Signed)
[facility] History and Physical  [name] GLO:[id number] DOB: 1925-06-20 DOA: 04/29/2013  Referring physician: Emergency Department PCP: Tula Nakayama, MD  Specialists:   Chief Complaint: Diarrhea  HPI: [name] is a 80 y.o. female  With a hx of CAD, AS, HTN, rectal cancer, and CHF who presents to the ED with several weeks of diarrhea with decreased appetite. In the Ed, the patient was noted to have a normal renal function. Hospitalist consulted for concerns of dehydration and to address palliation. On further questioning, pt denies abd pain. No sick contacts. Reports early satiety.   Review of Systems:   Per above, the remainder of the 10pt ros reviewed and are neg  Past Medical History  Diagnosis Date  . CAD (coronary artery disease)     2 vessel intervention 2003  . Dyslipidemia   . CHF (congestive heart failure)     single episode  . Mitral regurgitation     mild prolapse anterior & posterior leaflets  . Aortic valve sclerosis     mild moderate calcification, echo, 2009  . HTN (hypertension)     difficult to obtain BP at times.  [name] Kitchen Urethral trauma     bleeding in hospital  . Rheumatoid arthritis(714.0)     severe - deforming  . Lung disease, interstitial     related to rheumatoid arthritis, also question  of left apical nodule...followed elsewhere..my understanding stabilized  . Carotid artery disease     doppler 12/25/2010,  0-39% bilateral  . Ejection fraction     EF 60%, echo, 11/2007  . Clot     ??? apical clot in the past ?? no longer an issue  . Drug therapy     Prednisone.  . Depression   . GERD (gastroesophageal reflux disease)   . Cataract   . Aortic stenosis     Moderately severe, echo, December, 2013  . Rectal bleed 04/06/2012  . Anemia   . 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 Cancer     rectal  . Edema     May, 2014  . History of radiation therapy 05/11/12-05/19/12    pelvis/inguinal  lymphnodes/only able to complete 6 fx  . S/P radiation therapy 12/11/2012-12/21/2012    Rectum and anus / 12.5 Gy at 2.5 Gy per fraction x 5 fractions delivered every other day  . Degeneration macular    Past Surgical History  Procedure Laterality Date  . Appendectomy    . Breast surgery    . Coronary angioplasty with stent placement  2003  . Colonoscopy N/A 04/08/2012    Procedure: COLONOSCOPY;  Surgeon: Vertell Novak., MD;  Location: [facility] ENDOSCOPY;  Service: Endoscopy;  Laterality: N/A;  . Lymph node biopsy  04/12/12    right inguinal lymph node-squamous cell carcinoma  . Tonsillectomy     Social History:  reports that she has never smoked. She has never used smokeless tobacco. She reports that she does not drink alcohol or use illicit drugs.  where does patient live--home, ALF, SNF? and with whom if at home?  Can patient participate in ADLs?  Allergies  Allergen Reactions  . Ativan [Lorazepam]     Confused and out of it    Family History  Problem Relation Age of Onset  . Heart disease Mother   . Heart disease Father     (be sure to complete)  Prior to Admission medications   Medication Sig Start Date End Date  Taking? Authorizing Provider  acetaminophen (TYLENOL) 500 MG tablet Take 1,000 mg by mouth every 6 (six) hours as needed for pain.   Yes Historical Provider, MD  amitriptyline (ELAVIL) 50 MG tablet Take 50 mg by mouth at bedtime.   Yes Historical Provider, MD  aspirin-acetaminophen-caffeine (EXCEDRIN EXTRA STRENGTH) [phone] MG per tablet Take 2 tablets by mouth every 6 (six) hours as needed for pain.    Yes Historical Provider, MD  Ensure Plus (ENSURE PLUS) LIQD Take 237 mLs by mouth 2 (two) times daily as needed (for suboptimal po intake.).   Yes Historical Provider, MD  esomeprazole (NEXIUM) 40 MG capsule Take 1 capsule (40 mg total) by mouth daily before breakfast. 06/16/12  Yes Luis Abed, MD  hydroxypropyl methylcellulose (ISOPTO TEARS) 2.5 % ophthalmic  solution Place 1 drop into both eyes 3 (three) times daily as needed for dry eyes.   Yes Historical Provider, MD  leflunomide (ARAVA) 20 MG tablet Take 20 mg by mouth daily.    Yes Historical Provider, MD  loperamide (IMODIUM) 2 MG capsule Take 2 mg by mouth 4 (four) times daily as needed for diarrhea or loose stools.   Yes Historical Provider, MD  metoprolol succinate (TOPROL-XL) 100 MG 24 hr tablet Take 1 tablet (100 mg total) by mouth every evening. Take with or immediately following a meal. 03/27/13  Yes Luis Abed, MD  Multiple Vitamins-Minerals (PRESERVISION AREDS PO) Take 1 capsule by mouth daily.   Yes Historical Provider, MD  POTASSIUM CHLORIDE PO Take 1 tablet by mouth daily.   Yes Historical Provider, MD  predniSONE (DELTASONE) 5 MG tablet Take 5 mg by mouth daily.     Yes Historical Provider, MD   Physical Exam: Filed Vitals:   04/29/13 1034 04/29/13 1124 04/29/13 1158  BP: 142/54  139/82  Pulse: 85  91  Temp: 97.5 F (36.4 C)  98.4 F (36.9 C)  TempSrc: Oral  Oral  Resp: 16  14  Weight:  43.233 kg (95 lb 5 oz)   SpO2: 95%  94%     General:  Awake, in nad, appears cachectic   Eyes: PERRL B  ENT: Membranes dry, dentition fair  Neck: trachea midline, neck supple  Cardiovascular: regular, s1, s2  Respiratory: normal resp effort, no wheezing  Abdomen: soft, nondistended  Skin: perfused, no clubbing  Musculoskeletal: perfused, no clubbing  Psychiatric: mood/affect normal// no auditory/visual hallucinations  Neurologic: cn2-12 grossly intact, strength/sensation intact  Labs on Admission:  Basic Metabolic Panel:  Recent Labs Lab 04/29/13 1130  NA 140  K 4.3  CL 98  CO2 26  GLUCOSE 93  BUN 9  CREATININE 0.47*  CALCIUM 10.2   Liver Function Tests:  Recent Labs Lab 04/29/13 1130  AST 33  ALT 8  ALKPHOS 160*  BILITOT 0.4  PROT 7.2  ALBUMIN 3.4*   No results found for this basename: LIPASE, AMYLASE,  in the last 168 hours No results found  for this basename: AMMONIA,  in the last 168 hours CBC:  Recent Labs Lab 04/29/13 1130  WBC 6.2  NEUTROABS 4.4  HGB 11.6*  HCT 36.0  MCV 97.6  PLT 239   Cardiac Enzymes: No results found for this basename: CKTOTAL, CKMB, CKMBINDEX, TROPONINI,  in the last 168 hours  BNP (last 3 results) No results found for this basename: PROBNP,  in the last 8760 hours CBG: No results found for this basename: GLUCAP,  in the last 168 hours  Radiological Exams on Admission: No  results found.  Assessment/Plan Principal Problem:   Diarrhea Active Problems:   HYPERTENSION   Mitral regurgitation   Carotid artery disease   CHF (congestive heart failure)   CAD (coronary artery disease)   Anal squamous cell carcinoma  1. Diarrhea 1. Normal renal function 2. No sick contacts 3. Stool studies ordered through the Ed 4. Cont with IVF as tolerated 5. Given duration of sx (over several weeks), will obtain ct abd/pelvis 6. Admit to med-surg 2. HTN 1. BP stable 2. Cont meds 3. CAD 1. Stable 4. Anal Cancer 1. Pt is s/p radiation tx 2. May consider Palliative Care consult if no improvement of above 5. DVT prophylaxis 1. Heparin  Code Status: Full (must indicate code status--if unknown or must be presumed, indicate so) Family Communication: Pt in room (indicate person spoken with, if applicable, with phone number if by telephone) Disposition Plan: Pending  Time spent:  Theta Leaf, Scheryl Marten [facility] Pager [phone]  If 7PM-7AM, please contact night-coverage [email or web address] Password [facility] [place] 04/29/2013, 2:30 PM

## 2013-04-29 NOTE — ED Notes (Signed)
Pt states that she has been having diarrhea for a couple of weeks, nausea started yesterday, small amt of emesis.  Increased weakness due to diarrhea. Greater than 6 times per day, Imodium not helping.

## 2013-04-29 NOTE — ED Notes (Signed)
Pt reminded of need for urine and stool samples

## 2013-04-29 NOTE — ED Provider Notes (Signed)
CSN: [id number]     Arrival date & time 04/29/13  3536 History   First MD Initiated Contact with Patient 04/29/13 1054     Chief Complaint  Patient presents with  . Nausea  . Emesis  . Diarrhea     (Consider location/radiation/quality/duration/timing/severity/associated sxs/prior Treatment) HPI ONCOLOGIST: Dr. Juliann Mule RADIOLOGIST: DR. Pablo Ledger 04/14/2012 11:12 AM 04/15/2012 2:30 PM Full Code [id number] Marcelle Smiling, MD Locally advanced anal cancer status post palliative radiation 05/19/2012  Patient here bib son with complaints of diarrhea for the past 2 weeks of unknown cause. She reports no longer wanting to eat, not drinking and having only boughts of watery diarrhea throughout the day without abdominal pains or emesis. She has the rectal cancer and hasnt had radiation on this area in the past month. Unable to do another round due to being sick. She comes in because she now is feeling weak. Her and family members are interested in palliative care. She is still a full code. 3 lb weight loss in the past week 98 down to 95 lbs.    Past Medical History  Diagnosis Date  . CAD (coronary artery disease)     2 vessel intervention 2003  . Dyslipidemia   . CHF (congestive heart failure)     single episode  . Mitral regurgitation     mild prolapse anterior & posterior leaflets  . Aortic valve sclerosis     mild moderate calcification, echo, 2009  . HTN (hypertension)     difficult to obtain BP at times.  [name] Kitchen Urethral trauma     bleeding in hospital  . Rheumatoid arthritis(714.0)     severe - deforming  . Lung disease, interstitial     related to rheumatoid arthritis, also question  of left apical nodule...followed elsewhere..my understanding stabilized  . Carotid artery disease     doppler 12/25/2010,  0-39% bilateral  . Ejection fraction     EF 60%, echo, 11/2007  . Clot     ??? apical clot in the past ?? no longer an issue  . Drug therapy     Prednisone.  . Depression   . GERD  (gastroesophageal reflux disease)   . Cataract   . Aortic stenosis     Moderately severe, echo, December, 2013  . Rectal bleed 04/06/2012  . Anemia   . 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 Cancer     rectal  . Edema     May, 2014  . History of radiation therapy 05/11/12-05/19/12    pelvis/inguinal lymphnodes/only able to complete 6 fx  . S/P radiation therapy 12/11/2012-12/21/2012    Rectum and anus / 12.5 Gy at 2.5 Gy per fraction x 5 fractions delivered every other day  . Degeneration macular    Past Surgical History  Procedure Laterality Date  . Appendectomy    . Breast surgery    . Coronary angioplasty with stent placement  2003  . Colonoscopy N/A 04/08/2012    Procedure: COLONOSCOPY;  Surgeon: Winfield Cunas., MD;  Location: [facility] ENDOSCOPY;  Service: Endoscopy;  Laterality: N/A;  . Lymph node biopsy  04/12/12    right inguinal lymph node-squamous cell carcinoma  . Tonsillectomy     Family History  Problem Relation Age of Onset  . Heart disease Mother   . Heart disease Father    History  Substance Use Topics  . Smoking status: Never Smoker   . Smokeless tobacco: Never Used  .  Alcohol Use: No   OB History   Grav Para Term Preterm Abortions TAB SAB Ect Mult Living                 Review of Systems  The patient denies anorexia, fever, weight loss,, vision loss, decreased hearing, hoarseness, chest pain, syncope, dyspnea on exertion, peripheral edema, balance deficits, hemoptysis, abdominal pain, melena, hematochezia, severe indigestion/heartburn, hematuria, incontinence, genital sores,suspicious skin lesions, transient blindness, difficulty walking, depression, , abnormal bleeding, enlarged lymph nodes, angioedema, and breast masses.   Allergies  Ativan  Home Medications   Current Outpatient Rx  Name  Route  Sig  Dispense  Refill  . acetaminophen (TYLENOL) 500 MG tablet   Oral   Take 1,000 mg by mouth every 6 (six) hours as needed for  pain.         [name] Kitchen amitriptyline (ELAVIL) 50 MG tablet   Oral   Take 50 mg by mouth at bedtime.         [name] Kitchen aspirin-acetaminophen-caffeine (EXCEDRIN EXTRA STRENGTH) 250-250-65 MG per tablet   Oral   Take 2 tablets by mouth every 6 (six) hours as needed for pain.          . Ensure Plus (ENSURE PLUS) LIQD   Oral   Take 237 mLs by mouth 2 (two) times daily as needed (for suboptimal po intake.).         [name] Kitchen esomeprazole (NEXIUM) 40 MG capsule   Oral   Take 1 capsule (40 mg total) by mouth daily before breakfast.   30 capsule   2     Dispense as written.    Please keep upcoming appointment.   . hydroxypropyl methylcellulose (ISOPTO TEARS) 2.5 % ophthalmic solution   Both Eyes   Place 1 drop into both eyes 3 (three) times daily as needed for dry eyes.         [name] Kitchen leflunomide (ARAVA) 20 MG tablet   Oral   Take 20 mg by mouth daily.          [name] Kitchen loperamide (IMODIUM) 2 MG capsule   Oral   Take 2 mg by mouth 4 (four) times daily as needed for diarrhea or loose stools.         . metoprolol succinate (TOPROL-XL) 100 MG 24 hr tablet   Oral   Take 1 tablet (100 mg total) by mouth every evening. Take with or immediately following a meal.   30 tablet   3     .[name] KitchenPatient needs to contact office to schedule  App ...   . Multiple Vitamins-Minerals (PRESERVISION AREDS PO)   Oral   Take 1 capsule by mouth daily.         [name] Kitchen POTASSIUM CHLORIDE PO   Oral   Take 1 tablet by mouth daily.         . predniSONE (DELTASONE) 5 MG tablet   Oral   Take 5 mg by mouth daily.            BP 139/82  Pulse 91  Temp(Src) 98.4 F (36.9 C) (Oral)  Resp 14  Wt 95 lb 5 oz (43.233 kg)  SpO2 94% Physical Exam  Nursing note and vitals reviewed. Constitutional: She appears well-developed and well-nourished. She appears cachectic. She appears ill. No distress.  HENT:  Head: Normocephalic and atraumatic.  Eyes: Pupils are equal, round, and reactive to light.  Neck: Normal range of motion.  Neck supple.  Cardiovascular: Normal rate and regular rhythm.   Pulmonary/Chest:  Effort normal.  Abdominal: Soft. Bowel sounds are normal. There is no tenderness. There is no rigidity, no rebound and no guarding.  Genitourinary: Rectal exam shows tenderness.  The patient has changes due to radiation of her rectum with some erythema. NO induration. Mild tenderness but not severe. Rectal tone is appropriate. No puss or abnormal discharge and no bleeding.  Neurological: She is alert.  Pt is generally weak  Skin: Skin is warm and dry.    ED Course  Procedures (including critical care time) Labs Review Labs Reviewed  CBC WITH DIFFERENTIAL - Abnormal; Notable for the following:    RBC 3.69 (*)    Hemoglobin 11.6 (*)    Monocytes Relative 13 (*)    All other components within normal limits  COMPREHENSIVE METABOLIC PANEL - Abnormal; Notable for the following:    Creatinine, Ser 0.47 (*)    Albumin 3.4 (*)    Alkaline Phosphatase 160 (*)    GFR calc non Af Amer 88 (*)    All other components within normal limits  CLOSTRIDIUM DIFFICILE BY PCR  STOOL CULTURE  URINALYSIS, ROUTINE W REFLEX MICROSCOPIC   Imaging Review No results found.   EKG Interpretation None      MDM   Final diagnoses:  Diarrhea  Failure to thrive    Patient needs to be admitted for diarrhea and failure to thrive. Family interested in palliative care. Waiting for patient to pass urine and stool to test this for infection.  Admit to inpatient, [facility], hospt, [facility], team 8    Linus Mako, PA-C 04/29/13 1418

## 2013-04-30 ENCOUNTER — Telehealth: Payer: Self-pay

## 2013-04-30 DIAGNOSIS — K5289 Other specified noninfective gastroenteritis and colitis: Secondary | ICD-10-CM

## 2013-04-30 DIAGNOSIS — D649 Anemia, unspecified: Secondary | ICD-10-CM

## 2013-04-30 DIAGNOSIS — E43 Unspecified severe protein-calorie malnutrition: Secondary | ICD-10-CM | POA: Diagnosis present

## 2013-04-30 LAB — CBC
HEMATOCRIT: 31.3 % — AB (ref 36.0–46.0)
Hemoglobin: 9.9 g/dL — ABNORMAL LOW (ref 12.0–15.0)
MCH: 31.2 pg (ref 26.0–34.0)
MCHC: 31.6 g/dL (ref 30.0–36.0)
MCV: 98.7 fL (ref 78.0–100.0)
PLATELETS: 165 10*3/uL (ref 150–400)
RBC: 3.17 MIL/uL — ABNORMAL LOW (ref 3.87–5.11)
RDW: 15.1 % (ref 11.5–15.5)
WBC: 6 10*3/uL (ref 4.0–10.5)

## 2013-04-30 LAB — COMPREHENSIVE METABOLIC PANEL
ALBUMIN: 2.5 g/dL — AB (ref 3.5–5.2)
ALK PHOS: 120 U/L — AB (ref 39–117)
ALT: <5 U/L (ref 0–35)
AST: 17 U/L (ref 0–37)
BILIRUBIN TOTAL: 0.2 mg/dL — AB (ref 0.3–1.2)
BUN: 6 mg/dL (ref 6–23)
CHLORIDE: 102 meq/L (ref 96–112)
CO2: 22 mEq/L (ref 19–32)
Calcium: 8.3 mg/dL — ABNORMAL LOW (ref 8.4–10.5)
Creatinine, Ser: 0.54 mg/dL (ref 0.50–1.10)
GFR calc Af Amer: >90 mL/min (ref >90)
GFR calc non Af Amer: 84 mL/min — ABNORMAL LOW (ref >90)
Glucose, Bld: 59 mg/dL — ABNORMAL LOW (ref 70–99)
POTASSIUM: 3.5 meq/L — AB (ref 3.7–5.3)
Sodium: 141 mEq/L (ref 137–147)
Total Protein: 5.3 g/dL — ABNORMAL LOW (ref 6.0–8.3)

## 2013-04-30 MED ORDER — METRONIDAZOLE IN NACL 5-0.79 MG/ML-% IV SOLN
500.0000 mg | Freq: Three times a day (TID) | INTRAVENOUS | Status: DC
  Administered 2013-04-30 – 2013-05-03 (×10): 500 mg via INTRAVENOUS
  Filled 2013-04-30 (×12): qty 100

## 2013-04-30 MED ORDER — BOOST / RESOURCE BREEZE PO LIQD
1.0000 | Freq: Three times a day (TID) | ORAL | Status: DC
  Administered 2013-04-30 – 2013-05-02 (×7): 1 via ORAL

## 2013-04-30 MED ORDER — CIPROFLOXACIN IN D5W 400 MG/200ML IV SOLN
400.0000 mg | Freq: Two times a day (BID) | INTRAVENOUS | Status: DC
  Administered 2013-04-30 – 2013-05-02 (×6): 400 mg via INTRAVENOUS
  Filled 2013-04-30 (×7): qty 200

## 2013-04-30 MED ORDER — POTASSIUM CHLORIDE CRYS ER 20 MEQ PO TBCR
40.0000 meq | EXTENDED_RELEASE_TABLET | Freq: Once | ORAL | Status: AC
  Administered 2013-04-30: 40 meq via ORAL
  Filled 2013-04-30: qty 2

## 2013-04-30 NOTE — Progress Notes (Signed)
ANTIBIOTIC CONSULT NOTE - INITIAL  Pharmacy Consult for Cipro Indication: colitis  Allergies  Allergen Reactions  . Ativan [Lorazepam]     Confused and out of it    Patient Measurements: Height: 5\' 3"  (160 cm) Weight: 95 lb 5 oz (43.233 kg) IBW/kg (Calculated) : 52.4 Adjusted Body Weight:   Vital Signs: Temp: 99.6 F (37.6 C) (03/22 2244) Temp src: Oral (03/22 1502) BP: 117/72 mmHg (03/22 2244) Pulse Rate: 98 (03/22 2244) Intake/Output from previous day: 03/22 0701 - 03/23 0700 In: 240 [P.O.:240] Out: -  Intake/Output from this shift:    Labs:  Recent Labs  04/29/13 1130  WBC 6.2  HGB 11.6*  PLT 239  CREATININE 0.47*   Estimated Creatinine Clearance: 35.1 ml/min (by C-G formula based on Cr of 0.47). No results found for this basename: VANCOTROUGH, Corlis Leak, VANCORANDOM, [place], [place], [place], TOBRATROUGH, TOBRAPEAK, TOBRARND, AMIKACINPEAK, AMIKACINTROU, AMIKACIN,  in the last 72 hours   Microbiology: Recent Results (from the past 720 hour(s))  CLOSTRIDIUM DIFFICILE BY PCR     Status: None   Collection Time    04/29/13  2:29 PM      Result Value Ref Range Status   C difficile by pcr NEGATIVE  NEGATIVE Final   Comment: Performed at [facility] History: Past Medical History  Diagnosis Date  . CAD (coronary artery disease)     2 vessel intervention 2003  . Dyslipidemia   . CHF (congestive heart failure)     single episode  . Mitral regurgitation     mild prolapse anterior & posterior leaflets  . Aortic valve sclerosis     mild moderate calcification, echo, 2009  . HTN (hypertension)     difficult to obtain BP at times.  [name] Kitchen Urethral trauma     bleeding in hospital  . Rheumatoid arthritis(714.0)     severe - deforming  . Lung disease, interstitial     related to rheumatoid arthritis, also question  of left apical nodule...followed elsewhere..my understanding stabilized  . Carotid artery disease     doppler 12/25/2010,   0-39% bilateral  . Ejection fraction     EF 60%, echo, 11/2007  . Clot     ??? apical clot in the past ?? no longer an issue  . Drug therapy     Prednisone.  . Depression   . GERD (gastroesophageal reflux disease)   . Cataract   . Aortic stenosis     Moderately severe, echo, December, 2013  . Rectal bleed 04/06/2012  . Anemia   . 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 Cancer     rectal  . Edema     May, 2014  . History of radiation therapy 05/11/12-05/19/12    pelvis/inguinal lymphnodes/only able to complete 6 fx  . S/P radiation therapy 12/11/2012-12/21/2012    Rectum and anus / 12.5 Gy at 2.5 Gy per fraction x 5 fractions delivered every other day  . Degeneration macular     Medications:  Anti-infectives   Start     Dose/Rate Route Frequency Ordered Stop   04/30/13 0300  ciprofloxacin (CIPRO) IVPB 400 mg     400 mg 200 mL/hr over 60 Minutes Intravenous 2 times daily 04/30/13 0219     04/30/13 0230  metroNIDAZOLE (FLAGYL) IVPB 500 mg     500 mg 100 mL/hr over 60 Minutes Intravenous 3 times per day 04/30/13 0205       Assessment:  Patient with colitis.    Goal of Therapy:  Cipro dosed based on patient weight and renal function   Plan:  Follow up culture results Cipro 400mg  iv q12hr  Nani Skillern Crowford 04/30/2013,2:21 AM

## 2013-04-30 NOTE — Progress Notes (Addendum)
INITIAL NUTRITION ASSESSMENT  Pt meets criteria for severe MALNUTRITION in the context of chronic illness as evidenced by 3% weight loss in the past week per pt report in addition to pt with severe muscle wasting and subcutaneous fat loss in temporal/orbital region, clavicles, upper arms, and hands.  DOCUMENTATION CODES Per approved criteria  -Severe malnutrition in the context of chronic illness -Underweight   INTERVENTION: - Resource Breeze TID - Assisted pt with ordering meals - Diet advancement per MD - Recommend addition of Florastor to help with diarrhea  - Will continue to monitor   NUTRITION DIAGNOSIS: Inadequate oral intake related to clear liquid diet as evidenced by diet order.   Goal: 1. Resolution of diarrhea 2. Advance diet as tolerated to regular diet  Monitor:  Weights, labs, diet advancement, diarrhea  Reason for Assessment: Malnutrition screening tool   80 y.o. female  Admitting Dx: Diarrhea  ASSESSMENT: Pt discussed during multidisciplinary rounds. Pt with hx of CAD, aortic stenosis, HTN, rectal cancer s/p radiation therapy, and CHF who presents to the ED with several weeks of diarrhea with decreased appetite.   Met with pt and son who report pt's weight down 3 pounds in the past week r/t diarrhea (reportedly >6 times/day) and minimal PO intake. Pt reports only intake has been crackers and some Ensure Complete occasionally. Son reports pt has a large hiatal hernia. Pt denies any nausea/vomiting today but c/o ongoing diarrhea. Pt cachetic with visible severe muscle wasting and subcutaneous fat loss in orbital/temporal region, clavicles, upper arms, and hands. Reports tolerating broth this morning however does not like the taste. C. Difficile was negative.   Height: Ht Readings from Last 1 Encounters:  04/29/13 _0  (1.6 m)    Weight: Wt Readings from Last 1 Encounters:  04/29/13 95 lb 5 oz (43.233 kg)    Ideal Body Weight: 115 lb   % Ideal Body  Weight: 83%  Wt Readings from Last 10 Encounters:  04/29/13 95 lb 5 oz (43.233 kg)  03/08/13 98 lb 1.6 oz (44.498 kg)  02/09/13 104 lb (47.174 kg)  02/06/13 105 lb 11.2 oz (47.945 kg)  11/16/12 104 lb 3.2 oz (47.265 kg)  11/02/12 104 lb 1.6 oz (47.219 kg)  09/01/12 100 lb 11.2 oz (45.677 kg)  08/03/12 99 lb 1.6 oz (44.951 kg)  07/04/12 98 lb 12.8 oz (44.815 kg)  06/23/12 96 lb (43.545 kg)    Usual Body Weight: 98 lb   % Usual Body Weight: 97%  BMI:  Body mass index is 16.89 kg/(m^2). Underweight  Estimated Nutritional Needs: Kcal: 1500-1700 Protein: 65-80g Fluid: 1.5-1.7L/day  Skin: +2 RLE, +1 LLE edema  Diet Order: Clear Liquid  EDUCATION NEEDS: -No education needs identified at this time   Intake/Output Summary (Last 24 hours) at 04/30/13 1144 Last data filed at 04/30/13 0845  Gross per 24 hour  Intake    600 ml  Output      0 ml  Net    600 ml    Last BM: 3/22  Labs:   Recent Labs Lab 04/29/13 1130 04/30/13 0450  NA 140 141  K 4.3 3.5*  CL 98 102  CO2 26 22  BUN 9 6  CREATININE 0.47* 0.54  CALCIUM 10.2 8.3*  GLUCOSE 93 59*    CBG (last 3)  No results found for this basename: GLUCAP,  in the last 72 hours  Scheduled Meds: . amitriptyline  50 mg Oral QHS  . ciprofloxacin  400 mg Intravenous BID  .  heparin  5,000 Units Subcutaneous 3 times per day  . leflunomide  20 mg Oral Daily  . metoprolol succinate  100 mg Oral QPM  . metronidazole  500 mg Intravenous 3 times per day  . pantoprazole  40 mg Oral Daily  . predniSONE  5 mg Oral QAC breakfast    Continuous Infusions: . sodium chloride 100 mL/hr at 04/30/13 0310    Past Medical History  Diagnosis Date  . CAD (coronary artery disease)     2 vessel intervention 2003  . Dyslipidemia   . CHF (congestive heart failure)     single episode  . Mitral regurgitation     mild prolapse anterior & posterior leaflets  . Aortic valve sclerosis     mild moderate calcification, echo, 2009  .  HTN (hypertension)     difficult to obtain BP at times.  [name] Kitchen Urethral trauma     bleeding in hospital  . Rheumatoid arthritis(714.0)     severe - deforming  . Lung disease, interstitial     related to rheumatoid arthritis, also question  of left apical nodule...followed elsewhere..my understanding stabilized  . Carotid artery disease     doppler 12/25/2010,  0-39% bilateral  . Ejection fraction     EF 60%, echo, 11/2007  . Clot     ??? apical clot in the past ?? no longer an issue  . Drug therapy     Prednisone.  . Depression   . GERD (gastroesophageal reflux disease)   . Cataract   . Aortic stenosis     Moderately severe, echo, December, 2013  . Rectal bleed 04/06/2012  . Anemia   . 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 Cancer     rectal  . Edema     May, 2014  . History of radiation therapy 05/11/12-05/19/12    pelvis/inguinal lymphnodes/only able to complete 6 fx  . S/P radiation therapy 12/11/2012-12/21/2012    Rectum and anus / 12.5 Gy at 2.5 Gy per fraction x 5 fractions delivered every other day  . Degeneration macular     Past Surgical History  Procedure Laterality Date  . Appendectomy    . Breast surgery    . Coronary angioplasty with stent placement  2003  . Colonoscopy N/A 04/08/2012    Procedure: COLONOSCOPY;  Surgeon: Winfield Cunas., MD;  [facility] END[facility]COPY;  Service: Endoscopy;  Laterality: N/A;  . Lymph node biopsy  04/12/12    right inguinal lymph node-squamous cell carcinoma  . Tonsillectomy      Mikey College MS, [place], [place] Pager [phone] After Hours Pager

## 2013-04-30 NOTE — Progress Notes (Signed)
[facility] PROGRESS NOTE  [name] ZOX:[id number] DOB: [birth year] DOA: 04/29/2013 PCP: Tula Nakayama, MD  Assessment/Plan: Diarrhea with colitis 1. No sick contacts 2. Stool studies pending 3. Cont with IVF as tolerated 4. CT with findings of colitis 5. Now on cipro/flagyl HTN  1. BP stable 2. Cont meds CAD  1. Stable Anal Cancer  1. Pt is s/p radiation tx 2. May consider Palliative Care consult if no improvement of above DVT prophylaxis  1. Heparin  Code Status: Full Family Communication: Pt in room (indicate person spoken with, relationship, and if by phone, the number) Disposition Plan: Pending   Consultants:    Procedures:    Antibiotics:  Cipro 3/23>>>  Flagyl 3/23>>>  HPI/Subjective: No acute events noted overnight. Still with diarrhea  Objective: Filed Vitals:   04/29/13 2244 04/30/13 0528 04/30/13 0932 04/30/13 1031  BP: 117/72 97/66 141/71 132/79  Pulse: 98  89 91  Temp: 99.6 F (37.6 C) 98.9 F (37.2 C) 97.9 F (36.6 C) 97.7 F (36.5 C)  TempSrc:  Axillary Oral Oral  Resp: 20 20 18 18   Height:      Weight:      SpO2: 94% 93%  94%    Intake/Output Summary (Last 24 hours) at 04/30/13 1406 Last data filed at 04/30/13 0845  Gross per 24 hour  Intake    600 ml  Output      0 ml  Net    600 ml   Filed Weights   04/29/13 1124  Weight: 43.233 kg (95 lb 5 oz)    Exam:   General:  Awake, in nad  Cardiovascular: regular, s1, s2  Respiratory: normal resp effort, no wheezing  Abdomen: soft, nondistended  Musculoskeletal: perfused, no clubbing   Data Reviewed: Basic Metabolic Panel:  Recent Labs Lab 04/29/13 1130 04/30/13 0450  NA 140 141  K 4.3 3.5*  CL 98 102  CO2 26 22  GLUCOSE 93 59*  BUN 9 6  CREATININE 0.47* 0.54  CALCIUM 10.2 8.3*   Liver Function Tests:  Recent Labs Lab 04/29/13 1130 04/30/13 0450  AST 33 17  ALT 8 <5  ALKPHOS 160* 120*  BILITOT 0.4 0.2*  PROT 7.2 5.3*  ALBUMIN  3.4* 2.5*   No results found for this basename: LIPASE, AMYLASE,  in the last 168 hours No results found for this basename: AMMONIA,  in the last 168 hours CBC:  Recent Labs Lab 04/29/13 1130 04/30/13 0450  WBC 6.2 6.0  NEUTROABS 4.4  --   HGB 11.6* 9.9*  HCT 36.0 31.3*  MCV 97.6 98.7  PLT 239 165   Cardiac Enzymes: No results found for this basename: CKTOTAL, CKMB, CKMBINDEX, TROPONINI,  in the last 168 hours BNP (last 3 results) No results found for this basename: PROBNP,  in the last 8760 hours CBG: No results found for this basename: GLUCAP,  in the last 168 hours  Recent Results (from the past 240 hour(s))  CLOSTRIDIUM DIFFICILE BY PCR     Status: None   Collection Time    04/29/13  2:29 PM      Result Value Ref Range Status   C difficile by pcr NEGATIVE  NEGATIVE Final   Comment: Performed at [facility]     Studies: Ct Abdomen Pelvis W Contrast  04/29/2013   CLINICAL DATA:  80 year old female with abdominal and pelvic pain and diarrhea.  EXAM: CT ABDOMEN AND PELVIS WITH CONTRAST  TECHNIQUE: Multidetector CT imaging of the abdomen  and pelvis was performed using the standard protocol following bolus administration of intravenous contrast.  CONTRAST:  50mL OMNIPAQUE IOHEXOL 300 MG/ML SOLN, OMNIPAQUE IOHEXOL 300 MG/ML SOLN  COMPARISON:  02/02/2012 and prior CTs  FINDINGS: Circumferential wall thickening of the entire colon is compatible with a colitis -likely pseudomembranous.  There is no evidence of bowel obstruction, pneumoperitoneum or abscess.  A very large hiatal hernia is noted with the the majority of the stomach located intrathoracic.  A 1.8 cm gallstone is again noted.  Intra hepatic and extrahepatic biliary dilatation noted with the CBD measuring 12 mm in greatest diameter. No obstructing causes identified.  No focal hepatic lesions are present.  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   Right inguinal and pelvic sidewall adenopathy is again noted, slightly decreased in size from the last study.  There is no evidence abdominal aortic aneurysm.  No acute or suspicious bony abnormalities are present.  IMPRESSION: Pancolonic wall thickening compatible with colitis - likely infectious/pseudomembranous. No evidence of bowel obstruction, pneumoperitoneum or abscess.  Intra and extrahepatic biliary dilatation with CBD measuring up to 12 mm. No obstructing cause identified and consider further evaluation with ERCP/ MRCP.  1.8 cm gallstone.  Very large hiatal hernia with intrathoracic stomach -unchanged.  Decreased right inguinal and pelvic sidewall adenopathy from 2013.   Electronically Signed   By: Laveda Abbe M.D.   On: 04/29/2013 18:22    Scheduled Meds: . amitriptyline  50 mg Oral QHS  . ciprofloxacin  400 mg Intravenous BID  . feeding supplement (RESOURCE BREEZE)  1 Container Oral TID BM  . heparin  5,000 Units Subcutaneous 3 times per day  . leflunomide  20 mg Oral Daily  . metoprolol succinate  100 mg Oral QPM  . metronidazole  500 mg Intravenous 3 times per day  . pantoprazole  40 mg Oral Daily  . predniSONE  5 mg Oral QAC breakfast   Continuous Infusions: . sodium chloride 100 mL/hr at 04/30/13 0310    Principal Problem:   Diarrhea Active Problems:   HYPERTENSION   Mitral regurgitation   Carotid artery disease   CHF (congestive heart failure)   CAD (coronary artery disease)   Anal squamous cell carcinoma   Dehydration   Protein-calorie malnutrition, severe  Time spent:  Nickie Warwick K  [facility] Pager [phone]. If 7PM-7AM, please contact night-coverage at [email or web address], password [facility] 04/30/2013, 2:06 PM  LOS: 1 day

## 2013-04-30 NOTE — Telephone Encounter (Signed)
Received call from Delanna Notice that patient has been admitted and wanted to  inform Dr.Wentworth as patient scheduled for follow up on 05/03/13.I will forward room number and scan results tomorrow as Dr. Enos Fling is out of office today.

## 2013-05-01 NOTE — Progress Notes (Signed)
Pt  Reported eating  Red jello for supper and had pink tinge stool this AM, day shift nurse  Made aware. Will continue to monitor,

## 2013-05-01 NOTE — Progress Notes (Addendum)
Patient encouraged/assisted to sit in the recliner for at least one hour.  She verbalized she would like to try food, that she is tired of liquids only.  When she has a bowel movement it appears red but has been drinking red drinks and eating red jello so unsure if blood in stool.  Notified Dr. Wyline Copas

## 2013-05-02 DIAGNOSIS — E876 Hypokalemia: Secondary | ICD-10-CM

## 2013-05-02 DIAGNOSIS — E43 Unspecified severe protein-calorie malnutrition: Secondary | ICD-10-CM

## 2013-05-02 DIAGNOSIS — K5100 Ulcerative (chronic) pancolitis without complications: Principal | ICD-10-CM

## 2013-05-02 LAB — BASIC METABOLIC PANEL
CALCIUM: 8.1 mg/dL — AB (ref 8.4–10.5)
CO2: 21 meq/L (ref 19–32)
CREATININE: 0.43 mg/dL — AB (ref 0.50–1.10)
Chloride: 106 mEq/L (ref 96–112)
GFR calc Af Amer: >90 mL/min (ref >90)
GFR calc non Af Amer: >90 mL/min (ref >90)
Glucose, Bld: 105 mg/dL — ABNORMAL HIGH (ref 70–99)
Potassium: 3.1 mEq/L — ABNORMAL LOW (ref 3.7–5.3)
Sodium: 142 mEq/L (ref 137–147)

## 2013-05-02 LAB — CBC
HCT: 31.1 % — ABNORMAL LOW (ref 36.0–46.0)
Hemoglobin: 10.1 g/dL — ABNORMAL LOW (ref 12.0–15.0)
MCH: 31.1 pg (ref 26.0–34.0)
MCHC: 32.5 g/dL (ref 30.0–36.0)
MCV: 95.7 fL (ref 78.0–100.0)
Platelets: 196 10*3/uL (ref 150–400)
RBC: 3.25 MIL/uL — ABNORMAL LOW (ref 3.87–5.11)
RDW: 15.1 % (ref 11.5–15.5)
WBC: 4.9 10*3/uL (ref 4.0–10.5)

## 2013-05-02 MED ORDER — POTASSIUM CHLORIDE CRYS ER 20 MEQ PO TBCR
40.0000 meq | EXTENDED_RELEASE_TABLET | Freq: Once | ORAL | Status: AC
  Administered 2013-05-02: 40 meq via ORAL
  Filled 2013-05-02: qty 2

## 2013-05-02 NOTE — Progress Notes (Signed)
Patient seen at her request as an inpatient. She had a followup scheduled me tomorrow. Clearly she will missed that. She is clinically improving. On her CT scan her lymphadenopathy seems to be improved from her scan a year ago. She has no rectal bleeding. I will plan on seeing her in followup in June. She has followup with medical oncology in April. Her son and the patient know they can call me with any questions in the interim.

## 2013-05-02 NOTE — Progress Notes (Signed)
[facility] PROGRESS NOTE  [name] XBM:[id number] DOB: 31-Oct-1925 DOA: 04/29/2013 PCP: Tula Nakayama, MD   Brief narrative 80 y/o female with hx of CAD, AS, HTN, rectal ca, hx of CHF, presented to ED with several weeks of diarrhea with decreased appetite. CT scan of abdomen in ED showing pancolitis possibly infectious. hospitalist admitted patient for further management.   Assessment/Plan: pancolitis  likely infectious. improving clinically  continue ciprofloxacin and Flagyl. C. difficile and stool culture negative.  Hypertension Blood pressure stable. Resume metoprolol upon discharge is stable.  CAD Stable.   Dilated CBD on CT scan Admission CT scan showing dilated CBD with elevated alkaline phosphatase on labs. Patient also reported having right upper quadrant abdominal pain upon presentation. Patient has had cholecystostomy 2 years back . Possibly had passage  of CBD stone. Does not have for the right upper quadrant pain. Alkaline phosphatase has been trending down. Monitoring a.m.  History of rectal cancer Status post radiation therapy. Follow up as outpatient  Hypokalemia Replenish  RA continue leflunamide and prednisone    Code Status: full code Family Communication: called son to update Disposition Plan: home in am if continues to improve   Consultants: None  Procedures:  none  Antibiotics:  cipro and flagyl  HPI/Subjective: Patient seen and examined this morning. Reports feeling better but still has 2-3 loose BM . Has some abdominal bloating  Objective: Filed Vitals:   05/02/13 1400  BP: 116/57  Pulse: 91  Temp: 98.3 F (36.8 C)  Resp: 18    Intake/Output Summary (Last 24 hours) at 05/02/13 1548 Last data filed at 05/02/13 1300  Gross per 24 hour  Intake   5655 ml  Output      0 ml  Net   5655 ml   Filed Weights   04/29/13 1124  Weight: 43.233 kg (95 lb 5 oz)    Exam:   General:  Energy thin built female in  no acute distress  HEENT: No pallor, moist oral mucosa  Chest: Clear to auscultation bilaterally  CVS normal S1 and S2, no murmurs or gallops  Abdomen: Soft, nontender, nondistended, bowel sounds present  Extremities: Warm no edema  CNS: AAOX3  Data Reviewed: Basic Metabolic Panel:  Recent Labs Lab 04/29/13 1130 04/30/13 0450 05/02/13 0645  NA 140 141 142  K 4.3 3.5* 3.1*  CL 98 102 106  CO2 26 22 21   GLUCOSE 93 59* 105*  BUN 9 6 <3*  CREATININE 0.47* 0.54 0.43*  CALCIUM 10.2 8.3* 8.1*   Liver Function Tests:  Recent Labs Lab 04/29/13 1130 04/30/13 0450  AST 33 17  ALT 8 <5  ALKPHOS 160* 120*  BILITOT 0.4 0.2*  PROT 7.2 5.3*  ALBUMIN 3.4* 2.5*   No results found for this basename: LIPASE, AMYLASE,  in the last 168 hours No results found for this basename: AMMONIA,  in the last 168 hours CBC:  Recent Labs Lab 04/29/13 1130 04/30/13 0450 05/02/13 0645  WBC 6.2 6.0 4.9  NEUTROABS 4.4  --   --   HGB 11.6* 9.9* 10.1*  HCT 36.0 31.3* 31.1*  MCV 97.6 98.7 95.7  PLT 239 165 196   Cardiac Enzymes: No results found for this basename: CKTOTAL, CKMB, CKMBINDEX, TROPONINI,  in the last 168 hours BNP (last 3 results) No results found for this basename: PROBNP,  in the last 8760 hours CBG: No results found for this basename: GLUCAP,  in the last 168 hours  Recent Results (from the past  240 hour(s))  CLOSTRIDIUM DIFFICILE BY PCR     Status: None   Collection Time    04/29/13  2:29 PM      Result Value Ref Range Status   C difficile by pcr NEGATIVE  NEGATIVE Final   Comment: Performed at [facility]  STOOL CULTURE     Status: None   Collection Time    04/29/13  2:29 PM      Result Value Ref Range Status   Specimen Description STOOL   Final   Special Requests Normal   Final   Culture     Final   Value: NO SUSPICIOUS COLONIES, CONTINUING TO HOLD     Performed at [facility]   Report Status PENDING   Incomplete     Studies: No  results found.  Scheduled Meds: . amitriptyline  50 mg Oral QHS  . ciprofloxacin  400 mg Intravenous BID  . feeding supplement (RESOURCE BREEZE)  1 Container Oral TID BM  . heparin  5,000 Units Subcutaneous 3 times per day  . leflunomide  20 mg Oral Daily  . metoprolol succinate  100 mg Oral QPM  . metronidazole  500 mg Intravenous 3 times per day  . pantoprazole  40 mg Oral Daily  . predniSONE  5 mg Oral QAC breakfast   Continuous Infusions: . sodium chloride 100 mL (05/02/13 0746)      Time spent: 25 minutes    Doug Bucklin  [facility] Pager [phone]. If 7PM-7AM, please contact night-coverage at [email or web address], password [facility] 05/02/2013, 3:48 PM  LOS: 3 days

## 2013-05-03 ENCOUNTER — Ambulatory Visit: Payer: Medicare Other | Admitting: Radiation Oncology

## 2013-05-03 ENCOUNTER — Telehealth: Payer: Self-pay | Admitting: Medical Oncology

## 2013-05-03 ENCOUNTER — Other Ambulatory Visit: Payer: Self-pay | Admitting: Medical Oncology

## 2013-05-03 DIAGNOSIS — E876 Hypokalemia: Secondary | ICD-10-CM

## 2013-05-03 LAB — COMPREHENSIVE METABOLIC PANEL
ALBUMIN: 2.2 g/dL — AB (ref 3.5–5.2)
ALT: 6 U/L (ref 0–35)
AST: 31 U/L (ref 0–37)
Alkaline Phosphatase: 116 U/L (ref 39–117)
CALCIUM: 7.8 mg/dL — AB (ref 8.4–10.5)
CO2: 21 mEq/L (ref 19–32)
CREATININE: 0.46 mg/dL — AB (ref 0.50–1.10)
Chloride: 110 mEq/L (ref 96–112)
GFR calc Af Amer: >90 mL/min (ref >90)
GFR calc non Af Amer: 88 mL/min — ABNORMAL LOW (ref >90)
Glucose, Bld: 98 mg/dL (ref 70–99)
Potassium: 2.8 mEq/L — CL (ref 3.7–5.3)
Sodium: 142 mEq/L (ref 137–147)
Total Bilirubin: <0.2 mg/dL — ABNORMAL LOW (ref 0.3–1.2)
Total Protein: 4.9 g/dL — ABNORMAL LOW (ref 6.0–8.3)

## 2013-05-03 LAB — STOOL CULTURE: SPECIAL REQUESTS: NORMAL

## 2013-05-03 LAB — MAGNESIUM: MAGNESIUM: 1.4 mg/dL — AB (ref 1.5–2.5)

## 2013-05-03 MED ORDER — POTASSIUM CHLORIDE CRYS ER 20 MEQ PO TBCR: 40.0000 meq | EXTENDED_RELEASE_TABLET | Freq: Every day | ORAL | Status: DC

## 2013-05-03 MED ORDER — CIPROFLOXACIN HCL 500 MG PO TABS: 500.0000 mg | ORAL_TABLET | Freq: Two times a day (BID) | ORAL | Status: DC

## 2013-05-03 MED ORDER — POTASSIUM CHLORIDE CRYS ER 20 MEQ PO TBCR
40.0000 meq | EXTENDED_RELEASE_TABLET | ORAL | Status: AC
  Administered 2013-05-03 (×2): 40 meq via ORAL
  Filled 2013-05-03 (×3): qty 2

## 2013-05-03 MED ORDER — METRONIDAZOLE 500 MG PO TABS: 500.0000 mg | ORAL_TABLET | Freq: Three times a day (TID) | ORAL | Status: DC

## 2013-05-03 MED ORDER — CIPROFLOXACIN HCL 500 MG PO TABS
500.0000 mg | ORAL_TABLET | Freq: Two times a day (BID) | ORAL | Status: DC
  Administered 2013-05-03: 500 mg via ORAL
  Filled 2013-05-03 (×3): qty 1

## 2013-05-03 MED ORDER — MAGNESIUM SULFATE 40 MG/ML IJ SOLN
2.0000 g | Freq: Once | INTRAMUSCULAR | Status: AC
  Administered 2013-05-03: 2 g via INTRAVENOUS
  Filled 2013-05-03: qty 50

## 2013-05-03 MED ORDER — METRONIDAZOLE 500 MG PO TABS
500.0000 mg | ORAL_TABLET | Freq: Three times a day (TID) | ORAL | Status: DC
  Administered 2013-05-03 (×2): 500 mg via ORAL
  Filled 2013-05-03 (×4): qty 1

## 2013-05-03 MED ORDER — POTASSIUM CHLORIDE CRYS ER 20 MEQ PO TBCR: 40.0000 meq | EXTENDED_RELEASE_TABLET | ORAL | Status: DC

## 2013-05-03 NOTE — Telephone Encounter (Signed)
Pt's son Legrand Como called stating that his mother is being discharged. She will need to have her potassium level checked on Monday. He would like to know if Dr.Chism would let her come here for labs. Per Dr. Juliann Mule this is fine. POF made and son is aware the schedulers will call her with an appointment time.

## 2013-05-03 NOTE — Discharge Summary (Signed)
Physician Discharge Summary  [name] NWG:[id number] DOB: 1926-01-16 DOA: 04/29/2013  PCP: Tula Nakayama, MD  Admit date: 04/29/2013 Discharge date: 05/03/2013  Time spent: 40  minutes  Recommendations for Outpatient Follow-up:  Home with outpt follow up with her oncologist . Please check potassium level early next week.  Discharge Diagnoses:   Principal Problem:   Pancolitis  Active Problems:   Anal squamous cell carcinoma   HYPERTENSION   Mitral regurgitation   Carotid artery disease   CHF (congestive heart failure)   CAD (coronary artery disease)   Hypokalemia   Diarrhea   Dehydration   Protein-calorie malnutrition, severe   Discharge Condition: fair  Diet recommendation: cardiac  Filed Weights   04/29/13 1124  Weight: 43.233 kg (95 lb 5 oz)    History of present illness:  Please refer to admission H&P for details, but in brief, 80 y/o female with hx of CAD, AS, HTN, rectal ca, hx of CHF, presented to ED with several weeks of diarrhea with decreased appetite. CT scan of abdomen in ED showing pancolitis possibly infectious. hospitalist admitted patient for further management.   Hospital Course:  pancolitis  likely infectious. improving clinically although still has some loose BMs. Remains afebrile and no leucocytosis. abdominal pain resolved.  continue ciprofloxacin and Flagyl. Will treat for 10 day course. ( completes on 3/31) C. difficile and stool culture negative.  -can continue imodium as needed.  Hypertension  Blood pressure stable. Resume metoprolol upon discharge   CAD  Stable.   Dilated CBD on CT scan  Admission CT scan showing dilated CBD with elevated alkaline phosphatase on labs. Patient also reported having right upper quadrant abdominal pain upon presentation.  Patient has had cholecystostomy 2 years back . Possibly had passage of CBD stone. Does not have further right upper quadrant pain. Alkaline phosphatase trending down to  normal . Monitoring a.m.   Squamous cell Anal ca Status post radiation therapy. Follow up as outpatient with Dr Rosie Fate.  Hypokalemia  Replenished with po kcl. Low mg replenished. Will discharge on po kcl.  Needs to have her potassium checked early next week.  RA  continue leflunamide and prednisone   Code Status: full code   Family Communication: son at bedside Disposition Plan: home with outpt follow up at cancer center next week  Consultants:  None    Procedures:  None   Antibiotics:  cipro and flagyl   Discharge Exam: Filed Vitals:   05/03/13 1117  BP: 124/73  Pulse: 90  Temp: 97.7 F (36.5 C)  Resp: 19    General: Elderly thin built female in no acute distress  HEENT: No pallor, moist oral mucosa  Chest: Clear to auscultation bilaterally  CVS normal S1 and S2, no murmurs or gallops  Abdomen: Soft, nontender, nondistended, bowel sounds present  Extremities: Warm no edema  CNS: AAOX3   Discharge Instructions   Future Appointments Provider Department Dept Phone   05/15/2013 9:00 AM [facility] 5 [facility] [phone]   05/15/2013 9:30 AM [facility] Covering Provider 1 [facility] [phone]   05/31/2013 9:15 AM Max Maud Deed, DPM [facility] [phone]   08/02/2013 2:00 PM Lurline Hare, MD [facility] [phone]       Medication List    STOP taking these medications       POTASSIUM CHLORIDE PO      TAKE these medications  acetaminophen 500 MG tablet  Commonly known as:  TYLENOL  Take 1,000 mg by mouth every 6 (six) hours as needed for pain.     amitriptyline 50 MG tablet  Commonly known as:  ELAVIL  Take 50 mg by mouth at bedtime.     ciprofloxacin 500 MG tablet  Commonly known as:  CIPRO  Take 1 tablet (500 mg total) by mouth 2 (two) times daily. Until 3/31     ENSURE PLUS Liqd  Take 237 mLs by mouth 2 (two)  times daily as needed (for suboptimal po intake.).     esomeprazole 40 MG capsule  Commonly known as:  NEXIUM  Take 1 capsule (40 mg total) by mouth daily before breakfast.     EXCEDRIN EXTRA STRENGTH 250-250-65 MG per tablet  Generic drug:  aspirin-acetaminophen-caffeine  Take 2 tablets by mouth every 6 (six) hours as needed for pain.     hydroxypropyl methylcellulose 2.5 % ophthalmic solution  Commonly known as:  ISOPTO TEARS  Place 1 drop into both eyes 3 (three) times daily as needed for dry eyes.     leflunomide 20 MG tablet  Commonly known as:  ARAVA  Take 20 mg by mouth daily.     loperamide 2 MG capsule  Commonly known as:  IMODIUM  Take 2 mg by mouth 4 (four) times daily as needed for diarrhea or loose stools.     metoprolol succinate 100 MG 24 hr tablet  Commonly known as:  TOPROL-XL  Take 1 tablet (100 mg total) by mouth every evening. Take with or immediately following a meal.     metroNIDAZOLE 500 MG tablet  Commonly known as:  FLAGYL  Take 1 tablet (500 mg total) by mouth every 8 (eight) hours. Until 3/31     potassium chloride SA 20 MEQ tablet  Commonly known as:  K-DUR,KLOR-CON  Take 2 tablets (40 mEq total) daily for 5 days     predniSONE 5 MG tablet  Commonly known as:  DELTASONE  Take 5 mg by mouth daily.     PRESERVISION AREDS PO  Take 1 capsule by mouth daily.       Allergies  Allergen Reactions  . Ativan [Lorazepam]     Confused and out of it       Follow-up Information   Follow up with CHISM, DAVID, MD In 1 week.   Specialty:  Internal Medicine   Contact information:   [address] [place] [address] [phone]        The results of significant diagnostics from this hospitalization (including imaging, microbiology, ancillary and laboratory) are listed below for reference.    Significant Diagnostic Studies: Ct Abdomen Pelvis W Contrast  04/29/2013   CLINICAL DATA:  80 year old female with abdominal and pelvic pain and  diarrhea.  EXAM: CT ABDOMEN AND PELVIS WITH CONTRAST  TECHNIQUE: Multidetector CT imaging of the abdomen and pelvis was performed using the standard protocol following bolus administration of intravenous contrast.  CONTRAST:  50mL OMNIPAQUE IOHEXOL 300 MG/ML SOLN, OMNIPAQUE IOHEXOL 300 MG/ML SOLN  COMPARISON:  02/02/2012 and prior CTs  FINDINGS: Circumferential wall thickening of the entire colon is compatible with a colitis -likely pseudomembranous.  There is no evidence of bowel obstruction, pneumoperitoneum or abscess.  A very large hiatal hernia is noted with the the majority of the stomach located intrathoracic.  A 1.8 cm gallstone is again noted.  Intra hepatic and extrahepatic biliary dilatation noted with the CBD measuring 12 mm  in greatest diameter. No obstructing causes identified.  No focal hepatic lesions are present.  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  Right inguinal and pelvic sidewall adenopathy is again noted, slightly decreased in size from the last study.  There is no evidence abdominal aortic aneurysm.  No acute or suspicious bony abnormalities are present.  IMPRESSION: Pancolonic wall thickening compatible with colitis - likely infectious/pseudomembranous. No evidence of bowel obstruction, pneumoperitoneum or abscess.  Intra and extrahepatic biliary dilatation with CBD measuring up to 12 mm. No obstructing cause identified and consider further evaluation with ERCP/ MRCP.  1.8 cm gallstone.  Very large hiatal hernia with intrathoracic stomach -unchanged.  Decreased right inguinal and pelvic sidewall adenopathy from 2013.   Electronically Signed   By: Laveda Abbe M.D.   On: 04/29/2013 18:22    Microbiology: Recent Results (from the past 240 hour(s))  CLOSTRIDIUM DIFFICILE BY PCR     Status: None   Collection Time    04/29/13  2:29 PM      Result Value Ref Range Status   C difficile by pcr NEGATIVE  NEGATIVE Final    Comment: Performed at [facility]  STOOL CULTURE     Status: None   Collection Time    04/29/13  2:29 PM      Result Value Ref Range Status   Specimen Description STOOL   Final   Special Requests Normal   Final   Culture     Final   Value: NO SALMONELLA, SHIGELLA, CAMPYLOBACTER, YERSINIA, OR E.COLI 0157:H7 ISOLATED     Performed at [facility]   Report Status 05/03/2013 FINAL   Final     Labs: Basic Metabolic Panel:  Recent Labs Lab 04/29/13 1130 04/30/13 0450 05/02/13 0645 05/03/13 0438 05/03/13 0530  NA 140 141 142 142  --   K 4.3 3.5* 3.1* 2.8*  --   CL 98 102 106 110  --   CO2 26 22 21 21   --   GLUCOSE 93 59* 105* 98  --   BUN 9 6 <3* <3*  --   CREATININE 0.47* 0.54 0.43* 0.46*  --   CALCIUM 10.2 8.3* 8.1* 7.8*  --   MG  --   --   --   --  1.4*   Liver Function Tests:  Recent Labs Lab 04/29/13 1130 04/30/13 0450 05/03/13 0438  AST 33 17 31  ALT 8 <5 6  ALKPHOS 160* 120* 116  BILITOT 0.4 0.2* <0.2*  PROT 7.2 5.3* 4.9*  ALBUMIN 3.4* 2.5* 2.2*   No results found for this basename: LIPASE, AMYLASE,  in the last 168 hours No results found for this basename: AMMONIA,  in the last 168 hours CBC:  Recent Labs Lab 04/29/13 1130 04/30/13 0450 05/02/13 0645  WBC 6.2 6.0 4.9  NEUTROABS 4.4  --   --   HGB 11.6* 9.9* 10.1*  HCT 36.0 31.3* 31.1*  MCV 97.6 98.7 95.7  PLT 239 165 196   Cardiac Enzymes: No results found for this basename: CKTOTAL, CKMB, CKMBINDEX, TROPONINI,  in the last 168 hours BNP: BNP (last 3 results) No results found for this basename: PROBNP,  in the last 8760 hours CBG: No results found for this basename: GLUCAP,  in the last 168 hours     Signed:  Dustyn Armbrister  [facility] 05/03/2013, 1:04 PM

## 2013-05-03 NOTE — Progress Notes (Signed)
ANTIBIOTIC CONSULT NOTE - FOLLOW UP  Pharmacy Consult for IV Cipro/Flagyl Indication: pancolitis   Allergies  Allergen Reactions  . Ativan [Lorazepam]     Confused and out of it    Patient Measurements: Height: 5\' 3"  (160 cm) Weight: 95 lb 5 oz (43.233 kg) IBW/kg (Calculated) : 52.4   Vital Signs: Temp: 98.4 F (36.9 C) (03/26 0600) Temp src: Oral (03/26 0600) BP: 129/63 mmHg (03/26 0600) Pulse Rate: 85 (03/26 0600) Intake/Output from previous day: 03/25 0701 - 03/26 0700 In: 1200 [P.O.:600; IV Piggyback:600] Out: -  Intake/Output from this shift:    Labs:  Recent Labs  05/02/13 0645 05/03/13 0438  WBC 4.9  --   HGB 10.1*  --   PLT 196  --   CREATININE 0.43* 0.46*   Estimated Creatinine Clearance: 35.1 ml/min (by C-G formula based on Cr of 0.46). No results found for this basename: VANCOTROUGH, Corlis Leak, VANCORANDOM, Singer, GENTPEAK, GENTRANDOM, TOBRATROUGH, TOBRAPEAK, TOBRARND, AMIKACINPEAK, AMIKACINTROU, AMIKACIN,  in the last 72 hours   Microbiology: Recent Results (from the past 720 hour(s))  CLOSTRIDIUM DIFFICILE BY PCR     Status: None   Collection Time    04/29/13  2:29 PM      Result Value Ref Range Status   C difficile by pcr NEGATIVE  NEGATIVE Final   Comment: Performed at [facility]     Status: None   Collection Time    04/29/13  2:29 PM      Result Value Ref Range Status   Specimen Description STOOL   Final   Special Requests Normal   Final   Culture     Final   Value: NO SUSPICIOUS COLONIES, CONTINUING TO HOLD     Performed at [facility]   Report Status PENDING   Incomplete    Anti-infectives   Start     Dose/Rate Route Frequency Ordered Stop   04/30/13 0300  ciprofloxacin (CIPRO) IVPB 400 mg     400 mg 200 mL/hr over 60 Minutes Intravenous 2 times daily 04/30/13 0219     04/30/13 0230  metroNIDAZOLE (FLAGYL) IVPB 500 mg     500 mg 100 mL/hr over 60 Minutes Intravenous 3 times per day 04/30/13  0205        Assessment:  80 yo F with pancolitis, possibly infectious on D#4 IV Ciprofloxacin 400 mg IV q12h, Flagyl 500 mg IV q8h.  Scr is WNL, 0.46 with estimated CrCl 35 ml/min   AF, WBC WNL    3/23 >> Cipro IV >>  3/23 >> Flagyl IV >>   Micro  3/22 Stool culture - No suspicious colonies, pending   3/22 C.Diff PCR - Negative   Goal of Therapy:  Cirpro per renal function   Plan:  1.) Continue Cipro 400 mg IV Q12h 2.) Continue Flagyl 500 mg IV Q8h 3.) Monitor real function 4.) Defer changing Antibiotics to PO to MD  Lavra Imler, Gaye Alken PharmD Pager #: [phone] 7:33 AM 05/03/2013

## 2013-05-04 ENCOUNTER — Telehealth: Payer: Self-pay | Admitting: Internal Medicine

## 2013-05-04 NOTE — Telephone Encounter (Signed)
, °

## 2013-05-07 ENCOUNTER — Other Ambulatory Visit (HOSPITAL_BASED_OUTPATIENT_CLINIC_OR_DEPARTMENT_OTHER): Payer: Medicare Other

## 2013-05-07 ENCOUNTER — Encounter: Payer: Self-pay | Admitting: Medical Oncology

## 2013-05-07 ENCOUNTER — Other Ambulatory Visit: Payer: Medicare Other

## 2013-05-07 ENCOUNTER — Ambulatory Visit: Payer: Medicare Other

## 2013-05-07 DIAGNOSIS — C210 Malignant neoplasm of anus, unspecified: Secondary | ICD-10-CM

## 2013-05-07 DIAGNOSIS — D649 Anemia, unspecified: Secondary | ICD-10-CM

## 2013-05-07 DIAGNOSIS — C211 Malignant neoplasm of anal canal: Secondary | ICD-10-CM

## 2013-05-07 LAB — CBC WITH DIFFERENTIAL/PLATELET
BASO%: 0.4 % (ref 0.0–2.0)
BASOS ABS: 0 10*3/uL (ref 0.0–0.1)
EOS%: 1.6 % (ref 0.0–7.0)
Eosinophils Absolute: 0.2 10*3/uL (ref 0.0–0.5)
HCT: 34.3 % — ABNORMAL LOW (ref 34.8–46.6)
HEMOGLOBIN: 10.9 g/dL — AB (ref 11.6–15.9)
LYMPH#: 0.9 10*3/uL (ref 0.9–3.3)
LYMPH%: 8.5 % — ABNORMAL LOW (ref 14.0–49.7)
MCH: 31 pg (ref 25.1–34.0)
MCHC: 31.8 g/dL (ref 31.5–36.0)
MCV: 97.4 fL (ref 79.5–101.0)
MONO#: 1.3 10*3/uL — AB (ref 0.1–0.9)
MONO%: 11.7 % (ref 0.0–14.0)
NEUT#: 8.5 10*3/uL — ABNORMAL HIGH (ref 1.5–6.5)
NEUT%: 77.8 % — ABNORMAL HIGH (ref 38.4–76.8)
NRBC: 0 % (ref 0–0)
Platelets: 269 10*3/uL (ref 145–400)
RBC: 3.52 10*6/uL — AB (ref 3.70–5.45)
RDW: 15.9 % — AB (ref 11.2–14.5)
WBC: 10.9 10*3/uL — AB (ref 3.9–10.3)

## 2013-05-07 LAB — COMPREHENSIVE METABOLIC PANEL (CC13)
ALBUMIN: 3.2 g/dL — AB (ref 3.5–5.0)
ALT: 11 U/L (ref 0–55)
AST: 54 U/L — ABNORMAL HIGH (ref 5–34)
Alkaline Phosphatase: 136 U/L (ref 40–150)
Anion Gap: 12 mEq/L — ABNORMAL HIGH (ref 3–11)
BUN: 5 mg/dL — ABNORMAL LOW (ref 7.0–26.0)
CO2: 24 mEq/L (ref 22–29)
Calcium: 9.2 mg/dL (ref 8.4–10.4)
Chloride: 105 mEq/L (ref 98–109)
Creatinine: 0.6 mg/dL (ref 0.6–1.1)
Glucose: 79 mg/dl (ref 70–140)
Potassium: 4 mEq/L (ref 3.5–5.1)
SODIUM: 141 meq/L (ref 136–145)
TOTAL PROTEIN: 6.4 g/dL (ref 6.4–8.3)
Total Bilirubin: 0.31 mg/dL (ref 0.20–1.20)

## 2013-05-07 LAB — LACTATE DEHYDROGENASE (CC13): LDH: 356 U/L — ABNORMAL HIGH (ref 125–245)

## 2013-05-07 LAB — TECHNOLOGIST REVIEW

## 2013-05-07 NOTE — Progress Notes (Signed)
Pt's son called for results of pt's potassium level today. He states his mother is doing fair. She continues to take her antibiotics but she is still weak. I asked him to call if any fevers or decline of his mother. He voiced understanding.

## 2013-05-15 ENCOUNTER — Telehealth: Payer: Self-pay | Admitting: Internal Medicine

## 2013-05-15 ENCOUNTER — Ambulatory Visit (HOSPITAL_BASED_OUTPATIENT_CLINIC_OR_DEPARTMENT_OTHER): Payer: Medicare Other | Admitting: Internal Medicine

## 2013-05-15 ENCOUNTER — Ambulatory Visit: Payer: Medicare Other

## 2013-05-15 ENCOUNTER — Other Ambulatory Visit (HOSPITAL_BASED_OUTPATIENT_CLINIC_OR_DEPARTMENT_OTHER): Payer: Medicare Other

## 2013-05-15 VITALS — BP 129/48 | HR 90 | Temp 98.1°F | Resp 18 | Ht 63.0 in | Wt 96.2 lb

## 2013-05-15 DIAGNOSIS — C210 Malignant neoplasm of anus, unspecified: Secondary | ICD-10-CM

## 2013-05-15 DIAGNOSIS — C211 Malignant neoplasm of anal canal: Secondary | ICD-10-CM

## 2013-05-15 DIAGNOSIS — R197 Diarrhea, unspecified: Secondary | ICD-10-CM

## 2013-05-15 DIAGNOSIS — E876 Hypokalemia: Secondary | ICD-10-CM

## 2013-05-15 LAB — BASIC METABOLIC PANEL (CC13)
Anion Gap: 11 mEq/L (ref 3–11)
BUN: 10 mg/dL (ref 7.0–26.0)
CHLORIDE: 105 meq/L (ref 98–109)
CO2: 27 mEq/L (ref 22–29)
Calcium: 8.9 mg/dL (ref 8.4–10.4)
Creatinine: 0.6 mg/dL (ref 0.6–1.1)
Glucose: 94 mg/dl (ref 70–140)
Potassium: 3.4 mEq/L — ABNORMAL LOW (ref 3.5–5.1)
SODIUM: 142 meq/L (ref 136–145)

## 2013-05-15 NOTE — Telephone Encounter (Signed)
Gave pt appt for lab and MD  for April 2015 °

## 2013-05-16 ENCOUNTER — Other Ambulatory Visit: Payer: Self-pay

## 2013-05-16 ENCOUNTER — Encounter (HOSPITAL_COMMUNITY): Payer: Self-pay | Admitting: Emergency Medicine

## 2013-05-16 ENCOUNTER — Inpatient Hospital Stay (HOSPITAL_COMMUNITY)
Admission: EM | Admit: 2013-05-16 | Discharge: 2013-05-29 | DRG: 871 | Disposition: A | Discharge status: Home / Self Care | Payer: Medicare Other | Attending: Internal Medicine | Admitting: Internal Medicine

## 2013-05-16 DIAGNOSIS — Z923 Personal history of irradiation: Secondary | ICD-10-CM

## 2013-05-16 DIAGNOSIS — C210 Malignant neoplasm of anus, unspecified: Secondary | ICD-10-CM

## 2013-05-16 DIAGNOSIS — E8809 Other disorders of plasma-protein metabolism, not elsewhere classified: Secondary | ICD-10-CM | POA: Diagnosis present

## 2013-05-16 DIAGNOSIS — IMO0002 Reserved for concepts with insufficient information to code with codable children: Secondary | ICD-10-CM

## 2013-05-16 DIAGNOSIS — T66XXXS Radiation sickness, unspecified, sequela: Secondary | ICD-10-CM

## 2013-05-16 DIAGNOSIS — R112 Nausea with vomiting, unspecified: Secondary | ICD-10-CM | POA: Diagnosis present

## 2013-05-16 DIAGNOSIS — Y842 Radiological procedure and radiotherapy as the cause of abnormal reaction of the patient, or of later complication, without mention of misadventure at the time of the procedure: Secondary | ICD-10-CM | POA: Diagnosis present

## 2013-05-16 DIAGNOSIS — R110 Nausea: Secondary | ICD-10-CM

## 2013-05-16 DIAGNOSIS — R634 Abnormal weight loss: Secondary | ICD-10-CM | POA: Diagnosis present

## 2013-05-16 DIAGNOSIS — A419 Sepsis, unspecified organism: Principal | ICD-10-CM | POA: Diagnosis present

## 2013-05-16 DIAGNOSIS — Z79899 Other long term (current) drug therapy: Secondary | ICD-10-CM

## 2013-05-16 DIAGNOSIS — Z9089 Acquired absence of other organs: Secondary | ICD-10-CM

## 2013-05-16 DIAGNOSIS — K520 Gastroenteritis and colitis due to radiation: Secondary | ICD-10-CM | POA: Diagnosis present

## 2013-05-16 DIAGNOSIS — I5031 Acute diastolic (congestive) heart failure: Secondary | ICD-10-CM

## 2013-05-16 DIAGNOSIS — Z85048 Personal history of other malignant neoplasm of rectum, rectosigmoid junction, and anus: Secondary | ICD-10-CM

## 2013-05-16 DIAGNOSIS — D649 Anemia, unspecified: Secondary | ICD-10-CM

## 2013-05-16 DIAGNOSIS — K909 Intestinal malabsorption, unspecified: Secondary | ICD-10-CM | POA: Diagnosis present

## 2013-05-16 DIAGNOSIS — E876 Hypokalemia: Secondary | ICD-10-CM | POA: Diagnosis present

## 2013-05-16 DIAGNOSIS — I5033 Acute on chronic diastolic (congestive) heart failure: Secondary | ICD-10-CM | POA: Diagnosis present

## 2013-05-16 DIAGNOSIS — E860 Dehydration: Secondary | ICD-10-CM | POA: Diagnosis present

## 2013-05-16 DIAGNOSIS — M6250 Muscle wasting and atrophy, not elsewhere classified, unspecified site: Secondary | ICD-10-CM | POA: Diagnosis present

## 2013-05-16 DIAGNOSIS — Z8249 Family history of ischemic heart disease and other diseases of the circulatory system: Secondary | ICD-10-CM

## 2013-05-16 DIAGNOSIS — C801 Malignant (primary) neoplasm, unspecified: Secondary | ICD-10-CM

## 2013-05-16 DIAGNOSIS — F3289 Other specified depressive episodes: Secondary | ICD-10-CM | POA: Diagnosis present

## 2013-05-16 DIAGNOSIS — I359 Nonrheumatic aortic valve disorder, unspecified: Secondary | ICD-10-CM | POA: Diagnosis present

## 2013-05-16 DIAGNOSIS — R5381 Other malaise: Secondary | ICD-10-CM | POA: Diagnosis present

## 2013-05-16 DIAGNOSIS — I10 Essential (primary) hypertension: Secondary | ICD-10-CM | POA: Diagnosis present

## 2013-05-16 DIAGNOSIS — I059 Rheumatic mitral valve disease, unspecified: Secondary | ICD-10-CM | POA: Diagnosis present

## 2013-05-16 DIAGNOSIS — E43 Unspecified severe protein-calorie malnutrition: Secondary | ICD-10-CM | POA: Diagnosis present

## 2013-05-16 DIAGNOSIS — E785 Hyperlipidemia, unspecified: Secondary | ICD-10-CM | POA: Diagnosis present

## 2013-05-16 DIAGNOSIS — I350 Nonrheumatic aortic (valve) stenosis: Secondary | ICD-10-CM

## 2013-05-16 DIAGNOSIS — R627 Adult failure to thrive: Secondary | ICD-10-CM | POA: Diagnosis present

## 2013-05-16 DIAGNOSIS — K529 Noninfective gastroenteritis and colitis, unspecified: Secondary | ICD-10-CM

## 2013-05-16 DIAGNOSIS — K449 Diaphragmatic hernia without obstruction or gangrene: Secondary | ICD-10-CM | POA: Diagnosis present

## 2013-05-16 DIAGNOSIS — K5100 Ulcerative (chronic) pancolitis without complications: Secondary | ICD-10-CM | POA: Diagnosis present

## 2013-05-16 DIAGNOSIS — I2510 Atherosclerotic heart disease of native coronary artery without angina pectoris: Secondary | ICD-10-CM | POA: Diagnosis present

## 2013-05-16 DIAGNOSIS — Z9861 Coronary angioplasty status: Secondary | ICD-10-CM

## 2013-05-16 DIAGNOSIS — M069 Rheumatoid arthritis, unspecified: Secondary | ICD-10-CM | POA: Diagnosis present

## 2013-05-16 DIAGNOSIS — R5383 Other fatigue: Secondary | ICD-10-CM

## 2013-05-16 DIAGNOSIS — K219 Gastro-esophageal reflux disease without esophagitis: Secondary | ICD-10-CM | POA: Diagnosis present

## 2013-05-16 DIAGNOSIS — Z681 Body mass index (BMI) 19 or less, adult: Secondary | ICD-10-CM

## 2013-05-16 DIAGNOSIS — R197 Diarrhea, unspecified: Secondary | ICD-10-CM

## 2013-05-16 DIAGNOSIS — R000 Tachycardia, unspecified: Secondary | ICD-10-CM | POA: Diagnosis present

## 2013-05-16 DIAGNOSIS — G9341 Metabolic encephalopathy: Secondary | ICD-10-CM | POA: Diagnosis present

## 2013-05-16 DIAGNOSIS — F329 Major depressive disorder, single episode, unspecified: Secondary | ICD-10-CM | POA: Diagnosis present

## 2013-05-16 DIAGNOSIS — Z66 Do not resuscitate: Secondary | ICD-10-CM | POA: Diagnosis present

## 2013-05-16 DIAGNOSIS — I509 Heart failure, unspecified: Secondary | ICD-10-CM | POA: Diagnosis present

## 2013-05-16 DIAGNOSIS — J8410 Pulmonary fibrosis, unspecified: Secondary | ICD-10-CM | POA: Diagnosis present

## 2013-05-16 DIAGNOSIS — I779 Disorder of arteries and arterioles, unspecified: Secondary | ICD-10-CM | POA: Diagnosis present

## 2013-05-16 LAB — URINALYSIS, ROUTINE W REFLEX MICROSCOPIC
BILIRUBIN URINE: NEGATIVE
GLUCOSE, UA: NEGATIVE mg/dL
HGB URINE DIPSTICK: NEGATIVE
Ketones, ur: NEGATIVE mg/dL
Leukocytes, UA: NEGATIVE
NITRITE: NEGATIVE
PH: 5 (ref 5.0–8.0)
Protein, ur: 30 mg/dL — AB
Specific Gravity, Urine: 1.027 (ref 1.005–1.030)
Urobilinogen, UA: 0.2 mg/dL (ref 0.0–1.0)

## 2013-05-16 LAB — COMPREHENSIVE METABOLIC PANEL
ALT: 8 U/L (ref 0–35)
AST: 29 U/L (ref 0–37)
Albumin: 3.4 g/dL — ABNORMAL LOW (ref 3.5–5.2)
Alkaline Phosphatase: 99 U/L (ref 39–117)
BILIRUBIN TOTAL: 0.3 mg/dL (ref 0.3–1.2)
BUN: 11 mg/dL (ref 6–23)
CHLORIDE: 97 meq/L (ref 96–112)
CO2: 23 meq/L (ref 19–32)
Calcium: 9.5 mg/dL (ref 8.4–10.5)
Creatinine, Ser: 0.41 mg/dL — ABNORMAL LOW (ref 0.50–1.10)
GFR calc Af Amer: >90 mL/min (ref >90)
GLUCOSE: 106 mg/dL — AB (ref 70–99)
Potassium: 3.8 mEq/L (ref 3.7–5.3)
SODIUM: 138 meq/L (ref 137–147)
Total Protein: 7.1 g/dL (ref 6.0–8.3)

## 2013-05-16 LAB — CBC WITH DIFFERENTIAL/PLATELET
Basophils Absolute: 0 10*3/uL (ref 0.0–0.1)
Basophils Relative: 0 % (ref 0–1)
EOS ABS: 0.1 10*3/uL (ref 0.0–0.7)
Eosinophils Relative: 2 % (ref 0–5)
HCT: 33.2 % — ABNORMAL LOW (ref 36.0–46.0)
HEMOGLOBIN: 10.5 g/dL — AB (ref 12.0–15.0)
Lymphocytes Relative: 11 % — ABNORMAL LOW (ref 12–46)
Lymphs Abs: 0.6 10*3/uL — ABNORMAL LOW (ref 0.7–4.0)
MCH: 31.3 pg (ref 26.0–34.0)
MCHC: 31.6 g/dL (ref 30.0–36.0)
MCV: 98.8 fL (ref 78.0–100.0)
MONOS PCT: 15 % — AB (ref 3–12)
Monocytes Absolute: 0.8 10*3/uL (ref 0.1–1.0)
NEUTROS ABS: 3.8 10*3/uL (ref 1.7–7.7)
NEUTROS PCT: 72 % (ref 43–77)
Platelets: 192 10*3/uL (ref 150–400)
RBC: 3.36 MIL/uL — AB (ref 3.87–5.11)
RDW: 16.2 % — ABNORMAL HIGH (ref 11.5–15.5)
WBC: 5.2 10*3/uL (ref 4.0–10.5)

## 2013-05-16 LAB — URINE MICROSCOPIC-ADD ON

## 2013-05-16 LAB — LIPASE, BLOOD: Lipase: 62 U/L — ABNORMAL HIGH (ref 11–59)

## 2013-05-16 LAB — I-STAT TROPONIN, ED: Troponin i, poc: 0.01 ng/mL (ref 0.00–0.08)

## 2013-05-16 LAB — I-STAT CG4 LACTIC ACID, ED: Lactic Acid, Venous: 3.42 mmol/L — ABNORMAL HIGH (ref 0.5–2.2)

## 2013-05-16 MED ORDER — SODIUM CHLORIDE 0.9 % IV SOLN
INTRAVENOUS | Status: DC
  Administered 2013-05-17 – 2013-05-21 (×7): via INTRAVENOUS

## 2013-05-16 NOTE — ED Notes (Signed)
Per lab CBC needs to be recollected, QNS

## 2013-05-16 NOTE — ED Notes (Signed)
Pt states that she has been "sick for 6 wks".  States that she has been trying to throw up but she cannot.  Also c/o diarrhea.

## 2013-05-16 NOTE — ED Notes (Signed)
CG4 Lactic Acid given to Dr. Alvino Chapel.

## 2013-05-16 NOTE — H&P (Signed)
History and Physical       Hospital Admission Note Date: 05/16/2013  Patient name: [name] Medical record number: [id number] Date of birth: 06-21-1925 Age: 80 y.o. Gender: female  PCP: Andreas Blower, MD    Chief Complaint:  Intractable diarrhea  HPI:  Patient is 80 year old female with history of CAD, hypertension, rectal cancer and CHF, who was recently discharged on 05/03/2013 with pancolitis presented with intractable diarrhea for last 6-7 weeks. Patient reports that her diarrhea has not improved and now she's having nausea but no vomiting. She denies any abdominal pain. She was recently diagnosed with pancolitis by CT scan. C. difficile was negative. Also reports loss of appetite and generalized weakness.   Review of Systems:  Constitutional: Denies fever, chills, diaphoresis, poor appetite and fatigue.  HEENT: Denies photophobia, eye pain, redness, hearing loss, ear pain, congestion, sore throat, rhinorrhea, sneezing, mouth sores, trouble swallowing, neck pain, neck stiffness and tinnitus.   Respiratory: Denies SOB, DOE, cough, chest tightness,  and wheezing.   Cardiovascular: Denies chest pain, palpitations and leg swelling.  Gastrointestinal: please see history of present illness  Genitourinary: Denies dysuria, urgency, frequency, hematuria, flank pain and difficulty urinating.  Musculoskeletal: Denies myalgias, back pain, joint swelling, arthralgias and gait problem.  Skin: Denies pallor, rash and wound.  Neurological: Denies dizziness, seizures, syncope, light-headedness, numbness and headaches.+ generalized weakness  Hematological: Denies adenopathy. Easy bruising, personal or family bleeding history  Psychiatric/Behavioral: Denies suicidal ideation, mood changes, confusion, nervousness, sleep disturbance and agitation  Past Medical History: Past Medical History  Diagnosis Date  . CAD (coronary artery  disease)     2 vessel intervention 2003  . Dyslipidemia   . CHF (congestive heart failure)     single episode  . Mitral regurgitation     mild prolapse anterior & posterior leaflets  . Aortic valve sclerosis     mild moderate calcification, echo, 2009  . HTN (hypertension)     difficult to obtain BP at times.  [name] Kitchen Urethral trauma     bleeding in hospital  . Rheumatoid arthritis(714.0)     severe - deforming  . Lung disease, interstitial     related to rheumatoid arthritis, also question  of left apical nodule...followed elsewhere..my understanding stabilized  . Carotid artery disease     doppler 12/25/2010,  0-39% bilateral  . Ejection fraction     EF 60%, echo, 11/2007  . Clot     ??? apical clot in the past ?? no longer an issue  . Drug therapy     Prednisone.  . Depression   . GERD (gastroesophageal reflux disease)   . Cataract   . Aortic stenosis     Moderately severe, echo, December, 2013  . Rectal bleed 04/06/2012  . Anemia   . 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 Cancer     rectal  . Edema     May, 2014  . History of radiation therapy 05/11/12-05/19/12    pelvis/inguinal lymphnodes/only able to complete 6 fx  . S/P radiation therapy 12/11/2012-12/21/2012    Rectum and anus / 12.5 Gy at 2.5 Gy per fraction x 5 fractions delivered every other day  . Degeneration macular    Past Surgical History  Procedure Laterality Date  . Appendectomy    . Breast surgery    . Coronary angioplasty with stent placement  2003  . Colonoscopy N/A 04/08/2012    Procedure: COLONOSCOPY;  Surgeon: Winfield Cunas.,  MD;  Location: [facility];  Service: Endoscopy;  Laterality: N/A;  . Lymph node biopsy  04/12/12    right inguinal lymph node-squamous cell carcinoma  . Tonsillectomy      Medications: Prior to Admission medications   Medication Sig Start Date End Date Taking? Authorizing Provider  acetaminophen (TYLENOL) 500 MG tablet Take 1,000 mg by mouth  every 6 (six) hours as needed for pain.   Yes Historical Provider, MD  amitriptyline (ELAVIL) 50 MG tablet Take 50 mg by mouth at bedtime.   Yes Historical Provider, MD  aspirin-acetaminophen-caffeine (EXCEDRIN EXTRA STRENGTH) [phone] MG per tablet Take 2 tablets by mouth every 6 (six) hours as needed for pain.    Yes Historical Provider, MD  Ensure Plus (ENSURE PLUS) LIQD Take 237 mLs by mouth 2 (two) times daily as needed (for suboptimal po intake.).   Yes Historical Provider, MD  esomeprazole (NEXIUM) 40 MG capsule Take 1 capsule (40 mg total) by mouth daily before breakfast. 06/16/12  Yes Carlena Bjornstad, MD  hydroxypropyl methylcellulose (ISOPTO TEARS) 2.5 % ophthalmic solution Place 1 drop into both eyes 3 (three) times daily as needed for dry eyes.   Yes Historical Provider, MD  leflunomide (ARAVA) 20 MG tablet Take 20 mg by mouth daily.    Yes Historical Provider, MD  loperamide (IMODIUM) 2 MG capsule Take 2 mg by mouth 4 (four) times daily as needed for diarrhea or loose stools.   Yes Historical Provider, MD  metoprolol succinate (TOPROL-XL) 100 MG 24 hr tablet Take 1 tablet (100 mg total) by mouth every evening. Take with or immediately following a meal. 03/27/13  Yes Carlena Bjornstad, MD  Multiple Vitamins-Minerals (PRESERVISION AREDS PO) Take 1 capsule by mouth daily.   Yes Historical Provider, MD  potassium chloride SA (K-DUR,KLOR-CON) 20 MEQ tablet Take 2 tablets (40 mEq total) by mouth daily. 05/03/13  Yes Nishant Dhungel, MD  predniSONE (DELTASONE) 5 MG tablet Take 5 mg by mouth daily.     Yes Historical Provider, MD    Allergies:   Allergies  Allergen Reactions  . Ativan [Lorazepam]     Confused and out of it    Social History:  reports that she has never smoked. She has never used smokeless tobacco. She reports that she does not drink alcohol or use illicit drugs.  Family History: Family History  Problem Relation Age of Onset  . Heart disease Mother   . Heart disease Father      Physical Exam: Blood pressure 104/90, pulse 126, temperature 99 F (37.2 C), temperature source Oral, resp. rate 24, SpO2 96.00%. General: Alert, awake, oriented x3, in no acute distress, frail, cachetic. HEENT: normocephalic, atraumatic, anicteric sclera, pink conjunctiva, pupils equal and reactive to light and accomodation, oropharynx clear, dry mucosal membranes Neck: supple, no masses or lymphadenopathy, no goiter, no bruits  Heart: Regular rate and rhythm, without murmurs, rubs or gallops. Lungs: Clear to auscultation bilaterally, no wheezing, rales or rhonchi. Abdomen: Soft, nontender, nondistended, positive bowel sounds, no masses. Extremities: No clubbing, cyanosis or edema with positive pedal pulses. Neuro: Grossly intact, no focal neurological deficits, strength 5/5 upper and lower extremities bilaterally Psych: alert and oriented x 3, normal mood and affect Skin: no rashes or lesions, warm and dry   LABS on Admission:  Basic Metabolic Panel:  Recent Labs Lab 05/15/13 0908 05/16/13 2000  NA 142 138  K 3.4* 3.8  CL  --  97  CO2 27 23  GLUCOSE 94 106*  BUN 10.0 11  CREATININE 0.6 0.41*  CALCIUM 8.9 9.5   Liver Function Tests:  Recent Labs Lab 05/16/13 2000  AST 29  ALT 8  ALKPHOS 99  BILITOT 0.3  PROT 7.1  ALBUMIN 3.4*    Recent Labs Lab 05/16/13 2000  LIPASE 62*   No results found for this basename: AMMONIA,  in the last 168 hours CBC:  Recent Labs Lab 05/16/13 2120  WBC 5.2  NEUTROABS 3.8  HGB 10.5*  HCT 33.2*  MCV 98.8  PLT 192   Cardiac Enzymes: No results found for this basename: CKTOTAL, CKMB, CKMBINDEX, TROPONINI,  in the last 168 hours BNP: No components found with this basename: POCBNP,  CBG: No results found for this basename: GLUCAP,  in the last 168 hours   Radiological Exams on Admission: Ct Abdomen Pelvis W Contrast  04/29/2013   CLINICAL DATA:  80 year old female with abdominal and pelvic pain and diarrhea.  EXAM:  CT ABDOMEN AND PELVIS WITH CONTRAST  TECHNIQUE: Multidetector CT imaging of the abdomen and pelvis was performed using the standard protocol following bolus administration of intravenous contrast.  CONTRAST:  76mL OMNIPAQUE IOHEXOL 300 MG/ML SOLN, 166mL OMNIPAQUE IOHEXOL 300 MG/ML SOLN  COMPARISON:  02/02/2012 and prior CTs  FINDINGS: Circumferential wall thickening of the entire colon is compatible with a colitis -likely pseudomembranous.  There is no evidence of bowel obstruction, pneumoperitoneum or abscess.  A very large hiatal hernia is noted with the the majority of the stomach located intrathoracic.  A 1.8 cm gallstone is again noted.  Intra hepatic and extrahepatic biliary dilatation noted with the CBD measuring 12 mm in greatest diameter. No obstructing causes identified.  No focal hepatic lesions are present.  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  Right inguinal and pelvic sidewall adenopathy is again noted, slightly decreased in size from the last study.  There is no evidence abdominal aortic aneurysm.  No acute or suspicious bony abnormalities are present.  IMPRESSION: Pancolonic wall thickening compatible with colitis - likely infectious/pseudomembranous. No evidence of bowel obstruction, pneumoperitoneum or abscess.  Intra and extrahepatic biliary dilatation with CBD measuring up to 12 mm. No obstructing cause identified and consider further evaluation with ERCP/ MRCP.  1.8 cm gallstone.  Very large hiatal hernia with intrathoracic stomach -unchanged.  Decreased right inguinal and pelvic sidewall adenopathy from 2013.   Electronically Signed   By: Hassan Rowan M.D.   On: 04/29/2013 18:22    Assessment/Plan Principal Problem:   Diarrhea due recent pan colitis  - Patient has been recently treated with ciprofloxacin and Flagyl with no significant improvement in her symptoms. She is somewhat dehydrated, place on gentle hydration,  antiemetics, clear liquid diet.  - Repeat CT of the abdomen and pelvis, placed on IV Zosyn, obtain C. difficile PCR and GI pathogen panel - Will give 2 doses of cholestyramine. She has been on Imodium with no improvement in her symptoms. Hold off on PPI.  Active Problems:    Dehydration - Placed on gentle hydration, she has a history of CHF    Protein-calorie malnutrition, severe - Placed on nutrition supplements    Nausea & vomiting - Placed on IV fluids and antiemetics   History of rectal cancer - Status post radiation treatment, consider palliative care consult for goals of care  DVT prophylaxis:  Lovenox   CODE STATUS:  full code   Family Communication: Admission, patients condition and plan of care including tests  being ordered have been discussed with the patient and  family members who indicates understanding and agree with the plan and Code Status   Further plan will depend as patient's clinical course evolves and further radiologic and laboratory data become available.   Time Spent on Admission: 1 hour  Ripudeep Krystal Eaton M.D. [facility] 05/16/2013, 10:15 PM Pager: [phone]  If 7PM-7AM, please contact night-coverage [email or web address] Password [email or web address]

## 2013-05-16 NOTE — ED Provider Notes (Signed)
Medical screening examination/treatment/procedure(s) were conducted as a shared visit with non-physician practitioner(s) and myself.  I personally evaluated the patient during the encounter.   EKG Interpretation None      Pt is a 80 y.o. F with history of CHF, CAD, rectal cancer who presents emergency room at 7 weeks of diarrhea, nausea and abdominal pain. Patient was diagnosed with colitis recently by CT scan but had negative stool cultures and C. difficile. She has finished a course of Cipro and Flagyl without any improvement of her symptoms. In the emergency department she is tachycardic and appears dry and uncomfortable. She is diffusely tender across her abdomen but her abdomen is non-peritoneal. Her lactate is mildly elevated. Electrolytes within normal levels.  Patient will need admission for continued symptoms and hydration.  [place], DO 05/16/13 2147

## 2013-05-16 NOTE — ED Provider Notes (Signed)
CSN: [id number]     Arrival date & time 05/16/13  1908 History   First MD Initiated Contact with Patient 05/16/13 1956     Chief Complaint  Patient presents with  . Cancer  . Nausea  . Emesis     (Consider location/radiation/quality/duration/timing/severity/associated sxs/prior Treatment) HPI Comments: Patient presents to the emergency department with chief complaint of diarrhea. She has a history of rectal cancer, CHF, CAD, as well as a host of other medical problems. She lives with her son, who states that the patient has become progressively weaker. She was recently admitted for diarrhea and failure to thrive approximately 3 weeks ago. She was discharged to home, however the diarrhea never resolved. She denies any chest pain, or abdominal pain. Denies any hematuria, melena, or hematemesis. She does report feeling nauseated. There no aggravating or alleviating factors. She has tried taking Imodium with no relief.  The history is provided by the patient. No language interpreter was used.    Past Medical History  Diagnosis Date  . CAD (coronary artery disease)     2 vessel intervention 2003  . Dyslipidemia   . CHF (congestive heart failure)     single episode  . Mitral regurgitation     mild prolapse anterior & posterior leaflets  . Aortic valve sclerosis     mild moderate calcification, echo, 2009  . HTN (hypertension)     difficult to obtain BP at times.  [name] Kitchen Urethral trauma     bleeding in hospital  . Rheumatoid arthritis(714.0)     severe - deforming  . Lung disease, interstitial     related to rheumatoid arthritis, also question  of left apical nodule...followed elsewhere..my understanding stabilized  . Carotid artery disease     doppler 12/25/2010,  0-39% bilateral  . Ejection fraction     EF 60%, echo, 11/2007  . Clot     ??? apical clot in the past ?? no longer an issue  . Drug therapy     Prednisone.  . Depression   . GERD (gastroesophageal reflux disease)   .  Cataract   . Aortic stenosis     Moderately severe, echo, December, 2013  . Rectal bleed 04/06/2012  . Anemia   . 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 Cancer     rectal  . Edema     May, 2014  . History of radiation therapy 05/11/12-05/19/12    pelvis/inguinal lymphnodes/only able to complete 6 fx  . S/P radiation therapy 12/11/2012-12/21/2012    Rectum and anus / 12.5 Gy at 2.5 Gy per fraction x 5 fractions delivered every other day  . Degeneration macular    Past Surgical History  Procedure Laterality Date  . Appendectomy    . Breast surgery    . Coronary angioplasty with stent placement  2003  . Colonoscopy N/A 04/08/2012    Procedure: COLONOSCOPY;  Surgeon: Winfield Cunas., MD;  Location: [facility] ENDOSCOPY;  Service: Endoscopy;  Laterality: N/A;  . Lymph node biopsy  04/12/12    right inguinal lymph node-squamous cell carcinoma  . Tonsillectomy     Family History  Problem Relation Age of Onset  . Heart disease Mother   . Heart disease Father    History  Substance Use Topics  . Smoking status: Never Smoker   . Smokeless tobacco: Never Used  . Alcohol Use: No   OB History   Grav Para Term Preterm Abortions TAB SAB Ect  Mult Living                 Review of Systems  Constitutional: Negative for fever and chills.  Respiratory: Negative for shortness of breath.   Cardiovascular: Negative for chest pain.  Gastrointestinal: Positive for diarrhea. Negative for nausea, vomiting and constipation.  Genitourinary: Negative for dysuria.      Allergies  Ativan  Home Medications   Current Outpatient Rx  Name  Route  Sig  Dispense  Refill  . acetaminophen (TYLENOL) 500 MG tablet   Oral   Take 1,000 mg by mouth every 6 (six) hours as needed for pain.         [name] Kitchen amitriptyline (ELAVIL) 50 MG tablet   Oral   Take 50 mg by mouth at bedtime.         [name] Kitchen aspirin-acetaminophen-caffeine (EXCEDRIN EXTRA STRENGTH) 250-250-65 MG per tablet   Oral   Take  2 tablets by mouth every 6 (six) hours as needed for pain.          . Ensure Plus (ENSURE PLUS) LIQD   Oral   Take 237 mLs by mouth 2 (two) times daily as needed (for suboptimal po intake.).         [name] Kitchen esomeprazole (NEXIUM) 40 MG capsule   Oral   Take 1 capsule (40 mg total) by mouth daily before breakfast.   30 capsule   2     Dispense as written.    Please keep upcoming appointment.   . hydroxypropyl methylcellulose (ISOPTO TEARS) 2.5 % ophthalmic solution   Both Eyes   Place 1 drop into both eyes 3 (three) times daily as needed for dry eyes.         [name] Kitchen leflunomide (ARAVA) 20 MG tablet   Oral   Take 20 mg by mouth daily.          [name] Kitchen loperamide (IMODIUM) 2 MG capsule   Oral   Take 2 mg by mouth 4 (four) times daily as needed for diarrhea or loose stools.         . metoprolol succinate (TOPROL-XL) 100 MG 24 hr tablet   Oral   Take 1 tablet (100 mg total) by mouth every evening. Take with or immediately following a meal.   30 tablet   3     .[name] KitchenPatient needs to contact office to schedule  App ...   . Multiple Vitamins-Minerals (PRESERVISION AREDS PO)   Oral   Take 1 capsule by mouth daily.         . potassium chloride SA (K-DUR,KLOR-CON) 20 MEQ tablet   Oral   Take 2 tablets (40 mEq total) by mouth daily.   10 tablet   0   . predniSONE (DELTASONE) 5 MG tablet   Oral   Take 5 mg by mouth daily.            BP 104/90  Pulse 126  Temp(Src) 99 F (37.2 C) (Oral)  Resp 24  SpO2 96% Physical Exam  Nursing note and vitals reviewed. Constitutional: She is oriented to person, place, and time. She appears well-developed and well-nourished.  HENT:  Head: Normocephalic and atraumatic.  Eyes: Conjunctivae and EOM are normal. Pupils are equal, round, and reactive to light.  Neck: Normal range of motion. Neck supple.  Cardiovascular: Normal rate and regular rhythm.  Exam reveals no gallop and no friction rub.   No murmur heard. Pulmonary/Chest: Effort normal  and breath sounds normal. No respiratory distress. She has  no wheezes. She has no rales. She exhibits no tenderness.  Abdominal: Soft. Bowel sounds are normal. She exhibits no distension and no mass. There is no tenderness. There is no rebound and no guarding.  Musculoskeletal: Normal range of motion. She exhibits no edema and no tenderness.  Neurological: She is alert and oriented to person, place, and time.  Skin: Skin is warm and dry.  Psychiatric: She has a normal mood and affect. Her behavior is normal. Judgment and thought content normal.    ED Course  Procedures (including critical care time) Results for orders placed during the hospital encounter of 05/16/13  COMPREHENSIVE METABOLIC PANEL      Result Value Ref Range   Sodium 138  137 - 147 mEq/L   Potassium 3.8  3.7 - 5.3 mEq/L   Chloride 97  96 - 112 mEq/L   CO2 23  19 - 32 mEq/L   Glucose, Bld 106 (*) 70 - 99 mg/dL   BUN 11  6 - 23 mg/dL   Creatinine, Ser 0.41 (*) 0.50 - 1.10 mg/dL   Calcium 9.5  8.4 - 10.5 mg/dL   Total Protein 7.1  6.0 - 8.3 g/dL   Albumin 3.4 (*) 3.5 - 5.2 g/dL   AST 29  0 - 37 U/L   ALT 8  0 - 35 U/L   Alkaline Phosphatase 99  39 - 117 U/L   Total Bilirubin 0.3  0.3 - 1.2 mg/dL   GFR calc non Af Amer >90  >90 mL/min   GFR calc Af Amer >90  >90 mL/min  LIPASE, BLOOD      Result Value Ref Range   Lipase 62 (*) 11 - 59 U/L  URINALYSIS, ROUTINE W REFLEX MICROSCOPIC      Result Value Ref Range   Color, Urine AMBER (*) YELLOW   APPearance CLEAR  CLEAR   Specific Gravity, Urine 1.027  1.005 - 1.030   pH 5.0  5.0 - 8.0   Glucose, UA NEGATIVE  NEGATIVE mg/dL   Hgb urine dipstick NEGATIVE  NEGATIVE   Bilirubin Urine NEGATIVE  NEGATIVE   Ketones, ur NEGATIVE  NEGATIVE mg/dL   Protein, ur 30 (*) NEGATIVE mg/dL   Urobilinogen, UA 0.2  0.0 - 1.0 mg/dL   Nitrite NEGATIVE  NEGATIVE   Leukocytes, UA NEGATIVE  NEGATIVE  CBC WITH DIFFERENTIAL      Result Value Ref Range   WBC 5.2  4.0 - 10.5 K/uL   RBC  3.36 (*) 3.87 - 5.11 MIL/uL   Hemoglobin 10.5 (*) 12.0 - 15.0 g/dL   HCT 33.2 (*) 36.0 - 46.0 %   MCV 98.8  78.0 - 100.0 fL   MCH 31.3  26.0 - 34.0 pg   MCHC 31.6  30.0 - 36.0 g/dL   RDW 16.2 (*) 11.5 - 15.5 %   Platelets 192  150 - 400 K/uL   Neutrophils Relative % 72  43 - 77 %   Neutro Abs 3.8  1.7 - 7.7 K/uL   Lymphocytes Relative 11 (*) 12 - 46 %   Lymphs Abs 0.6 (*) 0.7 - 4.0 K/uL   Monocytes Relative 15 (*) 3 - 12 %   Monocytes Absolute 0.8  0.1 - 1.0 K/uL   Eosinophils Relative 2  0 - 5 %   Eosinophils Absolute 0.1  0.0 - 0.7 K/uL   Basophils Relative 0  0 - 1 %   Basophils Absolute 0.0  0.0 - 0.1 K/uL  URINE MICROSCOPIC-ADD ON  Result Value Ref Range   Squamous Epithelial / LPF RARE  RARE   WBC, UA 0-2  <3 WBC/hpf   Urine-Other AMORPHOUS URATES/PHOSPHATES    I-STAT TROPOININ, ED      Result Value Ref Range   Troponin i, poc 0.01  0.00 - 0.08 ng/mL   Comment 3           I-STAT CG4 LACTIC ACID, ED      Result Value Ref Range   Lactic Acid, Venous 3.42 (*) 0.5 - 2.2 mmol/L   Ct Abdomen Pelvis W Contrast  04/29/2013   CLINICAL DATA:  80 year old female with abdominal and pelvic pain and diarrhea.  EXAM: CT ABDOMEN AND PELVIS WITH CONTRAST  TECHNIQUE: Multidetector CT imaging of the abdomen and pelvis was performed using the standard protocol following bolus administration of intravenous contrast.  CONTRAST:  72mL OMNIPAQUE IOHEXOL 300 MG/ML SOLN, 139mL OMNIPAQUE IOHEXOL 300 MG/ML SOLN  COMPARISON:  02/02/2012 and prior CTs  FINDINGS: Circumferential wall thickening of the entire colon is compatible with a colitis -likely pseudomembranous.  There is no evidence of bowel obstruction, pneumoperitoneum or abscess.  A very large hiatal hernia is noted with the the majority of the stomach located intrathoracic.  A 1.8 cm gallstone is again noted.  Intra hepatic and extrahepatic biliary dilatation noted with the CBD measuring 12 mm in greatest diameter. No obstructing causes  identified.  No focal hepatic lesions are present.  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  Right inguinal and pelvic sidewall adenopathy is again noted, slightly decreased in size from the last study.  There is no evidence abdominal aortic aneurysm.  No acute or suspicious bony abnormalities are present.  IMPRESSION: Pancolonic wall thickening compatible with colitis - likely infectious/pseudomembranous. No evidence of bowel obstruction, pneumoperitoneum or abscess.  Intra and extrahepatic biliary dilatation with CBD measuring up to 12 mm. No obstructing cause identified and consider further evaluation with ERCP/ MRCP.  1.8 cm gallstone.  Very large hiatal hernia with intrathoracic stomach -unchanged.  Decreased right inguinal and pelvic sidewall adenopathy from 2013.   Electronically Signed   By: Hassan Rowan M.D.   On: 04/29/2013 18:22      EKG Interpretation None      MDM   Final diagnoses:  None    Patient with history of rectal cancer.  Has had diarrhea for the past 6 weeks.  Failure to thrive.  Dehydrated.  Patient discussed with Dr. Leonides Schanz, who will see the patient personally.  Anticipate admission.  9:58 PM Patient seen by and discussed with Dr. Leonides Schanz, who recommends admission.  Patient discussed with hospitalist team.  Will admit to stepdown.    Montine Circle, PA-C 05/16/13 2159

## 2013-05-17 ENCOUNTER — Encounter (HOSPITAL_COMMUNITY): Payer: Self-pay

## 2013-05-17 ENCOUNTER — Inpatient Hospital Stay (HOSPITAL_COMMUNITY): Payer: Medicare Other

## 2013-05-17 DIAGNOSIS — I359 Nonrheumatic aortic valve disorder, unspecified: Secondary | ICD-10-CM

## 2013-05-17 DIAGNOSIS — A419 Sepsis, unspecified organism: Secondary | ICD-10-CM | POA: Diagnosis present

## 2013-05-17 DIAGNOSIS — C210 Malignant neoplasm of anus, unspecified: Secondary | ICD-10-CM

## 2013-05-17 LAB — AMMONIA: Ammonia: 14 umol/L (ref 11–60)

## 2013-05-17 LAB — CBC
HCT: 26.7 % — ABNORMAL LOW (ref 36.0–46.0)
Hemoglobin: 8.5 g/dL — ABNORMAL LOW (ref 12.0–15.0)
MCH: 31.5 pg (ref 26.0–34.0)
MCHC: 31.8 g/dL (ref 30.0–36.0)
MCV: 98.9 fL (ref 78.0–100.0)
PLATELETS: 188 10*3/uL (ref 150–400)
RBC: 2.7 MIL/uL — AB (ref 3.87–5.11)
RDW: 16.3 % — ABNORMAL HIGH (ref 11.5–15.5)
WBC: 4.9 10*3/uL (ref 4.0–10.5)

## 2013-05-17 LAB — BASIC METABOLIC PANEL
BUN: 8 mg/dL (ref 6–23)
CALCIUM: 8.1 mg/dL — AB (ref 8.4–10.5)
CO2: 24 mEq/L (ref 19–32)
Chloride: 100 mEq/L (ref 96–112)
Creatinine, Ser: 0.44 mg/dL — ABNORMAL LOW (ref 0.50–1.10)
GFR, EST NON AFRICAN AMERICAN: 89 mL/min — AB (ref >90)
Glucose, Bld: 88 mg/dL (ref 70–99)
Potassium: 3.2 mEq/L — ABNORMAL LOW (ref 3.7–5.3)
SODIUM: 137 meq/L (ref 137–147)

## 2013-05-17 MED ORDER — HYDROCODONE-ACETAMINOPHEN 5-325 MG PO TABS: 1.0000 | ORAL_TABLET | Freq: Four times a day (QID) | ORAL | Status: DC | PRN

## 2013-05-17 MED ORDER — LEFLUNOMIDE 20 MG PO TABS
20.0000 mg | ORAL_TABLET | Freq: Every day | ORAL | Status: DC
  Administered 2013-05-18 – 2013-05-29 (×12): 20 mg via ORAL
  Filled 2013-05-17 (×14): qty 1

## 2013-05-17 MED ORDER — ENSURE COMPLETE PO LIQD: 237.0000 mL | Freq: Two times a day (BID) | ORAL | Status: DC

## 2013-05-17 MED ORDER — PIPERACILLIN-TAZOBACTAM 3.375 G IVPB
3.3750 g | Freq: Three times a day (TID) | INTRAVENOUS | Status: DC
  Administered 2013-05-17 – 2013-05-23 (×21): 3.375 g via INTRAVENOUS
  Filled 2013-05-17 (×22): qty 50

## 2013-05-17 MED ORDER — ENOXAPARIN SODIUM 30 MG/0.3ML ~~LOC~~ SOLN
30.0000 mg | SUBCUTANEOUS | Status: DC
  Administered 2013-05-17 – 2013-05-28 (×12): 30 mg via SUBCUTANEOUS
  Filled 2013-05-17 (×13): qty 0.3

## 2013-05-17 MED ORDER — ACETAMINOPHEN 650 MG RE SUPP: 650.0000 mg | Freq: Four times a day (QID) | RECTAL | Status: DC | PRN

## 2013-05-17 MED ORDER — HYDROMORPHONE HCL PF 1 MG/ML IJ SOLN: 1.0000 mg | INTRAMUSCULAR | Status: DC | PRN

## 2013-05-17 MED ORDER — SACCHAROMYCES BOULARDII 250 MG PO CAPS
250.0000 mg | ORAL_CAPSULE | Freq: Two times a day (BID) | ORAL | Status: DC
  Administered 2013-05-17 – 2013-05-29 (×25): 250 mg via ORAL
  Filled 2013-05-17 (×27): qty 1

## 2013-05-17 MED ORDER — SODIUM CHLORIDE 0.9 % IV SOLN
INTRAVENOUS | Status: DC
Start: 2013-05-17 — End: 2013-05-17
  Administered 2013-05-17: 02:00:00 via INTRAVENOUS

## 2013-05-17 MED ORDER — POTASSIUM CHLORIDE CRYS ER 20 MEQ PO TBCR
40.0000 meq | EXTENDED_RELEASE_TABLET | Freq: Every day | ORAL | Status: DC
  Administered 2013-05-18 – 2013-05-23 (×6): 40 meq via ORAL
  Filled 2013-05-17 (×8): qty 2

## 2013-05-17 MED ORDER — ONDANSETRON HCL 4 MG PO TABS
4.0000 mg | ORAL_TABLET | Freq: Four times a day (QID) | ORAL | Status: DC | PRN
  Administered 2013-05-26 – 2013-05-29 (×2): 4 mg via ORAL
  Filled 2013-05-17 (×2): qty 1

## 2013-05-17 MED ORDER — HYPROMELLOSE (GONIOSCOPIC) 2.5 % OP SOLN: 1.0000 [drp] | Freq: Three times a day (TID) | OPHTHALMIC | Status: DC | PRN

## 2013-05-17 MED ORDER — AMITRIPTYLINE HCL 50 MG PO TABS
50.0000 mg | ORAL_TABLET | Freq: Every day | ORAL | Status: DC
  Administered 2013-05-17 – 2013-05-28 (×12): 50 mg via ORAL
  Filled 2013-05-17 (×14): qty 1

## 2013-05-17 MED ORDER — IOHEXOL 300 MG/ML  SOLN
80.0000 mL | Freq: Once | INTRAMUSCULAR | Status: AC | PRN
  Administered 2013-05-17: 80 mL via INTRAVENOUS

## 2013-05-17 MED ORDER — METOPROLOL SUCCINATE ER 100 MG PO TB24: 100.0000 mg | ORAL_TABLET | Freq: Every evening | ORAL | Status: DC

## 2013-05-17 MED ORDER — ENOXAPARIN SODIUM 40 MG/0.4ML ~~LOC~~ SOLN
40.0000 mg | SUBCUTANEOUS | Status: DC
  Filled 2013-05-17: qty 0.4

## 2013-05-17 MED ORDER — IOHEXOL 300 MG/ML  SOLN: 25.0000 mL | INTRAMUSCULAR | Status: AC

## 2013-05-17 MED ORDER — CHOLESTYRAMINE 4 G PO PACK
4.0000 g | PACK | Freq: Two times a day (BID) | ORAL | Status: AC
  Administered 2013-05-17: 4 g via ORAL
  Filled 2013-05-17 (×2): qty 1

## 2013-05-17 MED ORDER — PREDNISONE 5 MG PO TABS
5.0000 mg | ORAL_TABLET | Freq: Every day | ORAL | Status: DC
  Administered 2013-05-18 – 2013-05-29 (×12): 5 mg via ORAL
  Filled 2013-05-17 (×13): qty 1

## 2013-05-17 MED ORDER — ONDANSETRON HCL 4 MG/2ML IJ SOLN: 4.0000 mg | Freq: Four times a day (QID) | INTRAMUSCULAR | Status: DC | PRN

## 2013-05-17 MED ORDER — BOOST / RESOURCE BREEZE PO LIQD
1.0000 | Freq: Two times a day (BID) | ORAL | Status: DC
  Administered 2013-05-17 – 2013-05-28 (×20): 1 via ORAL

## 2013-05-17 MED ORDER — METOPROLOL SUCCINATE ER 100 MG PO TB24
100.0000 mg | ORAL_TABLET | Freq: Every evening | ORAL | Status: DC
  Administered 2013-05-17 – 2013-05-28 (×13): 100 mg via ORAL
  Filled 2013-05-17 (×15): qty 1

## 2013-05-17 MED ORDER — ACETAMINOPHEN 325 MG PO TABS
650.0000 mg | ORAL_TABLET | Freq: Four times a day (QID) | ORAL | Status: DC | PRN
  Administered 2013-05-17 – 2013-05-29 (×18): 650 mg via ORAL
  Filled 2013-05-17 (×19): qty 2

## 2013-05-17 MED ORDER — POLYVINYL ALCOHOL 1.4 % OP SOLN
1.0000 [drp] | Freq: Three times a day (TID) | OPHTHALMIC | Status: DC | PRN
  Filled 2013-05-17: qty 15

## 2013-05-17 NOTE — Care Management Note (Addendum)
    Page 1 of 1   05/18/2013     1:57:14 PM   CARE MANAGEMENT NOTE 05/18/2013  Patient:  MEGANN, EASTERWOOD   Account Number:  [id number]  Date Initiated:  05/17/2013  Documentation initiated by:  Dessa Phi  Subjective/Objective Assessment:   80 Y/O F ADMITTED W/DIARRHEA.READMIT-3/22-3/26/15-PANCOLITIS.     Action/Plan:   FROM HOME.HAS PCP,PHARMACY.   Anticipated DC Date:  05/21/2013   Anticipated DC Plan:  [place]  CM consult      Choice offered to / List presented to:             Status of service:  In process, will continue to follow Medicare Important Message given?   (If response is "NO", the following Medicare IM given date fields will be blank) Date Medicare IM given:   Date Additional Medicare IM given:    Discharge Disposition:    Per UR Regulation:  Reviewed for med. necessity/level of care/duration of stay  If discussed at [place] of Stay Meetings, dates discussed:    Comments:  05/18/13 Dabid Godown RN,BSN NCM [phone] AWAIT PT/OT RECOMMENDATIONS.  05/17/13 Abrial Arrighi RN,BSN NCM [phone] WOULD RECOMMEND PT/OT CONS WHEN APPROPRIATE.

## 2013-05-17 NOTE — Progress Notes (Addendum)
[facility] PROGRESS NOTE  TRANICE LADUKE CWC:[id number] DOB: [birth year] DOA: 05/16/2013 PCP: Andreas Blower, MD  Assessment/Plan: Sepsis -present on admission -? Due to recurrent colitis -continue Zosyn, IVf, CT with pancolitis unchanged from 3 weeks ago -Fu blood Cx, negative so far -Recent colitis/Cdiff negative, treated with Cipro/Flagyl then -prognosis appears poor, d/w son, agreeable to DNR now  Diarrhea -chronic issue, ongoing for several months, but acutely worse -recent pancolitis on CT, repeat CT unchanged -suspect multifactorial, ?radiation procto-colitis , following XRT to Anus for CA -Gal bladdder dz s/p Perc tube -?malabsorption -FU GI pathogen panel -Cdiff negative 2 weeks ago Agree with Lucrezia Starch -will ask for GI eval  Dehydration  - continue IVF  Protein-calorie malnutrition, severe  - Rd consult  History of Anal carcinoma  - Status post radiation treatment   Adult failure to thrive in setting of advanced age,severe malnutrition, recurrent hospitalization, chronic diarrhea, Anal CA  DVT prophylaxis: Lovenox   CODE STATUS: DNR Family Communication:d/w son, long discussion about declining status, agreeable to DNR now Dispo: inpatient    Consultants:  Onc  Antibiotics:  zosyn  HPI/Subjective: Still with diarrhea, more awake now, talking  Objective: Filed Vitals:   05/17/13 0900  BP:   Pulse:   Temp: 98.8 F (37.1 C)  Resp:     Intake/Output Summary (Last 24 hours) at 05/17/13 1144 Last data filed at 05/17/13 0700  Gross per 24 hour  Intake 369.17 ml  Output      0 ml  Net 369.17 ml   Filed Weights   05/17/13 0030  Weight: 42.3 kg (93 lb 4.1 oz)    Exam:   General:  Awake, alert, talking and appropriate  Cardiovascular: S1s2/RRR  Respiratory: CTAB  Abdomen: soft, Non tender, BS diminsihed   Musculoskeletal: no edema c/c   Data Reviewed: Basic Metabolic Panel:  Recent Labs Lab 05/15/13 0908  05/16/13 2000 05/17/13 0359  NA 142 138 137  K 3.4* 3.8 3.2*  CL  --  97 100  CO2 27 23 24   GLUCOSE 94 106* 88  BUN 10.0 11 8  CREATININE 0.6 0.41* 0.44*  CALCIUM 8.9 9.5 8.1*   Liver Function Tests:  Recent Labs Lab 05/16/13 2000  AST 29  ALT 8  ALKPHOS 99  BILITOT 0.3  PROT 7.1  ALBUMIN 3.4*    Recent Labs Lab 05/16/13 2000  LIPASE 62*    Recent Labs Lab 05/17/13 0840  AMMONIA 14   CBC:  Recent Labs Lab 05/16/13 2120 05/17/13 0359  WBC 5.2 4.9  NEUTROABS 3.8  --   HGB 10.5* 8.5*  HCT 33.2* 26.7*  MCV 98.8 98.9  PLT 192 188   Cardiac Enzymes: No results found for this basename: CKTOTAL, CKMB, CKMBINDEX, TROPONINI,  in the last 168 hours BNP (last 3 results) No results found for this basename: PROBNP,  in the last 8760 hours CBG: No results found for this basename: GLUCAP,  in the last 168 hours  No results found for this or any previous visit (from the past 240 hour(s)).   Studies: No results found.  Scheduled Meds: . amitriptyline  50 mg Oral QHS  . cholestyramine  4 g Oral BID  . enoxaparin (LOVENOX) injection  30 mg Subcutaneous Q24H  . feeding supplement (ENSURE COMPLETE)  237 mL Oral BID BM  . leflunomide  20 mg Oral Daily  . metoprolol succinate  100 mg Oral QPM  . piperacillin-tazobactam (ZOSYN)  IV  3.375 g Intravenous 3 times per day  .  potassium chloride SA  40 mEq Oral Daily  . predniSONE  5 mg Oral Daily  . saccharomyces boulardii  250 mg Oral BID   Continuous Infusions: . sodium chloride    . sodium chloride 50 mL/hr at 05/17/13 9242   Antibiotics Given (last 72 hours)   Date/Time Action Medication Dose Rate   05/17/13 0138 Given   piperacillin-tazobactam (ZOSYN) IVPB 3.375 g 3.375 g 12.5 mL/hr   05/17/13 0600 Given   piperacillin-tazobactam (ZOSYN) IVPB 3.375 g 3.375 g 12.5 mL/hr      Principal Problem:   Diarrhea Active Problems:   Hiatal hernia   Dehydration   Protein-calorie malnutrition, severe    Pancolitis   Colitis   Nausea & vomiting    Time spent: 78min    [place] [facility] Pager [phone]. If 7PM-7AM, please contact night-coverage at [email or web address], password [facility] 05/17/2013, 11:44 AM  LOS: 1 day

## 2013-05-17 NOTE — Progress Notes (Signed)
ANTIBIOTIC CONSULT NOTE - INITIAL  Pharmacy Consult for zosyn Indication: abdominal infection  Allergies  Allergen Reactions  . Ativan [Lorazepam]     Confused and out of it    Patient Measurements: Height: 5\' 3"  (160 cm) Weight: 93 lb 4.1 oz (42.3 kg) IBW/kg (Calculated) : 52.4 Adjusted Body Weight:   Vital Signs: Temp: 99.5 F (37.5 C) (04/09 0030) Temp src: Oral (04/09 0030) BP: 115/48 mmHg (04/09 0030) Pulse Rate: 114 (04/09 0030) Intake/Output from previous day:   Intake/Output from this shift:    Labs:  Recent Labs  05/15/13 0908 05/16/13 2000 05/16/13 2120  WBC  --   --  5.2  HGB  --   --  10.5*  PLT  --   --  192  CREATININE 0.6 0.41*  --    Estimated Creatinine Clearance: 34.3 ml/min (by C-G formula based on Cr of 0.41). No results found for this basename: Letta Median, VANCORANDOM, [place], GENTPEAK, GENTRANDOM, TOBRATROUGH, TOBRAPEAK, TOBRARND, AMIKACINPEAK, AMIKACINTROU, AMIKACIN,  in the last 72 hours   Microbiology: Recent Results (from the past 720 hour(s))  CLOSTRIDIUM DIFFICILE BY PCR     Status: None   Collection Time    04/29/13  2:29 PM      Result Value Ref Range Status   C difficile by pcr NEGATIVE  NEGATIVE Final   Comment: Performed at [employer] [facility]     Status: None   Collection Time    04/29/13  2:29 PM      Result Value Ref Range Status   Specimen Description STOOL   Final   Special Requests Normal   Final   Culture     Final   Value: NO SALMONELLA, SHIGELLA, CAMPYLOBACTER, YERSINIA, OR E.COLI 0157:H7 ISOLATED     Performed at [facility]   Report Status 05/03/2013 FINAL   Final  TECHNOLOGIST REVIEW     Status: None   Collection Time    05/07/13  9:32 AM      Result Value Ref Range Status   Technologist Review Metas and Myelocytes present   Final    Medical History: Past Medical History  Diagnosis Date  . CAD (coronary artery disease)     2 vessel intervention 2003  .  Dyslipidemia   . CHF (congestive heart failure)     single episode  . Mitral regurgitation     mild prolapse anterior & posterior leaflets  . Aortic valve sclerosis     mild moderate calcification, echo, 2009  . HTN (hypertension)     difficult to obtain BP at times.  [name] Kitchen Urethral trauma     bleeding in hospital  . Rheumatoid arthritis(714.0)     severe - deforming  . Lung disease, interstitial     related to rheumatoid arthritis, also question  of left apical nodule...followed elsewhere..my understanding stabilized  . Carotid artery disease     doppler 12/25/2010,  0-39% bilateral  . Ejection fraction     EF 60%, echo, 11/2007  . Clot     ??? apical clot in the past ?? no longer an issue  . Drug therapy     Prednisone.  . Depression   . GERD (gastroesophageal reflux disease)   . Cataract   . Aortic stenosis     Moderately severe, echo, December, 2013  . Rectal bleed 04/06/2012  . Anemia   . 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 Cancer  rectal  . Edema     May, 2014  . History of radiation therapy 05/11/12-05/19/12    pelvis/inguinal lymphnodes/only able to complete 6 fx  . S/P radiation therapy 12/11/2012-12/21/2012    Rectum and anus / 12.5 Gy at 2.5 Gy per fraction x 5 fractions delivered every other day  . Degeneration macular     Medications:  Anti-infectives   Start     Dose/Rate Route Frequency Ordered Stop   05/17/13 0100  piperacillin-tazobactam (ZOSYN) IVPB 3.375 g     3.375 g 12.5 mL/hr over 240 Minutes Intravenous 3 times per day 05/17/13 0053       Assessment: Patient has been recently treated with ciprofloxacin and Flagyl with no significant improvement in her symptoms MD wishes to change to zosyn.  Patient has reduced renal function.  Goal of Therapy:  Zosyn based on renal function   Plan:  Zosyn 3.375g IV Q8H infused over 4hrs.   Erykah Lippert [employer]. 05/17/2013,1:16 AM

## 2013-05-17 NOTE — Progress Notes (Signed)
[facility] OFFICE PROGRESS NOTE  Andreas Blower, MD Individual [place] [address]  DIAGNOSIS: Diarrhea - Plan: Magnesium - [facility], Basic metabolic panel (Bmet) - [facility], CBC with Differential, Basic metabolic panel (Bmet) - [facility], Magnesium - [facility]  Anal squamous cell carcinoma  Chief Complaint  Patient presents with  . Diarrhea    CURRENT THERAPY:  Observation.  INTERVAL HISTORY: [name] 80 y.o. female a history of anus squamous cell carcinoma presents for followup. She was last seen by me on 02/06/2013. On that visit she appeared to be very frail and her radiation treatments were stopped after only 15 GY radiation therapy which were administered almost 4 months prior to that visit. Patient did have followup radiation oncology upon with Dr. Pablo Ledger worked in October.  She a brief course of radiation focally and tolerated this well overall.  She noted soreness in the area which has now resolved. Today she is accompanied by 1 family member. She  recently discharged on 05/03/2013 following a 4-day hospitalization with pancolitis (C. Difficile negative) presented with intractable diarrhea for last 6-7 weeks.  She finished flagyl and cipro this past Sunday.   She reports taking immodium 4 times a day.  She reports continued arthritis pain in her hands but otherwise feels  better than her prior visit.   She denies palpitations, muscle twitching, chest pain.  She also reports decreased appetite and generalized weakness.   MEDICAL HISTORY: Past Medical History  Diagnosis Date  . CAD (coronary artery disease)     2 vessel intervention 2003  . Dyslipidemia   . CHF (congestive heart failure)     single episode  . Mitral regurgitation     mild prolapse anterior & posterior leaflets  . Aortic valve sclerosis     mild moderate calcification, echo, 2009  . HTN (hypertension)     difficult to obtain BP at times.  [name] Kitchen Urethral trauma     bleeding in  hospital  . Rheumatoid arthritis(714.0)     severe - deforming  . Lung disease, interstitial     related to rheumatoid arthritis, also question  of left apical nodule...followed elsewhere..my understanding stabilized  . Carotid artery disease     doppler 12/25/2010,  0-39% bilateral  . Ejection fraction     EF 60%, echo, 11/2007  . Clot     ??? apical clot in the past ?? no longer an issue  . Drug therapy     Prednisone.  . Depression   . GERD (gastroesophageal reflux disease)   . Cataract   . Aortic stenosis     Moderately severe, echo, December, 2013  . Rectal bleed 04/06/2012  . Anemia   . 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 Cancer     rectal  . Edema     May, 2014  . History of radiation therapy 05/11/12-05/19/12    pelvis/inguinal lymphnodes/only able to complete 6 fx  . S/P radiation therapy 12/11/2012-12/21/2012    Rectum and anus / 12.5 Gy at 2.5 Gy per fraction x 5 fractions delivered every other day  . Degeneration macular    ONCOLOGY HISTORY: Squamous cell carcinoma of the anal canal, states T2 and 2, 3B, with biopsy of primary tumor carried out on 04/09/2012 and core needle biopsies of a right inguinal lymph node on 04/12/2012. CT scan of the chest, abdomen, and pelvis with IV contrast carried out on 04/13/2009 showed a right  inguinal lymph node measuring 4.2 x 3.0 cm. There were right inguinal lymph nodes, the largest of which measured 2.2 x 2.1 cm. If we go back to the time that the CT scan on 03/16/2009, there was concern that the patient had a malignant process, which has now been confirmed. It appears that this patient's cancer of the anal canal goes back at least to early 2011. CT scan of the chest, abdomen, and pelvis with IV contrast carried out on 04/13/2012 now shows pararenal mass lesion measuring 3.9 x 5.1 cm on image 109. The right pelvic sidewall lymph node mass now measures 4.9 x 3.2 cm on image 92 in the right inguinal lymph node  measures 4.8 x 3.0 cm on image 103. These lymph nodes have increased compared with prior previous CT scans from 02/02/2012. The patient received radiation to the pelvis and inguinal lymph nodes under the direction of Dr. Thea Silversmith. She received 15 Gy in 6 fractions over 9 days from 05/11/2012 through 05/19/2012. Radiation treatments needed to be stopped because of GI toxicity and a marked decline in the patient's performance status.  INTERIM HISTORY: has HYPERTENSION; Mitral regurgitation; Carotid artery disease; Drug therapy; Clot; Ejection fraction; Lung disease, interstitial; CHF (congestive heart failure); Dyslipidemia; CAD (coronary artery disease); Rheumatoid arthritis on chronic steroids; Hiatal hernia; Lymphadenopathy, abdominal; Cholelithiasis; Acute cholecystitis; Hypokalemia; Normocytic anemia; Bacteremia due to Escherichia coli; Aortic stenosis; Bright red blood per rectum; Anal squamous cell carcinoma; Cancer; Edema; Anemia, unspecified; Diarrhea; Dehydration; Protein-calorie malnutrition, severe; Pancolitis; Colitis; and Nausea & vomiting on her problem list.    ALLERGIES:  is allergic to ativan.  MEDICATIONS: has a current medication list which includes the following prescription(s): acetaminophen, amitriptyline, aspirin-acetaminophen-caffeine, ensure plus, esomeprazole, hydroxypropyl methylcellulose, leflunomide, loperamide, metoprolol succinate, multiple vitamins-minerals, potassium chloride sa, and prednisone, and the following Facility-Administered Medications: sodium chloride, sodium chloride, acetaminophen, acetaminophen, amitriptyline, cholestyramine, enoxaparin, feeding supplement (ensure complete), hydrocodone-acetaminophen, hydromorphone, leflunomide, metoprolol succinate, ondansetron, ondansetron, piperacillin-tazobactam, polyvinyl alcohol, potassium chloride sa, prednisone, and saccharomyces boulardii.  SURGICAL HISTORY:  Past Surgical History  Procedure Laterality Date  .  Appendectomy    . Breast surgery    . Coronary angioplasty with stent placement  2003  . Colonoscopy N/A 04/08/2012    Procedure: COLONOSCOPY;  Surgeon: Winfield Cunas., MD;  Location: [facility] ENDOSCOPY;  Service: Endoscopy;  Laterality: N/A;  . Lymph node biopsy  04/12/12    right inguinal lymph node-squamous cell carcinoma  . Tonsillectomy      REVIEW OF SYSTEMS:   Constitutional: Denies fevers, chills or abnormal weight loss Eyes: Denies blurriness of vision Ears, nose, mouth, throat, and face: Denies mucositis or sore throat Respiratory: Reports cough but denies dyspnea or wheezes Cardiovascular: Denies palpitation, chest discomfort or lower extremity swelling Gastrointestinal:  Denies nausea, heartburn or change in bowel habits Skin: Denies abnormal skin rashes Lymphatics: Denies new lymphadenopathy or easy bruising Neurological:Denies numbness, tingling or new weaknesses Behavioral/Psych: Mood is stable, no new changes  All other systems were reviewed with the patient and are negative.  PHYSICAL EXAMINATION: ECOG PERFORMANCE STATUS: 2 - Symptomatic, <50% confined to bed  Blood pressure 129/48, pulse 90, temperature 98.1 F (36.7 C), temperature source Oral, resp. rate 18, height 5\' 3"  (1.6 m), weight 96 lb 3.2 oz (43.636 kg).  GENERAL:alert, no distress and comfortable; sitting in wheelchair. Chronically ill appearing.  SKIN: skin color, texture, turgor are normal, no rashes or significant lesions EYES: normal, Conjunctiva are pink and non-injected, sclera clear OROPHARYNX:no exudate, no erythema and  lips, buccal mucosa, and tongue normal  NECK: supple, thyroid normal size, non-tender, without nodularity LYMPH:  no palpable lymphadenopathy in the cervical, axillary or supraclavicular.  R groin adenopathy as noted on prior exams. LUNGS: clear to auscultation and percussion with normal breathing effort HEART: regular rate & rhythm and a systolic ejection murmur grade 2/6 and no  lower extremity edema ABDOMEN:abdomen soft, non-tender and normal bowel sounds Musculoskeletal:no cyanosis of digits and no clubbing; severe deforming rheumatoid arthritis in the hands bilaterally  NEURO: alert & oriented x 3 with fluent speech, no focal motor/sensory deficits   LABORATORY DATA: Results for orders placed in visit on 05/15/13 (from the past 48 hour(s))  BASIC METABOLIC PANEL (YQ65)     Status: Abnormal   Collection Time    05/15/13  9:08 AM      Result Value Ref Range   Sodium 142  136 - 145 mEq/L   Potassium 3.4 (*) 3.5 - 5.1 mEq/L   Chloride 105  98 - 109 mEq/L   CO2 27  22 - 29 mEq/L   Glucose 94  70 - 140 mg/dl   BUN 10.0  7.0 - 26.0 mg/dL   Creatinine 0.6  0.6 - 1.1 mg/dL   Calcium 8.9  8.4 - 10.4 mg/dL   Anion Gap 11  3 - 11 mEq/L    RADIOGRAPHIC STUDIES: CT CHEST, ABDOMEN AND PELVIS WITH CONTRAST 04/13/2012 Technique: Multidetector CT imaging of the chest, abdomen and pelvis was performed following the standard protocol during bolus administration of intravenous contrast.  Contrast: 37mL OMNIPAQUE IOHEXOL 300 MG/ML SOLN  Comparison: CT chest, abdomen and pelvis 02/02/2012.  CT CHEST  Findings: The thyroid gland is heterogeneous and mildly enlarged. No axillary, hilar or mediastinal lymphadenopathy is identified. The patient has a massive hiatal hernia containing the entire stomach. Calcific coronary and aortic atherosclerosis is noted.  Small calcified pleural plaques are noted bilaterally. There is  some right basilar atelectasis. No nodule or mass is identified. No lytic or sclerotic bony lesion is identified. Thoracic spondylosis is noted. Severe degenerative change is present about the right shoulder.  IMPRESSION:  1. Negative for metastatic or acute disease.  2. Very large hiatal hernia.  3. Coronary and aortic atherosclerosis.  CT ABDOMEN AND PELVIS  Findings: Gallstones are seen without evidence of cholecystitis. The liver, biliary tree, adrenal  glands, spleen, pancreas and right kidney appear normal.  The patient has a perianal mass lesion measuring 3.9 x 5.1 cm on image 109. As seen on the prior study, there is a right inguinal lymph node mass measuring 4.8 x 3.0 cm on image 103, increased from 2.5 x 4. cm on the prior study. Right pelvic sidewall lymph node mass measures 4.9 x 3.2 cm on image 92 compared to 2.4 x 4.6 cm.  No other lymphadenopathy is identified. There is no fluid  collection. No lytic or sclerotic lesion is seen.  IMPRESSION:  1. Anal mass consistent with known carcinoma. Metastatic  lymphadenopathy in the right groin and right pelvic sidewall is identified and has worsened since the prior study.  2. Gallstone without evidence of cholecystitis.  ASSESSMENT: 1. Squamous cell carcinoma of the anal canal, stage TII and 2, 3B, with biopsy of the primary tumor carried out on 04/09/2012 and core needle biopsies of a right inguinal lymph node on April 12 2012. Current treatment held secondary to poor functional status, decrease by mouth intake. 2. Intractable diarrhea s/p cipro/flagyl.   PLAN:  --[name] is condition appears  to be concerning for her diarrhea.  She reports persistence in her diarrhea despite completion of her cipro/flagyl.  We recommended to increase her immodium and encouraged adequate hydration.    -- However she remains very frail.   Given her performance status and her indolent disease, treatment decisions for her squamous cell carcinoma of the anal canalwill be continuously weighed measuring the risks versus the benefits.  She will discuss her next steps with the radiation oncologist.    --All questions were answered. The patient knows to call the clinic with any problems, questions or concerns. We can certainly see the patient much sooner if necessary. Patient was provided after visit summary today's visit. Patient will return to clinic in 2-3 months with repeat complete blood count and chemistries.  I  spent 15 minutes counseling the patient face to face. The total time spent in the appointment was 25 minutes.    Concha Norway, MD 05/17/2013 9:00 AM

## 2013-05-17 NOTE — Progress Notes (Signed)
OT Cancellation Note  Patient Details Name: [name] MRN: [id number] DOB: 03-30-1925   Cancelled Treatment:    Reason Eval/Treat Not Completed: Other (comment) Checked with RN: pt not aroused/not ready for OT.  Will check back another day.  Lesle Chris 05/17/2013, 11:34 AM Lesle Chris, OTR/L [phone] 05/17/2013

## 2013-05-18 ENCOUNTER — Telehealth: Payer: Self-pay | Admitting: Internal Medicine

## 2013-05-18 DIAGNOSIS — K5289 Other specified noninfective gastroenteritis and colitis: Secondary | ICD-10-CM

## 2013-05-18 LAB — BASIC METABOLIC PANEL
BUN: 6 mg/dL (ref 6–23)
CHLORIDE: 102 meq/L (ref 96–112)
CO2: 23 mEq/L (ref 19–32)
Calcium: 7.4 mg/dL — ABNORMAL LOW (ref 8.4–10.5)
Creatinine, Ser: 0.42 mg/dL — ABNORMAL LOW (ref 0.50–1.10)
GFR calc non Af Amer: >90 mL/min (ref >90)
GLUCOSE: 96 mg/dL (ref 70–99)
POTASSIUM: 3.1 meq/L — AB (ref 3.7–5.3)
Sodium: 137 mEq/L (ref 137–147)

## 2013-05-18 LAB — CBC
HCT: 25 % — ABNORMAL LOW (ref 36.0–46.0)
Hemoglobin: 8.2 g/dL — ABNORMAL LOW (ref 12.0–15.0)
MCH: 31.9 pg (ref 26.0–34.0)
MCHC: 32.8 g/dL (ref 30.0–36.0)
MCV: 97.3 fL (ref 78.0–100.0)
Platelets: 165 10*3/uL (ref 150–400)
RBC: 2.57 MIL/uL — AB (ref 3.87–5.11)
RDW: 16.3 % — ABNORMAL HIGH (ref 11.5–15.5)
WBC: 5.1 10*3/uL (ref 4.0–10.5)

## 2013-05-18 MED ORDER — CHOLESTYRAMINE LIGHT 4 G PO PACK
4.0000 g | PACK | ORAL | Status: AC
  Administered 2013-05-18 – 2013-05-19 (×4): 4 g via ORAL
  Filled 2013-05-18 (×4): qty 1

## 2013-05-18 NOTE — Progress Notes (Signed)
INITIAL NUTRITION ASSESSMENT  DOCUMENTATION CODES Per approved criteria  -Severe malnutrition in the context of chronic illness -Underweight  Pt meets criteria for severe MALNUTRITION in the context of chronic illness as evidenced by 10% weight loss in 3 month period, PO intake <75% for > one month, severe muscle wasting and subcutaneous fat loss.   INTERVENTION: -Recommend Resource Breeze po BID, each supplement provides 250 kcal and 9 grams of protein -Diet advancement per MD -Will continue to montior  NUTRITION DIAGNOSIS: Inadequate energy intake related to decreased appetite/loose stools as evidenced by PO intake <75%, unintentional wt loss.   Goal: Pt to meet >/= 90% of their estimated nutrition needs    Monitor:  Total protein/energy intake, labs, weights, GI profile  Reason for Assessment: MST  80 y.o. female  Admitting Dx: Diarrhea  ASSESSMENT: -Patient is 80 year old female with history of CAD, hypertension, rectal cancer and CHF, who was recently discharged on 05/03/2013 with pancolitis presented with intractable diarrhea for last 6-7 weeks -Family reported pt with very poor appetite, has been sleeping through or refusing most meals. This has been ongoing for past several weeks -Pt only sipping water during admit, no intake of clear liquid diet -Dislikes Ensure per previous RD assessment -Family in agreement to try [employer] as pt has had that during previous admit and may be more apt to sip on that vs Ensure Complete -MD expressed concern for malabsorption d/t ongoing loose stools. Is C.diff negative. GI evaluation pending -Pt s/p radiation for anal carcinoma -Family endorsed has had an unintentional wt loss of 10 lbs in past 3 months -With evident severe muscle wasting and subcutaneous fat loss in temporal, occipital, clavicle, and acromion region  Height: Ht Readings from Last 1 Encounters:  05/17/13 5\' 3"  (1.6 m)    Weight: Wt Readings from Last 1  Encounters:  05/18/13 95 lb 10.9 oz (43.4 kg)    Ideal Body Weight: 115 lbs  % Ideal Body Weight: 83%  Wt Readings from Last 10 Encounters:  05/18/13 95 lb 10.9 oz (43.4 kg)  05/15/13 96 lb 3.2 oz (43.636 kg)  04/29/13 95 lb 5 oz (43.233 kg)  03/08/13 98 lb 1.6 oz (44.498 kg)  02/09/13 104 lb (47.174 kg)  02/06/13 105 lb 11.2 oz (47.945 kg)  11/16/12 104 lb 3.2 oz (47.265 kg)  11/02/12 104 lb 1.6 oz (47.219 kg)  09/01/12 100 lb 11.2 oz (45.677 kg)  08/03/12 99 lb 1.6 oz (44.951 kg)    Usual Body Weight: 105 lb  % Usual Body Weight: 90%  BMI:  Body mass index is 16.95 kg/(m^2). Underweight  Estimated Nutritional Needs: Kcal: 1500-1700 Protein: 65-80 Fluid: >/= 1500 ml/daily  Skin: + 1 edema in RLE and LLE  Diet Order: Clear Liquid  EDUCATION NEEDS: -No education needs identified at this time   Intake/Output Summary (Last 24 hours) at 05/18/13 0912 Last data filed at 05/18/13 0751  Gross per 24 hour  Intake   1540 ml  Output    275 ml  Net   1265 ml    Last BM: 4/08   Labs:   Recent Labs Lab 05/15/13 0908 05/16/13 2000 05/17/13 0359  NA 142 138 137  K 3.4* 3.8 3.2*  CL  --  97 100  CO2 27 23 24   BUN 10.0 11 8  CREATININE 0.6 0.41* 0.44*  CALCIUM 8.9 9.5 8.1*  GLUCOSE 94 106* 88    CBG (last 3)  No results found for this basename: GLUCAP,  in the last 72 hours  Scheduled Meds: . amitriptyline  50 mg Oral QHS  . enoxaparin (LOVENOX) injection  30 mg Subcutaneous Q24H  . feeding supplement (RESOURCE BREEZE)  1 Container Oral BID BM  . leflunomide  20 mg Oral Daily  . metoprolol succinate  100 mg Oral QPM  . piperacillin-tazobactam (ZOSYN)  IV  3.375 g Intravenous 3 times per day  . potassium chloride SA  40 mEq Oral Daily  . predniSONE  5 mg Oral Daily  . saccharomyces boulardii  250 mg Oral BID    Continuous Infusions: . sodium chloride 75 mL/hr at 05/17/13 1918    Past Medical History  Diagnosis Date  . CAD (coronary artery  disease)     2 vessel intervention 2003  . Dyslipidemia   . CHF (congestive heart failure)     single episode  . Mitral regurgitation     mild prolapse anterior & posterior leaflets  . Aortic valve sclerosis     mild moderate calcification, echo, 2009  . HTN (hypertension)     difficult to obtain BP at times.  [name] Kitchen Urethral trauma     bleeding in hospital  . Rheumatoid arthritis(714.0)     severe - deforming  . Lung disease, interstitial     related to rheumatoid arthritis, also question  of left apical nodule...followed elsewhere..my understanding stabilized  . Carotid artery disease     doppler 12/25/2010,  0-39% bilateral  . Ejection fraction     EF 60%, echo, 11/2007  . Clot     ??? apical clot in the past ?? no longer an issue  . Drug therapy     Prednisone.  . Depression   . GERD (gastroesophageal reflux disease)   . Cataract   . Aortic stenosis     Moderately severe, echo, December, 2013  . Rectal bleed 04/06/2012  . Anemia   . Edema     May, 2014  . History of radiation therapy 05/11/12-05/19/12    pelvis/inguinal lymphnodes/only able to complete 6 fx  . S/P radiation therapy 12/11/2012-12/21/2012    Rectum and anus / 12.5 Gy at 2.5 Gy per fraction x 5 fractions delivered every other day  . Degeneration macular   . 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 Cancer     rectal    Past Surgical History  Procedure Laterality Date  . Appendectomy    . Breast surgery    . Coronary angioplasty with stent placement  2003  . Colonoscopy N/A 04/08/2012    Procedure: COLONOSCOPY;  Surgeon: Winfield Cunas., MD;  Location: [facility] ENDOSCOPY;  Service: Endoscopy;  Laterality: N/A;  . Lymph node biopsy  04/12/12    right inguinal lymph node-squamous cell carcinoma  . Tonsillectomy      Jacobus Nimmons Clinical Dietitian [phone]

## 2013-05-18 NOTE — Clinical Documentation Improvement (Signed)
   Medicare rules require specification as to whether an inpatient diagnosis was present at the time of admission.    Please clarify if the following diagnosis - SEPSIS - was:       Present on Admission    NOT Present on Admission and it developed during the inpatient stay    Unable to clinically determine whether the condition was present on admission.    Documentation insufficient to determine if condition was present at the time of inpatient admission   Thank You,  Erling Conte ,RN BSN CCDS Certified Clinical Documentation Specialist:  [phone]  [place] [facility]

## 2013-05-18 NOTE — Telephone Encounter (Signed)
Talked to pt's husband, pt is hospitalized he requested labs for 4/14 be cancelled and keep the 4/24 aappt for lab and MD appt was moved from am to pm

## 2013-05-18 NOTE — Evaluation (Signed)
Occupational Therapy Evaluation Patient Details Name: [name] MRN: [id number] DOB: [birth year] Today's Date: 05/18/2013    History of Present Illness Pt was admittedd for intractable diarrhea.  she has a h/o cad, chf, htn, and rectal CA   Clinical Impression   Pt was admitted for the above.  She will benefit from skilled OT to increase safety and independence with adls.  Pt was independent with basic adls prior to admission.  Goals in acute are for supervision level.      Follow Up Recommendations  Supervision/Assistance - 24 hour;Home health OT (vs snf, if she doesn't have this)    Equipment Recommendations  None recommended by OT    Recommendations for Other Services       Precautions / Restrictions Precautions Precautions: Fall Restrictions Weight Bearing Restrictions: No      Mobility Bed Mobility Overal bed mobility: Modified Independent             General bed mobility comments: extra time  Transfers Overall transfer level: Needs assistance Equipment used: None Transfers: Sit to/from Stand Sit to Stand: Min assist         General transfer comment: assist to rise and steady    Balance Overall balance assessment: Needs assistance (needs support via bedrail/arm of chair for balance)                                          ADL Overall ADL's : Needs assistance/impaired                 Upper Body Dressing : Minimal assistance;Standing       Toilet Transfer: Minimal assistance;BSC;Stand-pivot   Toileting- Clothing Manipulation and Hygiene: Maximal assistance;Sit to/from stand       Functional mobility during ADLs: Minimal assistance General ADL Comments: SPT to BSC at 180 degrees.  Pt prefers this position.  She is able to perform UB adls with set up (x if IV is connected then min A for dressing).  LB adls are min A for balance, sit to stand     Vision                     Perception     Praxis       Pertinent Vitals/Pain No c/o pain.  Still having diarrhea/urgency.  While sitting on commode, IV tubing became disconnected (IV intact) and blood was coming out.  RN replaced line.     Hand Dominance     Extremity/Trunk Assessment Upper Extremity Assessment Upper Extremity Assessment: Generalized weakness;RUE deficits/detail;LUE deficits/detail RUE Deficits / Details: RA changes in bil UEs. Can lift RUE to approximately 50 degrees LUE Deficits / Details: RA changes in hand; shoulder arom to 90           Communication Communication Communication: HOH   Cognition Arousal/Alertness: Awake/alert Behavior During Therapy: WFL for tasks assessed/performed Overall Cognitive Status: Within Functional Limits for tasks assessed                     General Comments       Exercises       Shoulder Instructions      Home Living Family/patient expects to be discharged to:: Private residence Living Arrangements: Children                 Bathroom Shower/Tub: Tub/shower unit Shower/tub characteristics: Curtain  Bathroom Toilet: Standard     Home Equipment: Bedside commode   Additional Comments: pt doesn't use AD but furniture walks. Sponge bathes at sink      Prior Functioning/Environment Level of Independence: Independent             OT Diagnosis: Generalized weakness   OT Problem List: Decreased strength;Decreased activity tolerance;Impaired balance (sitting and/or standing);Decreased knowledge of use of DME or AE   OT Treatment/Interventions: Self-care/ADL training;Energy conservation;Patient/family education;Balance training    OT Goals(Current goals can be found in the care plan section) Acute Rehab OT Goals Patient Stated Goal: get stronger OT Goal Formulation: With patient Time For Goal Achievement: 06/01/13 Potential to Achieve Goals: Good ADL Goals Pt Will Perform Grooming: standing Pt Will Perform Lower Body Bathing: with supervision;sit  to/from stand Pt Will Perform Lower Body Dressing: with supervision;sit to/from stand Pt Will Transfer to Toilet: with supervision;bedside commode;ambulating Pt Will Perform Toileting - Clothing Manipulation and hygiene: with supervision;sit to/from stand  OT Frequency: Min 2X/week   Barriers to D/C:            Co-evaluation              End of Session    Activity Tolerance: Patient tolerated treatment well Patient left: in chair;with call bell/phone within reach;with chair alarm set;with family/visitor present   Time: 2993-7169 OT Time Calculation (min): 31 min Charges:  OT General Charges $OT Visit: 1 Procedure OT Evaluation $Initial OT Evaluation Tier I: 1 Procedure OT Treatments $Self Care/Home Management : 8-22 mins G-Codes:    Lesle Chris 2013/06/11, 1:58 PM Lesle Chris, OTR/L [phone] 2013-06-11

## 2013-05-18 NOTE — Consult Note (Signed)
[facility] Consultation Note  Referring Provider: Dr. Domenic Polite [facility]) Primary Care Physician:  Andreas Blower, MD  Reason for Consultation:  diarrhea  HPI: [name] is a 80 y.o. female with multiple medical problems, as outlined below, admitted for, and whom we've been asked to see on behalf of, diarrhea.  Patient has history of locally advanced squamous cell anal cancer, diagnosed in March 2014, and for which she's had radiation treatments.  Patient describes having progressive diarrhea with her radiation treatments, especially bad over the past several weeks.  Last radiation treatment was a few weeks ago per patient and her son, but I can't find any records of radiation since November 2014.  Patient was discharged from hospital a couple weeks ago, at which time she was diagnosed with infectious colitis; C. Diff at that time was negative.  Patient describes several bowel movements daily, not associated with eating, with bowel and bladder incontinence.  No abdominal pain or blood in stool.  Has been losing weight.   Past Medical History  Diagnosis Date  . CAD (coronary artery disease)     2 vessel intervention 2003  . Dyslipidemia   . CHF (congestive heart failure)     single episode  . Mitral regurgitation     mild prolapse anterior & posterior leaflets  . Aortic valve sclerosis     mild moderate calcification, echo, 2009  . HTN (hypertension)     difficult to obtain BP at times.  [name] Kitchen Urethral trauma     bleeding in hospital  . Rheumatoid arthritis(714.0)     severe - deforming  . Lung disease, interstitial     related to rheumatoid arthritis, also question  of left apical nodule...followed elsewhere..my understanding stabilized  . Carotid artery disease     doppler 12/25/2010,  0-39% bilateral  . Ejection fraction     EF 60%, echo, 11/2007  . Clot     ??? apical clot in the past ?? no longer an issue  . Drug therapy     Prednisone.  . Depression    . GERD (gastroesophageal reflux disease)   . Cataract   . Aortic stenosis     Moderately severe, echo, December, 2013  . Rectal bleed 04/06/2012  . Anemia   . Edema     May, 2014  . History of radiation therapy 05/11/12-05/19/12    pelvis/inguinal lymphnodes/only able to complete 6 fx  . S/P radiation therapy 12/11/2012-12/21/2012    Rectum and anus / 12.5 Gy at 2.5 Gy per fraction x 5 fractions delivered every other day  . Degeneration macular   . 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 Cancer     rectal    Past Surgical History  Procedure Laterality Date  . Appendectomy    . Breast surgery    . Coronary angioplasty with stent placement  2003  . Colonoscopy N/A 04/08/2012    Procedure: COLONOSCOPY;  Surgeon: Winfield Cunas., MD;  Location: [facility] ENDOSCOPY;  Service: Endoscopy;  Laterality: N/A;  . Lymph node biopsy  04/12/12    right inguinal lymph node-squamous cell carcinoma  . Tonsillectomy      Prior to Admission medications   Medication Sig Start Date End Date Taking? Authorizing Provider  acetaminophen (TYLENOL) 500 MG tablet Take 1,000 mg by mouth every 6 (six) hours as needed for pain.   Yes Historical Provider, MD  amitriptyline (ELAVIL) 50 MG tablet Take 50 mg by mouth  at bedtime.   Yes Historical Provider, MD  aspirin-acetaminophen-caffeine (EXCEDRIN EXTRA STRENGTH) [phone] MG per tablet Take 2 tablets by mouth every 6 (six) hours as needed for pain.    Yes Historical Provider, MD  Ensure Plus (ENSURE PLUS) LIQD Take 237 mLs by mouth 2 (two) times daily as needed (for suboptimal po intake.).   Yes Historical Provider, MD  esomeprazole (NEXIUM) 40 MG capsule Take 1 capsule (40 mg total) by mouth daily before breakfast. 06/16/12  Yes Carlena Bjornstad, MD  hydroxypropyl methylcellulose (ISOPTO TEARS) 2.5 % ophthalmic solution Place 1 drop into both eyes 3 (three) times daily as needed for dry eyes.   Yes Historical Provider, MD  leflunomide (ARAVA) 20  MG tablet Take 20 mg by mouth daily.    Yes Historical Provider, MD  loperamide (IMODIUM) 2 MG capsule Take 2 mg by mouth 4 (four) times daily as needed for diarrhea or loose stools.   Yes Historical Provider, MD  metoprolol succinate (TOPROL-XL) 100 MG 24 hr tablet Take 1 tablet (100 mg total) by mouth every evening. Take with or immediately following a meal. 03/27/13  Yes Carlena Bjornstad, MD  Multiple Vitamins-Minerals (PRESERVISION AREDS PO) Take 1 capsule by mouth daily.   Yes Historical Provider, MD  potassium chloride SA (K-DUR,KLOR-CON) 20 MEQ tablet Take 2 tablets (40 mEq total) by mouth daily. 05/03/13  Yes Nishant Dhungel, MD  predniSONE (DELTASONE) 5 MG tablet Take 5 mg by mouth daily.     Yes Historical Provider, MD    Current Facility-Administered Medications  Medication Dose Route Frequency Provider Last Rate Last Dose  . 0.9 %  sodium chloride infusion   Intravenous Continuous Kristen N Ward, DO 75 mL/hr at 05/18/13 1275    . acetaminophen (TYLENOL) tablet 650 mg  650 mg Oral Q6H PRN Ripudeep Krystal Eaton, MD   650 mg at 05/17/13 2121   Or  . acetaminophen (TYLENOL) suppository 650 mg  650 mg Rectal Q6H PRN Ripudeep K Rai, MD      . amitriptyline (ELAVIL) tablet 50 mg  50 mg Oral QHS Ripudeep Krystal Eaton, MD   50 mg at 05/17/13 2106  . enoxaparin (LOVENOX) injection 30 mg  30 mg Subcutaneous Q24H Randall K Absher, RPH   30 mg at 05/17/13 1848  . feeding supplement (RESOURCE BREEZE) (RESOURCE BREEZE) liquid 1 Container  1 Container Oral BID BM Hazle Coca, RD   1 Container at 05/17/13 1854  . HYDROcodone-acetaminophen (NORCO/VICODIN) 5-325 MG per tablet 1 tablet  1 tablet Oral Q6H PRN Ripudeep K Rai, MD      . HYDROmorphone (DILAUDID) injection 1 mg  1 mg Intravenous Q4H PRN Ripudeep K Rai, MD      . leflunomide (ARAVA) tablet 20 mg  20 mg Oral Daily Ripudeep Krystal Eaton, MD   20 mg at 05/18/13 0915  . metoprolol succinate (TOPROL-XL) 24 hr tablet 100 mg  100 mg Oral QPM Ripudeep Krystal Eaton, MD   100  mg at 05/17/13 1848  . ondansetron (ZOFRAN) tablet 4 mg  4 mg Oral Q6H PRN Ripudeep Krystal Eaton, MD       Or  . ondansetron (ZOFRAN) injection 4 mg  4 mg Intravenous Q6H PRN Ripudeep K Rai, MD      . piperacillin-tazobactam (ZOSYN) IVPB 3.375 g  3.375 g Intravenous 3 times per day Ripudeep Krystal Eaton, MD   3.375 g at 05/18/13 0533  . polyvinyl alcohol (LIQUIFILM TEARS) 1.4 % ophthalmic solution 1 drop  1 drop Both Eyes TID PRN Ripudeep K Rai, MD      . potassium chloride SA (K-DUR,KLOR-CON) CR tablet 40 mEq  40 mEq Oral Daily Ripudeep Krystal Eaton, MD   40 mEq at 05/18/13 0915  . predniSONE (DELTASONE) tablet 5 mg  5 mg Oral Daily Ripudeep Krystal Eaton, MD   5 mg at 05/18/13 0916  . saccharomyces boulardii (FLORASTOR) capsule 250 mg  250 mg Oral BID Ripudeep Krystal Eaton, MD   250 mg at 05/18/13 0916    Allergies as of 05/16/2013 - Review Complete 05/16/2013  Allergen Reaction Noted  . Ativan [lorazepam]  05/25/2012    Family History  Problem Relation Age of Onset  . Heart disease Mother   . Heart disease Father     History   Social History  . Marital Status: Widowed    Spouse Name: N/A    Number of Children: N/A  . Years of Education: N/A   Occupational History  . Retired    Social History Main Topics  . Smoking status: Never Smoker   . Smokeless tobacco: Never Used  . Alcohol Use: No  . Drug Use: No  . Sexual Activity: Not on file   Other Topics Concern  . Not on file   Social History Narrative   Lives with son in [place], [place]. She is retired.    Review of Systems: ROS Dr. Tana Coast 05/16/13 reviewed and I agree  Physical Exam: Vital signs in last 24 hours: Temp:  [97.7 F (36.5 C)-100.2 F (37.9 C)] 98.1 F (36.7 C) (04/10 0534) Pulse Rate:  [87-107] 87 (04/10 0534) Resp:  [18-30] 20 (04/10 0534) BP: (99-127)/(45-58) 127/57 mmHg (04/10 0534) SpO2:  [93 %-96 %] 95 % (04/10 0534) Weight:  [43.4 kg (95 lb 10.9 oz)] 43.4 kg (95 lb 10.9 oz) (04/10 0500) Last BM Date: 05/17/13 General:    Alert, oriented, frail- and cachectic-appearing, but is not acutely toxic-appearing Head:  Normocephalic and atraumatic. Eyes:  Sclera clear, no icterus.   Conjunctiva pink. Ears:  Normal auditory acuity. Nose:  No deformity, discharge,  or lesions. Mouth:  No deformity or lesions.  Oropharynx pale and dry Neck:  Supple; no masses or thyromegaly. Lungs:  Diminished breath sounds at bases.   No wheezes, crackles, or rhonchi. No acute distress. Heart:  Regular rate and rhythm; II/VI SEM LLSB Abdomen:  Soft, nontender and nondistended. No masses, hepatosplenomegaly or hernias noted. Normal bowel sounds, without guarding, and without rebound.     Msk:  Diffuse muscular atrophy, diffuse changes of rheumatoid arthritis, most pronounced in the hands Pulses:  Normal pulses noted. Extremities:  Without clubbing or edema. Neurologic:  Alert and  oriented x4;  Diffusely weak. Skin:  Pale; otherwise intact without significant lesions or rashes. Psych:  Alert and cooperative. Normal mood and affect.   Lab Results:  Recent Labs  05/16/13 2120 05/17/13 0359 05/18/13 0926  WBC 5.2 4.9 5.1  HGB 10.5* 8.5* 8.2*  HCT 33.2* 26.7* 25.0*  PLT 192 188 165   BMET  Recent Labs  05/16/13 2000 05/17/13 0359 05/18/13 0926  NA 138 137 137  K 3.8 3.2* 3.1*  CL 97 100 102  CO2 23 24 23   GLUCOSE 106* 88 96  BUN 11 8 6   CREATININE 0.41* 0.44* 0.42*  CALCIUM 9.5 8.1* 7.4*   LFT  Recent Labs  05/16/13 2000  PROT 7.1  ALBUMIN 3.4*  AST 29  ALT 8  ALKPHOS 99  BILITOT 0.3  PT/INR No results found for this basename: LABPROT, INR,  in the last 72 hours  Studies/Results: Ct Abdomen Pelvis W Contrast  05/17/2013   ADDENDUM REPORT: 05/17/2013 13:42  ADDENDUM: Please add the following to the impression section:  8.  Sludge ball or non calcified gallstone.   Electronically Signed   By: Lorin Picket M.D.   On: 05/17/2013 13:42   05/17/2013   CLINICAL DATA:  Rectal cancer with recent pan colitis  and intractable diarrhea. Persistent diarrhea and nausea. Loss of appetite and generalized weakness.  EXAM: CT ABDOMEN AND PELVIS WITH CONTRAST  TECHNIQUE: Multidetector CT imaging of the abdomen and pelvis was performed using the standard protocol following bolus administration of intravenous contrast.  CONTRAST:  43mL OMNIPAQUE IOHEXOL 300 MG/ML  SOLN  COMPARISON:  CT ABD/PELVIS W CM dated 04/29/2013  FINDINGS: Lung bases show tiny bilateral effusions with compressive atelectasis in both lower lobes. Large hiatal hernia occupies the posterior aspect of the lower right hemi thorax, incompletely imaged. Heart is at the upper limits of normal in size. Mitral annulus and coronary artery calcification. No pericardial effusion.  Liver measures 18.9 cm. Periportal edema and dilatation of the biliary tree, as before. Common bile duct measures up to 11 mm, stable. A 2.0 cm rounded soft tissue lesion is seen in the gallbladder, as before. Adrenal glands are unremarkable. Right renal cortical scarring. A 7 mm low-attenuation lesion in the lower pole left kidney is unchanged and likely a cyst. Spleen is unremarkable. The majority of the stomach is located within the the above described hiatal hernia. Duodenal diverticulum. Small bowel is otherwise unremarkable. There is diffuse wall thickening involving the colon presacral edema has worsened in the interval.  Atherosclerotic calcification of the arterial vasculature without abdominal aortic aneurysm. Abdominal retroperitoneal lymph nodes measure up to 11 mm in the left periaortic station (image 30), stable. Partially calcified right external iliac nodal mass measures 2.7 x 4.4 cm, stable. A heterogeneous mass is seen in the right inguinal region, measuring 3.2 x 4.6 cm, likely stable. Right inguinal hernia contains fat and nonobstructed small bowel.  No worrisome lytic or sclerotic lesions. Degenerative changes are seen in the spine.  IMPRESSION: 1. Diffuse colonic wall  thickening, unchanged and indicative of pan colitis. Associated presacral edema has increased slightly. 2. Tiny bilateral effusions with compressive atelectasis in both lower lobes. 3. Periportal edema and biliary ductal dilatation are unchanged. 4. Heterogeneous and partially calcified nodal masses in the right external iliac chain and right inguinal region, stable. 5. Large hiatal hernia. 6. Hepatomegaly.  Electronically Signed: By: Lorin Picket M.D. On: 05/17/2013 13:31   Impression:  1.  Diarrhea.  Clinically, history most consistent with either radiation colitis or infectious colitis. 2.  Pancolitis on CT.  Unclear whether this represents infection (e.g., C. Diff), inflammation, radiation effects, ischemia or perhaps even artifactual from possible hypoalbuminemia (patient is markedly malnourished, albumin 2.2 a couple weeks ago).   Plan:  1.  Agree with stool studies, including C. Diff PCR and stool culture. 2.  Agree with Florastor 250 mg po bid. 3.  Would go ahead and start cholestyramine 4 g po bid. 4.  If stool studies are unrevealing, and diarrhea persists, would consider sigmoidoscopy as next step in management.    LOS: 2 days   Arta Silence  05/18/2013, 11:15 AM

## 2013-05-19 DIAGNOSIS — C801 Malignant (primary) neoplasm, unspecified: Secondary | ICD-10-CM

## 2013-05-19 LAB — BASIC METABOLIC PANEL
BUN: 3 mg/dL — AB (ref 6–23)
CO2: 21 mEq/L (ref 19–32)
Calcium: 7.7 mg/dL — ABNORMAL LOW (ref 8.4–10.5)
Chloride: 109 mEq/L (ref 96–112)
Creatinine, Ser: 0.4 mg/dL — ABNORMAL LOW (ref 0.50–1.10)
GFR calc Af Amer: >90 mL/min (ref >90)
GFR calc non Af Amer: >90 mL/min (ref >90)
GLUCOSE: 77 mg/dL (ref 70–99)
POTASSIUM: 3.4 meq/L — AB (ref 3.7–5.3)
Sodium: 140 mEq/L (ref 137–147)

## 2013-05-19 LAB — CBC
HEMATOCRIT: 25 % — AB (ref 36.0–46.0)
HEMOGLOBIN: 7.8 g/dL — AB (ref 12.0–15.0)
MCH: 30.6 pg (ref 26.0–34.0)
MCHC: 31.2 g/dL (ref 30.0–36.0)
MCV: 98 fL (ref 78.0–100.0)
Platelets: 166 10*3/uL (ref 150–400)
RBC: 2.55 MIL/uL — ABNORMAL LOW (ref 3.87–5.11)
RDW: 16.3 % — ABNORMAL HIGH (ref 11.5–15.5)
WBC: 3.4 10*3/uL — ABNORMAL LOW (ref 4.0–10.5)

## 2013-05-19 LAB — CLOSTRIDIUM DIFFICILE BY PCR: CDIFFPCR: NEGATIVE

## 2013-05-19 MED ORDER — POTASSIUM CHLORIDE CRYS ER 20 MEQ PO TBCR
40.0000 meq | EXTENDED_RELEASE_TABLET | Freq: Once | ORAL | Status: AC
  Administered 2013-05-19: 40 meq via ORAL
  Filled 2013-05-19: qty 2

## 2013-05-19 NOTE — Progress Notes (Addendum)
[facility] PROGRESS NOTE  [name] DXI:[id number] DOB: [birth year] DOA: 05/16/2013 PCP: Andreas Blower, MD  Assessment/Plan: Sepsis -present on admission -improving, afebrile now, mentation improved -? Due to recurrent colitis -continue Zosyn Day3, IVf, CT with pancolitis unchanged from 3 weeks ago -Fu blood Cx, negative so far -GI pathogen panel pending -Recent colitis/Cdiff negative, treated with Cipro/Flagyl then -prognosis appears poor, d/w son, agreeable to DNR now  Diarrhea -chronic issue, ongoing for several months, but acutely worse -recent pancolitis on CT, repeat CT unchanged -suspect multifactorial, ?radiation procto-colitis , following XRT to Anus for CA -Gal bladdder dz s/p Perc tube -?malabsorption -FU GI pathogen panel -Cdiff negative 2 weeks ago -Continue Questran and Technical sales engineer -Greatly appreciate Dr.Outlaw's assistance -advance to full liquid  Dehydration  - continue IVF  Protein-calorie malnutrition, severe  - Rd consult  History of Anal carcinoma  - Status post radiation treatment   Adult failure to thrive in setting of advanced age,severe malnutrition, recurrent hospitalization, chronic diarrhea, Anal CA  Hypokalemia -due to diarrhea, replace  DVT prophylaxis: Lovenox   CODE STATUS: DNR Family Communication:d/w son, long discussion about declining status, agreeable to DNR now, no family at bedside today Dispo: inpatient    Consultants:  Onc  Antibiotics:  zosyn  HPI/Subjective: Still with diarrhea, otherwise feels ok, drinking some  Objective: Filed Vitals:   05/19/13 0426  BP: 109/55  Pulse: 85  Temp: 97.4 F (36.3 C)  Resp: 20    Intake/Output Summary (Last 24 hours) at 05/19/13 0838 Last data filed at 05/19/13 0729  Gross per 24 hour  Intake   2190 ml  Output    650 ml  Net   1540 ml   Filed Weights   05/17/13 0030 05/18/13 0500 05/19/13 0426  Weight: 42.3 kg (93 lb 4.1 oz) 43.4 kg (95 lb 10.9 oz)  44.3 kg (97 lb 10.6 oz)    Exam:   General:  Awake, alert, talking and appropriate  Cardiovascular: S1s2/RRR  Respiratory: CTAB  Abdomen: soft, Non tender, BS diminsihed   Musculoskeletal: no edema c/c   Data Reviewed: Basic Metabolic Panel:  Recent Labs Lab 05/15/13 0908 05/16/13 2000 05/17/13 0359 05/18/13 0926 05/19/13 0429  NA 142 138 137 137 140  K 3.4* 3.8 3.2* 3.1* 3.4*  CL  --  97 100 102 109  CO2 27 23 24 23 21   GLUCOSE 94 106* 88 96 77  BUN 10.0 11 8 6  3*  CREATININE 0.6 0.41* 0.44* 0.42* 0.40*  CALCIUM 8.9 9.5 8.1* 7.4* 7.7*   Liver Function Tests:  Recent Labs Lab 05/16/13 2000  AST 29  ALT 8  ALKPHOS 99  BILITOT 0.3  PROT 7.1  ALBUMIN 3.4*    Recent Labs Lab 05/16/13 2000  LIPASE 62*    Recent Labs Lab 05/17/13 0840  AMMONIA 14   CBC:  Recent Labs Lab 05/16/13 2120 05/17/13 0359 05/18/13 0926 05/19/13 0429  WBC 5.2 4.9 5.1 3.4*  NEUTROABS 3.8  --   --   --   HGB 10.5* 8.5* 8.2* 7.8*  HCT 33.2* 26.7* 25.0* 25.0*  MCV 98.8 98.9 97.3 98.0  PLT 192 188 165 166   Cardiac Enzymes: No results found for this basename: CKTOTAL, CKMB, CKMBINDEX, TROPONINI,  in the last 168 hours BNP (last 3 results) No results found for this basename: PROBNP,  in the last 8760 hours CBG: No results found for this basename: GLUCAP,  in the last 168 hours  Recent Results (from the past 240 hour(s))  CULTURE, BLOOD (ROUTINE X 2)     Status: None   Collection Time    05/16/13  9:20 PM      Result Value Ref Range Status   Specimen Description BLOOD LEFT FOREARM   Final   Special Requests BOTTLES DRAWN AEROBIC AND ANAEROBIC 4ML   Final   Culture  Setup Time     Final   Value: 05/17/2013 00:57     Performed at [facility]   Culture     Final   Value:        BLOOD CULTURE RECEIVED NO GROWTH TO DATE CULTURE WILL BE HELD FOR 5 DAYS BEFORE ISSUING A FINAL NEGATIVE REPORT     Performed at [facility]   Report Status PENDING    Incomplete  CULTURE, BLOOD (ROUTINE X 2)     Status: None   Collection Time    05/16/13  9:20 PM      Result Value Ref Range Status   Specimen Description BLOOD RIGHT FOREARM   Final   Special Requests BOTTLES DRAWN AEROBIC AND ANAEROBIC 4ML   Final   Culture  Setup Time     Final   Value: 05/17/2013 00:57     Performed at [facility]   Culture     Final   Value:        BLOOD CULTURE RECEIVED NO GROWTH TO DATE CULTURE WILL BE HELD FOR 5 DAYS BEFORE ISSUING A FINAL NEGATIVE REPORT     Performed at [facility]   Report Status PENDING   Incomplete     Studies: Ct Abdomen Pelvis W Contrast  05/17/2013   ADDENDUM REPORT: 05/17/2013 13:42  ADDENDUM: Please add the following to the impression section:  8.  Sludge ball or non calcified gallstone.   Electronically Signed   By: Lorin Picket M.D.   On: 05/17/2013 13:42   05/17/2013   CLINICAL DATA:  Rectal cancer with recent pan colitis and intractable diarrhea. Persistent diarrhea and nausea. Loss of appetite and generalized weakness.  EXAM: CT ABDOMEN AND PELVIS WITH CONTRAST  TECHNIQUE: Multidetector CT imaging of the abdomen and pelvis was performed using the standard protocol following bolus administration of intravenous contrast.  CONTRAST:  44mL OMNIPAQUE IOHEXOL 300 MG/ML  SOLN  COMPARISON:  CT ABD/PELVIS W CM dated 04/29/2013  FINDINGS: Lung bases show tiny bilateral effusions with compressive atelectasis in both lower lobes. Large hiatal hernia occupies the posterior aspect of the lower right hemi thorax, incompletely imaged. Heart is at the upper limits of normal in size. Mitral annulus and coronary artery calcification. No pericardial effusion.  Liver measures 18.9 cm. Periportal edema and dilatation of the biliary tree, as before. Common bile duct measures up to 11 mm, stable. A 2.0 cm rounded soft tissue lesion is seen in the gallbladder, as before. Adrenal glands are unremarkable. Right renal cortical scarring. A 7 mm  low-attenuation lesion in the lower pole left kidney is unchanged and likely a cyst. Spleen is unremarkable. The majority of the stomach is located within the the above described hiatal hernia. Duodenal diverticulum. Small bowel is otherwise unremarkable. There is diffuse wall thickening involving the colon presacral edema has worsened in the interval.  Atherosclerotic calcification of the arterial vasculature without abdominal aortic aneurysm. Abdominal retroperitoneal lymph nodes measure up to 11 mm in the left periaortic station (image 30), stable. Partially calcified right external iliac nodal mass measures 2.7 x 4.4 cm, stable. A heterogeneous mass is seen in  the right inguinal region, measuring 3.2 x 4.6 cm, likely stable. Right inguinal hernia contains fat and nonobstructed small bowel.  No worrisome lytic or sclerotic lesions. Degenerative changes are seen in the spine.  IMPRESSION: 1. Diffuse colonic wall thickening, unchanged and indicative of pan colitis. Associated presacral edema has increased slightly. 2. Tiny bilateral effusions with compressive atelectasis in both lower lobes. 3. Periportal edema and biliary ductal dilatation are unchanged. 4. Heterogeneous and partially calcified nodal masses in the right external iliac chain and right inguinal region, stable. 5. Large hiatal hernia. 6. Hepatomegaly.  Electronically Signed: By: Lorin Picket M.D. On: 05/17/2013 13:31    Scheduled Meds: . amitriptyline  50 mg Oral QHS  . cholestyramine light  4 g Oral 2 times per day  . enoxaparin (LOVENOX) injection  30 mg Subcutaneous Q24H  . feeding supplement (RESOURCE BREEZE)  1 Container Oral BID BM  . leflunomide  20 mg Oral Daily  . metoprolol succinate  100 mg Oral QPM  . piperacillin-tazobactam (ZOSYN)  IV  3.375 g Intravenous 3 times per day  . potassium chloride SA  40 mEq Oral Daily  . potassium chloride  40 mEq Oral Once  . predniSONE  5 mg Oral Daily  . saccharomyces boulardii  250 mg  Oral BID   Continuous Infusions: . sodium chloride 75 mL/hr at 05/18/13 2303   Antibiotics Given (last 72 hours)   Date/Time Action Medication Dose Rate   05/17/13 0138 Given   piperacillin-tazobactam (ZOSYN) IVPB 3.375 g 3.375 g 12.5 mL/hr   05/17/13 0600 Given   piperacillin-tazobactam (ZOSYN) IVPB 3.375 g 3.375 g 12.5 mL/hr   05/17/13 1343 Given   piperacillin-tazobactam (ZOSYN) IVPB 3.375 g 3.375 g 12.5 mL/hr   05/17/13 2105 Given   piperacillin-tazobactam (ZOSYN) IVPB 3.375 g 3.375 g 12.5 mL/hr   05/18/13 0533 Given   piperacillin-tazobactam (ZOSYN) IVPB 3.375 g 3.375 g 12.5 mL/hr   05/18/13 1354 Given   piperacillin-tazobactam (ZOSYN) IVPB 3.375 g 3.375 g 12.5 mL/hr   05/18/13 2208 Given   piperacillin-tazobactam (ZOSYN) IVPB 3.375 g 3.375 g 12.5 mL/hr   05/19/13 7124 Given   piperacillin-tazobactam (ZOSYN) IVPB 3.375 g 3.375 g 12.5 mL/hr      Principal Problem:   Diarrhea Active Problems:   Hiatal hernia   Dehydration   Protein-calorie malnutrition, severe   Pancolitis   Colitis   Nausea & vomiting   Sepsis    Time spent: 56min    [facility] Pager [phone]. If 7PM-7AM, please contact night-coverage at [email or web address], password [facility] 05/19/2013, 8:38 AM  LOS: 3 days

## 2013-05-19 NOTE — Progress Notes (Signed)
[facility] Progress Note  Subjective: Patient without abdominal pain, tolerating breakfast, stating some pause in her diarrhea yesterday but recurrent last night  Objective: Vital signs in last 24 hours: Temp:  [97.4 F (36.3 C)-98.4 F (36.9 C)] 97.4 F (36.3 C) (04/11 0426) Pulse Rate:  [85-115] 85 (04/11 0426) Resp:  [18-20] 20 (04/11 0426) BP: (109-128)/(51-97) 109/55 mmHg (04/11 0426) SpO2:  [97 %-98 %] 98 % (04/11 0426) Weight:  [44.3 kg (97 lb 10.6 oz)] 44.3 kg (97 lb 10.6 oz) (04/11 0426) Weight change: 0.9 kg (1 lb 15.8 oz)   PE: Frail elderly alert oriented abdomen soft  Lab Results: Results for orders placed during the hospital encounter of 05/16/13 (from the past 24 hour(s))  CBC     Status: Abnormal   Collection Time    05/19/13  4:29 AM      Result Value Ref Range   WBC 3.4 (*) 4.0 - 10.5 K/uL   RBC 2.55 (*) 3.87 - 5.11 MIL/uL   Hemoglobin 7.8 (*) 12.0 - 15.0 g/dL   HCT 25.0 (*) 36.0 - 46.0 %   MCV 98.0  78.0 - 100.0 fL   MCH 30.6  26.0 - 34.0 pg   MCHC 31.2  30.0 - 36.0 g/dL   RDW 16.3 (*) 11.5 - 15.5 %   Platelets 166  150 - 400 K/uL  BASIC METABOLIC PANEL     Status: Abnormal   Collection Time    05/19/13  4:29 AM      Result Value Ref Range   Sodium 140  137 - 147 mEq/L   Potassium 3.4 (*) 3.7 - 5.3 mEq/L   Chloride 109  96 - 112 mEq/L   CO2 21  19 - 32 mEq/L   Glucose, Bld 77  70 - 99 mg/dL   BUN 3 (*) 6 - 23 mg/dL   Creatinine, Ser 0.40 (*) 0.50 - 1.10 mg/dL   Calcium 7.7 (*) 8.4 - 10.5 mg/dL   GFR calc non Af Amer >90  >90 mL/min   GFR calc Af Amer >90  >90 mL/min    Studies/Results: Ct Abdomen Pelvis W Contrast  05/17/2013   ADDENDUM REPORT: 05/17/2013 13:42  ADDENDUM: Please add the following to the impression section:  8.  Sludge ball or non calcified gallstone.   Electronically Signed   By: Lorin Picket M.D.   On: 05/17/2013 13:42   05/17/2013   CLINICAL DATA:  Rectal cancer with recent pan colitis and intractable diarrhea.  Persistent diarrhea and nausea. Loss of appetite and generalized weakness.  EXAM: CT ABDOMEN AND PELVIS WITH CONTRAST  TECHNIQUE: Multidetector CT imaging of the abdomen and pelvis was performed using the standard protocol following bolus administration of intravenous contrast.  CONTRAST:  55mL OMNIPAQUE IOHEXOL 300 MG/ML  SOLN  COMPARISON:  CT ABD/PELVIS W CM dated 04/29/2013  FINDINGS: Lung bases show tiny bilateral effusions with compressive atelectasis in both lower lobes. Large hiatal hernia occupies the posterior aspect of the lower right hemi thorax, incompletely imaged. Heart is at the upper limits of normal in size. Mitral annulus and coronary artery calcification. No pericardial effusion.  Liver measures 18.9 cm. Periportal edema and dilatation of the biliary tree, as before. Common bile duct measures up to 11 mm, stable. A 2.0 cm rounded soft tissue lesion is seen in the gallbladder, as before. Adrenal glands are unremarkable. Right renal cortical scarring. A 7 mm low-attenuation lesion in the lower pole left kidney is unchanged and likely a cyst.  Spleen is unremarkable. The majority of the stomach is located within the the above described hiatal hernia. Duodenal diverticulum. Small bowel is otherwise unremarkable. There is diffuse wall thickening involving the colon presacral edema has worsened in the interval.  Atherosclerotic calcification of the arterial vasculature without abdominal aortic aneurysm. Abdominal retroperitoneal lymph nodes measure up to 11 mm in the left periaortic station (image 30), stable. Partially calcified right external iliac nodal mass measures 2.7 x 4.4 cm, stable. A heterogeneous mass is seen in the right inguinal region, measuring 3.2 x 4.6 cm, likely stable. Right inguinal hernia contains fat and nonobstructed small bowel.  No worrisome lytic or sclerotic lesions. Degenerative changes are seen in the spine.  IMPRESSION: 1. Diffuse colonic wall thickening, unchanged and  indicative of pan colitis. Associated presacral edema has increased slightly. 2. Tiny bilateral effusions with compressive atelectasis in both lower lobes. 3. Periportal edema and biliary ductal dilatation are unchanged. 4. Heterogeneous and partially calcified nodal masses in the right external iliac chain and right inguinal region, stable. 5. Large hiatal hernia. 6. Hepatomegaly.  Electronically Signed: By: Lorin Picket M.D. 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 difficile toxin and GI pathogen panel Consider flexible sigmoidoscopy if no improvement or if studies are nondiagnostic note the patient is a DO NOT RESUSCITATE and some discussion of palliative care consult being considered.    Missy Sabins 05/19/2013, 9:46 AM

## 2013-05-19 NOTE — Evaluation (Signed)
Physical Therapy Evaluation Patient Details Name: [name] MRN: [id number] DOB: [birth year] Today's Date: 05/19/2013   History of Present Illness  Pt was admittedd for intractable diarrhea.  she has a h/o cad, chf, htn, and rectal CA  Clinical Impression  Pt currently requiring min assist for safe performance of basic mobility tasks 2* generalized weakness.  Pt would benefit from ongoing acute stay PT to maximize IND and safety prior to return home with son.  Possible follow up HHPT dependent on acute stay progress.    Follow Up Recommendations Home health PT    Equipment Recommendations  None recommended by PT    Recommendations for Other Services OT consult     Precautions / Restrictions Precautions Precautions: Fall Restrictions Weight Bearing Restrictions: No      Mobility  Bed Mobility Overal bed mobility: Modified Independent             General bed mobility comments: extra time  Transfers Overall transfer level: Needs assistance Equipment used: None Transfers: Sit to/from Stand Sit to Stand: Min assist         General transfer comment: assist to rise and steady  Ambulation/Gait Ambulation/Gait assistance: Min assist Ambulation Distance (Feet): 123 Feet Assistive device: None Gait Pattern/deviations: Decreased step length - right;Decreased step length - left;Antalgic;Narrow base of support     General Gait Details: Min assist for balance deficits - pt ltd by arthritic changes in Bil feet  Stairs            Wheelchair Mobility    Modified Rankin (Stroke Patients Only)       Balance Overall balance assessment: Needs assistance Sitting-balance support: No upper extremity supported       Standing balance support: Single extremity supported                                 Pertinent Vitals/Pain 5/10 bilat foot pain with increased distance ambulated    Home Living Family/patient expects to be discharged to::  Private residence Living Arrangements: Children Available Help at Discharge: Family Type of Home: House Home Access: Level entry       Home Equipment: Walker - 2 wheels Additional Comments: pt doesn't use AD but furniture walks. Sponge bathes at sink    Prior Function Level of Independence: Independent               Hand Dominance        Extremity/Trunk Assessment   Upper Extremity Assessment: Generalized weakness RUE Deficits / Details: RA changes in bil UEs. Can lift RUE to approximately 50 degrees     LUE Deficits / Details: RA changes in hand; shoulder arom to 90   Lower Extremity Assessment: Generalized weakness;RLE deficits/detail;LLE deficits/detail RLE Deficits / Details: RA changes in feet causing discomfort with ambulation LLE Deficits / Details: RA changes in feet causing discomfort with ambulation     Communication   Communication: HOH  Cognition Arousal/Alertness: Awake/alert Behavior During Therapy: WFL for tasks assessed/performed Overall Cognitive Status: Within Functional Limits for tasks assessed                      General Comments      Exercises        Assessment/Plan    PT Assessment Patient needs continued PT services  PT Diagnosis Difficulty walking   PT Problem List Decreased strength;Decreased range of motion;Decreased activity tolerance;Decreased balance;Decreased mobility;Decreased knowledge  of use of DME;Pain  PT Treatment Interventions DME instruction;Gait training;Functional mobility training;Therapeutic activities;Therapeutic exercise;Patient/family education   PT Goals (Current goals can be found in the Care Plan section) Acute Rehab PT Goals Patient Stated Goal: get stronger PT Goal Formulation: With patient Time For Goal Achievement: 06/02/13 Potential to Achieve Goals: Good    Frequency Min 3X/week   Barriers to discharge        Co-evaluation               End of Session Equipment Utilized  During Treatment: Gait belt Activity Tolerance: Patient limited by fatigue;Patient limited by pain (foot pain with ambulation) Patient left: in chair;with call bell/phone within reach;with family/visitor present Nurse Communication: Mobility status         Time: 2202-5427 PT Time Calculation (min): 16 min   Charges:   PT Evaluation $Initial PT Evaluation Tier I: 1 Procedure PT Treatments $Gait Training: 8-22 mins   PT G Codes:          Mathis Fare 05/19/2013, 1:00 PM

## 2013-05-20 LAB — BASIC METABOLIC PANEL
BUN: <3 mg/dL — ABNORMAL LOW (ref 6–23)
CALCIUM: 7.9 mg/dL — AB (ref 8.4–10.5)
CO2: 22 mEq/L (ref 19–32)
CREATININE: 0.46 mg/dL — AB (ref 0.50–1.10)
Chloride: 109 mEq/L (ref 96–112)
GFR calc Af Amer: >90 mL/min (ref >90)
GFR calc non Af Amer: 88 mL/min — ABNORMAL LOW (ref >90)
GLUCOSE: 78 mg/dL (ref 70–99)
Potassium: 3.9 mEq/L (ref 3.7–5.3)
SODIUM: 139 meq/L (ref 137–147)

## 2013-05-20 LAB — CBC
HCT: 26.6 % — ABNORMAL LOW (ref 36.0–46.0)
HEMOGLOBIN: 8.4 g/dL — AB (ref 12.0–15.0)
MCH: 31.5 pg (ref 26.0–34.0)
MCHC: 31.6 g/dL (ref 30.0–36.0)
MCV: 99.6 fL (ref 78.0–100.0)
Platelets: 184 10*3/uL (ref 150–400)
RBC: 2.67 MIL/uL — ABNORMAL LOW (ref 3.87–5.11)
RDW: 16.3 % — ABNORMAL HIGH (ref 11.5–15.5)
WBC: 3.4 10*3/uL — ABNORMAL LOW (ref 4.0–10.5)

## 2013-05-20 NOTE — Progress Notes (Addendum)
[facility] PROGRESS NOTE  [name] YHC:[id number] DOB: 1925-02-10 DOA: 05/16/2013 PCP: Andreas Blower, MD  Assessment/Plan: Sepsis -present on admission -improving, afebrile now, mentation improved -? Due to recurrent colitis -continue Zosyn Day4, IVf, CT with pancolitis unchanged from 3 weeks ago -Fu blood Cx, negative so far -GI pathogen panel pending, Cdiff negative -Recent colitis/Cdiff negative, treated with Cipro/Flagyl then -prognosis appears poor, d/w son, agreeable to DNR now  Diarrhea -chronic issue, ongoing for several months, but acutely worse -7 BMs yesterday -recent pancolitis on CT, repeat CT unchanged -suspect multifactorial, ?radiation procto-colitis , following XRT to Anus for CA -?malabsorption -FU GI pathogen panel, add Immodium, if GI pathogen panel negative -Cdiff negative 2 weeks ago, and again now -Continue Questran and floraster -Greatly appreciate GI assistance, considering Flex Sig-Thank you -tolerating full liquid  Dehydration  - continue IVF  Protein-calorie malnutrition, severe  - Rd consult  History of Anal carcinoma  - Status post radiation treatment   Adult failure to thrive in setting of advanced age,severe malnutrition, recurrent hospitalization, chronic diarrhea, Anal CA  Hypokalemia -due to diarrhea, replace  DVT prophylaxis: Lovenox   CODE STATUS: DNR Family Communication:d/w son, long discussion about declining status, agreeable to DNR now, no family at bedside today Dispo: inpatient    Consultants:  Onc  Antibiotics:  zosyn  HPI/Subjective: Still with diarrhea, otherwise feels ok, drinking some  Objective: Filed Vitals:   05/20/13 0535  BP: 126/69  Pulse: 85  Temp: 97.5 F (36.4 C)  Resp: 16    Intake/Output Summary (Last 24 hours) at 05/20/13 0736 Last data filed at 05/19/13 1900  Gross per 24 hour  Intake 1632.5 ml  Output    750 ml  Net  882.5 ml   Filed Weights   05/18/13 0500  05/19/13 0426 05/20/13 0500  Weight: 43.4 kg (95 lb 10.9 oz) 44.3 kg (97 lb 10.6 oz) 45.4 kg (100 lb 1.4 oz)    Exam:   General:  Awake, alert, talking and appropriate  Cardiovascular: S1s2/RRR  Respiratory: CTAB  Abdomen: soft, Non tender, BS diminsihed   Musculoskeletal: no edema c/c   Data Reviewed: Basic Metabolic Panel:  Recent Labs Lab 05/16/13 2000 05/17/13 0359 05/18/13 0926 05/19/13 0429 05/20/13 0458  NA 138 137 137 140 139  K 3.8 3.2* 3.1* 3.4* 3.9  CL 97 100 102 109 109  CO2 23 24 23 21 22   GLUCOSE 106* 88 96 77 78  BUN 11 8 6  3* <3*  CREATININE 0.41* 0.44* 0.42* 0.40* 0.46*  CALCIUM 9.5 8.1* 7.4* 7.7* 7.9*   Liver Function Tests:  Recent Labs Lab 05/16/13 2000  AST 29  ALT 8  ALKPHOS 99  BILITOT 0.3  PROT 7.1  ALBUMIN 3.4*    Recent Labs Lab 05/16/13 2000  LIPASE 62*    Recent Labs Lab 05/17/13 0840  AMMONIA 14   CBC:  Recent Labs Lab 05/16/13 2120 05/17/13 0359 05/18/13 0926 05/19/13 0429 05/20/13 0458  WBC 5.2 4.9 5.1 3.4* 3.4*  NEUTROABS 3.8  --   --   --   --   HGB 10.5* 8.5* 8.2* 7.8* 8.4*  HCT 33.2* 26.7* 25.0* 25.0* 26.6*  MCV 98.8 98.9 97.3 98.0 99.6  PLT 192 188 165 166 184   Cardiac Enzymes: No results found for this basename: CKTOTAL, CKMB, CKMBINDEX, TROPONINI,  in the last 168 hours BNP (last 3 results) No results found for this basename: PROBNP,  in the last 8760 hours CBG: No results found for  this basename: GLUCAP,  in the last 168 hours  Recent Results (from the past 240 hour(s))  CULTURE, BLOOD (ROUTINE X 2)     Status: None   Collection Time    05/16/13  9:20 PM      Result Value Ref Range Status   Specimen Description BLOOD LEFT FOREARM   Final   Special Requests BOTTLES DRAWN AEROBIC AND ANAEROBIC 4ML   Final   Culture  Setup Time     Final   Value: 05/17/2013 00:57     Performed at [facility]   Culture     Final   Value:        BLOOD CULTURE RECEIVED NO GROWTH TO DATE CULTURE  WILL BE HELD FOR 5 DAYS BEFORE ISSUING A FINAL NEGATIVE REPORT     Performed at [facility]   Report Status PENDING   Incomplete  CULTURE, BLOOD (ROUTINE X 2)     Status: None   Collection Time    05/16/13  9:20 PM      Result Value Ref Range Status   Specimen Description BLOOD RIGHT FOREARM   Final   Special Requests BOTTLES DRAWN AEROBIC AND ANAEROBIC 4ML   Final   Culture  Setup Time     Final   Value: 05/17/2013 00:57     Performed at [facility]   Culture     Final   Value:        BLOOD CULTURE RECEIVED NO GROWTH TO DATE CULTURE WILL BE HELD FOR 5 DAYS BEFORE ISSUING A FINAL NEGATIVE REPORT     Performed at [facility]   Report Status PENDING   Incomplete  CLOSTRIDIUM DIFFICILE BY PCR     Status: None   Collection Time    05/18/13  8:38 PM      Result Value Ref Range Status   C difficile by pcr NEGATIVE  NEGATIVE Final   Comment: Performed at [facility]     Studies: No results found.  Scheduled Meds: . amitriptyline  50 mg Oral QHS  . enoxaparin (LOVENOX) injection  30 mg Subcutaneous Q24H  . feeding supplement (RESOURCE BREEZE)  1 Container Oral BID BM  . leflunomide  20 mg Oral Daily  . metoprolol succinate  100 mg Oral QPM  . piperacillin-tazobactam (ZOSYN)  IV  3.375 g Intravenous 3 times per day  . potassium chloride SA  40 mEq Oral Daily  . predniSONE  5 mg Oral Daily  . saccharomyces boulardii  250 mg Oral BID   Continuous Infusions: . sodium chloride 75 mL/hr at 05/20/13 0234   Antibiotics Given (last 72 hours)   Date/Time Action Medication Dose Rate   05/17/13 1343 Given   piperacillin-tazobactam (ZOSYN) IVPB 3.375 g 3.375 g 12.5 mL/hr   05/17/13 2105 Given   piperacillin-tazobactam (ZOSYN) IVPB 3.375 g 3.375 g 12.5 mL/hr   05/18/13 0533 Given   piperacillin-tazobactam (ZOSYN) IVPB 3.375 g 3.375 g 12.5 mL/hr   05/18/13 1354 Given   piperacillin-tazobactam (ZOSYN) IVPB 3.375 g 3.375 g 12.5 mL/hr   05/18/13 2208 Given    piperacillin-tazobactam (ZOSYN) IVPB 3.375 g 3.375 g 12.5 mL/hr   05/19/13 3532 Given   piperacillin-tazobactam (ZOSYN) IVPB 3.375 g 3.375 g 12.5 mL/hr   05/19/13 1355 Given   piperacillin-tazobactam (ZOSYN) IVPB 3.375 g 3.375 g 12.5 mL/hr   05/19/13 2234 Given   piperacillin-tazobactam (ZOSYN) IVPB 3.375 g 3.375 g 12.5 mL/hr   05/20/13 0536 Given  piperacillin-tazobactam (ZOSYN) IVPB 3.375 g 3.375 g 12.5 mL/hr      Principal Problem:   Diarrhea Active Problems:   Hiatal hernia   Dehydration   Protein-calorie malnutrition, severe   Pancolitis   Colitis   Nausea & vomiting   Sepsis    Time spent: 66min    [facility] Pager [phone]. If 7PM-7AM, please contact night-coverage at [email or web address], password [facility] 05/20/2013, 7:36 AM  LOS: 4 days

## 2013-05-20 NOTE — Progress Notes (Signed)
[facility] Progress Note  Subjective: Still having a lot of diarrhea also perianal tenderness, no blood or cramps  Objective: Vital signs in last 24 hours: Temp:  [97.4 F (36.3 C)-98 F (36.7 C)] 97.5 F (36.4 C) (04/12 0535) Pulse Rate:  [85-105] 85 (04/12 0535) Resp:  [16-18] 16 (04/12 0535) BP: (122-140)/(69-78) 126/69 mmHg (04/12 0535) SpO2:  [97 %-99 %] 97 % (04/12 0535) Weight:  [45.4 kg (100 lb 1.4 oz)] 45.4 kg (100 lb 1.4 oz) (04/12 0500) Weight change: 1.1 kg (2 lb 6.8 oz)   PE: Unchanged  Lab Results: Results for orders placed during the hospital encounter of 05/16/13 (from the past 24 hour(s))  CBC     Status: Abnormal   Collection Time    05/20/13  4:58 AM      Result Value Ref Range   WBC 3.4 (*) 4.0 - 10.5 K/uL   RBC 2.67 (*) 3.87 - 5.11 MIL/uL   Hemoglobin 8.4 (*) 12.0 - 15.0 g/dL   HCT 26.6 (*) 36.0 - 46.0 %   MCV 99.6  78.0 - 100.0 fL   MCH 31.5  26.0 - 34.0 pg   MCHC 31.6  30.0 - 36.0 g/dL   RDW 16.3 (*) 11.5 - 15.5 %   Platelets 184  150 - 400 K/uL  BASIC METABOLIC PANEL     Status: Abnormal   Collection Time    05/20/13  4:58 AM      Result Value Ref Range   Sodium 139  137 - 147 mEq/L   Potassium 3.9  3.7 - 5.3 mEq/L   Chloride 109  96 - 112 mEq/L   CO2 22  19 - 32 mEq/L   Glucose, Bld 78  70 - 99 mg/dL   BUN <3 (*) 6 - 23 mg/dL   Creatinine, Ser 0.46 (*) 0.50 - 1.10 mg/dL   Calcium 7.9 (*) 8.4 - 10.5 mg/dL   GFR calc non Af Amer 88 (*) >90 mL/min   GFR calc Af Amer >90  >90 mL/min    Studies/Results: No results found.    Assessment: Squamous cell carcinoma the anus status post radiation therapy, with a 6 week history of diarrhea, C. difficile toxin negative  Plan: On florastar and Questran for now, awaiting GI pathogens panel. If negative consider flexible sigmoidoscopy given colonic thickening on CT scan which could represent hypoalbuminemia versus true inflammation.    [name] 05/20/2013, 11:20 AM

## 2013-05-20 NOTE — Progress Notes (Signed)
ANTIBIOTIC CONSULT NOTE - INITIAL  Pharmacy Consult for Zosyn Indication: intra-abdominal infection   Allergies  Allergen Reactions  . Ativan [Lorazepam]     Confused and out of it    Patient Measurements: Height: 5\' 3"  (160 cm) Weight: 100 lb 1.4 oz (45.4 kg) IBW/kg (Calculated) : 52.4   Vital Signs: Temp: 97.5 F (36.4 C) (04/12 0535) Temp src: Oral (04/12 0535) BP: 126/69 mmHg (04/12 0535) Pulse Rate: 85 (04/12 0535) Intake/Output from previous day: 04/11 0701 - 04/12 0700 In: 1632.5 [P.O.:720; I.V.:900; IV Piggyback:12.5] Out: 841 [Urine:875] Intake/Output from this shift: Total I/O In: -  Out: 600 [Urine:600]  Labs:  Recent Labs  05/18/13 0926 05/19/13 0429 05/20/13 0458  WBC 5.1 3.4* 3.4*  HGB 8.2* 7.8* 8.4*  PLT 165 166 184  CREATININE 0.42* 0.40* 0.46*   Estimated Creatinine Clearance: 36.8 ml/min (by C-G formula based on Cr of 0.46). No results found for this basename: VANCOTROUGH, Corlis Leak, VANCORANDOM, [place], [place], GENTRANDOM, [place], TOBRAPEAK, TOBRARND, AMIKACINPEAK, AMIKACINTROU, AMIKACIN,  in the last 72 hours   Microbiology: Recent Results (from the past 720 hour(s))  CLOSTRIDIUM DIFFICILE BY PCR     Status: None   Collection Time    04/29/13  2:29 PM      Result Value Ref Range Status   C difficile by pcr NEGATIVE  NEGATIVE Final   Comment: Performed at [facility]     Status: None   Collection Time    04/29/13  2:29 PM      Result Value Ref Range Status   Specimen Description STOOL   Final   Special Requests Normal   Final   Culture     Final   Value: NO SALMONELLA, SHIGELLA, CAMPYLOBACTER, YERSINIA, OR E.COLI 0157:H7 ISOLATED     Performed at [facility]   Report Status 05/03/2013 FINAL   Final  TECHNOLOGIST REVIEW     Status: None   Collection Time    05/07/13  9:32 AM      Result Value Ref Range Status   Technologist Review Metas and Myelocytes present   Final  CULTURE, BLOOD  (ROUTINE X 2)     Status: None   Collection Time    05/16/13  9:20 PM      Result Value Ref Range Status   Specimen Description BLOOD LEFT FOREARM   Final   Special Requests BOTTLES DRAWN AEROBIC AND ANAEROBIC 4ML   Final   Culture  Setup Time     Final   Value: 05/17/2013 00:57     Performed at [facility]   Culture     Final   Value:        BLOOD CULTURE RECEIVED NO GROWTH TO DATE CULTURE WILL BE HELD FOR 5 DAYS BEFORE ISSUING A FINAL NEGATIVE REPORT     Performed at [facility]   Report Status PENDING   Incomplete  CULTURE, BLOOD (ROUTINE X 2)     Status: None   Collection Time    05/16/13  9:20 PM      Result Value Ref Range Status   Specimen Description BLOOD RIGHT FOREARM   Final   Special Requests BOTTLES DRAWN AEROBIC AND ANAEROBIC 4ML   Final   Culture  Setup Time     Final   Value: 05/17/2013 00:57     Performed at [facility]   Culture     Final   Value:  BLOOD CULTURE RECEIVED NO GROWTH TO DATE CULTURE WILL BE HELD FOR 5 DAYS BEFORE ISSUING A FINAL NEGATIVE REPORT     Performed at [facility]   Report Status PENDING   Incomplete  CLOSTRIDIUM DIFFICILE BY PCR     Status: None   Collection Time    05/18/13  8:38 PM      Result Value Ref Range Status   C difficile by pcr NEGATIVE  NEGATIVE Final   Comment: Performed at [facility] History: Past Medical History  Diagnosis Date  . CAD (coronary artery disease)     2 vessel intervention 2003  . Dyslipidemia   . CHF (congestive heart failure)     single episode  . Mitral regurgitation     mild prolapse anterior & posterior leaflets  . Aortic valve sclerosis     mild moderate calcification, echo, 2009  . HTN (hypertension)     difficult to obtain BP at times.  [name] Kitchen Urethral trauma     bleeding in hospital  . Rheumatoid arthritis(714.0)     severe - deforming  . Lung disease, interstitial     related to rheumatoid arthritis, also question  of left apical  nodule...followed elsewhere..my understanding stabilized  . Carotid artery disease     doppler 12/25/2010,  0-39% bilateral  . Ejection fraction     EF 60%, echo, 11/2007  . Clot     ??? apical clot in the past ?? no longer an issue  . Drug therapy     Prednisone.  . Depression   . GERD (gastroesophageal reflux disease)   . Cataract   . Aortic stenosis     Moderately severe, echo, December, 2013  . Rectal bleed 04/06/2012  . Anemia   . Edema     May, 2014  . History of radiation therapy 05/11/12-05/19/12    pelvis/inguinal lymphnodes/only able to complete 6 fx  . S/P radiation therapy 12/11/2012-12/21/2012    Rectum and anus / 12.5 Gy at 2.5 Gy per fraction x 5 fractions delivered every other day  . Degeneration macular   . 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 Cancer     rectal    Medications:  Scheduled:  . amitriptyline  50 mg Oral QHS  . enoxaparin (LOVENOX) injection  30 mg Subcutaneous Q24H  . feeding supplement (RESOURCE BREEZE)  1 Container Oral BID BM  . leflunomide  20 mg Oral Daily  . metoprolol succinate  100 mg Oral QPM  . piperacillin-tazobactam (ZOSYN)  IV  3.375 g Intravenous 3 times per day  . potassium chloride SA  40 mEq Oral Daily  . predniSONE  5 mg Oral Daily  . saccharomyces boulardii  250 mg Oral BID   Infusions:  . sodium chloride 75 mL/hr at 05/20/13 0234   PRN: acetaminophen, acetaminophen, HYDROcodone-acetaminophen, HYDROmorphone (DILAUDID) injection, ondansetron (ZOFRAN) IV, ondansetron, polyvinyl alcohol Assessment: 80 yo M with squamous cell carcinoma of the anus s/p radiation therapy with a  6 week history of Diarrhea.  Patient was recently treated with Cipro/flagyl with no improvement in symptoms. Currently on D#4 Zosyn 3.375 grams IV q8h for pancolitis, infuse over 4 hours.  C.Diff negative. Awaiting GI pathogens from stool sample.   Tmax: AF now WBCs: WNL Renal: Creatinine 0.46; Cl ~37N  4/8 blood: ngtd 4/10  C.diff: Negative 4/10 Stool: Sent   Goal of Therapy:  Zosyn per renal function   Plan:  1.) Continue Zosyn 3.375 grams IV q8h, infuse over 4 hours 2.) f/u stool and blood cultures,  Monitor renal function  3.) will follow with primary and GI  Gaye Alken Clatie Kessen PharmD Pager #: [phone] 11:50 AM 05/20/2013

## 2013-05-21 DIAGNOSIS — I2510 Atherosclerotic heart disease of native coronary artery without angina pectoris: Secondary | ICD-10-CM

## 2013-05-21 LAB — URINALYSIS, ROUTINE W REFLEX MICROSCOPIC
BILIRUBIN URINE: NEGATIVE
Glucose, UA: NEGATIVE mg/dL
HGB URINE DIPSTICK: NEGATIVE
KETONES UR: NEGATIVE mg/dL
Leukocytes, UA: NEGATIVE
NITRITE: NEGATIVE
PROTEIN: NEGATIVE mg/dL
Specific Gravity, Urine: 1.009 (ref 1.005–1.030)
UROBILINOGEN UA: 0.2 mg/dL (ref 0.0–1.0)
pH: 5.5 (ref 5.0–8.0)

## 2013-05-21 LAB — BASIC METABOLIC PANEL
BUN: <3 mg/dL — ABNORMAL LOW (ref 6–23)
CALCIUM: 7.6 mg/dL — AB (ref 8.4–10.5)
CO2: 20 mEq/L (ref 19–32)
Chloride: 110 mEq/L (ref 96–112)
Creatinine, Ser: 0.44 mg/dL — ABNORMAL LOW (ref 0.50–1.10)
GFR, EST NON AFRICAN AMERICAN: 89 mL/min — AB (ref >90)
GLUCOSE: 78 mg/dL (ref 70–99)
Potassium: 3.4 mEq/L — ABNORMAL LOW (ref 3.7–5.3)
Sodium: 140 mEq/L (ref 137–147)

## 2013-05-21 LAB — GI PATHOGEN PANEL BY PCR, STOOL
C DIFFICILE TOXIN A/B: NEGATIVE
CRYPTOSPORIDIUM BY PCR: NEGATIVE
Campylobacter by PCR: NEGATIVE
E coli (ETEC) LT/ST: NEGATIVE
E coli (STEC): NEGATIVE
E coli 0157 by PCR: NEGATIVE
G LAMBLIA BY PCR: NEGATIVE
Norovirus GI/GII: NEGATIVE
ROTAVIRUS A BY PCR: NEGATIVE
Salmonella by PCR: NEGATIVE
Shigella by PCR: NEGATIVE

## 2013-05-21 MED ORDER — SODIUM CHLORIDE 0.9 % IV SOLN
INTRAVENOUS | Status: DC
  Administered 2013-05-21 – 2013-05-23 (×3): via INTRAVENOUS
  Filled 2013-05-21 (×5): qty 1000

## 2013-05-21 NOTE — Care Management Note (Signed)
Cm spoke with patient at the bedside concerning discharge planning. PT eval recommendation for HHPT.Pt provided home health list for [facility]. Pt states having rw and cane for home dme use. Pt resides home with son. Per pt declining HH services at this time. No other needs identified at this time.    Venita Lick Mertie Haslem,MSN,RN [phone]

## 2013-05-21 NOTE — Progress Notes (Signed)
Physical Therapy Treatment Patient Details Name: [name] MRN: [id number] DOB: March 11, 1925 Today's Date: 05/21/2013    History of Present Illness Pt was admittedd for intractable diarrhea.  she has a h/o cad, chf, htn, and rectal CA    PT Comments    Pt in bed on RA.  Assisted OOB to amb to [facility] to void (300) then amb in hallway twice due to the need for a long sitting rest break.  Amb with RW this session however had difficulty grasping and maneuvering correctly due to B arthritic hands.  Pt c/o walker did not steer right for her.  Assisted back to bed.   Follow Up Recommendations  Home health PT     Equipment Recommendations  None recommended by PT    Recommendations for Other Services       Precautions / Restrictions Precautions Precautions: Fall Restrictions Weight Bearing Restrictions: No    Mobility  Bed Mobility Overal bed mobility: Modified Independent             General bed mobility comments: extra time in/out of bed  Transfers Overall transfer level: Needs assistance Equipment used: Rolling walker (2 wheeled)   Sit to Stand: Min guard;Min assist         General transfer comment: assist OOb and on/off BSC with increased time  Ambulation/Gait   Ambulation Distance (Feet): 50 Feet (25' x 2 one sitting rest break) Assistive device: Rolling walker (2 wheeled) Gait Pattern/deviations: Step-through pattern;Decreased stride length;Narrow base of support Gait velocity: decreased   General Gait Details: Min assist for balance deficits and c/o "bad feet".  Limited activity tolerance as pt required a long sitting rest break demon 2/4 DOE.  "I get short of breath".   Stairs            Wheelchair Mobility    Modified Rankin (Stroke Patients Only)       Balance                                    Cognition                            Exercises      General Comments        Pertinent Vitals/Pain      Home Living                      Prior Function            PT Goals (current goals can now be found in the care plan section) Progress towards PT goals: Progressing toward goals    Frequency  Min 3X/week    PT Plan      Co-evaluation             End of Session Equipment Utilized During Treatment: Gait belt Activity Tolerance: Patient limited by fatigue;Patient limited by pain Patient left: in bed;with call bell/phone within reach     Time: 1344-1412 PT Time Calculation (min): 28 min  Charges:  $Gait Training: 8-22 mins $Therapeutic Exercise: 8-22 mins                    G Codes:      Rica Koyanagi  PTA [facility]  Acute  Rehab Pager      [phone]

## 2013-05-21 NOTE — Progress Notes (Signed)
Occupational Therapy Treatment Patient Details Name: [name] MRN: [id number] DOB: 12-12-1925 Today's Date: 05/21/2013    History of present illness Pt was admittedd for intractable diarrhea.  she has a h/o cad, chf, htn, and rectal CA      Follow Up Recommendations  Supervision/Assistance - 24 hour;Home health OT (vs snf, if she doesn't have this)    Equipment Recommendations  None recommended by OT       Precautions / Restrictions Precautions Precautions: Fall Restrictions Weight Bearing Restrictions: No       Mobility Bed Mobility Overal bed mobility: Modified Independent             General bed mobility comments: extra time in/out of bed  Transfers Overall transfer level: Needs assistance Equipment used: Rolling walker (2 wheeled)   Sit to Stand: Min guard;Min assist         General transfer comment: assist OOb and on/off BSC with increased time        ADL Overall ADL's : Needs assistance/impaired Eating/Feeding: Set up;Sitting   Grooming: Sitting;Set up                                                  Cognition   Behavior During Therapy: [facility] for tasks assessed/performed Overall Cognitive Status: Within Functional Limits for tasks assessed                       Extremity/Trunk Assessment               Exercises       Home Living                                              Frequency Min 2X/week     Progress Toward Goals  OT Goals(current goals can now be found in the care plan section)     Acute Rehab OT Goals Patient Stated Goal: get stronger OT Goal Formulation: With patient Time For Goal Achievement: 06/01/13 Potential to Achieve Goals: Good  Plan            Activity Tolerance Patient tolerated treatment well   Patient Left in chair;with call bell/phone within reach;with chair alarm set;with family/visitor present           Time: 6789-3810 OT Time  Calculation (min): 12 min  Charges: OT General Charges $OT Visit: 1 Procedure OT Treatments $Self Care/Home Management : 8-22 mins  [name] 05/21/2013, 4:27 PM

## 2013-05-21 NOTE — Progress Notes (Signed)
The patient and her nurse both indicates that her diarrhea is less severe than it was.  The GI pathogen panel in her stool is still pending at this time.  The option of sigmoidoscopy, for further evaluation of her diarrhea, was discussed, including the risks of that procedure which would be rather minimal. For now, she would like to hold off, and discuss it with her son before making any decisions.   I will plan to recheck the patient tomorrow.  Cleotis Nipper, M.D. [phone]

## 2013-05-21 NOTE — Progress Notes (Signed)
[facility] PROGRESS NOTE  [name] DUK:[id number] DOB: 21-Nov-1925 DOA: 05/16/2013 PCP: Andreas Blower, MD  Assessment/Plan: Sepsis -present on admission -improving, afebrile now, mentation improved -? Due to recurrent colitis -continue Zosyn Day5, IVf, CT with pancolitis unchanged from 3 weeks ago -Fu blood Cx, negative so far -GI pathogen panel pending, Cdiff negative -Recent colitis/Cdiff negative, treated with Cipro/Flagyl then  Diarrhea -chronic issue, ongoing for several months, but acutely worse -7 BMs 4/11 and 7BMs overnight 4/12-13 -recent pancolitis on CT, repeat CT unchanged -suspect multifactorial, ?radiation procto-colitis , following XRT to Anus for CA -?malabsorption -FU GI pathogen panel, add Immodium, if GI pathogen panel negative -Cdiff negative 2 weeks ago, and again now -Continue Questran and floraster -Greatly appreciate GI assistance, considering Flex Sig-Thank you -tolerating full liquid  Dehydration  - continue IVF  Protein-calorie malnutrition, severe  - Rd consult  History of Anal carcinoma  - Status post radiation treatment   Adult failure to thrive in setting of advanced age,severe malnutrition, recurrent hospitalization, chronic diarrhea, Anal CA  Hypokalemia -due to diarrhea, replace  DVT prophylaxis: Lovenox   CODE STATUS: DNR Family Communication:d/w son, long discussion about declining status, agreeable to DNR now, no family at bedside today Dispo: inpatient    Consultants:  Onc  Antibiotics:  zosyn  HPI/Subjective: Still with diarrhea, otherwise feels ok, drinking some  Objective: Filed Vitals:   05/21/13 0415  BP: 147/83  Pulse: 91  Temp: 97.8 F (36.6 C)  Resp:     Intake/Output Summary (Last 24 hours) at 05/21/13 0732 Last data filed at 05/20/13 1702  Gross per 24 hour  Intake    360 ml  Output   1050 ml  Net   -690 ml   Filed Weights   05/19/13 0426 05/20/13 0500 05/21/13 0415   Weight: 44.3 kg (97 lb 10.6 oz) 45.4 kg (100 lb 1.4 oz) 45.4 kg (100 lb 1.4 oz)    Exam:   General:  Awake, alert, talking and appropriate  Cardiovascular: S1s2/RRR  Respiratory: CTAB  Abdomen: soft, Non tender, BS diminsihed   Musculoskeletal: no edema c/c   Data Reviewed: Basic Metabolic Panel:  Recent Labs Lab 05/17/13 0359 05/18/13 0926 05/19/13 0429 05/20/13 0458 05/21/13 0337  NA 137 137 140 139 140  K 3.2* 3.1* 3.4* 3.9 3.4*  CL 100 102 109 109 110  CO2 24 23 21 22 20   GLUCOSE 88 96 77 78 78  BUN 8 6 3* <3* <3*  CREATININE 0.44* 0.42* 0.40* 0.46* 0.44*  CALCIUM 8.1* 7.4* 7.7* 7.9* 7.6*   Liver Function Tests:  Recent Labs Lab 05/16/13 2000  AST 29  ALT 8  ALKPHOS 99  BILITOT 0.3  PROT 7.1  ALBUMIN 3.4*    Recent Labs Lab 05/16/13 2000  LIPASE 62*    Recent Labs Lab 05/17/13 0840  AMMONIA 14   CBC:  Recent Labs Lab 05/16/13 2120 05/17/13 0359 05/18/13 0926 05/19/13 0429 05/20/13 0458  WBC 5.2 4.9 5.1 3.4* 3.4*  NEUTROABS 3.8  --   --   --   --   HGB 10.5* 8.5* 8.2* 7.8* 8.4*  HCT 33.2* 26.7* 25.0* 25.0* 26.6*  MCV 98.8 98.9 97.3 98.0 99.6  PLT 192 188 165 166 184   Cardiac Enzymes: No results found for this basename: CKTOTAL, CKMB, CKMBINDEX, TROPONINI,  in the last 168 hours BNP (last 3 results) No results found for this basename: PROBNP,  in the last 8760 hours CBG: No results found for this basename:  GLUCAP,  in the last 168 hours  Recent Results (from the past 240 hour(s))  CULTURE, BLOOD (ROUTINE X 2)     Status: None   Collection Time    05/16/13  9:20 PM      Result Value Ref Range Status   Specimen Description BLOOD LEFT FOREARM   Final   Special Requests BOTTLES DRAWN AEROBIC AND ANAEROBIC 4ML   Final   Culture  Setup Time     Final   Value: 05/17/2013 00:57     Performed at [facility]   Culture     Final   Value:        BLOOD CULTURE RECEIVED NO GROWTH TO DATE CULTURE WILL BE HELD FOR 5 DAYS  BEFORE ISSUING A FINAL NEGATIVE REPORT     Performed at [facility]   Report Status PENDING   Incomplete  CULTURE, BLOOD (ROUTINE X 2)     Status: None   Collection Time    05/16/13  9:20 PM      Result Value Ref Range Status   Specimen Description BLOOD RIGHT FOREARM   Final   Special Requests BOTTLES DRAWN AEROBIC AND ANAEROBIC 4ML   Final   Culture  Setup Time     Final   Value: 05/17/2013 00:57     Performed at [facility]   Culture     Final   Value:        BLOOD CULTURE RECEIVED NO GROWTH TO DATE CULTURE WILL BE HELD FOR 5 DAYS BEFORE ISSUING A FINAL NEGATIVE REPORT     Performed at [facility]   Report Status PENDING   Incomplete  STOOL CULTURE     Status: None   Collection Time    05/18/13  8:38 PM      Result Value Ref Range Status   Specimen Description STOOL   Final   Special Requests NONE   Final   Culture     Final   Value: NO SUSPICIOUS COLONIES, CONTINUING TO HOLD     Note: REDUCED NORMAL FLORA PRESENT     Performed at [facility]   Report Status PENDING   Incomplete  CLOSTRIDIUM DIFFICILE BY PCR     Status: None   Collection Time    05/18/13  8:38 PM      Result Value Ref Range Status   C difficile by pcr NEGATIVE  NEGATIVE Final   Comment: Performed at [facility]     Studies: No results found.  Scheduled Meds: . amitriptyline  50 mg Oral QHS  . enoxaparin (LOVENOX) injection  30 mg Subcutaneous Q24H  . feeding supplement (RESOURCE BREEZE)  1 Container Oral BID BM  . leflunomide  20 mg Oral Daily  . metoprolol succinate  100 mg Oral QPM  . piperacillin-tazobactam (ZOSYN)  IV  3.375 g Intravenous 3 times per day  . potassium chloride SA  40 mEq Oral Daily  . predniSONE  5 mg Oral Daily  . saccharomyces boulardii  250 mg Oral BID   Continuous Infusions: . sodium chloride 0.9 % 1,000 mL with potassium chloride 40 mEq infusion     Antibiotics Given (last 72 hours)   Date/Time Action Medication Dose Rate    05/18/13 1354 Given   piperacillin-tazobactam (ZOSYN) IVPB 3.375 g 3.375 g 12.5 mL/hr   05/18/13 2208 Given   piperacillin-tazobactam (ZOSYN) IVPB 3.375 g 3.375 g 12.5 mL/hr   05/19/13 0613 Given   piperacillin-tazobactam (ZOSYN)  IVPB 3.375 g 3.375 g 12.5 mL/hr   05/19/13 1355 Given   piperacillin-tazobactam (ZOSYN) IVPB 3.375 g 3.375 g 12.5 mL/hr   05/19/13 2234 Given   piperacillin-tazobactam (ZOSYN) IVPB 3.375 g 3.375 g 12.5 mL/hr   05/20/13 0536 Given   piperacillin-tazobactam (ZOSYN) IVPB 3.375 g 3.375 g 12.5 mL/hr   05/20/13 1322 Given   piperacillin-tazobactam (ZOSYN) IVPB 3.375 g 3.375 g 12.5 mL/hr   05/20/13 2208 Given   piperacillin-tazobactam (ZOSYN) IVPB 3.375 g 3.375 g 12.5 mL/hr   05/21/13 7414 Given   piperacillin-tazobactam (ZOSYN) IVPB 3.375 g 3.375 g 12.5 mL/hr      Principal Problem:   Diarrhea Active Problems:   Hiatal hernia   Dehydration   Protein-calorie malnutrition, severe   Pancolitis   Colitis   Nausea & vomiting   Sepsis    Time spent: 63min    [place] [facility] Pager [phone]. If 7PM-7AM, please contact night-coverage at [email or web address], password [facility] 05/21/2013, 7:32 AM  LOS: 5 days

## 2013-05-22 ENCOUNTER — Other Ambulatory Visit: Payer: Medicare Other

## 2013-05-22 LAB — STOOL CULTURE

## 2013-05-22 LAB — URINE CULTURE
Colony Count: NO GROWTH
Culture: NO GROWTH
SPECIAL REQUESTS: NORMAL

## 2013-05-22 MED ORDER — LOPERAMIDE HCL 2 MG PO CAPS
2.0000 mg | ORAL_CAPSULE | Freq: Two times a day (BID) | ORAL | Status: DC
  Administered 2013-05-22: 2 mg via ORAL
  Filled 2013-05-22: qty 1

## 2013-05-22 MED ORDER — LOPERAMIDE HCL 2 MG PO CAPS
4.0000 mg | ORAL_CAPSULE | ORAL | Status: DC | PRN
  Administered 2013-05-22 – 2013-05-29 (×11): 4 mg via ORAL
  Filled 2013-05-22 (×11): qty 2

## 2013-05-22 NOTE — Progress Notes (Signed)
I spoke with the patient's son, Legrand Como, for some time on the phone today, and he is of the same opinion as his mother, namely, that they would prefer to hold off on sigmoidoscopic evaluation with biopsies to try to further delineate the nature of the colitis observed on her CT, recognizing that there is a relatively low chance that biopsies would find a definitive, treatable condition.  He did request that we increase her dose of Imodium, which was helpful for her at home, and I have ordered that.  I will plan to follow the patient at a distance. The patient and her son are both aware that I would be happy to remain on standby for sigmoidoscopic evaluation with biopsies at a later time, if desired.  Cleotis Nipper, M.D. [phone]

## 2013-05-22 NOTE — Progress Notes (Signed)
I have reviewed this note and agree with all findings. Kati Klay Sobotka, PT, DPT Pager: [phone]   

## 2013-05-22 NOTE — Progress Notes (Signed)
Physical Therapy Treatment Patient Details Name: [name] MRN: [id number] DOB: November 27, 1925 Today's Date: 05/22/2013    History of Present Illness Pt is an 80 year old female recently discharged on 05/03/2013 with pancolitis, re-admitted 4/08 for intractable diarrhea; PMH of arthritis noted at hands and feet, CAD, CHF, HTN, and rectal CA    PT Comments    Pt performed bed level exercises, reported wanting to be stronger to get OOB more easily.  Ambulated in hallway, reports at home she typically takes lots of breaks.  Preferred using rollator to allow easily manipulation of walker.  Used BSC requiring assist for hygiene before continuing ambulation.  Follow Up Recommendations  Home health PT     Equipment Recommendations  None recommended by PT    Recommendations for Other Services       Precautions / Restrictions Precautions Precautions: Fall Restrictions Weight Bearing Restrictions: No    Mobility  Bed Mobility Overal bed mobility: Needs Assistance Bed Mobility: Supine to Sit;Sit to Supine     Supine to sit: Min guard Sit to supine: Supervision   General bed mobility comments: verbal cues for safety; pt took extra time gettign in an out of bed  Transfers Overall transfer level: Needs assistance Equipment used: 4-wheeled walker Transfers: Sit to/from Stand Sit to Stand: Min guard         General transfer comment: verbal cues for safety and instruction on locking RW, performed transfer on/off BSC required assist for hygeine  Ambulation/Gait Ambulation/Gait assistance: Min guard Ambulation Distance (Feet): 160 Feet Assistive device: 4-wheeled walker Gait Pattern/deviations: Step-through pattern;Decreased stride length;Narrow base of support Gait velocity: decreased   General Gait Details: verbal cues for safety and sequence; pt reports she typically takes breaks at home, therefore 80 ft x2 for ambulation with break in between, reports feet feel stiff from  arthritis   Stairs            Wheelchair Mobility    Modified Rankin (Stroke Patients Only)       Balance                                    Cognition Arousal/Alertness: Awake/alert Behavior During Therapy: WFL for tasks assessed/performed Overall Cognitive Status: Within Functional Limits for tasks assessed                      Exercises General Exercises - Lower Extremity Ankle Circles/Pumps: AROM;Both;20 reps;Seated Heel Slides: AROM;Both;5 reps;Seated Hip ABduction/ADduction: AROM;Both;5 reps;Seated Other Exercises Other Exercises: Towel Squeeze; AROM; Both; 10 reps; Seated    General Comments        Pertinent Vitals/Pain Complains of stiffness in feet which ached when walking.  Activity to tolerance.    Home Living                      Prior Function            PT Goals (current goals can now be found in the care plan section) Progress towards PT goals: Progressing toward goals    Frequency  Min 3X/week    PT Plan Current plan remains appropriate    Co-evaluation             End of Session Equipment Utilized During Treatment: Gait belt Activity Tolerance: Patient tolerated treatment well Patient left: in bed;with bed alarm set;with call bell/phone within reach;with nursing/sitter in room  Time: 4158-3094 PT Time Calculation (min): 23 min  Charges:  $Gait Training: 8-22 mins $Therapeutic Activity: 8-22 mins                    G Codes:      Jacqulyn Cane 05-30-13, 12:46 PM Jacqulyn Cane SPT 05/30/13

## 2013-05-22 NOTE — Progress Notes (Addendum)
[facility] PROGRESS NOTE  [name] WCH:[id number] DOB: [birth year] DOA: 05/16/2013 PCP: Andreas Blower, MD Brief Narrative: Patient is 87/F with h/o CAD, hypertension, Anal Ca s/p XRT in 11/14 and CHF, who was recently discharged on 05/03/2013, after treatment for pancolitis with Cipro/Flagyl course which she completed, She was admitted with worsening of her chronic diarrhea, over 6-7 weeks, fever and then developed metabolic encephalopathy from this, repeat CT showed persistent pancolitis, sepsis improved on Zosyn, Still with severe perisistent Diarrhea, GI following, Cdiff and GI pathogen panel negative. Considering Flex-sig, started Imodium 4/14  Assessment/Plan: Sepsis -present on admission -improving, afebrile now, mentation improved -? Due to recurrent colitis -continue Zosyn Day6, IVf, CT with pancolitis unchanged from 3 weeks ago -could change to PO CIpro/flagyl in 1-2days -Blood Cx negative -GI pathogen panel negative, Cdiff negative -Recent colitis/Cdiff negative, treated with Cipro/Flagyl then  Diarrhea -chronic issue, ongoing for several months, but acutely worse -7 BMs 4/11 and 7BMs overnight 4/12-13, 8 BMs overnight 4/13-now -recent pancolitis on CT, repeat CT unchanged -suspect multifactorial, ?radiation procto-colitis , following XRT to Anus for CA -?malabsorption, bowel edema  -since GI pathogen panel negative,  add Imodium -Continue Questran and floraster -Greatly appreciate GI assistance, considering Flex Sig-Thank you -tolerating full liquid, advised her to consider Flex Sig due to severity and chronicity of diarrhea  Dehydration  - continue IVF, till diarrhea slows  Protein-calorie malnutrition, severe  - Rd consult  History of Anal carcinoma  - Status post radiation treatment   Adult failure to thrive in setting of advanced age,severe malnutrition, recurrent hospitalization, chronic diarrhea, Anal CA  Hypokalemia -due to diarrhea,  replace  DVT prophylaxis: Lovenox   CODE STATUS: DNR Family Communication:d/w son, long discussion earlier this admission about declining status, agreeable to DNR now, no family at bedside now Dispo: inpatient    Consultants:  Onc  Antibiotics:  zosyn  HPI/Subjective: Still with diarrhea, 8 episodes overnight and this am, otherwise feels ok, drinking some  Objective: Filed Vitals:   05/22/13 0732  BP: 146/71  Pulse: 80  Temp: 98.6 F (37 C)  Resp: 24    Intake/Output Summary (Last 24 hours) at 05/22/13 1104 Last data filed at 05/22/13 0400  Gross per 24 hour  Intake   3615 ml  Output   1750 ml  Net   1865 ml   Filed Weights   05/20/13 0500 05/21/13 0415 05/22/13 0505  Weight: 45.4 kg (100 lb 1.4 oz) 45.4 kg (100 lb 1.4 oz) 44.9 kg (98 lb 15.8 oz)    Exam:   General:  Awake, alert, talking and appropriate  Cardiovascular: S1s2/RRR  Respiratory: CTAB  Abdomen: soft, Non tender, BS diminsihed   Musculoskeletal: no edema c/c   Data Reviewed: Basic Metabolic Panel:  Recent Labs Lab 05/17/13 0359 05/18/13 0926 05/19/13 0429 05/20/13 0458 05/21/13 0337  NA 137 137 140 139 140  K 3.2* 3.1* 3.4* 3.9 3.4*  CL 100 102 109 109 110  CO2 24 23 21 22 20   GLUCOSE 88 96 77 78 78  BUN 8 6 3* <3* <3*  CREATININE 0.44* 0.42* 0.40* 0.46* 0.44*  CALCIUM 8.1* 7.4* 7.7* 7.9* 7.6*   Liver Function Tests:  Recent Labs Lab 05/16/13 2000  AST 29  ALT 8  ALKPHOS 99  BILITOT 0.3  PROT 7.1  ALBUMIN 3.4*    Recent Labs Lab 05/16/13 2000  LIPASE 62*    Recent Labs Lab 05/17/13 0840  AMMONIA 14   CBC:  Recent Labs Lab  05/16/13 2120 05/17/13 0359 05/18/13 0926 05/19/13 0429 05/20/13 0458  WBC 5.2 4.9 5.1 3.4* 3.4*  NEUTROABS 3.8  --   --   --   --   HGB 10.5* 8.5* 8.2* 7.8* 8.4*  HCT 33.2* 26.7* 25.0* 25.0* 26.6*  MCV 98.8 98.9 97.3 98.0 99.6  PLT 192 188 165 166 184   Cardiac Enzymes: No results found for this basename: CKTOTAL, CKMB,  CKMBINDEX, TROPONINI,  in the last 168 hours BNP (last 3 results) No results found for this basename: PROBNP,  in the last 8760 hours CBG: No results found for this basename: GLUCAP,  in the last 168 hours  Recent Results (from the past 240 hour(s))  CULTURE, BLOOD (ROUTINE X 2)     Status: None   Collection Time    05/16/13  9:20 PM      Result Value Ref Range Status   Specimen Description BLOOD LEFT FOREARM   Final   Special Requests BOTTLES DRAWN AEROBIC AND ANAEROBIC 4ML   Final   Culture  Setup Time     Final   Value: 05/17/2013 00:57     Performed at [facility]   Culture     Final   Value:        BLOOD CULTURE RECEIVED NO GROWTH TO DATE CULTURE WILL BE HELD FOR 5 DAYS BEFORE ISSUING A FINAL NEGATIVE REPORT     Performed at [facility]   Report Status PENDING   Incomplete  CULTURE, BLOOD (ROUTINE X 2)     Status: None   Collection Time    05/16/13  9:20 PM      Result Value Ref Range Status   Specimen Description BLOOD RIGHT FOREARM   Final   Special Requests BOTTLES DRAWN AEROBIC AND ANAEROBIC 4ML   Final   Culture  Setup Time     Final   Value: 05/17/2013 00:57     Performed at [facility]   Culture     Final   Value:        BLOOD CULTURE RECEIVED NO GROWTH TO DATE CULTURE WILL BE HELD FOR 5 DAYS BEFORE ISSUING A FINAL NEGATIVE REPORT     Performed at [facility]   Report Status PENDING   Incomplete  STOOL CULTURE     Status: None   Collection Time    05/18/13  8:38 PM      Result Value Ref Range Status   Specimen Description STOOL   Final   Special Requests NONE   Final   Culture     Final   Value: NO SALMONELLA, SHIGELLA, CAMPYLOBACTER, YERSINIA, OR E.COLI 0157:H7 ISOLATED     Note: REDUCED NORMAL FLORA PRESENT     Performed at [facility]   Report Status 05/22/2013 FINAL   Final  CLOSTRIDIUM DIFFICILE BY PCR     Status: None   Collection Time    05/18/13  8:38 PM      Result Value Ref Range Status   C difficile by  pcr NEGATIVE  NEGATIVE Final   Comment: Performed at [facility]     Studies: No results found.  Scheduled Meds: . amitriptyline  50 mg Oral QHS  . enoxaparin (LOVENOX) injection  30 mg Subcutaneous Q24H  . feeding supplement (RESOURCE BREEZE)  1 Container Oral BID BM  . leflunomide  20 mg Oral Daily  . metoprolol succinate  100 mg Oral QPM  . piperacillin-tazobactam (ZOSYN)  IV  3.375 g Intravenous 3 times per day  . potassium chloride SA  40 mEq Oral Daily  . predniSONE  5 mg Oral Daily  . saccharomyces boulardii  250 mg Oral BID   Continuous Infusions: . sodium chloride 0.9 % 1,000 mL with potassium chloride 40 mEq infusion 75 mL/hr at 05/21/13 1105   Antibiotics Given (last 72 hours)   Date/Time Action Medication Dose Rate   05/19/13 1355 Given   piperacillin-tazobactam (ZOSYN) IVPB 3.375 g 3.375 g 12.5 mL/hr   05/19/13 2234 Given   piperacillin-tazobactam (ZOSYN) IVPB 3.375 g 3.375 g 12.5 mL/hr   05/20/13 0536 Given   piperacillin-tazobactam (ZOSYN) IVPB 3.375 g 3.375 g 12.5 mL/hr   05/20/13 1322 Given   piperacillin-tazobactam (ZOSYN) IVPB 3.375 g 3.375 g 12.5 mL/hr   05/20/13 2208 Given   piperacillin-tazobactam (ZOSYN) IVPB 3.375 g 3.375 g 12.5 mL/hr   05/21/13 0350 Given   piperacillin-tazobactam (ZOSYN) IVPB 3.375 g 3.375 g 12.5 mL/hr   05/21/13 1400 Given  [Scanned pt and med, not sure why did not take it]   piperacillin-tazobactam (ZOSYN) IVPB 3.375 g 3.375 g 12.5 mL/hr   05/21/13 2150 Given   piperacillin-tazobactam (ZOSYN) IVPB 3.375 g 3.375 g 12.5 mL/hr   05/22/13 0518 Given   piperacillin-tazobactam (ZOSYN) IVPB 3.375 g 3.375 g 12.5 mL/hr      Principal Problem:   Diarrhea Active Problems:   Hiatal hernia   Dehydration   Protein-calorie malnutrition, severe   Pancolitis   Colitis   Nausea & vomiting   Sepsis    Time spent: 40min    [place] [facility] Pager [phone]. If 7PM-7AM, please contact night-coverage at  [email or web address], password [facility] 05/22/2013, 11:04 AM  LOS: 6 days

## 2013-05-23 DIAGNOSIS — E43 Unspecified severe protein-calorie malnutrition: Secondary | ICD-10-CM

## 2013-05-23 DIAGNOSIS — R112 Nausea with vomiting, unspecified: Secondary | ICD-10-CM

## 2013-05-23 DIAGNOSIS — K5100 Ulcerative (chronic) pancolitis without complications: Secondary | ICD-10-CM

## 2013-05-23 LAB — BASIC METABOLIC PANEL
CO2: 23 meq/L (ref 19–32)
Calcium: 9.2 mg/dL (ref 8.4–10.5)
Chloride: 109 mEq/L (ref 96–112)
Creatinine, Ser: 0.45 mg/dL — ABNORMAL LOW (ref 0.50–1.10)
GFR calc Af Amer: >90 mL/min (ref >90)
GFR calc non Af Amer: 89 mL/min — ABNORMAL LOW (ref >90)
GLUCOSE: 78 mg/dL (ref 70–99)
POTASSIUM: 4.5 meq/L (ref 3.7–5.3)
Sodium: 142 mEq/L (ref 137–147)

## 2013-05-23 LAB — CULTURE, BLOOD (ROUTINE X 2)
CULTURE: NO GROWTH
Culture: NO GROWTH

## 2013-05-23 MED ORDER — CIPROFLOXACIN HCL 250 MG PO TABS
250.0000 mg | ORAL_TABLET | Freq: Two times a day (BID) | ORAL | Status: DC
  Administered 2013-05-23 – 2013-05-25 (×4): 250 mg via ORAL
  Filled 2013-05-23 (×6): qty 1

## 2013-05-23 MED ORDER — METRONIDAZOLE 500 MG PO TABS
500.0000 mg | ORAL_TABLET | Freq: Three times a day (TID) | ORAL | Status: DC
  Administered 2013-05-23 – 2013-05-25 (×6): 500 mg via ORAL
  Filled 2013-05-23 (×9): qty 1

## 2013-05-23 NOTE — Progress Notes (Deleted)
OT Cancellation Note  Patient Details Name: [name] MRN: [id number] DOB: 10/24/1925   Cancelled Treatment:    Reason Eval/Treat Not Completed: Patient at procedure or test/ unavailable (pt off the unit)  Cheresa Siers A Derwin Reddy 05/23/2013, 2:35 PM

## 2013-05-23 NOTE — Progress Notes (Signed)
Occupational Therapy Treatment Patient Details Name: [name] MRN: [id number] DOB: August 01, 1925 Today's Date: 05/23/2013    History of present illness Pt is an 80 year old female recently discharged on 05/03/2013 with pancolitis, re-admitted 4/08 for intractable diarrhea; PMH of arthritis noted at hands and feet, CAD, CHF, HTN, and rectal CA   Follow Up Recommendations  Supervision/Assistance - 24 hour;Home health OT    Equipment Recommendations  None recommended by OT    Recommendations for Other Services      Precautions / Restrictions Precautions Precautions: Fall        ADL Overall ADL's : Needs assistance/impaired                         Toilet Transfer: Minimal assistance;BSC;Stand-pivot   Toileting- Clothing Manipulation and Hygiene: Minimal assistance;Sit to/from stand         General ADL Comments: Patient also performed toilet hygiene with S seated on BSC.       Cognition   Behavior During Therapy: WFL for tasks assessed/performed Overall Cognitive Status: Within Functional Limits for tasks assessed                        Pertinent Vitals/ Pain       No c/o pain   Frequency Min 2X/week     Progress Toward Goals  OT Goals(current goals can now be found in the care plan section)  Progress towards OT goals: Progressing toward goals     Plan Discharge plan remains appropriate    End of Session     Activity Tolerance Patient tolerated treatment well   Patient Left in bed;with call bell/phone within reach;with bed alarm set   Nurse Communication          Time: 1441-1455 OT Time Calculation (min): 14 min  Charges: OT General Charges $OT Visit: 1 Procedure OT Treatments $Self Care/Home Management : 8-22 mins  Rickayla Wieland A Emberlyn Burlison 05/23/2013, 3:07 PM

## 2013-05-23 NOTE — Progress Notes (Signed)
[facility] PROGRESS NOTE  [name] TMH:[id number] DOB: 1925-05-06 DOA: 05/16/2013 PCP: Andreas Blower, MD  Interim summary:  Patient is 87/F with h/o CAD, hypertension, Anal Ca s/p XRT in 11/14 and CHF, who was recently discharged on 05/03/2013, after treatment for pancolitis with Cipro/Flagyl course which she completed, She was admitted with worsening of her chronic diarrhea, over 6-7 weeks, fever and then developed metabolic encephalopathy from this, repeat CT showed persistent pancolitis, sepsis improved on Zosyn, Still with severe perisistent Diarrhea, GI following, Cdiff and GI pathogen panel negative. Considering Flex-sig, started Imodium 4/14  Assessment/Plan: Principal Problem:   Diarrhea: Chronic issue, ongoing for several months but acutely worse in the setting of colitis. CT scan done on admission note unchanged pancolitis. 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 GI pathogen panel negative so have started Imodium. Have also added Questran and florastor. She seems to be showing some improvement. Advancing diet to solids. GI consulted and with clinical improvement, patient has declined flexible sigmoidoscopy which is appropriate.  Active Problems:   Hiatal hernia   Dehydration: On IV fluids. Now that diarrhea is slowing down, will discontinue   Protein-calorie malnutrition, severe:Pt meets criteria for severe MALNUTRITION in the context of chronic illness as evidenced by 10% weight loss in 3 month period, PO intake <75% for > one month, severe muscle wasting and subcutaneous fat loss. Have started resource breeze twice a day    Pancolitis: See above    Nausea & vomiting: Secondary pancolitis. Better.    Sepsis: Resolved. Present on admission, due to recurrent colitis. We'll change to by mouth Cipro and Flagyl. Blood cultures negative. C. difficile  negative.   Code Status: DO NOT RESUSCITATE  Family Communication: Left message with family Disposition Plan: Likely home tomorrow   Consultants:  Gastroenterology  Procedures:  None  Antibiotics:   IV Zosyn 4/9-4/15  Cipro and Flagyl by mouth 4/15-present  HPI/Subjective: Patient doing much better. Denies any abdominal pain. Stools are starting to become a little bit more solid  Objective: Filed Vitals:   05/23/13 1418  BP: 140/72  Pulse: 86  Temp: 98.4 F (36.9 C)  Resp: 20    Intake/Output Summary (Last 24 hours) at 05/23/13 1504 Last data filed at 05/23/13 0740  Gross per 24 hour  Intake 2396.25 ml  Output    651 ml  Net 1745.25 ml   Filed Weights   05/21/13 0415 05/22/13 0505 05/23/13 0610  Weight: 45.4 kg (100 lb 1.4 oz) 44.9 kg (98 lb 15.8 oz) 44.7 kg (98 lb 8.7 oz)    Exam:   General:  Alert and oriented x2, no acute distress, fatigued  Cardiovascular: Regular rate and rhythm, S1-S2  Respiratory: Clear to auscultation bilaterally  Abdomen: Soft, nontender, nondistended, normoactive bowel sounds  Musculoskeletal: No clubbing or cyanosis or edema   Data Reviewed: Basic Metabolic Panel:  Recent Labs Lab 05/18/13 0926 05/19/13 0429 05/20/13 0458 05/21/13 0337 05/23/13 0401  NA 137 140 139 140 142  K 3.1* 3.4* 3.9 3.4* 4.5  CL 102 109 109 110 109  CO2 23 21 22 20 23   GLUCOSE 96 77 78 78 78  BUN 6 3* <3* <3* <3*  CREATININE 0.42* 0.40* 0.46* 0.44* 0.45*  CALCIUM 7.4* 7.7* 7.9* 7.6* 9.2   Liver Function Tests:  Recent Labs Lab 05/16/13 2000  AST 29  ALT 8  ALKPHOS 99  BILITOT 0.3  PROT 7.1  ALBUMIN  3.4*    Recent Labs Lab 05/16/13 2000  LIPASE 62*    Recent Labs Lab 05/17/13 0840  AMMONIA 14   CBC:  Recent Labs Lab 05/16/13 2120 05/17/13 0359 05/18/13 0926 05/19/13 0429 05/20/13 0458  WBC 5.2 4.9 5.1 3.4* 3.4*  NEUTROABS 3.8  --   --   --   --   HGB 10.5* 8.5* 8.2* 7.8* 8.4*  HCT 33.2* 26.7* 25.0*  25.0* 26.6*  MCV 98.8 98.9 97.3 98.0 99.6  PLT 192 188 165 166 184   Cardiac Enzymes: No results found for this basename: CKTOTAL, CKMB, CKMBINDEX, TROPONINI,  in the last 168 hours BNP (last 3 results) No results found for this basename: PROBNP,  in the last 8760 hours CBG: No results found for this basename: GLUCAP,  in the last 168 hours     Studies: No results found.  Scheduled Meds: . amitriptyline  50 mg Oral QHS  . enoxaparin (LOVENOX) injection  30 mg Subcutaneous Q24H  . feeding supplement (RESOURCE BREEZE)  1 Container Oral BID BM  . leflunomide  20 mg Oral Daily  . metoprolol succinate  100 mg Oral QPM  . piperacillin-tazobactam (ZOSYN)  IV  3.375 g Intravenous 3 times per day  . potassium chloride SA  40 mEq Oral Daily  . predniSONE  5 mg Oral Daily  . saccharomyces boulardii  250 mg Oral BID   Continuous Infusions: . sodium chloride 0.9 % 1,000 mL with potassium chloride 40 mEq infusion 75 mL/hr at 05/23/13 1259    Principal Problem:   Diarrhea Active Problems:   Hiatal hernia   Dehydration   Protein-calorie malnutrition, severe   Pancolitis   Colitis   Nausea & vomiting   Sepsis    Time spent: 25 minutes    Sendil Wynelle Link  [facility] Pager [phone]. If 7PM-7AM, please contact night-coverage at [email or web address], password [facility] 05/23/2013, 3:04 PM  LOS: 7 days

## 2013-05-24 DIAGNOSIS — I10 Essential (primary) hypertension: Secondary | ICD-10-CM

## 2013-05-24 DIAGNOSIS — M069 Rheumatoid arthritis, unspecified: Secondary | ICD-10-CM

## 2013-05-24 LAB — CBC
HCT: 30.6 % — ABNORMAL LOW (ref 36.0–46.0)
HEMOGLOBIN: 9.6 g/dL — AB (ref 12.0–15.0)
MCH: 31.5 pg (ref 26.0–34.0)
MCHC: 31.4 g/dL (ref 30.0–36.0)
MCV: 100.3 fL — AB (ref 78.0–100.0)
Platelets: 220 10*3/uL (ref 150–400)
RBC: 3.05 MIL/uL — ABNORMAL LOW (ref 3.87–5.11)
RDW: 16.9 % — AB (ref 11.5–15.5)
WBC: 4.9 10*3/uL (ref 4.0–10.5)

## 2013-05-24 LAB — BASIC METABOLIC PANEL
BUN: 4 mg/dL — ABNORMAL LOW (ref 6–23)
CO2: 25 meq/L (ref 19–32)
CREATININE: 0.53 mg/dL (ref 0.50–1.10)
Calcium: 8.8 mg/dL (ref 8.4–10.5)
Chloride: 108 mEq/L (ref 96–112)
GFR calc Af Amer: >90 mL/min (ref >90)
GFR calc non Af Amer: 84 mL/min — ABNORMAL LOW (ref >90)
GLUCOSE: 79 mg/dL (ref 70–99)
Potassium: 5.1 mEq/L (ref 3.7–5.3)
Sodium: 142 mEq/L (ref 137–147)

## 2013-05-24 LAB — PRO B NATRIURETIC PEPTIDE: Pro B Natriuretic peptide (BNP): 2482 pg/mL — ABNORMAL HIGH (ref 0–450)

## 2013-05-24 NOTE — Progress Notes (Signed)
[facility] PROGRESS NOTE  [name] LZJ:[id number] DOB: 11/09/1925 DOA: 05/16/2013 PCP: Andreas Blower, MD  Interim summary:  Patient is 87/F with h/o CAD, hypertension, Anal Ca s/p XRT in 11/14 and CHF, who was recently discharged on 05/03/2013, after treatment for pancolitis with Cipro/Flagyl course which she completed, She was admitted with worsening of her chronic diarrhea, over 6-7 weeks, fever and then developed metabolic encephalopathy from this, repeat CT showed persistent pancolitis, sepsis improved on Zosyn, Still with severe perisistent Diarrhea, GI following, Cdiff and GI pathogen panel negative. Considering Flex-sig, started Imodium 4/14  Assessment/Plan: Principal Problem:   Diarrhea: Chronic issue, ongoing for several months but acutely worse in the setting of colitis. CT scan done on admission note unchanged pancolitis. 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 GI pathogen panel negative so have started Imodium. Have also added Questran and florastor. She seems to be showing some improvement. Advancing diet to solids. GI consulted and with clinical improvement, patient has declined flexible sigmoidoscopy which is appropriate.  Active Problems:   Hiatal hernia   Dehydration: On IV fluids. Now that diarrhea is slowing down, will discontinue.      Protein-calorie malnutrition, severe:Pt meets criteria for severe MALNUTRITION in the context of chronic illness as evidenced by 10% weight loss in 3 month period, PO intake <75% for > one month, severe muscle wasting and subcutaneous fat loss. Have started resource breeze twice a day  Hypertension: Patient's heart rate running at 99 despite her home dose of Toprol. She is 7 L ahead resuscitation. We'll check BNP.    Pancolitis: See above    Nausea & vomiting: Secondary pancolitis. Better.    Sepsis:  Resolved. Present on admission, due to recurrent colitis. We'll change to by mouth Cipro and Flagyl. Blood cultures negative. C. difficile negative.  Rheumatoid arthritis: On home dose of daily prednisone   Code Status: DO NOT RESUSCITATE  Family Communication: Left message with her son Disposition Plan: Home tomorrow   Consultants:  Gastroenterology  Procedures:  None  Antibiotics:   IV Zosyn 4/9-4/15  Cipro and Flagyl by mouth 4/15-4/18  HPI/Subjective: Patient doing much better. Minimal diarrhea, no bowel movement  Objective: Filed Vitals:   05/24/13 1350  BP: 135/79  Pulse: 97  Temp: 98.1 F (36.7 C)  Resp: 17    Intake/Output Summary (Last 24 hours) at 05/24/13 1436 Last data filed at 05/24/13 0039  Gross per 24 hour  Intake      0 ml  Output    200 ml  Net   -200 ml   Filed Weights   05/22/13 0505 05/23/13 0610 05/24/13 0630  Weight: 44.9 kg (98 lb 15.8 oz) 44.7 kg (98 lb 8.7 oz) 44.5 kg (98 lb 1.7 oz)    Exam:   General:  Alert and oriented x2, no acute distress, fatigued  Cardiovascular: Regular rate and rhythm, S1-S2  Respiratory: Clear to auscultation bilaterally  Abdomen: Soft, nontender, nondistended, normoactive bowel sounds  Musculoskeletal: No clubbing or cyanosis or edema . Signs of chronic rheumatoid arthritis  Data Reviewed: Basic Metabolic Panel:  Recent Labs Lab 05/19/13 0429 05/20/13 0458 05/21/13 0337 05/23/13 0401 05/24/13 0354  NA 140 139 140 142 142  K 3.4* 3.9 3.4* 4.5 5.1  CL 109 109 110 109 108  CO2 21 22 20 23 25   GLUCOSE 77 78 78 78 79  BUN 3* <3* <3* <3* 4*  CREATININE 0.40* 0.46*  0.44* 0.45* 0.53  CALCIUM 7.7* 7.9* 7.6* 9.2 8.8   Liver Function Tests: No results found for this basename: AST, ALT, ALKPHOS, BILITOT, PROT, ALBUMIN,  in the last 168 hours No results found for this basename: LIPASE, AMYLASE,  in the last 168 hours No results found for this basename: AMMONIA,  in the last 168  hours CBC:  Recent Labs Lab 05/18/13 0926 05/19/13 0429 05/20/13 0458 05/24/13 0354  WBC 5.1 3.4* 3.4* 4.9  HGB 8.2* 7.8* 8.4* 9.6*  HCT 25.0* 25.0* 26.6* 30.6*  MCV 97.3 98.0 99.6 100.3*  PLT 165 166 184 220   Cardiac Enzymes: No results found for this basename: CKTOTAL, CKMB, CKMBINDEX, TROPONINI,  in the last 168 hours BNP (last 3 results) No results found for this basename: PROBNP,  in the last 8760 hours CBG: No results found for this basename: GLUCAP,  in the last 168 hours     Studies: No results found.  Scheduled Meds: . amitriptyline  50 mg Oral QHS  . ciprofloxacin  250 mg Oral BID  . enoxaparin (LOVENOX) injection  30 mg Subcutaneous Q24H  . feeding supplement (RESOURCE BREEZE)  1 Container Oral BID BM  . leflunomide  20 mg Oral Daily  . metoprolol succinate  100 mg Oral QPM  . metroNIDAZOLE  500 mg Oral 3 times per day  . predniSONE  5 mg Oral Daily  . saccharomyces boulardii  250 mg Oral BID   Continuous Infusions:    Principal Problem:   Diarrhea Active Problems:   Hiatal hernia   Dehydration   Protein-calorie malnutrition, severe   Pancolitis   Colitis   Nausea & vomiting   Sepsis    Time spent: 20 minutes    Sendil K [facility] Pager [phone]. If 7PM-7AM, please contact night-coverage at [email or web address], password [facility] 05/24/2013, 2:36 PM  LOS: 8 days

## 2013-05-24 NOTE — Progress Notes (Signed)
Physical Therapy Treatment Patient Details Name: [name] MRN: [id number] DOB: 1925/12/28 Today's Date: 05/24/2013    History of Present Illness Pt is an 80 year old female recently discharged on 05/03/2013 with pancolitis, re-admitted 4/08 for intractable diarrhea; PMH of arthritis noted at hands and feet, CAD, CHF, HTN, and rectal CA    PT Comments    Pt ambulated in hallway with 4-wheeled walker with one standing rest break.  Pt reports this is further than she typically walks at baseline, however had no complains of fatigue or pain while walking.  Pt performed LE exercises in bed.  Pt is eager to return home.  Follow Up Recommendations  Home health PT     Equipment Recommendations  None recommended by PT    Recommendations for Other Services       Precautions / Restrictions Precautions Precautions: Fall Restrictions Weight Bearing Restrictions: No    Mobility  Bed Mobility Overal bed mobility: Needs Assistance Bed Mobility: Supine to Sit;Sit to Supine     Supine to sit: Supervision Sit to supine: Supervision   General bed mobility comments: verbal cues for safety; pt able to perform bed mobility with more ease  Transfers Overall transfer level: Needs assistance Equipment used: 4-wheeled walker Transfers: Sit to/from Stand Sit to Stand: Min guard         General transfer comment: verbal cues for safety, reminded to have walker in position before standing  Ambulation/Gait Ambulation/Gait assistance: Supervision Ambulation Distance (Feet): 160 Feet Assistive device: 4-wheeled walker Gait Pattern/deviations: Step-through pattern;Decreased stride length;Narrow base of support Gait velocity: decr   General Gait Details: per pt, she typically takes rest breaks at home, therefore took 1 standing break    Stairs            Wheelchair Mobility    Modified Rankin (Stroke Patients Only)       Balance                                    Cognition Arousal/Alertness: Awake/alert Behavior During Therapy: WFL for tasks assessed/performed Overall Cognitive Status: Within Functional Limits for tasks assessed                      Exercises General Exercises - Lower Extremity Ankle Circles/Pumps: AROM;Both;10 reps;Seated Quad Sets: AROM;Both;10 reps;Seated Gluteal Sets: AROM;Both;10 reps;Seated Long Arc Quad: AROM;Both;10 reps;Seated Heel Slides: AROM;Both;10 reps;Seated Hip ABduction/ADduction: AROM;Both;10 reps;Seated Hip Flexion/Marching: AROM;Both;10 reps;Seated Other Exercises Other Exercises: Towel Squeeze; AROM; Both; 10 reps; Seated    General Comments        Pertinent Vitals/Pain Only mild complaints of stiffness in arthritic joints including knees and feet.  Activity to tolerance.    Home Living                      Prior Function            PT Goals (current goals can now be found in the care plan section) Progress towards PT goals: Progressing toward goals    Frequency  Min 3X/week    PT Plan Current plan remains appropriate    Co-evaluation             End of Session Equipment Utilized During Treatment: Gait belt Activity Tolerance: Patient tolerated treatment well Patient left: in bed;with call bell/phone within reach;with bed alarm set     Time: 5284-1324 PT Time  Calculation (min): 23 min  Charges:  $Gait Training: 8-22 mins $Therapeutic Exercise: 8-22 mins                    G Codes:      Jacqulyn Cane 06-05-13, 1:38 PM Jacqulyn Cane SPT 06/05/2013

## 2013-05-24 NOTE — Progress Notes (Signed)
I have reviewed this note and agree with all findings. Kati Daphanie Oquendo, PT, DPT Pager: [phone]   

## 2013-05-24 NOTE — Progress Notes (Signed)
Moderate improvement with the help of Imodium. Substantially less diarrhea.    Recommend:  1. Continue Imodium indefinitely, as needed  2. Stop Cipro as soon as feasible; would probably continue Florastor for at least two weeks following the cessation of antibiotics, to help prevent delayed C. difficile infection.  3. Today's progress note from the hospitalist indicates that the patient is on Questran, but I do not see where she is on that medication any longer. In any event, I would probably try to get by without that medication, simply because it is somewhat difficult to time the doses 2 hours away from all her other medications.  4. I will sign off at this time, but please call if we can be of further assistance in this patient's care.  Cleotis Nipper, M.D. [phone]

## 2013-05-24 NOTE — Progress Notes (Signed)
Met with pt at bedside to discuss home health needs. Pt stated she lives with her son who assists her if needs be. She stated "I do fine at home until I come here". She declined to have home health services set up. She is alert and oriented x3.   , RN BSN  [phone]  

## 2013-05-25 DIAGNOSIS — I059 Rheumatic mitral valve disease, unspecified: Secondary | ICD-10-CM

## 2013-05-25 DIAGNOSIS — I5031 Acute diastolic (congestive) heart failure: Secondary | ICD-10-CM | POA: Diagnosis present

## 2013-05-25 MED ORDER — FUROSEMIDE 10 MG/ML IJ SOLN
20.0000 mg | Freq: Two times a day (BID) | INTRAMUSCULAR | Status: DC
  Administered 2013-05-25 – 2013-05-26 (×3): 20 mg via INTRAVENOUS
  Filled 2013-05-25 (×5): qty 2

## 2013-05-25 MED ORDER — ENSURE COMPLETE PO LIQD
237.0000 mL | Freq: Every morning | ORAL | Status: DC
  Administered 2013-05-26 – 2013-05-28 (×3): 237 mL via ORAL

## 2013-05-25 NOTE — Progress Notes (Signed)
*  PRELIMINARY RESULTS* Echocardiogram 2D Echocardiogram has been performed.  Elvia Collum 05/25/2013, 4:26 PM

## 2013-05-25 NOTE — Progress Notes (Signed)
[facility] PROGRESS NOTE  [name] WCH:[id number] DOB: Jul 08, 1925 DOA: 05/16/2013 PCP: Andreas Blower, MD  Interim summary:  Patient is 87/F with h/o CAD, hypertension, Anal Ca s/p XRT in 11/14 and CHF, who was recently discharged on 05/03/2013, after treatment for pancolitis with Cipro/Flagyl course which she completed, She was admitted with worsening of her chronic diarrhea, over 6-7 weeks, fever and then developed metabolic encephalopathy from this, repeat CT showed persistent pancolitis, sepsis improved on Zosyn C. difficile cultures negative. GI following. GI pathogen panel negative. Patient responded well to Imodium. Has completed full antibiotic course.  Prior to discharge, patient noted to be borderline tachycardic, despite being on home dose of Lopressor. BNP checked on morning of 4/17   found to be elevated at 2500. Weight reviewed and patient 4 pounds heavier since admission. Started on IV Lasix. Echocardiogram ordered.   Assessment/Plan: Principal Problem:   Diarrhea: Chronic issue, ongoing for several months but acutely worse in the setting of colitis. CT scan done on admission note unchanged pancolitis. 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 GI pathogen panel negative so have started Imodium. Have also added  florastor. She seems to be showing some improvement. Advanced diet to solids. GI consulted and with clinical improvement, patient has declined flexible sigmoidoscopy which is appropriate.  Active Problems:   Hiatal hernia   Dehydration: On IV fluids. Now that diarrhea is slowing down, will discontinue.      Protein-calorie malnutrition, severe:Pt meets criteria for severe MALNUTRITION in the context of chronic illness as evidenced by 10% weight loss in 3 month period, PO intake <75% for > one month, severe muscle wasting and subcutaneous fat  loss. Have started resource breeze twice a day  Hypertension: Patient's heart rate running at 99 despite her home dose of Toprol. She is 7 L ahead on fluid resuscitation. BNP elevated and so have started Lasix.    Pancolitis: See above    Nausea & vomiting: Secondary pancolitis. Better.    Sepsis: Resolved. Present on admission, due to recurrent colitis. Completed full course of antibiotics Blood cultures negative. C. difficile negative.  Acute diastolic heart failure: Checking echocardiogram. Has IV Lasix plus strict input/output  Rheumatoid arthritis: On home dose of daily prednisone   Code Status: DO NOT RESUSCITATE  Family Communication: Left message with her son Disposition Plan: Home tomorrow likely   Consultants:  Gastroenterology  Procedures:  None  Antibiotics:   IV Zosyn 4/9-4/15  Cipro and Flagyl by mouth 4/15-4/1 17  HPI/Subjective: Patient doing okay. Admits to getting easily winded with ambulation  Objective: Filed Vitals:   05/25/13 1330  BP: 115/77  Pulse: 111  Temp: 97.8 F (36.6 C)  Resp: 18    Intake/Output Summary (Last 24 hours) at 05/25/13 1413 Last data filed at 05/25/13 1330  Gross per 24 hour  Intake    480 ml  Output   1200 ml  Net   -720 ml   Filed Weights   05/23/13 0610 05/24/13 0630 05/25/13 3536  Weight: 44.7 kg (98 lb 8.7 oz) 44.5 kg (98 lb 1.7 oz) 44.2 kg (97 lb 7.1 oz)    Exam:   General:  Alert and oriented x2, no acute distress, fatigued  Cardiovascular: Regular rate and rhythm, S1-S2, borderline tachycardia   Respiratory: Clear to auscultation bilaterally  Abdomen: Soft, nontender, nondistended, normoactive bowel sounds  Musculoskeletal: No clubbing or cyanosis or edema . Signs of chronic rheumatoid  arthritis  Data Reviewed: Basic Metabolic Panel:  Recent Labs Lab 05/19/13 0429 05/20/13 0458 05/21/13 0337 05/23/13 0401 05/24/13 0354  NA 140 139 140 142 142  K 3.4* 3.9 3.4* 4.5 5.1  CL 109 109 110  109 108  CO2 21 22 20 23 25   GLUCOSE 77 78 78 78 79  BUN 3* <3* <3* <3* 4*  CREATININE 0.40* 0.46* 0.44* 0.45* 0.53  CALCIUM 7.7* 7.9* 7.6* 9.2 8.8   Liver Function Tests: No results found for this basename: AST, ALT, ALKPHOS, BILITOT, PROT, ALBUMIN,  in the last 168 hours No results found for this basename: LIPASE, AMYLASE,  in the last 168 hours No results found for this basename: AMMONIA,  in the last 168 hours CBC:  Recent Labs Lab 05/19/13 0429 05/20/13 0458 05/24/13 0354  WBC 3.4* 3.4* 4.9  HGB 7.8* 8.4* 9.6*  HCT 25.0* 26.6* 30.6*  MCV 98.0 99.6 100.3*  PLT 166 184 220   Cardiac Enzymes: No results found for this basename: CKTOTAL, CKMB, CKMBINDEX, TROPONINI,  in the last 168 hours BNP (last 3 results)  Recent Labs  05/24/13 1500  PROBNP 2482.0*   CBG: No results found for this basename: GLUCAP,  in the last 168 hours     Studies: No results found.  Scheduled Meds: . amitriptyline  50 mg Oral QHS  . enoxaparin (LOVENOX) injection  30 mg Subcutaneous Q24H  . feeding supplement (RESOURCE BREEZE)  1 Container Oral BID BM  . furosemide  20 mg Intravenous Q12H  . leflunomide  20 mg Oral Daily  . metoprolol succinate  100 mg Oral QPM  . predniSONE  5 mg Oral Daily  . saccharomyces boulardii  250 mg Oral BID   Continuous Infusions:    Principal Problem:   Diarrhea Active Problems:   Hiatal hernia   Dehydration   Protein-calorie malnutrition, severe   Pancolitis   Colitis   Nausea & vomiting   Sepsis    Time spent:35 minutes    Annita Brod  [facility] Pager [phone]. If 7PM-7AM, please contact night-coverage at [email or web address], password [facility] 05/25/2013, 2:13 PM  LOS: 9 days

## 2013-05-25 NOTE — Progress Notes (Signed)
Nutrition Note   -Pt's loose stools improving with Imodium. Recommended pt gradually add dairy foods back into diet for additional protein sources (yogurt, milk) -Has been drinking [employer] but prefers taste of Ensure/Boost. Will modify supplement order to provide pt with additional kcal/protein -Discussed different foods to assist in formed stools (bananas, rice, applesauce, toast) -PO intake improving, approximately 50% -Likely d/c home tomorrow  Following per protocols. Please re-consult as needed [place] Clinical Dietitian [phone]

## 2013-05-26 LAB — BASIC METABOLIC PANEL
BUN: 11 mg/dL (ref 6–23)
CALCIUM: 9.1 mg/dL (ref 8.4–10.5)
CO2: 27 mEq/L (ref 19–32)
Chloride: 96 mEq/L (ref 96–112)
Creatinine, Ser: 0.4 mg/dL — ABNORMAL LOW (ref 0.50–1.10)
GFR calc Af Amer: >90 mL/min (ref >90)
GLUCOSE: 102 mg/dL — AB (ref 70–99)
Potassium: 3.1 mEq/L — ABNORMAL LOW (ref 3.7–5.3)
Sodium: 139 mEq/L (ref 137–147)

## 2013-05-26 LAB — MAGNESIUM: Magnesium: 1.8 mg/dL (ref 1.5–2.5)

## 2013-05-26 MED ORDER — SODIUM CHLORIDE 0.9 % IV BOLUS (SEPSIS)
250.0000 mL | Freq: Once | INTRAVENOUS | Status: AC
  Administered 2013-05-26: 250 mL via INTRAVENOUS

## 2013-05-26 MED ORDER — POTASSIUM CHLORIDE 20 MEQ/15ML (10%) PO LIQD
40.0000 meq | Freq: Once | ORAL | Status: AC
  Administered 2013-05-26: 40 meq via ORAL
  Filled 2013-05-26: qty 30

## 2013-05-26 MED ORDER — FUROSEMIDE 20 MG PO TABS
20.0000 mg | ORAL_TABLET | Freq: Every day | ORAL | Status: DC
  Administered 2013-05-27 – 2013-05-29 (×3): 20 mg via ORAL
  Filled 2013-05-26 (×4): qty 1

## 2013-05-26 NOTE — Progress Notes (Addendum)
[facility] PROGRESS NOTE  [name] OZH:[id number] DOB: 25-Jan-1926 DOA: 05/16/2013 PCP: Tula Nakayama, MD  Interim summary:  I seen and examined [name] at bedside in the presence of her son and reviewed her chart. Patient is 87/F with h/o CAD, hypertension, Anal Ca s/p XRT in 11/14 and CHF, who was recently discharged on 05/03/2013, after treatment for pancolitis with Cipro/Flagyl course which she completed, She was admitted with worsening of her chronic diarrhea, over 6-7 weeks, fever and then developed metabolic encephalopathy from this, repeat CT showed persistent pancolitis, sepsis improved on Zosyn, Still with severe perisistent Diarrhea, GI signed off on 05/25/13, Cdiff and GI pathogen panel negative. Patient reportedly refused Flex-sig. She was started on Imodium 4/14. She feels lethargic and has continued to have tachycardia. She is hypokalemic with potassium of 3.1. 2-D echocardiogram showed normal ejection fraction with mild to moderate aortic stenosis. She may be on the dry side. Will therefore rehydrate and replenish potassium. Assessment/Plan:  Principal Problem:  Diarrhea: Chronic issue, ongoing for several months but acutely worse in the setting of colitis. CT scan done on admission note unchanged pancolitis. 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 GI pathogen panel negative so have started Imodium. Continue florastor per GI recommendations. She seems to be showing some improvement. Tolerating solids. Active Problems:  Hiatal hernia  Dehydration/hypokalemia: Give IV fluids. Replenish potassium. Discontinue IV Lasix. Discontinue Foley catheter. Protein-calorie malnutrition, severe:Pt meets criteria for severe MALNUTRITION in the context of chronic illness as evidenced by 10% weight loss in 3 month period, PO intake <75% for > one month, severe muscle  wasting and subcutaneous fat loss. Have started resource breeze twice a day  Pancolitis: See above  Nausea & vomiting: Secondary pancolitis. Better.  Sepsis: Resolved. Present on admission, due to recurrent colitis. Antibiotics Discontinued today. Blood cultures negative. C. difficile negative.  Code Status: DO NOT RESUSCITATE  Family Communication: Spoke with son at bedside. Disposition Plan: Likely home once patient feels stronger, hopefully after replenishment of potassium and gentle rehydration Consultants:  Gastroenterology Procedures:  None Antibiotics:  IV Zosyn 4/9-4/15  Cipro and Flagyl by mouth 4/15-present.  Discontinued today.   HPI/Subjective: This week. Still has diarrhea. Denies cough.  Objective: Filed Vitals:   05/26/13 0533  BP: 129/71  Pulse: 102  Temp: 97.7 F (36.5 C)  Resp: 18    Intake/Output Summary (Last 24 hours) at 05/26/13 1322 Last data filed at 05/26/13 0825  Gross per 24 hour  Intake    720 ml  Output   2400 ml  Net  -1680 ml   Filed Weights   05/24/13 0630 05/25/13 0608 05/26/13 0533  Weight: 44.5 kg (98 lb 1.7 oz) 44.2 kg (97 lb 7.1 oz) 42.6 kg (93 lb 14.7 oz)    Exam:   General:  Comfortably to the  Cardiovascular: Ejection systolic murmur throughout the precordium.S1S2 Normal. RRR.  Respiratory: Lungs clear  Abdomen: Soft and nontender.  Musculoskeletal: Normal.   Data Reviewed: Basic Metabolic Panel:  Recent Labs Lab 05/20/13 0458 05/21/13 0337 05/23/13 0401 05/24/13 0354 05/26/13 0333  NA 139 140 142 142 139  K 3.9 3.4* 4.5 5.1 3.1*  CL 109 110 109 108 96  CO2 22 20 23 25 27   GLUCOSE 78 78 78 79 102*  BUN <3* <3* <3* 4* 11  CREATININE 0.46* 0.44* 0.45* 0.53 0.40*  CALCIUM 7.9* 7.6* 9.2 8.8 9.1   Liver Function Tests:  No results found for this basename: AST, ALT, ALKPHOS, BILITOT, PROT, ALBUMIN,  in the last 168 hours No results found for this basename: LIPASE, AMYLASE,  in the last 168 hours No results  found for this basename: AMMONIA,  in the last 168 hours CBC:  Recent Labs Lab 05/20/13 0458 05/24/13 0354  WBC 3.4* 4.9  HGB 8.4* 9.6*  HCT 26.6* 30.6*  MCV 99.6 100.3*  PLT 184 220   Cardiac Enzymes: No results found for this basename: CKTOTAL, CKMB, CKMBINDEX, TROPONINI,  in the last 168 hours BNP (last 3 results)  Recent Labs  05/24/13 1500  PROBNP 2482.0*   CBG: No results found for this basename: GLUCAP,  in the last 168 hours  Recent Results (from the past 240 hour(s))  CULTURE, BLOOD (ROUTINE X 2)     Status: None   Collection Time    05/16/13  9:20 PM      Result Value Ref Range Status   Specimen Description BLOOD LEFT FOREARM   Final   Special Requests BOTTLES DRAWN AEROBIC AND ANAEROBIC   Final   Culture  Setup Time     Final   Value: 05/17/2013 00:57     Performed at [facility]   Culture     Final   Value: NO GROWTH 5 DAYS     Performed at [facility]   Report Status 05/23/2013 FINAL   Final  CULTURE, BLOOD (ROUTINE X 2)     Status: None   Collection Time    05/16/13  9:20 PM      Result Value Ref Range Status   Specimen Description BLOOD RIGHT FOREARM   Final   Special Requests BOTTLES DRAWN AEROBIC AND ANAEROBIC   Final   Culture  Setup Time     Final   Value: 05/17/2013 00:57     Performed at [facility]   Culture     Final   Value: NO GROWTH 5 DAYS     Performed at [facility]   Report Status 05/23/2013 FINAL   Final  STOOL CULTURE     Status: None   Collection Time    05/18/13  8:38 PM      Result Value Ref Range Status   Specimen Description STOOL   Final   Special Requests NONE   Final   Culture     Final   Value: NO SALMONELLA, SHIGELLA, CAMPYLOBACTER, YERSINIA, OR E.COLI 0157:H7 ISOLATED     Note: REDUCED NORMAL FLORA PRESENT     Performed at [facility]   Report Status 05/22/2013 FINAL   Final  CLOSTRIDIUM DIFFICILE BY PCR     Status: None   Collection Time    05/18/13  8:38 PM       Result Value Ref Range Status   C difficile by pcr NEGATIVE  NEGATIVE Final   Comment: Performed at [facility]  URINE CULTURE     Status: None   Collection Time    05/21/13  7:26 PM      Result Value Ref Range Status   Specimen Description URINE, CLEAN CATCH   Final   Special Requests Zosyn, Arava Normal   Final   Culture  Setup Time     Final   Value: 05/22/2013 02:02     Performed at [facility] Count     Final   Value: NO GROWTH     Performed at [facility]   Culture     Final   Value: NO GROWTH     Performed at [facility]   Report Status 05/22/2013 FINAL   Final     Studies: No results found.  Scheduled Meds: . amitriptyline  50 mg Oral QHS  . enoxaparin (LOVENOX) injection  30 mg Subcutaneous Q24H  . feeding supplement (ENSURE COMPLETE)  237 mL Oral q morning - 10a  . feeding supplement (RESOURCE BREEZE)  1 Container Oral BID BM  . furosemide  20 mg Intravenous Q12H  . leflunomide  20 mg Oral Daily  . metoprolol succinate  100 mg Oral QPM  . predniSONE  5 mg Oral Daily  . saccharomyces boulardii  250 mg Oral BID   Continuous Infusions:     Darcus Edds  [facility] Pager [phone]. If 7PM-7AM, please contact night-coverage at [email or web address], password [facility] 05/26/2013, 1:22 PM  LOS: 10 days

## 2013-05-27 LAB — CBC
HEMATOCRIT: 36.4 % (ref 36.0–46.0)
Hemoglobin: 11.6 g/dL — ABNORMAL LOW (ref 12.0–15.0)
MCH: 31.5 pg (ref 26.0–34.0)
MCHC: 31.9 g/dL (ref 30.0–36.0)
MCV: 98.9 fL (ref 78.0–100.0)
Platelets: 278 10*3/uL (ref 150–400)
RBC: 3.68 MIL/uL — AB (ref 3.87–5.11)
RDW: 16.7 % — AB (ref 11.5–15.5)
WBC: 8.2 10*3/uL (ref 4.0–10.5)

## 2013-05-27 LAB — COMPREHENSIVE METABOLIC PANEL
ALT: 6 U/L (ref 0–35)
AST: 21 U/L (ref 0–37)
Albumin: 3 g/dL — ABNORMAL LOW (ref 3.5–5.2)
Alkaline Phosphatase: 98 U/L (ref 39–117)
BILIRUBIN TOTAL: 0.3 mg/dL (ref 0.3–1.2)
BUN: 12 mg/dL (ref 6–23)
CALCIUM: 9.4 mg/dL (ref 8.4–10.5)
CHLORIDE: 99 meq/L (ref 96–112)
CO2: 25 meq/L (ref 19–32)
CREATININE: 0.46 mg/dL — AB (ref 0.50–1.10)
GFR calc Af Amer: >90 mL/min (ref >90)
GFR, EST NON AFRICAN AMERICAN: 88 mL/min — AB (ref >90)
Glucose, Bld: 92 mg/dL (ref 70–99)
Potassium: 4.2 mEq/L (ref 3.7–5.3)
Sodium: 136 mEq/L — ABNORMAL LOW (ref 137–147)
Total Protein: 6.6 g/dL (ref 6.0–8.3)

## 2013-05-27 LAB — TSH: TSH: 2.02 u[IU]/mL (ref 0.350–4.500)

## 2013-05-27 LAB — MAGNESIUM: MAGNESIUM: 1.8 mg/dL (ref 1.5–2.5)

## 2013-05-27 MED ORDER — KCL IN DEXTROSE-NACL 20-5-0.9 MEQ/L-%-% IV SOLN
INTRAVENOUS | Status: AC
  Administered 2013-05-27: 18:00:00 via INTRAVENOUS
  Filled 2013-05-27: qty 1000

## 2013-05-27 NOTE — Progress Notes (Signed)
[facility] PROGRESS NOTE  [name] GUY:[id number] DOB: Aug 23, 1925 DOA: 05/16/2013 PCP: Tula Nakayama, MD  Interim summary:  [name] is a pleasant 87/F with h/o CAD, hypertension, Anal Ca s/p XRT in 11/14 and CHF, who was recently discharged on 05/03/2013, after treatment for pancolitis with Cipro/Flagyl course which she completed, She was admitted with worsening of her chronic diarrhea, over 6-7 weeks, fever and then developed metabolic encephalopathy apparently from this. Repeat CT showed persistent pan colitis. This  improved on Zosyn, Still with severe perisistent Diarrhea, GI signed off on 05/25/13, Cdiff and GI pathogen panel negative. Patient reportedly refused Flex-sig. She was started on Imodium 4/14. Hypokalemia has resolved. 2-D echocardiogram showed normal ejection fraction with mild to moderate aortic stenosis. She was on the dry side and improved with gentle rehydration and potassium replacement. Remains slightly tachycardic. Assessment/Plan:  Principal Problem:  Diarrhea: Chronic issue, ongoing for several months but acutely worse in the setting of colitis. CT scan done on admission note unchanged pancolitis. 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 GI pathogen panel negative so have started Imodium. Continue florastor per GI recommendations. She seems to be showing some improvement. Tolerating solids.  Active Problems:  Hiatal hernia  Dehydration/hypokalemia: Continue gentle fluids.  Protein-calorie malnutrition, severe:Pt meets criteria for severe MALNUTRITION in the context of chronic illness as evidenced by 10% weight loss in 3 month period, PO intake <75% for > one month, severe muscle wasting and subcutaneous fat loss. Have started resource breeze twice a day  Pancolitis: See above  Nausea & vomiting: Secondary pancolitis. Better.   Sepsis: Resolved. Present on admission, due to recurrent colitis. Antibiotics Discontinued today. Blood cultures negative. C. difficile negative.  Code Status: DO NOT RESUSCITATE  Family Communication: Spoke with son at bedside.  Disposition Plan: Likely home once patient feels stronger, hopefully after more gentle rehydration tomorrow. Consultants:  Gastroenterology Procedures:  None Antibiotics:  IV Zosyn 4/9-4/15  Cipro and Flagyl by mouth 4/15-4/18.  HPI/Subjective: Feels stronger than yesterday but is still somewhat lethargic  Objective: Filed Vitals:   05/27/13 1400  BP: 122/68  Pulse: 108  Temp: 98.1 F (36.7 C)  Resp: 18    Intake/Output Summary (Last 24 hours) at 05/27/13 1716 Last data filed at 05/27/13 5638  Gross per 24 hour  Intake    240 ml  Output      0 ml  Net    240 ml   Filed Weights   05/25/13 0608 05/26/13 0533 05/27/13 0441  Weight: 44.2 kg (97 lb 7.1 oz) 42.6 kg (93 lb 14.7 oz) 41.6 kg (91 lb 11.4 oz)    Exam:   General: Comfortably to the   Cardiovascular: Ejection systolic murmur throughout the precordium.S1S2 Normal. RRR.   Respiratory: Lungs clear   Abdomen: Soft and nontender.   Musculoskeletal: Normal.    Data Reviewed: Basic Metabolic Panel:  Recent Labs Lab 05/21/13 0337 05/23/13 0401 05/24/13 0354 05/26/13 0333 05/27/13 0531  NA 140 142 142 139 136*  K 3.4* 4.5 5.1 3.1* 4.2  CL 110 109 108 96 99  CO2 20 23 25 27 25   GLUCOSE 78 78 79 102* 92  BUN <3* <3* 4* 11 12  CREATININE 0.44* 0.45* 0.53 0.40* 0.46*  CALCIUM 7.6* 9.2 8.8 9.1 9.4  MG  --   --   --  1.8 1.8   Liver Function Tests:  Recent Labs Lab 05/27/13 0531  AST  21  ALT 6  ALKPHOS 98  BILITOT 0.3  PROT 6.6  ALBUMIN 3.0*   No results found for this basename: LIPASE, AMYLASE,  in the last 168 hours No results found for this basename: AMMONIA,  in the last 168 hours CBC:  Recent Labs Lab 05/24/13 0354 05/27/13 0531  WBC 4.9 8.2  HGB 9.6*  11.6*  HCT 30.6* 36.4  MCV 100.3* 98.9  PLT 220 278   Cardiac Enzymes: No results found for this basename: CKTOTAL, CKMB, CKMBINDEX, TROPONINI,  in the last 168 hours BNP (last 3 results)  Recent Labs  05/24/13 1500  PROBNP 2482.0*   CBG: No results found for this basename: GLUCAP,  in the last 168 hours  Recent Results (from the past 240 hour(s))  STOOL CULTURE     Status: None   Collection Time    05/18/13  8:38 PM      Result Value Ref Range Status   Specimen Description STOOL   Final   Special Requests NONE   Final   Culture     Final   Value: NO SALMONELLA, SHIGELLA, CAMPYLOBACTER, YERSINIA, OR E.COLI 0157:H7 ISOLATED     Note: REDUCED NORMAL FLORA PRESENT     Performed at [facility]   Report Status 05/22/2013 FINAL   Final  CLOSTRIDIUM DIFFICILE BY PCR     Status: None   Collection Time    05/18/13  8:38 PM      Result Value Ref Range Status   C difficile by pcr NEGATIVE  NEGATIVE Final   Comment: Performed at [facility]  URINE CULTURE     Status: None   Collection Time    05/21/13  7:26 PM      Result Value Ref Range Status   Specimen Description URINE, CLEAN CATCH   Final   Special Requests Zosyn, Arava Normal   Final   Culture  Setup Time     Final   Value: 05/22/2013 02:02     Performed at [facility] Count     Final   Value: NO GROWTH     Performed at [facility]   Culture     Final   Value: NO GROWTH     Performed at [facility]   Report Status 05/22/2013 FINAL   Final     Studies: No results found.  Scheduled Meds: . amitriptyline  50 mg Oral QHS  . enoxaparin (LOVENOX) injection  30 mg Subcutaneous Q24H  . feeding supplement (ENSURE COMPLETE)  237 mL Oral q morning - 10a  . feeding supplement (RESOURCE BREEZE)  1 Container Oral BID BM  . furosemide  20 mg Oral Daily  . leflunomide  20 mg Oral Daily  . metoprolol succinate  100 mg Oral QPM  . predniSONE  5 mg Oral Daily  .  saccharomyces boulardii  250 mg Oral BID   Continuous Infusions: . dextrose 5 % and 0.9 % NaCl with KCl 20 mEq/L       [name]  [facility] Pager [phone]. If 7PM-7AM, please contact night-coverage at [email or web address], password [facility] 05/27/2013, 5:16 PM  LOS: 11 days

## 2013-05-28 DIAGNOSIS — E860 Dehydration: Secondary | ICD-10-CM

## 2013-05-28 LAB — BASIC METABOLIC PANEL
BUN: 18 mg/dL (ref 6–23)
CALCIUM: 8.9 mg/dL (ref 8.4–10.5)
CO2: 24 meq/L (ref 19–32)
CREATININE: 0.54 mg/dL (ref 0.50–1.10)
Chloride: 99 mEq/L (ref 96–112)
GFR, EST NON AFRICAN AMERICAN: 84 mL/min — AB (ref >90)
Glucose, Bld: 97 mg/dL (ref 70–99)
Potassium: 3.7 mEq/L (ref 3.7–5.3)
Sodium: 136 mEq/L — ABNORMAL LOW (ref 137–147)

## 2013-05-28 LAB — PRO B NATRIURETIC PEPTIDE: Pro B Natriuretic peptide (BNP): 810.6 pg/mL — ABNORMAL HIGH (ref 0–450)

## 2013-05-28 LAB — MAGNESIUM: Magnesium: 1.8 mg/dL (ref 1.5–2.5)

## 2013-05-28 MED ORDER — LOPERAMIDE HCL 2 MG PO CAPS
2.0000 mg | ORAL_CAPSULE | Freq: Two times a day (BID) | ORAL | Status: DC
  Administered 2013-05-29: 2 mg via ORAL
  Filled 2013-05-28 (×4): qty 1

## 2013-05-28 NOTE — Progress Notes (Signed)
[facility] PROGRESS NOTE  [name] GMW:[id number] DOB: 1925/06/18 DOA: 05/16/2013 PCP: Andreas Blower, MD  Interim summary:  [name] is a pleasant 87/F with h/o CAD, hypertension, Anal Ca s/p XRT in 11/14 and CHF, who was recently discharged on 05/03/2013, after treatment for pancolitis with Cipro/Flagyl course which she completed, She was admitted with worsening of her chronic diarrhea, over 6-7 weeks, fever and then developed metabolic encephalopathy apparently from this. Repeat CT showed persistent pan colitis. This  improved on Zosyn, Still with severe perisistent Diarrhea, GI signed off on 05/25/13, Cdiff and GI pathogen panel negative. Patient reportedly refused Flex-sig. She was started on Imodium 4/14. Hypokalemia has resolved. 2-D echocardiogram showed normal ejection fraction with mild to moderate aortic stenosis. She was on the dry side and improved with gentle rehydration and potassium replacement. Remains slightly tachycardic. Assessment/Plan:  Principal Problem:  Diarrhea: Chronic issue, ongoing for several months but acutely worse in the setting of colitis. CT scan done on admission note unchanged pancolitis. 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 GI pathogen panel negative so have started Imodium. Continue florastor per GI recommendations. She seems to be showing some improvement. Tolerating solids. Reason again so scheduled Imodium Active Problems:  Hiatal hernia  Dehydration/hypokalemia: Continue gentle fluids.  Protein-calorie malnutrition, severe:Pt meets criteria for severe MALNUTRITION in the context of chronic illness as evidenced by 10% weight loss in 3 month period, PO intake <75% for > one month, severe muscle wasting and subcutaneous fat loss. Have started resource breeze twice a day  Pancolitis: See above  Nausea & vomiting:  Secondary pancolitis. Better.  Sepsis: Resolved. Present on admission, due to recurrent colitis. Antibiotics Discontinued today. Blood cultures negative. C. difficile negative.  Code Status: DO NOT RESUSCITATE  Family Communication: Left message with son Disposition Plan: Home in the morning Consultants:  Gastroenterology Procedures:  None Antibiotics:  IV Zosyn 4/9-4/15  Cipro and Flagyl by mouth 4/15-4/18.  HPI/Subjective: Felt better yesterday.  Weaker, after having a number of loose bowel movements this morning  Objective: Filed Vitals:   05/28/13 1332  BP: 112/75  Pulse: 89  Temp: 98.2 F (36.8 C)  Resp: 18    Intake/Output Summary (Last 24 hours) at 05/28/13 1604 Last data filed at 05/28/13 1351  Gross per 24 hour  Intake    120 ml  Output      0 ml  Net    120 ml   Filed Weights   05/26/13 0533 05/27/13 0441 05/28/13 0418  Weight: 42.6 kg (93 lb 14.7 oz) 41.6 kg (91 lb 11.4 oz) 41.1 kg (90 lb 9.7 oz)    Exam:   General: Feels weak  Cardiovascular: Ejection systolic murmur throughout the precordium.S1S2 Normal. RRR.   Respiratory: Lungs clear   Abdomen: Soft and nontender.   Musculoskeletal: Normal.    Data Reviewed: Basic Metabolic Panel:  Recent Labs Lab 05/23/13 0401 05/24/13 0354 05/26/13 0333 05/27/13 0531 05/28/13 0358  NA 142 142 139 136* 136*  K 4.5 5.1 3.1* 4.2 3.7  CL 109 108 96 99 99  CO2 23 25 27 25 24   GLUCOSE 78 79 102* 92 97  BUN <3* 4* 11 12 18   CREATININE 0.45* 0.53 0.40* 0.46* 0.54  CALCIUM 9.2 8.8 9.1 9.4 8.9  MG  --   --  1.8 1.8 1.8   Liver Function Tests:  Recent Labs Lab 05/27/13 0531  AST 21  ALT 6  ALKPHOS 98  BILITOT 0.3  PROT 6.6  ALBUMIN 3.0*   No results found for this basename: LIPASE, AMYLASE,  in the last 168 hours No results found for this basename: AMMONIA,  in the last 168 hours CBC:  Recent Labs Lab 05/24/13 0354 05/27/13 0531  WBC 4.9 8.2  HGB 9.6* 11.6*  HCT 30.6* 36.4  MCV  100.3* 98.9  PLT 220 278   Cardiac Enzymes: No results found for this basename: CKTOTAL, CKMB, CKMBINDEX, TROPONINI,  in the last 168 hours BNP (last 3 results)  Recent Labs  05/24/13 1500 05/28/13 0358  PROBNP 2482.0* 810.6*   CBG: No results found for this basename: GLUCAP,  in the last 168 hours  Recent Results (from the past 240 hour(s))  STOOL CULTURE     Status: None   Collection Time    05/18/13  8:38 PM      Result Value Ref Range Status   Specimen Description STOOL   Final   Special Requests NONE   Final   Culture     Final   Value: NO SALMONELLA, SHIGELLA, CAMPYLOBACTER, YERSINIA, OR E.COLI 0157:H7 ISOLATED     Note: REDUCED NORMAL FLORA PRESENT     Performed at [facility]   Report Status 05/22/2013 FINAL   Final  CLOSTRIDIUM DIFFICILE BY PCR     Status: None   Collection Time    05/18/13  8:38 PM      Result Value Ref Range Status   C difficile by pcr NEGATIVE  NEGATIVE Final   Comment: Performed at [place]     Status: None   Collection Time    05/21/13  7:26 PM      Result Value Ref Range Status   Specimen Description URINE, CLEAN CATCH   Final   Special Requests Zosyn, Arava Normal   Final   Culture  Setup Time     Final   Value: 05/22/2013 02:02     Performed at [place]     Final   Value: NO GROWTH     Performed at [facility]   Culture     Final   Value: NO GROWTH     Performed at [facility]   Report Status 05/22/2013 FINAL   Final     Studies: No results found.  Scheduled Meds: . amitriptyline  50 mg Oral QHS  . enoxaparin (LOVENOX) injection  30 mg Subcutaneous Q24H  . feeding supplement (ENSURE COMPLETE)  237 mL Oral q morning - 10a  . feeding supplement (RESOURCE BREEZE)  1 Container Oral BID BM  . furosemide  20 mg Oral Daily  . leflunomide  20 mg Oral Daily  . loperamide  2 mg Oral BID  . metoprolol succinate  100 mg Oral QPM  . predniSONE  5 mg Oral  Daily  . saccharomyces boulardii  250 mg Oral BID   Continuous Infusions:     Annita Brod  [facility] Pager [phone]. If 7PM-7AM, please contact night-coverage at [email or web address], password [facility] 05/28/2013, 4:04 PM  LOS: 12 days

## 2013-05-28 NOTE — Progress Notes (Signed)
Occupational Therapy Treatment Patient Details Name: [name] MRN: [id number] DOB: August 24, 1925 Today's Date: 05/28/2013    History of present illness Pt is an 80 year old female recently discharged on 05/03/2013 with pancolitis, re-admitted 4/08 for intractable diarrhea; PMH of arthritis noted at hands and feet, CAD, CHF, HTN, and rectal CA   OT comments  encouraged pt to agree to [facility] to ensure she is getting stronger and for safety/fall prevention.  Follow Up Recommendations  Home health OT          Precautions / Restrictions Precautions Precautions: Fall       Mobility Bed Mobility Overal bed mobility: Needs Assistance Bed Mobility: Supine to Sit;Sit to Supine     Supine to sit: Supervision Sit to supine: Supervision      Transfers Overall transfer level: Needs assistance Equipment used: 4-wheeled walker Transfers: Sit to/from Stand Sit to Stand: Supervision              Balance                                   ADL   Eating/Feeding: Set up   Grooming: Sitting;Set up                   Toilet Transfer: Minimal assistance;BSC;Stand-pivot   Toileting- Clothing Manipulation and Hygiene: Minimal assistance;Sit to/from stand       Functional mobility during ADLs: Minimal assistance General ADL Comments: Pt overall min A with ADL activity.       Vision                     Perception     Praxis      Cognition   Behavior During Therapy: WFL for tasks assessed/performed Overall Cognitive Status: Within Functional Limits for tasks assessed                       Extremity/Trunk Assessment               Exercises     Shoulder Instructions       General Comments        Home Living                                          Prior Functioning/Environment              Frequency       Progress Toward Goals  OT Goals(current goals can now be found in the care plan  section)  Progress towards OT goals: Progressing toward goals  Acute Rehab OT Goals Patient Stated Goal: get stronger  Plan Discharge plan remains appropriate    Co-evaluation                 End of Session     Activity Tolerance     Patient Left in chair;with call bell/phone within reach;with nursing/sitter in room   Nurse Communication          Time: 2202-5427 OT Time Calculation (min): 34 min  Charges: OT General Charges $OT Visit: 1 Procedure OT Evaluation $Initial OT Evaluation Tier I: 1 Procedure OT Treatments $Self Care/Home Management : 8-22 mins  [name] 05/28/2013, 11:47 AM

## 2013-05-28 NOTE — Progress Notes (Signed)
Pt with small dark colored stool.  Liquid in form. No order

## 2013-05-28 NOTE — Progress Notes (Signed)
PT Cancellation Note  ___Treatment cancelled today due to medical issues with patient which prohibited therapy  ___ Treatment cancelled today due to patient receiving procedure or test   ___ Treatment cancelled today due to patient's refusal to participate   _X_ Treatment cancelled today due to pt's request as she c/o freq trips on/off BSC due to diarrhea.   Rica Koyanagi  PTA [facility]  Acute  Rehab Pager      [phone]

## 2013-05-29 MED ORDER — POTASSIUM CHLORIDE CRYS ER 20 MEQ PO TBCR: 20.0000 meq | EXTENDED_RELEASE_TABLET | Freq: Every day | ORAL | Status: DC

## 2013-05-29 MED ORDER — BOOST / RESOURCE BREEZE PO LIQD: 1.0000 | Freq: Two times a day (BID) | ORAL | Status: AC

## 2013-05-29 MED ORDER — LOPERAMIDE HCL 2 MG PO CAPS: 4.0000 mg | ORAL_CAPSULE | ORAL | Status: DC | PRN

## 2013-05-29 MED ORDER — LOPERAMIDE HCL 2 MG PO CAPS: 2.0000 mg | ORAL_CAPSULE | Freq: Two times a day (BID) | ORAL | Status: DC

## 2013-05-29 MED ORDER — FUROSEMIDE 20 MG PO TABS: 20.0000 mg | ORAL_TABLET | Freq: Every day | ORAL | Status: DC

## 2013-05-29 MED ORDER — SACCHAROMYCES BOULARDII 250 MG PO CAPS: 250.0000 mg | ORAL_CAPSULE | Freq: Two times a day (BID) | ORAL | Status: DC

## 2013-05-29 NOTE — Discharge Instructions (Signed)
Proctitis Proctitis is the swelling and soreness (inflammation) of the lining of the rectum. The rectum is at the end of the large intestine and is attached to the anus. The inflammation causes pain and discomfort. It may be short-term (acute) or long-lasting (chronic). CAUSES Inflammation in the rectum can be caused by many things, such as:  Sexually transmitted diseases (STDs).  Infection.  Anal-rectal trauma or injury.  Ulcerative colitis or Crohn's disease.  Radiation therapy directed near the rectum.  Antibiotic therapy. SYMPTOMS  Sudden, uncomfortable, and frequent urge to have a bowel movement.  Anal or rectal pain.  Abdominal cramping or pain.  Sensation that the rectum is full.  Rectal bleeding.  Pus or mucus discharge from anus.  Diarrhea or frequent soft, loose stools. DIAGNOSIS Diagnosis may include the following:  A history and physical exam.  An STD test.  Blood tests.  Stool tests.  Rectal culture.  A procedure to evaluate the anal canal (anoscopy).  Procedures to look at part, or the entire large bowel (sigmoidoscopy, colonoscopy). TREATMENT Treatment of proctitis depends on the cause. Reducing the symptoms of inflammation and eliminating infection are the main goals of treatment. Treatment may include:  Home remedies and lifestyle, such as sitz baths and avoiding food right before bedtime.  Topical ointments, foams, suppositories, or enemas, such as corticosteroids or anti-inflammatories.  Antibiotic or antiviral medicines to treat infection or to control harmful bacteria.  Medicines to control diarrhea, soften stools, and reduce pain.  Medicines to suppress the immune system.  Avoiding the activity that caused rectal trauma.  Nutritional, dietary, or herbal supplements.  Heat or laser therapy for persistent bleeding.  A dilation procedure to enlarge a narrowed rectum.  Surgery, though rare, may be necessary to repair damaged rectal  lining. HOME CARE INSTRUCTIONS Only take medicines that are recommended or approved by your caregiver.Do not take anti-diarrhea medicine without your caregiver's approval. SEEK MEDICAL CARE IF:  You often experience one or more of the symptoms noted above.  You keep experiencing symptoms after treatment.  You have questions or concerns about your symptoms or treatment plan. MAKE SURE YOU:  Understand these instructions.  Will watch your condition.  Will get help right away if you are not doing well or get worse. [place] of Diabetes and Digestive and Kidney Disease (NIDDK): [email or web address]-[email or web address] Document Released: 01/14/2011 Document Revised: 05/22/2012 Document Reviewed: 01/14/2011 [facility] Patient Information 2014 [place], [place].

## 2013-05-29 NOTE — Discharge Summary (Signed)
Physician Discharge Summary  [name] [id number] DOB: [birth year] DOA: 05/16/2013  PCP: Andreas Blower, MD  Admit date: 05/16/2013 Discharge date: 05/29/2013  Time spent: 25 minutes  Recommendations for Outpatient Follow-up:  1. New medications: Florastor probiotic 250 mg twice a day 2. Medication adjustment: Patient advised to be on scheduled Imodium 2 mg twice a day in addition to her when necessary dosing 3. It was recommended patient go home with home physical therapy which she declined 4. Patient's supplemental potassium has been decreased to 20 mEq daily 5. New medications: Lasix 20 mg by mouth daily 6. New supplementation: Ensure plus twice a day  Discharge Diagnoses:  Principal Problem:   Acute diastolic heart failure Active Problems:   Rheumatoid arthritis on chronic steroids   Hiatal hernia   Diarrhea   Dehydration   Protein-calorie malnutrition, severe   Pancolitis   Colitis   Nausea & vomiting   Sepsis   Discharge Condition: Improved, being discharged home  Diet recommendation: Low sodium heart healthy with Ensure +3 times a day  Filed Weights   05/27/13 0441 05/28/13 0418 05/29/13 0558  Weight: 41.6 kg (91 lb 11.4 oz) 41.1 kg (90 lb 9.7 oz) 39.6 kg (87 lb 4.8 oz)    History of present illness:  Patient is 87/F with h/o CAD, hypertension, Anal Ca s/p XRT in 11/14 and CHF, who was recently discharged on 05/03/2013, after treatment for pancolitis with Cipro/Flagyl course which she completed, She was admitted on 4/8 with worsening of her chronic diarrhea, over 6-7 weeks, fever and then developed metabolic encephalopathy from this, repeat CT showed persistent pancolitis. Patient admitted to the hospitalist service  Hospital Course:  Patient was started on broad-spectrum Zosyn. GI was consulted. C. difficile and GI pathogen panel negative. Patient refused flexible sigmoidoscopy. 2 start on Imodium 4/14. Was likely felt that she had pancolitis more  susceptible do to chronic steroid use. 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 Recommendation is after completion of antibiotics, some scheduled Imodium plus probiotics  Acute on chronic diastolic heart failure: Patient was noted to be mildly tachycardic after rehydration. BNP checked and found to be mildly elevated at 2400. Patient started on IV Lasix and tolerated well. By 4/20, BNP down to 810. She'll be discharged on low dose Lasix 20 mg daily  Active Problems:   Rheumatoid arthritis on chronic steroids: Patient initially started on stress dose steroids and then taper down to home dose prednisone.    Hiatal hernia: Stable.    Diarrhea: Initially from colitis and patient likely has chronic diarrhea brought on by radiation or tightness. Treated with continued when necessary Imodium but have added scheduled Imodium twice a day which seems to improved her symptoms.    Dehydration: Secondary to recurrent diarrhea. Rehydrated.   Protein-calorie malnutrition, severe:Pt meets criteria for severe MALNUTRITION in the context of chronic illness as evidenced by 10% weight loss in 3 month period, PO intake <75% for > one month, severe muscle wasting and subcutaneous fat loss. Have started resource breeze twice a day     Pancolitis: See above    Nausea & vomiting: Secondary to pancolitis. Improved.    Sepsis: Resolved. Present on admission due to recurrent colitis. Completed course of antibiotics. Blood cultures and C. difficile culture negative  Procedures:  None  Consultations:  Gastroenterology  Discharge Exam: Filed Vitals:   05/29/13 0820  BP: 106/72  Pulse: 107  Temp: 97.7 F (36.5 C)  Resp: 24  General: Alert and oriented x3, fatigued Cardiovascular: Regular rate and rhythm, Z3-G6, 2/6 systolic ejection murmur Respiratory: Clear to auscultation bilaterally  Discharge Instructions You were cared for by a hospitalist  during your hospital stay. If you have any questions about your discharge medications or the care you received while you were in the hospital after you are discharged, you can call the unit and asked to speak with the hospitalist on call if the hospitalist that took care of you is not available. Once you are discharged, your primary care physician will handle any further medical issues. Please note that NO REFILLS for any discharge medications will be authorized once you are discharged, as it is imperative that you return to your primary care physician (or establish a relationship with a primary care physician if you do not have one) for your aftercare needs so that they can reassess your need for medications and monitor your lab values.  Discharge Orders   Future Appointments Provider Department Dept Phone   05/31/2013 9:15 AM [place] [place] [facility]   06/01/2013 2:00 PM [facility] [phone]   06/01/2013 2:30 PM [facility] [phone]   08/02/2013 2:00 PM [facility], [facility] [phone]   Future Orders Complete By Expires   Diet - low sodium heart healthy  As directed    Increase activity slowly  As directed        Medication List         acetaminophen 500 MG tablet  Commonly known as:  TYLENOL  Take 1,000 mg by mouth every 6 (six) hours as needed for pain.     amitriptyline 50 MG tablet  Commonly known as:  ELAVIL  Take 50 mg by mouth at bedtime.     feeding supplement (RESOURCE BREEZE) Liqd  Take 1 Container by mouth 2 (two) times daily between meals.     ENSURE PLUS Liqd  Take 237 mLs by mouth 2 (two) times daily as needed (for suboptimal po intake.).     esomeprazole 40 MG capsule  Commonly known as:  NEXIUM  Take 1 capsule (40 mg total) by mouth daily before breakfast.     EXCEDRIN  EXTRA STRENGTH 250-250-65 MG per tablet  Generic drug:  aspirin-acetaminophen-caffeine  Take 2 tablets by mouth every 6 (six) hours as needed for pain.     furosemide 20 MG tablet  Commonly known as:  LASIX  Take 1 tablet (20 mg total) by mouth daily.     hydroxypropyl methylcellulose 2.5 % ophthalmic solution  Commonly known as:  ISOPTO TEARS  Place 1 drop into both eyes 3 (three) times daily as needed for dry eyes.     leflunomide 20 MG tablet  Commonly known as:  ARAVA  Take 20 mg by mouth daily.     loperamide 2 MG capsule  Commonly known as:  IMODIUM  Take 2 capsules (4 mg total) by mouth as needed for diarrhea or loose stools (after each loose BM, up to 4 times a day).     loperamide 2 MG capsule  Commonly known as:  IMODIUM  Take 1 capsule (2 mg total) by mouth 2 (two) times daily.     metoprolol succinate 100 MG 24 hr tablet  Commonly known as:  TOPROL-XL  Take 1 tablet (100 mg total) by mouth every evening. Take with or immediately  following a meal.     potassium chloride SA 20 MEQ tablet  Commonly known as:  K-DUR,KLOR-CON  Take 1 tablet (20 mEq total) by mouth daily.     predniSONE 5 MG tablet  Commonly known as:  DELTASONE  Take 5 mg by mouth daily.     PRESERVISION AREDS PO  Take 1 capsule by mouth daily.     saccharomyces boulardii 250 MG capsule  Commonly known as:  FLORASTOR  Take 1 capsule (250 mg total) by mouth 2 (two) times daily.        Allergies  Allergen Reactions  . Ativan [Lorazepam]     Confused and out of it       Follow-up Information   Follow up with Andreas Blower, MD In 2 weeks.   Specialty:  Family Medicine   Contact information:   [place] [address] [phone]        The results of significant diagnostics from this hospitalization (including imaging, microbiology, ancillary and laboratory) are listed below for reference.    Significant Diagnostic Studies: Ct Abdomen Pelvis W Contrast  05/17/2013      IMPRESSION: 1. Diffuse colonic wall thickening, unchanged and indicative of pan colitis. Associated presacral edema has increased slightly. 2. Tiny bilateral effusions with compressive atelectasis in both lower lobes. 3. Periportal edema and biliary ductal dilatation are unchanged. 4. Heterogeneous and partially calcified nodal masses in the right external iliac chain and right inguinal region, stable. 5. Large hiatal hernia. 6. Hepatomegaly. 7. Sludge ball or non calcified gallstone.  Electronically Signed: By: Lorin Picket M.D. On: 05/17/2013 13:31   Ct Abdomen Pelvis W Contrast  04/29/2013    IMPRESSION: Pancolonic wall thickening compatible with colitis - likely infectious/pseudomembranous. No evidence of bowel obstruction, pneumoperitoneum or abscess.  Intra and extrahepatic biliary dilatation with CBD measuring up to 12 mm. No obstructing cause identified and consider further evaluation with ERCP/ MRCP.  1.8 cm gallstone.  Very large hiatal hernia with intrathoracic stomach -unchanged.  Decreased right inguinal and pelvic sidewall adenopathy from 2013.   Electronically Signed   By: Hassan Rowan M.D.   On: 04/29/2013 18:22    Microbiology: Recent Results (from the past 240 hour(s))  URINE CULTURE     Status: None   Collection Time    05/21/13  7:26 PM      Result Value Ref Range Status   Specimen Description URINE, CLEAN CATCH   Final   Special Requests Zosyn, Arava Normal   Final   Culture  Setup Time     Final   Value: 05/22/2013 02:02     Performed at [place] [facility]     Final   Value: NO GROWTH     Performed at [facility]   Culture     Final   Value: NO GROWTH     Performed at [facility]   Report Status 05/22/2013 FINAL   Final     Labs: Basic Metabolic Panel:  Recent Labs Lab 05/23/13 0401 05/24/13 0354 05/26/13 0333 05/27/13 0531 05/28/13 0358  NA 142 142 139 136* 136*  K 4.5 5.1 3.1* 4.2 3.7  CL 109 108 96 99 99  CO2 23 25  27 25 24   GLUCOSE 78 79 102* 92 97  BUN <3* 4* 11 12 18   CREATININE 0.45* 0.53 0.40* 0.46* 0.54  CALCIUM 9.2 8.8 9.1 9.4 8.9  MG  --   --  1.8 1.8  1.8   Liver Function Tests:  Recent Labs Lab 05/27/13 0531  AST 21  ALT 6  ALKPHOS 98  BILITOT 0.3  PROT 6.6  ALBUMIN 3.0*   No results found for this basename: LIPASE, AMYLASE,  in the last 168 hours No results found for this basename: AMMONIA,  in the last 168 hours CBC:  Recent Labs Lab 05/24/13 0354 05/27/13 0531  WBC 4.9 8.2  HGB 9.6* 11.6*  HCT 30.6* 36.4  MCV 100.3* 98.9  PLT 220 278   Cardiac Enzymes: No results found for this basename: CKTOTAL, CKMB, CKMBINDEX, TROPONINI,  in the last 168 hours BNP: BNP (last 3 results)  Recent Labs  05/24/13 1500 05/28/13 0358  PROBNP 2482.0* 810.6*   CBG: No results found for this basename: GLUCAP,  in the last 168 hours     Signed:  Annita Brod  [facility] 05/29/2013, 10:53 AM

## 2013-05-30 ENCOUNTER — Other Ambulatory Visit: Payer: Self-pay

## 2013-05-30 ENCOUNTER — Telehealth: Payer: Self-pay | Admitting: Internal Medicine

## 2013-05-30 ENCOUNTER — Inpatient Hospital Stay (HOSPITAL_COMMUNITY)
Admission: EM | Admit: 2013-05-30 | Discharge: 2013-05-31 | Disposition: A | Discharge status: Home Health | Payer: Medicare Other | Source: Home / Self Care | Attending: Internal Medicine | Admitting: Internal Medicine

## 2013-05-30 ENCOUNTER — Inpatient Hospital Stay (HOSPITAL_COMMUNITY): Payer: Medicare Other

## 2013-05-30 ENCOUNTER — Encounter (HOSPITAL_COMMUNITY): Payer: Self-pay | Admitting: Emergency Medicine

## 2013-05-30 DIAGNOSIS — I5030 Unspecified diastolic (congestive) heart failure: Secondary | ICD-10-CM

## 2013-05-30 DIAGNOSIS — J8410 Pulmonary fibrosis, unspecified: Secondary | ICD-10-CM | POA: Diagnosis present

## 2013-05-30 DIAGNOSIS — B9620 Unspecified Escherichia coli [E. coli] as the cause of diseases classified elsewhere: Secondary | ICD-10-CM

## 2013-05-30 DIAGNOSIS — I5189 Other ill-defined heart diseases: Secondary | ICD-10-CM

## 2013-05-30 DIAGNOSIS — Z9861 Coronary angioplasty status: Secondary | ICD-10-CM

## 2013-05-30 DIAGNOSIS — Z923 Personal history of irradiation: Secondary | ICD-10-CM

## 2013-05-30 DIAGNOSIS — R609 Edema, unspecified: Secondary | ICD-10-CM

## 2013-05-30 DIAGNOSIS — IMO0002 Reserved for concepts with insufficient information to code with codable children: Secondary | ICD-10-CM

## 2013-05-30 DIAGNOSIS — F329 Major depressive disorder, single episode, unspecified: Secondary | ICD-10-CM

## 2013-05-30 DIAGNOSIS — I359 Nonrheumatic aortic valve disorder, unspecified: Secondary | ICD-10-CM

## 2013-05-30 DIAGNOSIS — I340 Nonrheumatic mitral (valve) insufficiency: Secondary | ICD-10-CM

## 2013-05-30 DIAGNOSIS — Z681 Body mass index (BMI) 19 or less, adult: Secondary | ICD-10-CM

## 2013-05-30 DIAGNOSIS — I6529 Occlusion and stenosis of unspecified carotid artery: Secondary | ICD-10-CM

## 2013-05-30 DIAGNOSIS — F3289 Other specified depressive episodes: Secondary | ICD-10-CM | POA: Diagnosis present

## 2013-05-30 DIAGNOSIS — I350 Nonrheumatic aortic (valve) stenosis: Secondary | ICD-10-CM

## 2013-05-30 DIAGNOSIS — E43 Unspecified severe protein-calorie malnutrition: Secondary | ICD-10-CM | POA: Diagnosis present

## 2013-05-30 DIAGNOSIS — J849 Interstitial pulmonary disease, unspecified: Secondary | ICD-10-CM

## 2013-05-30 DIAGNOSIS — E785 Hyperlipidemia, unspecified: Secondary | ICD-10-CM

## 2013-05-30 DIAGNOSIS — M069 Rheumatoid arthritis, unspecified: Secondary | ICD-10-CM

## 2013-05-30 DIAGNOSIS — I10 Essential (primary) hypertension: Secondary | ICD-10-CM

## 2013-05-30 DIAGNOSIS — K625 Hemorrhage of anus and rectum: Secondary | ICD-10-CM

## 2013-05-30 DIAGNOSIS — K5100 Ulcerative (chronic) pancolitis without complications: Secondary | ICD-10-CM

## 2013-05-30 DIAGNOSIS — E876 Hypokalemia: Secondary | ICD-10-CM | POA: Diagnosis present

## 2013-05-30 DIAGNOSIS — I059 Rheumatic mitral valve disease, unspecified: Secondary | ICD-10-CM | POA: Diagnosis present

## 2013-05-30 DIAGNOSIS — K219 Gastro-esophageal reflux disease without esophagitis: Secondary | ICD-10-CM | POA: Diagnosis present

## 2013-05-30 DIAGNOSIS — I509 Heart failure, unspecified: Secondary | ICD-10-CM

## 2013-05-30 DIAGNOSIS — R197 Diarrhea, unspecified: Secondary | ICD-10-CM | POA: Diagnosis present

## 2013-05-30 DIAGNOSIS — Z79899 Other long term (current) drug therapy: Secondary | ICD-10-CM

## 2013-05-30 DIAGNOSIS — C210 Malignant neoplasm of anus, unspecified: Secondary | ICD-10-CM

## 2013-05-30 DIAGNOSIS — Z66 Do not resuscitate: Secondary | ICD-10-CM | POA: Diagnosis present

## 2013-05-30 DIAGNOSIS — I2510 Atherosclerotic heart disease of native coronary artery without angina pectoris: Secondary | ICD-10-CM | POA: Diagnosis present

## 2013-05-30 DIAGNOSIS — I951 Orthostatic hypotension: Secondary | ICD-10-CM | POA: Diagnosis present

## 2013-05-30 DIAGNOSIS — A419 Sepsis, unspecified organism: Secondary | ICD-10-CM

## 2013-05-30 DIAGNOSIS — K810 Acute cholecystitis: Secondary | ICD-10-CM

## 2013-05-30 DIAGNOSIS — I779 Disorder of arteries and arterioles, unspecified: Secondary | ICD-10-CM

## 2013-05-30 DIAGNOSIS — D649 Anemia, unspecified: Secondary | ICD-10-CM

## 2013-05-30 DIAGNOSIS — R7881 Bacteremia: Secondary | ICD-10-CM

## 2013-05-30 DIAGNOSIS — C20 Malignant neoplasm of rectum: Secondary | ICD-10-CM

## 2013-05-30 DIAGNOSIS — K8020 Calculus of gallbladder without cholecystitis without obstruction: Secondary | ICD-10-CM

## 2013-05-30 DIAGNOSIS — K449 Diaphragmatic hernia without obstruction or gangrene: Secondary | ICD-10-CM

## 2013-05-30 DIAGNOSIS — R112 Nausea with vomiting, unspecified: Secondary | ICD-10-CM

## 2013-05-30 DIAGNOSIS — I739 Peripheral vascular disease, unspecified: Secondary | ICD-10-CM

## 2013-05-30 DIAGNOSIS — R590 Localized enlarged lymph nodes: Secondary | ICD-10-CM

## 2013-05-30 DIAGNOSIS — E860 Dehydration: Secondary | ICD-10-CM

## 2013-05-30 DIAGNOSIS — K529 Noninfective gastroenteritis and colitis, unspecified: Secondary | ICD-10-CM

## 2013-05-30 DIAGNOSIS — C801 Malignant (primary) neoplasm, unspecified: Secondary | ICD-10-CM

## 2013-05-30 DIAGNOSIS — I658 Occlusion and stenosis of other precerebral arteries: Secondary | ICD-10-CM

## 2013-05-30 DIAGNOSIS — I8290 Acute embolism and thrombosis of unspecified vein: Secondary | ICD-10-CM

## 2013-05-30 DIAGNOSIS — R9430 Abnormal result of cardiovascular function study, unspecified: Secondary | ICD-10-CM

## 2013-05-30 DIAGNOSIS — I5031 Acute diastolic (congestive) heart failure: Secondary | ICD-10-CM

## 2013-05-30 LAB — CBC WITH DIFFERENTIAL/PLATELET
Basophils Absolute: 0 10*3/uL (ref 0.0–0.1)
Basophils Relative: 0 % (ref 0–1)
EOS ABS: 0.1 10*3/uL (ref 0.0–0.7)
Eosinophils Relative: 1 % (ref 0–5)
HCT: 31.9 % — ABNORMAL LOW (ref 36.0–46.0)
HEMOGLOBIN: 10.5 g/dL — AB (ref 12.0–15.0)
LYMPHS ABS: 0.7 10*3/uL (ref 0.7–4.0)
Lymphocytes Relative: 8 % — ABNORMAL LOW (ref 12–46)
MCH: 32.4 pg (ref 26.0–34.0)
MCHC: 32.9 g/dL (ref 30.0–36.0)
MCV: 98.5 fL (ref 78.0–100.0)
MONOS PCT: 11 % (ref 3–12)
Monocytes Absolute: 0.9 10*3/uL (ref 0.1–1.0)
Neutro Abs: 7 10*3/uL (ref 1.7–7.7)
Neutrophils Relative %: 80 % — ABNORMAL HIGH (ref 43–77)
PLATELETS: 202 10*3/uL (ref 150–400)
RBC: 3.24 MIL/uL — ABNORMAL LOW (ref 3.87–5.11)
RDW: 15.8 % — ABNORMAL HIGH (ref 11.5–15.5)
WBC: 8.7 10*3/uL (ref 4.0–10.5)

## 2013-05-30 LAB — COMPREHENSIVE METABOLIC PANEL
ALT: 11 U/L (ref 0–35)
AST: 37 U/L (ref 0–37)
Albumin: 3.1 g/dL — ABNORMAL LOW (ref 3.5–5.2)
Alkaline Phosphatase: 95 U/L (ref 39–117)
BUN: 19 mg/dL (ref 6–23)
CALCIUM: 9.5 mg/dL (ref 8.4–10.5)
CO2: 26 mEq/L (ref 19–32)
Chloride: 95 mEq/L — ABNORMAL LOW (ref 96–112)
Creatinine, Ser: 0.48 mg/dL — ABNORMAL LOW (ref 0.50–1.10)
GFR calc non Af Amer: 87 mL/min — ABNORMAL LOW (ref >90)
GLUCOSE: 124 mg/dL — AB (ref 70–99)
Potassium: 3.3 mEq/L — ABNORMAL LOW (ref 3.7–5.3)
Sodium: 137 mEq/L (ref 137–147)
TOTAL PROTEIN: 6.4 g/dL (ref 6.0–8.3)
Total Bilirubin: 0.2 mg/dL — ABNORMAL LOW (ref 0.3–1.2)

## 2013-05-30 LAB — LACTIC ACID, PLASMA: LACTIC ACID, VENOUS: 0.9 mmol/L (ref 0.5–2.2)

## 2013-05-30 LAB — TROPONIN I: Troponin I: <0.3 ng/mL (ref <0.30)

## 2013-05-30 MED ORDER — ACETAMINOPHEN 325 MG PO TABS
650.0000 mg | ORAL_TABLET | Freq: Four times a day (QID) | ORAL | Status: DC | PRN
  Administered 2013-05-30 – 2013-05-31 (×2): 650 mg via ORAL
  Filled 2013-05-30 (×2): qty 2

## 2013-05-30 MED ORDER — AMITRIPTYLINE HCL 50 MG PO TABS
50.0000 mg | ORAL_TABLET | Freq: Every day | ORAL | Status: DC
  Administered 2013-05-30: 50 mg via ORAL
  Filled 2013-05-30 (×2): qty 1

## 2013-05-30 MED ORDER — PANTOPRAZOLE SODIUM 40 MG PO TBEC
40.0000 mg | DELAYED_RELEASE_TABLET | Freq: Every day | ORAL | Status: DC
  Administered 2013-05-30 – 2013-05-31 (×2): 40 mg via ORAL
  Filled 2013-05-30 (×2): qty 1

## 2013-05-30 MED ORDER — ENSURE PLUS PO LIQD: 237.0000 mL | Freq: Two times a day (BID) | ORAL | Status: DC | PRN

## 2013-05-30 MED ORDER — SODIUM CHLORIDE 0.9 % IJ SOLN
3.0000 mL | Freq: Two times a day (BID) | INTRAMUSCULAR | Status: DC
  Administered 2013-05-31: 3 mL via INTRAVENOUS

## 2013-05-30 MED ORDER — LOPERAMIDE HCL 2 MG PO CAPS: 4.0000 mg | ORAL_CAPSULE | ORAL | Status: DC | PRN

## 2013-05-30 MED ORDER — LOPERAMIDE HCL 2 MG PO CAPS
2.0000 mg | ORAL_CAPSULE | Freq: Two times a day (BID) | ORAL | Status: DC
  Administered 2013-05-30 – 2013-05-31 (×2): 2 mg via ORAL
  Filled 2013-05-30 (×3): qty 1

## 2013-05-30 MED ORDER — POTASSIUM CHLORIDE IN NACL 20-0.9 MEQ/L-% IV SOLN
INTRAVENOUS | Status: DC
  Administered 2013-05-30: 22:00:00 via INTRAVENOUS
  Filled 2013-05-30 (×2): qty 1000

## 2013-05-30 MED ORDER — BOOST / RESOURCE BREEZE PO LIQD
1.0000 | Freq: Two times a day (BID) | ORAL | Status: DC
  Administered 2013-05-31: 1 via ORAL

## 2013-05-30 MED ORDER — HYDROCORTISONE NA SUCCINATE PF 100 MG IJ SOLR
50.0000 mg | Freq: Three times a day (TID) | INTRAMUSCULAR | Status: DC
  Administered 2013-05-30 – 2013-05-31 (×2): 50 mg via INTRAVENOUS
  Filled 2013-05-30 (×6): qty 1

## 2013-05-30 MED ORDER — SACCHAROMYCES BOULARDII 250 MG PO CAPS
250.0000 mg | ORAL_CAPSULE | Freq: Two times a day (BID) | ORAL | Status: DC
  Administered 2013-05-31: 250 mg via ORAL
  Filled 2013-05-30 (×2): qty 1

## 2013-05-30 MED ORDER — METOPROLOL SUCCINATE ER 50 MG PO TB24
75.0000 mg | ORAL_TABLET | Freq: Every evening | ORAL | Status: DC
  Filled 2013-05-30: qty 1

## 2013-05-30 MED ORDER — LEFLUNOMIDE 20 MG PO TABS
20.0000 mg | ORAL_TABLET | Freq: Every day | ORAL | Status: DC
  Administered 2013-05-31: 20 mg via ORAL
  Filled 2013-05-30: qty 1

## 2013-05-30 MED ORDER — HYDROCORTISONE NA SUCCINATE PF 100 MG IJ SOLR: 50.0000 mg | Freq: Once | INTRAMUSCULAR | Status: DC

## 2013-05-30 MED ORDER — ONDANSETRON HCL 4 MG/2ML IJ SOLN
4.0000 mg | Freq: Once | INTRAMUSCULAR | Status: AC
  Administered 2013-05-30: 4 mg via INTRAVENOUS
  Filled 2013-05-30: qty 2

## 2013-05-30 MED ORDER — ONDANSETRON HCL 4 MG/2ML IJ SOLN: 4.0000 mg | Freq: Four times a day (QID) | INTRAMUSCULAR | Status: DC | PRN

## 2013-05-30 MED ORDER — ENSURE COMPLETE PO LIQD: 237.0000 mL | Freq: Two times a day (BID) | ORAL | Status: DC | PRN

## 2013-05-30 MED ORDER — SODIUM CHLORIDE 0.9 % IV BOLUS (SEPSIS)
500.0000 mL | Freq: Once | INTRAVENOUS | Status: AC
  Administered 2013-05-30: 500 mL via INTRAVENOUS

## 2013-05-30 MED ORDER — ASPIRIN-ACETAMINOPHEN-CAFFEINE 250-250-65 MG PO TABS
2.0000 | ORAL_TABLET | Freq: Four times a day (QID) | ORAL | Status: DC | PRN
  Filled 2013-05-30: qty 2

## 2013-05-30 MED ORDER — ONDANSETRON HCL 4 MG PO TABS: 4.0000 mg | ORAL_TABLET | Freq: Four times a day (QID) | ORAL | Status: DC | PRN

## 2013-05-30 MED ORDER — ENOXAPARIN SODIUM 30 MG/0.3ML ~~LOC~~ SOLN
30.0000 mg | SUBCUTANEOUS | Status: DC
  Administered 2013-05-30: 30 mg via SUBCUTANEOUS
  Filled 2013-05-30 (×2): qty 0.3

## 2013-05-30 MED ORDER — HYPROMELLOSE (GONIOSCOPIC) 2.5 % OP SOLN: 1.0000 [drp] | Freq: Three times a day (TID) | OPHTHALMIC | Status: DC | PRN

## 2013-05-30 NOTE — H&P (Signed)
[facility] History and Physical  Patient: [name]  [id number]  DOB: Nov 04, 1925  DOS: the patient was seen and examined on 05/30/2013 PCP: Andreas Blower, MD  Chief Complaint: Nausea, dizziness  HPI: [name] is a 80 y.o. 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 The patient presented with complaints of nausea and poor oral intake. She was recently hospitalized for chronic diarrhea secondary to pancolitis most likely due to radiation proctocolitis. She has been seen by gastroenterology has been placed on scheduled dose of Imodium and probiotics.  Also during the hospitalization after initial resuscitation she become volume overloaded and required IV Lasix, she was discharged on oral Lasix. After reaching home she started having persistent nausea with dry heaves. She mentions the one on the discharge day she was feeling nauseated but did not mention. She has taken her Tylenol, metoprolol, and Imodium. Other than that she has not taken any medication after reaching home. She has not had anything to eat or drink after reaching home due to persistent nausea. She has chronic abdominal pain which is persistent unchanged. She has 4-5 bowel movements after reaching home which is her usual without any blood. She complains of dizziness and lightheadedness when she sits up or stands up, along with a sense that she cannot pass out. She denies any fever, chills, headache, focal neurological deficit, chest pain, shortness of breath, burning urination.  The patient is coming from home. And at her baseline Independent for most of her  ADL.  Review of Systems: as mentioned in the history of present illness.  A Comprehensive review of the other systems is  negative.  Past Medical History  Diagnosis Date  . CAD (coronary artery disease)     2 vessel intervention 2003  . Dyslipidemia   . CHF (congestive heart failure)     single episode  . Mitral regurgitation     mild prolapse anterior & posterior leaflets  . Aortic valve sclerosis     mild moderate calcification, echo, 2009  . HTN (hypertension)     difficult to obtain BP at times.  [name] Kitchen Urethral trauma     bleeding in hospital  . Rheumatoid arthritis(714.0)     severe - deforming  . Lung disease, interstitial     related to rheumatoid arthritis, also question  of left apical nodule...followed elsewhere..my understanding stabilized  . Carotid artery disease     doppler 12/25/2010,  0-39% bilateral  . Ejection fraction     EF 60%, echo, 11/2007  . Clot     ??? apical clot in the past ?? no longer an issue  . Drug therapy     Prednisone.  . Depression   . GERD (gastroesophageal reflux disease)   . Cataract   . Aortic stenosis     Moderately severe, echo, December, 2013  . Rectal bleed 04/06/2012  . Anemia   . Edema     May, 2014  . History of radiation therapy 05/11/12-05/19/12    pelvis/inguinal lymphnodes/only able to complete 6 fx  . S/P radiation therapy 12/11/2012-12/21/2012    Rectum and anus / 12.5 Gy at 2.5 Gy per fraction x 5 fractions delivered every other day  . Degeneration macular   . 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 Cancer  rectal   Past Surgical History  Procedure Laterality Date  . Appendectomy    . Breast surgery    . Coronary angioplasty with stent placement  2003  . Colonoscopy N/A 04/08/2012    Procedure: COLONOSCOPY;  Surgeon: Winfield Cunas., MD;  Location: [facility] ENDOSCOPY;  Service: Endoscopy;  Laterality: N/A;  . Lymph node biopsy  04/12/12    right inguinal lymph node-squamous cell carcinoma  . Tonsillectomy     Social History:  reports that she has never smoked. She has never used smokeless tobacco. She reports  that she does not drink alcohol or use illicit drugs.  Allergies  Allergen Reactions  . Ativan [Lorazepam]     Confused and out of it    Family History  Problem Relation Age of Onset  . Heart disease Mother   . Heart disease Father     Prior to Admission medications   Medication Sig Start Date End Date Taking? Authorizing Provider  acetaminophen (TYLENOL) 500 MG tablet Take 1,000 mg by mouth every 6 (six) hours as needed for pain.    Historical Provider, MD  amitriptyline (ELAVIL) 50 MG tablet Take 50 mg by mouth at bedtime.    Historical Provider, MD  aspirin-acetaminophen-caffeine ([place]) [phone] MG per tablet Take 2 tablets by mouth every 6 (six) hours as needed for pain.     Historical Provider, MD  Ensure Plus (ENSURE PLUS) LIQD Take 237 mLs by mouth 2 (two) times daily as needed (for suboptimal po intake.).    Historical Provider, MD  esomeprazole (NEXIUM) 40 MG capsule Take 1 capsule (40 mg total) by mouth daily before breakfast. 06/16/12   Carlena Bjornstad, MD  feeding supplement, RESOURCE BREEZE, (RESOURCE BREEZE) LIQD Take 1 Container by mouth 2 (two) times daily between meals. 05/29/13   Annita Brod, MD  furosemide (LASIX) 20 MG tablet Take 1 tablet (20 mg total) by mouth daily. 05/29/13   Annita Brod, MD  hydroxypropyl methylcellulose (ISOPTO TEARS) 2.5 % ophthalmic solution Place 1 drop into both eyes 3 (three) times daily as needed for dry eyes.    Historical Provider, MD  leflunomide (ARAVA) 20 MG tablet Take 20 mg by mouth daily.     Historical Provider, MD  loperamide (IMODIUM) 2 MG capsule Take 2 capsules (4 mg total) by mouth as needed for diarrhea or loose stools (after each loose BM, up to 4 times a day). 05/29/13   Annita Brod, MD  loperamide (IMODIUM) 2 MG capsule Take 1 capsule (2 mg total) by mouth 2 (two) times daily. 05/29/13   Annita Brod, MD  metoprolol succinate (TOPROL-XL) 100 MG 24 hr tablet Take 1 tablet (100 mg total)  by mouth every evening. Take with or immediately following a meal. 03/27/13   Carlena Bjornstad, MD  Multiple Vitamins-Minerals (PRESERVISION AREDS PO) Take 1 capsule by mouth daily.    Historical Provider, MD  potassium chloride SA (K-DUR,KLOR-CON) 20 MEQ tablet Take 1 tablet (20 mEq total) by mouth daily. 05/29/13   Annita Brod, MD  predniSONE (DELTASONE) 5 MG tablet Take 5 mg by mouth daily.      Historical Provider, MD  saccharomyces boulardii (FLORASTOR) 250 MG capsule Take 1 capsule (250 mg total) by mouth 2 (two) times daily. 05/29/13   Annita Brod, MD    Physical Exam: Filed Vitals:   05/30/13 1830 05/30/13 1859 05/30/13 1945 05/30/13 2015  BP: 111/53 105/52 113/51 108/50  Pulse: 92 88  94 95  Temp:      TempSrc:      Resp: 22 14 25 21   Height:      Weight:      SpO2: 97% 98% 98% 97%    General: Alert, Awake and Oriented to Time, Place and Person. Appear in moderate distress Eyes: PERRL ENT: Oral Mucosa clear dry. Neck: No JVD Cardiovascular: S1 and S2 Present, aortic systolic radiating to carotid Murmur, Peripheral Pulses Present Respiratory: Bilateral Air entry equal and Decreased, Clear to Auscultation,  No Crackles, no wheezes Abdomen: Bowel Sound Present, Soft and Non tender Skin: No Rash Extremities: No Pedal edema, no calf tenderness Neurologic: Grossly Unremarkable.  Labs on Admission:  CBC:  Recent Labs Lab 05/24/13 0354 05/27/13 0531 05/30/13 1758  WBC 4.9 8.2 8.7  NEUTROABS  --   --  7.0  HGB 9.6* 11.6* 10.5*  HCT 30.6* 36.4 31.9*  MCV 100.3* 98.9 98.5  PLT 220 278 202    CMP     Component Value Date/Time   NA 137 05/30/2013 1758   NA 142 05/15/2013 0908   K 3.3* 05/30/2013 1758   K 3.4* 05/15/2013 0908   CL 95* 05/30/2013 1758   CL 108* 07/04/2012 0909   CO2 26 05/30/2013 1758   CO2 27 05/15/2013 0908   GLUCOSE 124* 05/30/2013 1758   GLUCOSE 94 05/15/2013 0908   GLUCOSE 91 07/04/2012 0909   BUN 19 05/30/2013 1758   BUN 10.0 05/15/2013 0908    CREATININE 0.48* 05/30/2013 1758   CREATININE 0.6 05/15/2013 0908   CALCIUM 9.5 05/30/2013 1758   CALCIUM 8.9 05/15/2013 0908   PROT 6.4 05/30/2013 1758   PROT 6.4 05/07/2013 0932   ALBUMIN 3.1* 05/30/2013 1758   ALBUMIN 3.2* 05/07/2013 0932   AST 37 05/30/2013 1758   AST 54* 05/07/2013 0932   ALT 11 05/30/2013 1758   ALT 11 05/07/2013 0932   ALKPHOS 95 05/30/2013 1758   ALKPHOS 136 05/07/2013 0932   BILITOT 0.2* 05/30/2013 1758   BILITOT 0.31 05/07/2013 0932   GFRNONAA 87* 05/30/2013 1758   GFRAA >90 05/30/2013 1758    No results found for this basename: LIPASE, AMYLASE,  in the last 168 hours No results found for this basename: AMMONIA,  in the last 168 hours  No results found for this basename: CKTOTAL, CKMB, CKMBINDEX, TROPONINI,  in the last 168 hours BNP (last 3 results)  Recent Labs  05/24/13 1500 05/28/13 0358  PROBNP 2482.0* 810.6*    Radiological Exams on Admission: No results found.  EKG: Independently reviewed. normal sinus rhythm, nonspecific ST and T waves changes.  Assessment/Plan Principal Problem:   Orthostatic hypotension Active Problems:   HYPERTENSION   Rheumatoid arthritis on chronic steroids   Aortic stenosis   Anal squamous cell carcinoma   Diarrhea   Dehydration   Protein-calorie malnutrition, severe   Pancolitis   Nausea & vomiting   Diastolic dysfunction   1. Orthostatic hypotension The patient is presenting with complaints of persistent nausea with poor oral intake and persistent diarrhea. This was associated with dizziness with change in position. Patient blood pressure supine 656 systolic, changes to 60 systolic when she is standing. At present her blood pressure has improved with IV hydration. Most likely due to poor oral intake with combination of Lasix and persistent diarrhea along with taking her routine metoprolol on a regular scheduled basis as the cause of her current presentation. At present she will be admitted to telemetry, I would gently  hydrate her with IV fluids, recheck her volume status in morning. I would also hold her Lasix, Toprol and leflunomide. Due to her chronic steroids intake, and missing one dose of prednisone I would give her IV Solu-Cortef for stress dose. Check troponin, lactic acid, cortisone levels.  2. Chronic diarrhea Likely secondary to radiation Continue Imodium scheduled and as needed Limited use of Zofran for nausea She had extensive workup during her last hospitalization for the same and at present she does not mention any new symptoms.  3. Aortic stenosis Patient has moderate aortic stenosis, with hypotension patient can decompensate. Therefore holding her antihypertensive medication as well as diuretics. She may require readjustment of her Toprol dose on discharge.  4. Diastolic dysfunction At present the patient appears clinically dehydrated I would hold off on Lasix Reassess volume status in morning  5. Hypokalemia Replacing  DVT Prophylaxis: subcutaneous Heparin Nutrition: Advance as tolerated  Code Status: DNR/DNI  Family Communication: Son was present at bedside, opportunity was given to ask question and all questions were answered satisfactorily at the time of interview. Disposition: Admitted to inpatient in telemetry unit.  Author: Berle Mull, MD [facility] Pager: [phone] 05/30/2013, 9:10 PM    If 7PM-7AM, please contact night-coverage [email or web address] Password [email or web address]

## 2013-05-30 NOTE — ED Notes (Signed)
Karle Starch, MD informed of orthostatic vitals

## 2013-05-30 NOTE — ED Provider Notes (Signed)
CSN: [id number]     Arrival date & time 05/30/13  1544 History   First MD Initiated Contact with Patient 05/30/13 1625     Chief Complaint  Patient presents with  . Nausea     (Consider location/radiation/quality/duration/timing/severity/associated sxs/prior Treatment) HPI Pt with extensive medical history including CAD, CHF anal cancer and pancolitis recently admitted for about 2 weeks for same, developed mild CHF during hospitalization. Also has significant malnutrition. She was discharged home yesterday but since then has continued to 'feel sick' has been nauseated but no vomiting. Continues to have chronic diarrhea, non-bloody not improved with imodium. Her only pain is chronic back pain. She denies any abd pain, fever, dysuria. She has not had anything to eat or drink since discharge yesterday.   Past Medical History  Diagnosis Date  . CAD (coronary artery disease)     2 vessel intervention 2003  . Dyslipidemia   . CHF (congestive heart failure)     single episode  . Mitral regurgitation     mild prolapse anterior & posterior leaflets  . Aortic valve sclerosis     mild moderate calcification, echo, 2009  . HTN (hypertension)     difficult to obtain BP at times.  [name] Kitchen Urethral trauma     bleeding in hospital  . Rheumatoid arthritis(714.0)     severe - deforming  . Lung disease, interstitial     related to rheumatoid arthritis, also question  of left apical nodule...followed elsewhere..my understanding stabilized  . Carotid artery disease     doppler 12/25/2010,  0-39% bilateral  . Ejection fraction     EF 60%, echo, 11/2007  . Clot     ??? apical clot in the past ?? no longer an issue  . Drug therapy     Prednisone.  . Depression   . GERD (gastroesophageal reflux disease)   . Cataract   . Aortic stenosis     Moderately severe, echo, December, 2013  . Rectal bleed 04/06/2012  . Anemia   . Edema     May, 2014  . History of radiation therapy 05/11/12-05/19/12     pelvis/inguinal lymphnodes/only able to complete 6 fx  . S/P radiation therapy 12/11/2012-12/21/2012    Rectum and anus / 12.5 Gy at 2.5 Gy per fraction x 5 fractions delivered every other day  . Degeneration macular   . 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 Cancer     rectal   Past Surgical History  Procedure Laterality Date  . Appendectomy    . Breast surgery    . Coronary angioplasty with stent placement  2003  . Colonoscopy N/A 04/08/2012    Procedure: COLONOSCOPY;  Surgeon: Winfield Cunas., MD;  Location: [facility] ENDOSCOPY;  Service: Endoscopy;  Laterality: N/A;  . Lymph node biopsy  04/12/12    right inguinal lymph node-squamous cell carcinoma  . Tonsillectomy     Family History  Problem Relation Age of Onset  . Heart disease Mother   . Heart disease Father    History  Substance Use Topics  . Smoking status: Never Smoker   . Smokeless tobacco: Never Used  . Alcohol Use: No   OB History   Grav Para Term Preterm Abortions TAB SAB Ect Mult Living                 Review of Systems  All other systems reviewed and are negative except as noted in HPI.  Allergies  Ativan  Home Medications   Prior to Admission medications   Medication Sig Start Date End Date Taking? Authorizing Provider  acetaminophen (TYLENOL) 500 MG tablet Take 1,000 mg by mouth every 6 (six) hours as needed for pain.    Historical Provider, MD  amitriptyline (ELAVIL) 50 MG tablet Take 50 mg by mouth at bedtime.    Historical Provider, MD  aspirin-acetaminophen-caffeine ([place]) [phone] MG per tablet Take 2 tablets by mouth every 6 (six) hours as needed for pain.     Historical Provider, MD  Ensure Plus (ENSURE PLUS) LIQD Take 237 mLs by mouth 2 (two) times daily as needed (for suboptimal po intake.).    Historical Provider, MD  esomeprazole (NEXIUM) 40 MG capsule Take 1 capsule (40 mg total) by mouth daily before breakfast. 06/16/12   Carlena Bjornstad, MD   feeding supplement, RESOURCE BREEZE, (RESOURCE BREEZE) LIQD Take 1 Container by mouth 2 (two) times daily between meals. 05/29/13   Annita Brod, MD  furosemide (LASIX) 20 MG tablet Take 1 tablet (20 mg total) by mouth daily. 05/29/13   Annita Brod, MD  hydroxypropyl methylcellulose (ISOPTO TEARS) 2.5 % ophthalmic solution Place 1 drop into both eyes 3 (three) times daily as needed for dry eyes.    Historical Provider, MD  leflunomide (ARAVA) 20 MG tablet Take 20 mg by mouth daily.     Historical Provider, MD  loperamide (IMODIUM) 2 MG capsule Take 2 capsules (4 mg total) by mouth as needed for diarrhea or loose stools (after each loose BM, up to 4 times a day). 05/29/13   Annita Brod, MD  loperamide (IMODIUM) 2 MG capsule Take 1 capsule (2 mg total) by mouth 2 (two) times daily. 05/29/13   Annita Brod, MD  metoprolol succinate (TOPROL-XL) 100 MG 24 hr tablet Take 1 tablet (100 mg total) by mouth every evening. Take with or immediately following a meal. 03/27/13   Carlena Bjornstad, MD  Multiple Vitamins-Minerals (PRESERVISION AREDS PO) Take 1 capsule by mouth daily.    Historical Provider, MD  potassium chloride SA (K-DUR,KLOR-CON) 20 MEQ tablet Take 1 tablet (20 mEq total) by mouth daily. 05/29/13   Annita Brod, MD  predniSONE (DELTASONE) 5 MG tablet Take 5 mg by mouth daily.      Historical Provider, MD  saccharomyces boulardii (FLORASTOR) 250 MG capsule Take 1 capsule (250 mg total) by mouth 2 (two) times daily. 05/29/13   Annita Brod, MD   BP 119/72  Pulse 103  Temp(Src) 97.9 F (36.6 C) (Oral)  Resp 18  Ht 5\' 5"  (1.651 m)  Wt 93 lb (42.185 kg)  BMI 15.48 kg/m2  SpO2 100% Physical Exam  Nursing note and vitals reviewed. Constitutional: She is oriented to person, place, and time.  cachectic  HENT:  Head: Normocephalic and atraumatic.  Eyes: EOM are normal. Pupils are equal, round, and reactive to light.  Neck: Normal range of motion. Neck supple.   Cardiovascular: Normal rate, normal heart sounds and intact distal pulses.   Pulmonary/Chest: Effort normal and breath sounds normal.  Abdominal: Soft. Bowel sounds are normal. She exhibits no distension. There is no tenderness. There is no rebound and no guarding.  Musculoskeletal: Normal range of motion. She exhibits no edema and no tenderness.  Neurological: She is alert and oriented to person, place, and time. She has normal strength. No cranial nerve deficit or sensory deficit.  Skin: Skin is warm and dry. No rash  noted.  Psychiatric: She has a normal mood and affect.    ED Course  Procedures (including critical care time) Labs Review Labs Reviewed  CBC WITH DIFFERENTIAL - Abnormal; Notable for the following:    RBC 3.24 (*)    Hemoglobin 10.5 (*)    HCT 31.9 (*)    RDW 15.8 (*)    Neutrophils Relative % 80 (*)    Lymphocytes Relative 8 (*)    All other components within normal limits  COMPREHENSIVE METABOLIC PANEL - Abnormal; Notable for the following:    Potassium 3.3 (*)    Chloride 95 (*)    Glucose, Bld 124 (*)    Creatinine, Ser 0.48 (*)    Albumin 3.1 (*)    Total Bilirubin 0.2 (*)    GFR calc non Af Amer 87 (*)    All other components within normal limits  COMPREHENSIVE METABOLIC PANEL - Abnormal; Notable for the following:    Sodium 136 (*)    Creatinine, Ser 0.43 (*)    Total Protein 5.6 (*)    Albumin 2.7 (*)    All other components within normal limits  CBC - Abnormal; Notable for the following:    RBC 2.90 (*)    Hemoglobin 9.3 (*)    HCT 29.1 (*)    MCV 100.3 (*)    RDW 16.1 (*)    All other components within normal limits  CORTISOL  TROPONIN I  LACTIC ACID, PLASMA    Imaging Review Dg Abd Portable 1v  05/30/2013   CLINICAL DATA:  History of rectal carcinoma, intractable nausea  EXAM: PORTABLE ABDOMEN - 1 VIEW  COMPARISON:  05/17/2013  FINDINGS: No acute bony abnormality is noted. Partially calcified lymph nodes are again noted in the right  hemipelvis stable from the prior CT examination. Scattered large and small bowel gas is noted. No findings of perforation are seen.  IMPRESSION: Stable appearance of the abdomen.  No acute abnormality is seen.   Electronically Signed   By: Inez Catalina M.D.   On: 05/30/2013 21:46     EKG Interpretation None      MDM   Final diagnoses:  Dehydration  Orthostatic hypotension   Pt orthostatic on arrival. Labs unchanged. Given IVF, admit for further treatment.     Charles B. Karle Starch, MD 05/31/13 2041

## 2013-05-30 NOTE — ED Notes (Signed)
Pt made aware urine specimen is needed. Pt states she is unable to go at this time.

## 2013-05-30 NOTE — ED Notes (Signed)
Pt unable to use the bathroom at this time, pt is aware of the urine sample needed

## 2013-05-30 NOTE — ED Notes (Addendum)
Since discharge patient still feels "sick"  And "does not feel good"  Nausea with no voimiting.  Just discharged yesterday from [facility] with sepsis

## 2013-05-30 NOTE — Telephone Encounter (Signed)
returned pt son call adn r/s appt per pt request...pt aware of new d.t

## 2013-05-30 NOTE — Progress Notes (Signed)
Pt admitted to the unit. Pt is alert and oriented. Pt oriented to room, staff, and call bell. Bed in lowest position. Full assessment to Epic. Call bell with in reach. Told to call for assists. Will continue to monitor.  Gailene Youkhana E Stesha Neyens  

## 2013-05-30 NOTE — Progress Notes (Signed)
Report received from Tiffany, RN.

## 2013-05-31 ENCOUNTER — Telehealth: Payer: Self-pay

## 2013-05-31 ENCOUNTER — Ambulatory Visit: Payer: Medicare Other | Admitting: Podiatry

## 2013-05-31 DIAGNOSIS — C210 Malignant neoplasm of anus, unspecified: Secondary | ICD-10-CM

## 2013-05-31 LAB — COMPREHENSIVE METABOLIC PANEL
ALT: 8 U/L (ref 0–35)
AST: 24 U/L (ref 0–37)
Albumin: 2.7 g/dL — ABNORMAL LOW (ref 3.5–5.2)
Alkaline Phosphatase: 87 U/L (ref 39–117)
BILIRUBIN TOTAL: 0.3 mg/dL (ref 0.3–1.2)
BUN: 12 mg/dL (ref 6–23)
CHLORIDE: 101 meq/L (ref 96–112)
CO2: 23 mEq/L (ref 19–32)
Calcium: 8.8 mg/dL (ref 8.4–10.5)
Creatinine, Ser: 0.43 mg/dL — ABNORMAL LOW (ref 0.50–1.10)
GFR calc non Af Amer: >90 mL/min (ref >90)
GLUCOSE: 94 mg/dL (ref 70–99)
POTASSIUM: 3.9 meq/L (ref 3.7–5.3)
Sodium: 136 mEq/L — ABNORMAL LOW (ref 137–147)
Total Protein: 5.6 g/dL — ABNORMAL LOW (ref 6.0–8.3)

## 2013-05-31 LAB — CBC
HCT: 29.1 % — ABNORMAL LOW (ref 36.0–46.0)
Hemoglobin: 9.3 g/dL — ABNORMAL LOW (ref 12.0–15.0)
MCH: 32.1 pg (ref 26.0–34.0)
MCHC: 32 g/dL (ref 30.0–36.0)
MCV: 100.3 fL — ABNORMAL HIGH (ref 78.0–100.0)
Platelets: 183 10*3/uL (ref 150–400)
RBC: 2.9 MIL/uL — ABNORMAL LOW (ref 3.87–5.11)
RDW: 16.1 % — AB (ref 11.5–15.5)
WBC: 4.7 10*3/uL (ref 4.0–10.5)

## 2013-05-31 LAB — CORTISOL: CORTISOL PLASMA: 8.6 ug/dL

## 2013-05-31 MED ORDER — METOPROLOL SUCCINATE ER 25 MG PO TB24: 25.0000 mg | ORAL_TABLET | Freq: Every evening | ORAL | Status: DC

## 2013-05-31 MED ORDER — METOPROLOL SUCCINATE ER 25 MG PO TB24
25.0000 mg | ORAL_TABLET | Freq: Every evening | ORAL | Status: DC
  Filled 2013-05-31: qty 1

## 2013-05-31 MED ORDER — LOPERAMIDE HCL 2 MG PO CAPS: 2.0000 mg | ORAL_CAPSULE | Freq: Three times a day (TID) | ORAL | Status: AC

## 2013-05-31 MED ORDER — PREDNISONE 10 MG PO TABS
10.0000 mg | ORAL_TABLET | Freq: Every day | ORAL | Status: DC
  Administered 2013-05-31: 10 mg via ORAL
  Filled 2013-05-31 (×2): qty 1

## 2013-05-31 MED ORDER — LOPERAMIDE HCL 2 MG PO CAPS: 4.0000 mg | ORAL_CAPSULE | ORAL | Status: DC | PRN

## 2013-05-31 MED ORDER — LOPERAMIDE HCL 2 MG PO CAPS: 2.0000 mg | ORAL_CAPSULE | Freq: Three times a day (TID) | ORAL | Status: DC

## 2013-05-31 NOTE — Progress Notes (Signed)
Utilization review completed.  

## 2013-05-31 NOTE — Discharge Summary (Signed)
PATIENT DETAILS Name: [name] Age: 80 y.o. Sex: female Date of Birth: 03-30-1925 MRN: [id number]. Admit Date: 05/30/2013 Admitting Physician: Berle Mull, MD GDJ:[id number] JEROME, MD  Recommendations for Outpatient Follow-up:  1. Stop Lasix, and decreased metoprolol to 25 mg daily, if continues to be orthostatic in the future, may need to stop metoprolol altogether. 2. Patient to follow up with oncology, to decide on appropriate palliative/hospice care services.  PRIMARY DISCHARGE DIAGNOSIS:  Principal Problem:   Orthostatic hypotension Active Problems:   HYPERTENSION   Rheumatoid arthritis on chronic steroids   Aortic stenosis   Anal squamous cell carcinoma   Diarrhea   Dehydration   Protein-calorie malnutrition, severe   Pancolitis   Nausea & vomiting   Diastolic dysfunction      PAST MEDICAL HISTORY: Past Medical History  Diagnosis Date  . CAD (coronary artery disease)     2 vessel intervention 2003  . Dyslipidemia   . CHF (congestive heart failure)     single episode  . Mitral regurgitation     mild prolapse anterior & posterior leaflets  . Aortic valve sclerosis     mild moderate calcification, echo, 2009  . HTN (hypertension)     difficult to obtain BP at times.  [name] Kitchen Urethral trauma     bleeding in hospital  . Rheumatoid arthritis(714.0)     severe - deforming  . Lung disease, interstitial     related to rheumatoid arthritis, also question  of left apical nodule...followed elsewhere..my understanding stabilized  . Carotid artery disease     doppler 12/25/2010,  0-39% bilateral  . Ejection fraction     EF 60%, echo, 11/2007  . Clot     ??? apical clot in the past ?? no longer an issue  . Drug therapy     Prednisone.  . Depression   . GERD (gastroesophageal reflux disease)   . Cataract   . Aortic stenosis     Moderately severe, echo, December, 2013  . Rectal bleed 04/06/2012  . Anemia   . Edema     May, 2014  . History of radiation  therapy 05/11/12-05/19/12    pelvis/inguinal lymphnodes/only able to complete 6 fx  . S/P radiation therapy 12/11/2012-12/21/2012    Rectum and anus / 12.5 Gy at 2.5 Gy per fraction x 5 fractions delivered every other day  . Degeneration macular   . 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 Cancer     rectal    DISCHARGE MEDICATIONS:   Medication List    STOP taking these medications       furosemide 20 MG tablet  Commonly known as:  LASIX      TAKE these medications       acetaminophen 500 MG tablet  Commonly known as:  TYLENOL  Take 1,000 mg by mouth every 6 (six) hours as needed for pain.     amitriptyline 50 MG tablet  Commonly known as:  ELAVIL  Take 50 mg by mouth at bedtime.     feeding supplement (RESOURCE BREEZE) Liqd  Take 1 Container by mouth 2 (two) times daily between meals.     ENSURE PLUS Liqd  Take 237 mLs by mouth 2 (two) times daily as needed (for suboptimal po intake.).     esomeprazole 40 MG capsule  Commonly known as:  NEXIUM  Take 1 capsule (40 mg total) by mouth daily before breakfast.     Sobieski [phone]  MG per tablet  Generic drug:  aspirin-acetaminophen-caffeine  Take 2 tablets by mouth every 6 (six) hours as needed for pain.     hydroxypropyl methylcellulose 2.5 % ophthalmic solution  Commonly known as:  ISOPTO TEARS  Place 1 drop into both eyes 3 (three) times daily as needed for dry eyes.     leflunomide 20 MG tablet  Commonly known as:  ARAVA  Take 20 mg by mouth daily.     loperamide 2 MG capsule  Commonly known as:  IMODIUM  Take 1 capsule (2 mg total) by mouth 3 (three) times daily. Maximum dose upto 16 mg/day     loperamide 2 MG capsule  Commonly known as:  IMODIUM  Take 2 capsules (4 mg total) by mouth as needed for diarrhea or loose stools (maximum dose upto 16 mg/day).     metoprolol succinate 25 MG 24 hr tablet  Commonly known as:  TOPROL-XL  Take 1 tablet (25 mg total) by mouth  every evening. Take with or immediately following a meal.     potassium chloride SA 20 MEQ tablet  Commonly known as:  K-DUR,KLOR-CON  Take 1 tablet (20 mEq total) by mouth daily.     predniSONE 5 MG tablet  Commonly known as:  DELTASONE  Take 5 mg by mouth daily.     PRESERVISION AREDS PO  Take 1 capsule by mouth daily.     saccharomyces boulardii 250 MG capsule  Commonly known as:  FLORASTOR  Take 1 capsule (250 mg total) by mouth 2 (two) times daily.        ALLERGIES:   Allergies  Allergen Reactions  . Ativan [Lorazepam]     Confused and out of it    BRIEF HPI:  See H&P, Labs, Consult and Test reports for all details in brief, patient was admitted for evaluation of lightheadedness and dizziness, but predominantly occurred either sitting up or standing, she was found to be profoundly orthostatic on admission and admitted for further evaluation and treatment.  CONSULTATIONS:   None  PERTINENT RADIOLOGIC STUDIES: Ct Abdomen Pelvis W Contrast  05/17/2013   ADDENDUM REPORT: 05/17/2013 13:42  ADDENDUM: Please add the following to the impression section:  8.  Sludge ball or non calcified gallstone.   Electronically Signed   By: Lorin Picket M.D.   On: 05/17/2013 13:42   05/17/2013   CLINICAL DATA:  Rectal cancer with recent pan colitis and intractable diarrhea. Persistent diarrhea and nausea. Loss of appetite and generalized weakness.  EXAM: CT ABDOMEN AND PELVIS WITH CONTRAST  TECHNIQUE: Multidetector CT imaging of the abdomen and pelvis was performed using the standard protocol following bolus administration of intravenous contrast.  CONTRAST:  97mL OMNIPAQUE IOHEXOL 300 MG/ML  SOLN  COMPARISON:  CT ABD/PELVIS W CM dated 04/29/2013  FINDINGS: Lung bases show tiny bilateral effusions with compressive atelectasis in both lower lobes. Large hiatal hernia occupies the posterior aspect of the lower right hemi thorax, incompletely imaged. Heart is at the upper limits of normal in size.  Mitral annulus and coronary artery calcification. No pericardial effusion.  Liver measures 18.9 cm. Periportal edema and dilatation of the biliary tree, as before. Common bile duct measures up to 11 mm, stable. A 2.0 cm rounded soft tissue lesion is seen in the gallbladder, as before. Adrenal glands are unremarkable. Right renal cortical scarring. A 7 mm low-attenuation lesion in the lower pole left kidney is unchanged and likely a cyst. Spleen is unremarkable. The majority of the stomach  is located within the the above described hiatal hernia. Duodenal diverticulum. Small bowel is otherwise unremarkable. There is diffuse wall thickening involving the colon presacral edema has worsened in the interval.  Atherosclerotic calcification of the arterial vasculature without abdominal aortic aneurysm. Abdominal retroperitoneal lymph nodes measure up to 11 mm in the left periaortic station (image 30), stable. Partially calcified right external iliac nodal mass measures 2.7 x 4.4 cm, stable. A heterogeneous mass is seen in the right inguinal region, measuring 3.2 x 4.6 cm, likely stable. Right inguinal hernia contains fat and nonobstructed small bowel.  No worrisome lytic or sclerotic lesions. Degenerative changes are seen in the spine.  IMPRESSION: 1. Diffuse colonic wall thickening, unchanged and indicative of pan colitis. Associated presacral edema has increased slightly. 2. Tiny bilateral effusions with compressive atelectasis in both lower lobes. 3. Periportal edema and biliary ductal dilatation are unchanged. 4. Heterogeneous and partially calcified nodal masses in the right external iliac chain and right inguinal region, stable. 5. Large hiatal hernia. 6. Hepatomegaly.  Electronically Signed: By: Lorin Picket M.D. On: 05/17/2013 13:31   Dg Abd Portable 1v  05/30/2013   CLINICAL DATA:  History of rectal carcinoma, intractable nausea  EXAM: PORTABLE ABDOMEN - 1 VIEW  COMPARISON:  05/17/2013  FINDINGS: No acute  bony abnormality is noted. Partially calcified lymph nodes are again noted in the right hemipelvis stable from the prior CT examination. Scattered large and small bowel gas is noted. No findings of perforation are seen.  IMPRESSION: Stable appearance of the abdomen.  No acute abnormality is seen.   Electronically Signed   By: Inez Catalina M.D.   On: 05/30/2013 21:46     PERTINENT LAB RESULTS: CBC:  Recent Labs  05/30/13 1758 05/31/13 0721  WBC 8.7 4.7  HGB 10.5* 9.3*  HCT 31.9* 29.1*  PLT 202 183   CMET CMP     Component Value Date/Time   NA 136* 05/31/2013 0721   NA 142 05/15/2013 0908   K 3.9 05/31/2013 0721   K 3.4* 05/15/2013 0908   CL 101 05/31/2013 0721   CL 108* 07/04/2012 0909   CO2 23 05/31/2013 0721   CO2 27 05/15/2013 0908   GLUCOSE 94 05/31/2013 0721   GLUCOSE 94 05/15/2013 0908   GLUCOSE 91 07/04/2012 0909   BUN 12 05/31/2013 0721   BUN 10.0 05/15/2013 0908   CREATININE 0.43* 05/31/2013 0721   CREATININE 0.6 05/15/2013 0908   CALCIUM 8.8 05/31/2013 0721   CALCIUM 8.9 05/15/2013 0908   PROT 5.6* 05/31/2013 0721   PROT 6.4 05/07/2013 0932   ALBUMIN 2.7* 05/31/2013 0721   ALBUMIN 3.2* 05/07/2013 0932   AST 24 05/31/2013 0721   AST 54* 05/07/2013 0932   ALT 8 05/31/2013 0721   ALT 11 05/07/2013 0932   ALKPHOS 87 05/31/2013 0721   ALKPHOS 136 05/07/2013 0932   BILITOT 0.3 05/31/2013 0721   BILITOT 0.31 05/07/2013 0932   GFRNONAA >90 05/31/2013 0721   GFRAA >90 05/31/2013 0721    GFR Estimated Creatinine Clearance: 33.3 ml/min (by C-G formula based on Cr of 0.43). No results found for this basename: LIPASE, AMYLASE,  in the last 72 hours  Recent Labs  05/30/13 2048  TROPONINI <0.30   No components found with this basename: POCBNP,  No results found for this basename: DDIMER,  in the last 72 hours No results found for this basename: HGBA1C,  in the last 72 hours No results found for this basename: CHOL, HDL, LDLCALC, TRIG,  CHOLHDL, LDLDIRECT,  in the last 72 hours No results found  for this basename: TSH, T4TOTAL, FREET3, T3FREE, THYROIDAB,  in the last 72 hours No results found for this basename: VITAMINB12, FOLATE, FERRITIN, TIBC, IRON, RETICCTPCT,  in the last 72 hours Coags: No results found for this basename: PT, INR,  in the last 72 hours Microbiology: Recent Results (from the past 240 hour(s))  URINE CULTURE     Status: None   Collection Time    05/21/13  7:26 PM      Result Value Ref Range Status   Specimen Description URINE, CLEAN CATCH   Final   Special Requests Zosyn, Arava Normal   Final   Culture  Setup Time     Final   Value: 05/22/2013 02:02     Performed at [facility] Count     Final   Value: NO GROWTH     Performed at [facility]   Culture     Final   Value: NO GROWTH     Performed at [facility]   Report Status 05/22/2013 FINAL   Final     BRIEF HOSPITAL COURSE:   Principal Problem:   Orthostatic hypotension - Likely secondary to ongoing diarrhea, use of Lasix and metoprolol. - Since patient was profoundly orthostatic on admission, Lasix was held, patient was hydrated overnight. This morning she was easily able to stand up, and actually ambulated in the hallway with nursing staff. - Since she does have chronic diarrhea, we will stop Lasix, decrease her metoprolol to 25 mg. Hopefully this will be able to control her orthostatic symptoms, in the future if she continues to be orthostatic has similar recurrence of her symptoms, metoprolol and will need to be discontinued permanently. - Since admitting symptoms have resolved, she is being discharged home with home health services.  Active Problems: Diarrhea - Patient was just discharged from this facility a few days back after extensive workup for diarrhea. Apparently this has been going on for at least 6 weeks. 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 She has been taking Imodium at home,  but only as needed, on admission she was started on Imodium twice a day, she claims no further diarrhea since yesterday. I have asked patient to take Imodium scheduled 2 mg 3 times a day, and when necessary on top of that to a max dose of 16 mg a day. We'll continue with probiotic on discharge, since her diarrhea seems to be much better since admission without her having any bowel movement, and since this is a chronic issue, she is stable for discharge. Further workup to be done in the outpatient setting if needed.    HYPERTENSION - Even issues with orthostatic hypotension, her stop Lasix, have decreased metoprolol to 25 mg daily. - Need continued reassessment at outpatient visits for further optimization.    Rheumatoid arthritis on chronic steroids - Continue prednisone and leflunomide    Aortic stenosis - Stable, and tinea outpatient periodic monitoring    Anal squamous cell carcinoma - Follows with medical and radiation oncology, currently on observation. Family/at bedside inquiring about palliative followup, have spoken with Dr. Rosie Fate patient's primary oncologist, he will speak with family and arrange appropriate palliative services on followup with him. He would try and get the patient to his office before her next scheduled appointment.  Congestive heart failure-diastolic dysfunction - Compensated.  TODAY-DAY OF DISCHARGE:  Subjective:  Ura Yingling today has no headache,no chest abdominal pain,no new weakness tingling or numbness, feels much better wants to go home today.   Objective:   Blood pressure 103/66, pulse 97, temperature 97.3 F (36.3 C), temperature source Oral, resp. rate 18, height 5\' 5"  (1.651 m), weight 40.96 kg (90 lb 4.8 oz), SpO2 97.00%.  Intake/Output Summary (Last 24 hours) at 05/31/13 1149 Last data filed at 05/31/13 0600  Gross per 24 hour  Intake 603.75 ml  Output      0 ml  Net 603.75 ml   Filed Weights   05/30/13 1549 05/30/13 2141 05/31/13 0457   Weight: 42.185 kg (93 lb) 40.189 kg (88 lb 9.6 oz) 40.96 kg (90 lb 4.8 oz)    Exam Awake Alert, Oriented *3, No new F.N deficits, Normal affect Horatio.AT,PERRAL Supple Neck,No JVD, No cervical lymphadenopathy appriciated.  Symmetrical Chest wall movement, Good air movement bilaterally, CTAB RRR,No Gallops,Rubs or new Murmurs, No Parasternal Heave +ve B.Sounds, Abd Soft, Non tender, No organomegaly appriciated, No rebound -guarding or rigidity. No Cyanosis, Clubbing or edema, No new Rash or bruise  DISCHARGE CONDITION: Stable  DISPOSITION: Home with home health services  DISCHARGE INSTRUCTIONS:    Activity:  As tolerated with Full fall precautions use walker/cane & assistance as needed  Diet recommendation: Heart Healthy diet      Discharge Orders   Future Appointments Provider Department Dept Phone   06/14/2013 9:30 AM [place], [facility] [place]   06/19/2013 9:00 AM [facility] [place] [phone]   06/19/2013 9:30 AM [facility] Provider [facility] [phone]   08/02/2013 2:00 PM Thea Silversmith, [facility] [phone]   Future Orders Complete By Expires   Call MD for:  persistant dizziness or light-headedness  As directed    Diet - low sodium heart healthy  As directed    Increase activity slowly  As directed       Follow-up Information   Follow up with [place] [place])    Contact information:   [address] [place] [address] [phone]       Follow up with Andreas Blower, MD. Schedule an appointment as soon as possible for a visit in 1 week.   Specialty:  Family Medicine   Contact information:   [place] [address] [phone]       Follow up with CHISM, DAVID, MD. Schedule an appointment as soon as possible for a visit in 1 week.   Specialty:   Internal Medicine   Contact information:   [place] [address] [phone]         Total Time spent on discharge equals 45 minutes.  Signed: Henreitta Leber Ghimire 05/31/2013 11:49 AM

## 2013-05-31 NOTE — Telephone Encounter (Signed)
Referral to [facility] [place] [facility] faxed per Dr Juliann Mule

## 2013-05-31 NOTE — Evaluation (Signed)
Physical Therapy Evaluation Patient Details Name: [name] MRN: [id number] DOB: March 29, 1925 Today's Date: 05/31/2013   History of Present Illness  [name] is a 80 y.o. 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 chief complaint is dizziness and nauseau   Clinical Impression  Pt adm with the above dx. Pt amb without AD at home PTA however demo's more stability and increased safety using RW on this date. Pt presents with higher level balance deficits increasing her risk for falls. It is recommended that the pt have supervision 24 hr and HHPT to maximize independence and safety. Pt repeatedly denied the need for 24 hr supervision. Pt educated on falls prevention and safety awareness. Pt to benefit from skilled acute PT to address limitations listed below.     Follow Up Recommendations Home health PT;Supervision/Assistance - 24 hour    Equipment Recommendations  None recommended by PT    Recommendations for Other Services       Precautions / Restrictions Precautions Precautions: Fall Restrictions Weight Bearing Restrictions: No      Mobility  Bed Mobility               General bed mobility comments: not assessed pt in chair upon arrival   Transfers Overall transfer level: Needs assistance Equipment used: Rolling walker (2 wheeled) Transfers: Sit to/from Stand Sit to Stand: Min guard         General transfer comment: verbal cues for safety   Ambulation/Gait Ambulation/Gait assistance: Min guard Ambulation Distance (Feet): 125 Feet Assistive device: Rolling walker (2 wheeled);None Gait Pattern/deviations: Step-through pattern;Decreased stride length;Narrow base of support Gait velocity: slow; guarded for falls and pain   General Gait  Details: pt requested to use RW for additional support. pt demo'd increased stability with RW. pt reports amb on this date is similar to baseline stating she "goes where I need to and no further" within her home. pt reports Bilat feet pain during amb. pt performed head turns (Lt/Rt/up/down) req to slow down. pt able to change speeds of gait fast to slow in a safe maner with no LOB. pt required inc time to complete a 180 deg turn and stop.   Stairs            Wheelchair Mobility    Modified Rankin (Stroke Patients Only)       Balance Overall balance assessment: Needs assistance Sitting-balance support: Feet supported;No upper extremity supported Sitting balance-Leahy Scale: Fair     Standing balance support: No upper extremity supported;During functional activity Standing balance-Leahy Scale: Fair Standing balance comment: pt able to stand without UE support for 5 min. pt demo's more stability while standing with RW.                              Pertinent Vitals/Pain Pt denies pain, dizziness, or feeling nauseous     Home Living Family/patient expects to be discharged to:: Private residence Living Arrangements: Children Available Help at Discharge: Family;Available PRN/intermittently Type of Home: House Home Access: Level entry     Home Layout: One level Home Equipment: Walker - 2 wheels;Bedside commode;Toilet riser Additional Comments: Pt. does not use RW at home.     Prior Function Level of Independence: Independent with assistive device(s)  Comments: does not use AD but furniture walks     Hand Dominance        Extremity/Trunk Assessment   Upper Extremity Assessment: Generalized weakness;RUE deficits/detail;LUE deficits/detail RUE Deficits / Details: RA changes in Bilat UEs and hands.      LUE Deficits / Details: RA changes in Bilat UEs and hands.    Lower Extremity Assessment: Generalized weakness RLE Deficits / Details: RA  changes in feet causing discomfort with ambulation LLE Deficits / Details: RA changes in feet causing discomfort with ambulation     Communication   Communication: HOH  Cognition Arousal/Alertness: Awake/alert Behavior During Therapy: WFL for tasks assessed/performed Overall Cognitive Status: Within Functional Limits for tasks assessed                      General Comments General comments (skin integrity, edema, etc.): pt very frail. pt denies dizziness or lightheadedness. pt educated on importance of having 24 hr supervision upon d/c and the importance of HHPT. pt repeatedly denied the need for someone to home with her throughout the day. Pt educated on safety and fall preventions. pt reports no pain. pt performed part of DGI however limited to fatigue and Bilat foot pain    Exercises        Assessment/Plan    PT Assessment Patient needs continued PT services  PT Diagnosis Difficulty walking   PT Problem List Decreased strength;Decreased activity tolerance;Decreased balance;Decreased mobility;Decreased coordination;Decreased knowledge of use of DME;Decreased safety awareness  PT Treatment Interventions DME instruction;Gait training;Stair training;Functional mobility training;Therapeutic activities;Therapeutic exercise;Balance training;Neuromuscular re-education;Patient/family education   PT Goals (Current goals can be found in the Care Plan section) Acute Rehab PT Goals Patient Stated Goal: go home PT Goal Formulation: With patient Time For Goal Achievement: 06/07/13 Potential to Achieve Goals: Good    Frequency Min 2X/week   Barriers to discharge        Co-evaluation               End of Session Equipment Utilized During Treatment: Gait belt (RW) Activity Tolerance: Patient tolerated treatment well;Patient limited by pain Patient left: in chair;with call bell/phone within reach;with family/visitor present Nurse Communication: Mobility status          Time: 5597-4163 PT Time Calculation (min): 21 min   Charges:   PT Evaluation $Initial PT Evaluation Tier I: 1 Procedure PT Treatments $Gait Training: 8-22 mins   PT G Codes:         Manley Mason SPT  [place] [facility] [phone]  05/31/2013, 1:05 PM

## 2013-05-31 NOTE — Evaluation (Signed)
Physical Therapy Evaluation Patient Details Name: [name] MRN: [id number] DOB: 1925/04/17 Today's Date: 05/31/2013   History of Present Illness  [name] is a 80 y.o. 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 chief complaint is dizziness and nauseau   Clinical Impression  Pt adm with the above dx. Pt amb without AD at home PTA however demo's more stability and increased safety using RW on this date. Pt presents with higher level balance deficits increasing her risk for falls. It is recommended that the pt have supervision 24 hr and HHPT to maximize independence and safety. Pt repeatedly denied the need for 24 hr supervision. Pt educated on falls prevention and safety awareness. Pt to benefit from skilled acute PT to address limitations listed below.     Follow Up Recommendations Home health PT;Supervision/Assistance - 24 hour    Equipment Recommendations  None recommended by PT    Recommendations for Other Services       Precautions / Restrictions Precautions Precautions: Fall Restrictions Weight Bearing Restrictions: No      Mobility  Bed Mobility               General bed mobility comments: not assessed pt in chair upon arrival   Transfers Overall transfer level: Needs assistance Equipment used: Rolling walker (2 wheeled) Transfers: Sit to/from Stand Sit to Stand: Min guard         General transfer comment: verbal cues for safety   Ambulation/Gait Ambulation/Gait assistance: Min guard Ambulation Distance (Feet): 125 Feet Assistive device: Rolling walker (2 wheeled);None Gait Pattern/deviations: Step-through pattern;Decreased stride length;Narrow base of support Gait velocity: slow; guarded for falls and pain   General Gait  Details: pt requested to use RW for additional support. pt demo'd increased stability with RW. pt reports amb on this date is similar to baseline stating she "goes where I need to and no further" within her home. pt reports Bilat feet pain during amb. pt performed head turns (Lt/Rt/up/down) req to slow down. pt able to change speeds of gait fast to slow in a safe maner with no LOB. pt required inc time to complete a 180 deg turn and stop.   Stairs            Wheelchair Mobility    Modified Rankin (Stroke Patients Only)       Balance Overall balance assessment: Needs assistance Sitting-balance support: Feet supported;No upper extremity supported Sitting balance-Leahy Scale: Fair     Standing balance support: No upper extremity supported;During functional activity Standing balance-Leahy Scale: Fair Standing balance comment: pt able to stand without UE support for 5 min. pt demo's more stability while standing with RW.                              Pertinent Vitals/Pain Pt denies pain, dizziness, or feeling nauseous     Home Living Family/patient expects to be discharged to:: Private residence Living Arrangements: Children Available Help at Discharge: Family;Available PRN/intermittently Type of Home: House Home Access: Level entry     Home Layout: One level Home Equipment: Walker - 2 wheels;Bedside commode;Toilet riser Additional Comments: Pt. does not use RW at home.     Prior Function Level of Independence: Independent with assistive device(s)  Comments: does not use AD but furniture walks     Hand Dominance        Extremity/Trunk Assessment   Upper Extremity Assessment: Generalized weakness;RUE deficits/detail;LUE deficits/detail RUE Deficits / Details: RA changes in Bilat UEs and hands.      LUE Deficits / Details: RA changes in Bilat UEs and hands.    Lower Extremity Assessment: Generalized weakness RLE Deficits / Details: RA  changes in feet causing discomfort with ambulation LLE Deficits / Details: RA changes in feet causing discomfort with ambulation     Communication   Communication: HOH  Cognition Arousal/Alertness: Awake/alert Behavior During Therapy: WFL for tasks assessed/performed Overall Cognitive Status: Within Functional Limits for tasks assessed                      General Comments General comments (skin integrity, edema, etc.): pt very frail. pt denies dizziness or lightheadedness. pt educated on importance of having 24 hr supervision upon d/c and the importance of HHPT. pt repeatedly denied the need for someone to home with her throughout the day. Pt educated on safety and fall preventions. pt reports no pain. pt performed part of DGI however limited to fatigue and Bilat foot pain    Exercises        Assessment/Plan    PT Assessment Patient needs continued PT services  PT Diagnosis Difficulty walking   PT Problem List Decreased strength;Decreased activity tolerance;Decreased balance;Decreased mobility;Decreased coordination;Decreased knowledge of use of DME;Decreased safety awareness  PT Treatment Interventions DME instruction;Gait training;Stair training;Functional mobility training;Therapeutic activities;Therapeutic exercise;Balance training;Neuromuscular re-education;Patient/family education   PT Goals (Current goals can be found in the Care Plan section) Acute Rehab PT Goals Patient Stated Goal: go home PT Goal Formulation: With patient Time For Goal Achievement: 06/07/13 Potential to Achieve Goals: Good    Frequency Min 2X/week   Barriers to discharge        Co-evaluation               End of Session Equipment Utilized During Treatment: Gait belt (RW) Activity Tolerance: Patient tolerated treatment well;Patient limited by pain Patient left: in chair;with call bell/phone within reach;with family/visitor present Nurse Communication: Mobility status          Time: 3716-9678 PT Time Calculation (min): 21 min   Charges:         PT G Codes:          Manley Mason SPT 05/31/2013, 12:55 PM

## 2013-05-31 NOTE — Progress Notes (Signed)
[name] to be D/C'd Home per MD order.  Discussed with the patient and all questions fully answered.    Medication List    STOP taking these medications       furosemide 20 MG tablet  Commonly known as:  LASIX      TAKE these medications       acetaminophen 500 MG tablet  Commonly known as:  TYLENOL  Take 1,000 mg by mouth every 6 (six) hours as needed for pain.     amitriptyline 50 MG tablet  Commonly known as:  ELAVIL  Take 50 mg by mouth at bedtime.     feeding supplement (RESOURCE BREEZE) Liqd  Take 1 Container by mouth 2 (two) times daily between meals.     ENSURE PLUS Liqd  Take 237 mLs by mouth 2 (two) times daily as needed (for suboptimal po intake.).     esomeprazole 40 MG capsule  Commonly known as:  NEXIUM  Take 1 capsule (40 mg total) by mouth daily before breakfast.     EXCEDRIN EXTRA STRENGTH 250-250-65 MG per tablet  Generic drug:  aspirin-acetaminophen-caffeine  Take 2 tablets by mouth every 6 (six) hours as needed for pain.     hydroxypropyl methylcellulose 2.5 % ophthalmic solution  Commonly known as:  ISOPTO TEARS  Place 1 drop into both eyes 3 (three) times daily as needed for dry eyes.     leflunomide 20 MG tablet  Commonly known as:  ARAVA  Take 20 mg by mouth daily.     loperamide 2 MG capsule  Commonly known as:  IMODIUM  Take 1 capsule (2 mg total) by mouth 3 (three) times daily. Maximum dose upto 16 mg/day     loperamide 2 MG capsule  Commonly known as:  IMODIUM  Take 2 capsules (4 mg total) by mouth as needed for diarrhea or loose stools (maximum dose upto 16 mg/day).     metoprolol succinate 25 MG 24 hr tablet  Commonly known as:  TOPROL-XL  Take 1 tablet (25 mg total) by mouth every evening. Take with or immediately following a meal.     potassium chloride SA 20 MEQ tablet  Commonly known as:  K-DUR,KLOR-CON  Take 1 tablet (20 mEq total) by mouth daily.     predniSONE 5 MG tablet  Commonly known as:  DELTASONE  Take 5  mg by mouth daily.     PRESERVISION AREDS PO  Take 1 capsule by mouth daily.     saccharomyces boulardii 250 MG capsule  Commonly known as:  FLORASTOR  Take 1 capsule (250 mg total) by mouth 2 (two) times daily.        VVS, Skin clean, dry and intact without evidence of skin break down, no evidence of skin tears noted. IV catheter discontinued intact. Site without signs and symptoms of complications. Dressing and pressure applied.  An After Visit Summary was printed and given to the patient.  D/c education completed with patient/family including follow up instructions, medication list, d/c activities limitations if indicated, with other d/c instructions as indicated by MD - patient able to verbalize understanding, all questions fully answered.   Patient instructed to return to ED, call 911, or call MD for any changes in condition.   Patient escorted via [place], and D/C home via private auto.  Gracy Bruins 05/31/2013 11:52 AM

## 2013-05-31 NOTE — Telephone Encounter (Signed)
Hospice called that pt is going home with advanced home care. The pt will see how she does and will ask for hospice referral if and when it is necessary.

## 2013-05-31 NOTE — Care Management Note (Signed)
    Page 1 of 1   05/31/2013     11:15:17 AM CARE MANAGEMENT NOTE 05/31/2013  Patient:  [name], [name]   Account Number:  [id number]  Date Initiated:  05/31/2013  Documentation initiated by:  Tomi Bamberger  Subjective/Objective Assessment:   dx orthostatic hypotension, dizzy  admit- lives with son.     Action/Plan:   pt /ot eval- rec hhpt/ot- patient is refusin hhot and aide, convinced her to take hhrn and hhpt.   Anticipated DC Date:  05/31/2013   Anticipated DC Plan:  [place]  CM consult      [facility] Choice  HOME HEALTH   Choice offered to / List presented to:  C-1 Patient        [place] arranged  [place]      [place].   Status of service:  Completed, signed off Medicare Important Message given?   (If response is "NO", the following Medicare IM given date fields will be blank) Date Medicare IM given:   Date Additional Medicare IM given:    Discharge Disposition:  [place]  Per UR Regulation:  Reviewed for med. necessity/level of care/duration of stay  If discussed at [place] of Stay Meetings, dates discussed:    Comments:  05/31/13 [place], BSN [phone] patient lives with son, pt/ot eval rec hhpt and hhot, patient refused hhot.  Patient also has order for hhrn / aide, patient also refused the hhaide.  NCM convinced patient to continue with hhrn and hhpt, she chose [facility] ,stating she had them in the past.  Referral maded to [facility], Butch Penny notified.  Soc will begin 24-48 hrs post discharge.

## 2013-05-31 NOTE — Progress Notes (Addendum)
INITIAL NUTRITION ASSESSMENT  DOCUMENTATION CODES Per approved criteria  -Severe malnutrition in the context of chronic illness -Underweight   Pt meets criteria for severe MALNUTRITION in the context of chronic illness as evidenced by 10% weight loss in 3 month period, PO intake <75% for > one month, severe muscle wasting and subcutaneous fat loss.  INTERVENTION: Continue Resource Breeze po BID, each supplement provides 250 kcal and 9 grams of protein Downgrade to Dysphagia 3, per pt request Consider Palliative Care consult per son's request RD to continue to follow nutrition care plan.  NUTRITION DIAGNOSIS: Inadequate energy intake related to decreased appetite/loose stools as evidenced by PO intake <75%, unintentional wt loss.   Goal: Pt to meet >/= 90% of their estimated nutrition needs   Monitor:  Total protein/energy intake, labs, weights, GI profile  Reason for Assessment: Malnutrition Screening Tool  80 y.o. female  Admitting Dx: Orthostatic hypotension  ASSESSMENT: Patient is 80 year old female with history of CAD, hypertension, rectal cancer and CHF, who was recently discharged with pancolitis. Admitted currently with nausea and poor po intake.Work-up reveals orthostatic hypotension.  Per MD, chronic diarrhea is 2/2 radiation.  Pt with ongoing very poor appetite. Son at bedside confirms that this is ongoing. He states that he doesn't really know what else to do for her. He is asking about Palliative Care speaking with him, so he knows what to do when she is at home and not thriving - discussed with RN and Dr. Sloan Leiter.  Nutrition Focused Physical Exam:  Subcutaneous Fat:  Orbital Region: severe depletion Upper Arm Region: severe depletion Thoracic and Lumbar Region: severe depletion  Muscle:  Temple Region: severe depletion Clavicle Bone Region: severe depletion Clavicle and Acromion Bone Region: severe depletion Scapular Bone Region: severe depletion Dorsal  Hand: severe depletion Patellar Region: severe depletion Anterior Thigh Region: severe depletion Posterior Calf Region: severe depletion  Edema: none  Pt meets criteria for severe MALNUTRITION in the context of chronic illness as evidenced by severe fat and muscle mass loss.  Height: Ht Readings from Last 1 Encounters:  05/30/13 5\' 5"  (1.651 m)    Weight: Wt Readings from Last 1 Encounters:  05/31/13 90 lb 4.8 oz (40.96 kg)    Ideal Body Weight: 125 lbs  % Ideal Body Weight: 72%  Wt Readings from Last 10 Encounters:  05/31/13 90 lb 4.8 oz (40.96 kg)  05/29/13 87 lb 4.8 oz (39.6 kg)  05/15/13 96 lb 3.2 oz (43.636 kg)  04/29/13 95 lb 5 oz (43.233 kg)  03/08/13 98 lb 1.6 oz (44.498 kg)  02/09/13 104 lb (47.174 kg)  02/06/13 105 lb 11.2 oz (47.945 kg)  11/16/12 104 lb 3.2 oz (47.265 kg)  11/02/12 104 lb 1.6 oz (47.219 kg)  09/01/12 100 lb 11.2 oz (45.677 kg)    Usual Body Weight: 105 lb  % Usual Body Weight: 86%  BMI:  Body mass index is 15.03 kg/(m^2). Underweight  Estimated Nutritional Needs: Kcal: 1500-1700 Protein: 65-80 Fluid: >/= 1500 ml/daily  Skin: WNL  Diet Order: General  EDUCATION NEEDS: -No education needs identified at this time   Intake/Output Summary (Last 24 hours) at 05/31/13 0911 Last data filed at 05/31/13 0600  Gross per 24 hour  Intake 603.75 ml  Output      0 ml  Net 603.75 ml    Last BM: 4/22  Labs:   Recent Labs Lab 05/26/13 0333 05/27/13 0531 05/28/13 0358 05/30/13 1758 05/31/13 0721  NA 139 136* 136* 137 136*  K  3.1* 4.2 3.7 3.3* 3.9  CL 96 99 99 95* 101  CO2 27 25 24 26 23   BUN 11 12 18 19 12   CREATININE 0.40* 0.46* 0.54 0.48* 0.43*  CALCIUM 9.1 9.4 8.9 9.5 8.8  MG 1.8 1.8 1.8  --   --   GLUCOSE 102* 92 97 124* 94    CBG (last 3)  No results found for this basename: GLUCAP,  in the last 72 hours  Scheduled Meds: . amitriptyline  50 mg Oral QHS  . enoxaparin (LOVENOX) injection  30 mg Subcutaneous Q24H   . feeding supplement (RESOURCE BREEZE)  1 Container Oral BID BM  . leflunomide  20 mg Oral Daily  . loperamide  2 mg Oral BID  . metoprolol succinate  75 mg Oral QPM  . pantoprazole  40 mg Oral Daily  . predniSONE  10 mg Oral QAC breakfast  . saccharomyces boulardii  250 mg Oral BID  . sodium chloride  3 mL Intravenous Q12H    Continuous Infusions: . 0.9 % NaCl with KCl 20 mEq / L 75 mL/hr at 05/30/13 2157    Past Medical History  Diagnosis Date  . CAD (coronary artery disease)     2 vessel intervention 2003  . Dyslipidemia   . CHF (congestive heart failure)     single episode  . Mitral regurgitation     mild prolapse anterior & posterior leaflets  . Aortic valve sclerosis     mild moderate calcification, echo, 2009  . HTN (hypertension)     difficult to obtain BP at times.  [name] Kitchen Urethral trauma     bleeding in hospital  . Rheumatoid arthritis(714.0)     severe - deforming  . Lung disease, interstitial     related to rheumatoid arthritis, also question  of left apical nodule...followed elsewhere..my understanding stabilized  . Carotid artery disease     doppler 12/25/2010,  0-39% bilateral  . Ejection fraction     EF 60%, echo, 11/2007  . Clot     ??? apical clot in the past ?? no longer an issue  . Drug therapy     Prednisone.  . Depression   . GERD (gastroesophageal reflux disease)   . Cataract   . Aortic stenosis     Moderately severe, echo, December, 2013  . Rectal bleed 04/06/2012  . Anemia   . Edema     May, 2014  . History of radiation therapy 05/11/12-05/19/12    pelvis/inguinal lymphnodes/only able to complete 6 fx  . S/P radiation therapy 12/11/2012-12/21/2012    Rectum and anus / 12.5 Gy at 2.5 Gy per fraction x 5 fractions delivered every other day  . Degeneration macular   . 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 Cancer     rectal    Past Surgical History  Procedure Laterality Date  . Appendectomy    . Breast surgery    .  Coronary angioplasty with stent placement  2003  . Colonoscopy N/A 04/08/2012    Procedure: COLONOSCOPY;  Surgeon: Winfield Cunas., MD;  Location: [facility] ENDOSCOPY;  Service: Endoscopy;  Laterality: N/A;  . Lymph node biopsy  04/12/12    right inguinal lymph node-squamous cell carcinoma  . Tonsillectomy      Inda Coke MS, RD, LDN Inpatient Registered Dietitian Pager: [phone] After-hours pager: [phone]

## 2013-06-01 ENCOUNTER — Ambulatory Visit: Payer: Medicare Other

## 2013-06-01 ENCOUNTER — Other Ambulatory Visit: Payer: Medicare Other

## 2013-06-08 ENCOUNTER — Telehealth: Payer: Self-pay | Admitting: *Deleted

## 2013-06-08 ENCOUNTER — Other Ambulatory Visit: Payer: Self-pay

## 2013-06-08 ENCOUNTER — Other Ambulatory Visit: Payer: Self-pay | Admitting: Internal Medicine

## 2013-06-08 DIAGNOSIS — R197 Diarrhea, unspecified: Secondary | ICD-10-CM

## 2013-06-08 DIAGNOSIS — C210 Malignant neoplasm of anus, unspecified: Secondary | ICD-10-CM

## 2013-06-08 DIAGNOSIS — E860 Dehydration: Secondary | ICD-10-CM

## 2013-06-08 MED ORDER — POTASSIUM CHLORIDE CRYS ER 20 MEQ PO TBCR: 40.0000 meq | EXTENDED_RELEASE_TABLET | Freq: Every day | ORAL | Status: DC

## 2013-06-08 MED ORDER — SODIUM CHLORIDE 0.9 % IV SOLN: 500.0000 mL | Freq: Once | INTRAVENOUS | Status: DC

## 2013-06-08 MED ORDER — DIPHENOXYLATE-ATROPINE 2.5-0.025 MG PO TABS: 1.0000 | ORAL_TABLET | Freq: Four times a day (QID) | ORAL | Status: DC | PRN

## 2013-06-08 NOTE — Telephone Encounter (Signed)
Verbal order received and read back from Dr. Juliann Mule for patient to receive IVF tomorrow (500 ml over 2 hrs) and he'll see her next week.  Arranged with 10:15 am infusion appointment.  Burns Spain NP ordered increase potassium to two pills and lomotil for bowels.  Patient to go to ER if in distress.     Jane Canary with the above information.  Reports mom has two potassium pills left.  Refill with new instructions called to pharmacy Explained to go to emergency room with any confusion, chest pain, palpitations, s.o.b. Verbalized understanding.  Asked if valet works weekends.  Valet not here on weekends.  Legrand Como will obtain wheelchair from lobby to bring Mom to infusion room tomorrow.

## 2013-06-08 NOTE — Telephone Encounter (Signed)
Provider input needed: F/U and IVF for dehydration  Reason for call: [facility] nurse recommended I call for mom to be seen ASAP  Constitutional: positive for malaise and weakness Gastrointestinal: positive for diarrhea   ALLERGIES:  is allergic to ativan.  Patient last received chemotherapy/ treatment on n/a  Patient was last seen in the office on 05-16-2013  Next appt is 06-19-2013  Is patient having fevers greater than 100.5?  no   Is patient having uncontrolled pain, or new pain? no, "Her abdomen hurts at times and she has arthritis"   Is patient having new back pain that changes with position (worsens or eases when laying down?)  no   Is patient able to eat and drink? "her appetite is very little and she can't eat"    Is patient able to pass stool without difficulty?   "Continues diarrhea after hospital discharge"     Is patient having uncontrolled nausea?  no    Son [name] calls 06/08/2013 with complaint of  Constitutional: positive for malaise and Weakness Gastrointestinal: positive for diarrhea Patient's son [name] called reporting mom was discharged from the hospital a week ago.  [facility] home nurse feels his "mom needs to be seen asap and receive IVF.  The appointment on 06-19-2013 is too far out".  [name] reports she is very weak.  Decreased appetite and "can't eat much".  does not want to eat.   Still having diarrhea and takes 8 immodiuim pills each day.  I would have to go home to get her and could be tere within an hour.    Summary Based on the above information advised family to  Await return call after this nurse reviews with provider.   [name]  06/08/2013, 3:19 PM   Background Info  [name]   DOB: 04/25/1925   MR#: [id number]   CSN#   [id number]

## 2013-06-09 ENCOUNTER — Other Ambulatory Visit: Payer: Self-pay | Admitting: Internal Medicine

## 2013-06-09 ENCOUNTER — Telehealth: Payer: Self-pay | Admitting: Internal Medicine

## 2013-06-09 ENCOUNTER — Ambulatory Visit (HOSPITAL_BASED_OUTPATIENT_CLINIC_OR_DEPARTMENT_OTHER): Payer: Medicare Other

## 2013-06-09 VITALS — BP 103/49 | HR 97 | Temp 97.0°F

## 2013-06-09 DIAGNOSIS — E860 Dehydration: Secondary | ICD-10-CM

## 2013-06-09 DIAGNOSIS — C211 Malignant neoplasm of anal canal: Secondary | ICD-10-CM

## 2013-06-09 DIAGNOSIS — R197 Diarrhea, unspecified: Secondary | ICD-10-CM

## 2013-06-09 DIAGNOSIS — C210 Malignant neoplasm of anus, unspecified: Secondary | ICD-10-CM

## 2013-06-09 MED ORDER — ACETAMINOPHEN 325 MG PO TABS
325.0000 mg | ORAL_TABLET | Freq: Once | ORAL | Status: AC
  Administered 2013-06-09: 325 mg via ORAL

## 2013-06-09 MED ORDER — ACETAMINOPHEN 325 MG PO TABS
ORAL_TABLET | ORAL | Status: AC
  Filled 2013-06-09: qty 1

## 2013-06-09 MED ORDER — SODIUM CHLORIDE 0.9 % IV SOLN
INTRAVENOUS | Status: AC
  Administered 2013-06-09: 10:00:00 via INTRAVENOUS

## 2013-06-09 MED ORDER — SODIUM CHLORIDE 0.9 % IV SOLN: Freq: Once | INTRAVENOUS | Status: DC

## 2013-06-09 MED ORDER — ONDANSETRON HCL 8 MG PO TABS: 8.0000 mg | ORAL_TABLET | Freq: Three times a day (TID) | ORAL | Status: DC | PRN

## 2013-06-09 NOTE — Progress Notes (Signed)
In speaking with patient, she has baseline nausea most of the time. Has no antiemetic at home. Paged Dr. Juliann Mule to request Zofran. Per Dr. Juliann Mule, [place] to call in Zofran 8 mg every 8 hours prn. Patient reports headache rated 6/10-asking for Tylenol 325 mg po. OK per Dr. Juliann Mule.

## 2013-06-09 NOTE — Patient Instructions (Signed)
Dehydration, Adult °Dehydration is when you lose more fluids from the body than you take in. Vital organs like the kidneys, brain, and heart cannot function without a proper amount of fluids and salt. Any loss of fluids from the body can cause dehydration.  °CAUSES  °· Vomiting. °· Diarrhea. °· Excessive sweating. °· Excessive urine output. °· Fever. °SYMPTOMS  °Mild dehydration °· Thirst. °· Dry lips. °· Slightly dry mouth. °Moderate dehydration °· Very dry mouth. °· Sunken eyes. °· Skin does not bounce back quickly when lightly pinched and released. °· Dark urine and decreased urine production. °· Decreased tear production. °· Headache. °Severe dehydration °· Very dry mouth. °· Extreme thirst. °· Rapid, weak pulse (more than 100 beats per minute at rest). °· Cold hands and feet. °· Not able to sweat in spite of heat and temperature. °· Rapid breathing. °· Blue lips. °· Confusion and lethargy. °· Difficulty being awakened. °· Minimal urine production. °· No tears. °DIAGNOSIS  °Your caregiver will diagnose dehydration based on your symptoms and your exam. Blood and urine tests will help confirm the diagnosis. The diagnostic evaluation should also identify the cause of dehydration. °TREATMENT  °Treatment of mild or moderate dehydration can often be done at home by increasing the amount of fluids that you drink. It is best to drink small amounts of fluid more often. Drinking too much at one time can make vomiting worse. Refer to the home care instructions below. °Severe dehydration needs to be treated at the hospital where you will probably be given intravenous (IV) fluids that contain water and electrolytes. °HOME CARE INSTRUCTIONS  °· Ask your caregiver about specific rehydration instructions. °· Drink enough fluids to keep your urine clear or pale yellow. °· Drink small amounts frequently if you have nausea and vomiting. °· Eat as you normally do. °· Avoid: °· Foods or drinks high in sugar. °· Carbonated  drinks. °· Juice. °· Extremely hot or cold fluids. °· Drinks with caffeine. °· Fatty, greasy foods. °· Alcohol. °· Tobacco. °· Overeating. °· Gelatin desserts. °· Wash your hands well to avoid spreading bacteria and viruses. °· Only take over-the-counter or prescription medicines for pain, discomfort, or fever as directed by your caregiver. °· Ask your caregiver if you should continue all prescribed and over-the-counter medicines. °· Keep all follow-up appointments with your caregiver. °SEEK MEDICAL CARE IF: °· You have abdominal pain and it increases or stays in one area (localizes). °· You have a rash, stiff neck, or severe headache. °· You are irritable, sleepy, or difficult to awaken. °· You are weak, dizzy, or extremely thirsty. °SEEK IMMEDIATE MEDICAL CARE IF:  °· You are unable to keep fluids down or you get worse despite treatment. °· You have frequent episodes of vomiting or diarrhea. °· You have blood or green matter (bile) in your vomit. °· You have blood in your stool or your stool looks black and tarry. °· You have not urinated in 6 to 8 hours, or you have only urinated a small amount of very dark urine. °· You have a fever. °· You faint. °MAKE SURE YOU:  °· Understand these instructions. °· Will watch your condition. °· Will get help right away if you are not doing well or get worse. °Document Released: 01/25/2005 Document Revised: 04/19/2011 Document Reviewed: 09/14/2010 °ExitCare® Patient Information ©2014 [employer]. ° °Diarrhea °Diarrhea is frequent loose and watery bowel movements. It can cause you to feel weak and dehydrated. Dehydration can cause you to become tired and thirsty,   have a dry mouth, and have decreased urination that often is dark yellow. Diarrhea is a sign of another problem, most often an infection that will not last long. In most cases, diarrhea typically lasts 2 3 days. However, it can last longer if it is a sign of something more serious. It is important to treat your  diarrhea as directed by your caregive to lessen or prevent future episodes of diarrhea. °CAUSES  °Some common causes include: °· Gastrointestinal infections caused by viruses, bacteria, or parasites. °· Food poisoning or food allergies. °· Certain medicines, such as antibiotics, chemotherapy, and laxatives. °· Artificial sweeteners and fructose. °· Digestive disorders. °HOME CARE INSTRUCTIONS °· Ensure adequate fluid intake (hydration): have 1 cup (8 oz) of fluid for each diarrhea episode. Avoid fluids that contain simple sugars or sports drinks, fruit juices, whole milk products, and sodas. Your urine should be clear or pale yellow if you are drinking enough fluids. Hydrate with an oral rehydration solution that you can purchase at pharmacies, retail stores, and online. You can prepare an oral rehydration solution at home by mixing the following ingredients together: °·   tsp table salt. °· ¾ tsp baking soda. °·  tsp salt substitute containing potassium chloride. °· 1  tablespoons sugar. °· 1 L (34 oz) of water. °· Certain foods and beverages may increase the speed at which food moves through the gastrointestinal (GI) tract. These foods and beverages should be avoided and include: °· Caffeinated and alcoholic beverages. °· High-fiber foods, such as raw fruits and vegetables, nuts, seeds, and whole grain breads and cereals. °· Foods and beverages sweetened with sugar alcohols, such as xylitol, sorbitol, and mannitol. °· Some foods may be well tolerated and may help thicken stool including: °· Starchy foods, such as rice, toast, pasta, low-sugar cereal, oatmeal, grits, baked potatoes, crackers, and bagels. °· Bananas. °· Applesauce. °· Add probiotic-rich foods to help increase healthy bacteria in the GI tract, such as yogurt and fermented milk products. °· Wash your hands well after each diarrhea episode. °· Only take over-the-counter or prescription medicines as directed by your caregiver. °· Take a warm bath to  relieve any burning or pain from frequent diarrhea episodes. °SEEK IMMEDIATE MEDICAL CARE IF:  °· You are unable to keep fluids down. °· You have persistent vomiting. °· You have blood in your stool, or your stools are black and tarry. °· You do not urinate in 6 8 hours, or there is only a small amount of very dark urine. °· You have abdominal pain that increases or localizes. °· You have weakness, dizziness, confusion, or lightheadedness. °· You have a severe headache. °· Your diarrhea gets worse or does not get better. °· You have a fever or persistent symptoms for more than 2 3 days. °· You have a fever and your symptoms suddenly get worse. °MAKE SURE YOU:  °· Understand these instructions. °· Will watch your condition. °· Will get help right away if you are not doing well or get worse. °Document Released: 01/15/2002 Document Revised: 01/12/2012 Document Reviewed: 10/03/2011 °ExitCare® Patient Information ©2014 [employer]. ° °

## 2013-06-09 NOTE — Telephone Encounter (Signed)
Gave pt appt for lab, md and chemo for May Today @ chemo room

## 2013-06-11 ENCOUNTER — Telehealth: Payer: Self-pay | Admitting: Internal Medicine

## 2013-06-11 ENCOUNTER — Ambulatory Visit (HOSPITAL_BASED_OUTPATIENT_CLINIC_OR_DEPARTMENT_OTHER): Payer: Medicare Other | Admitting: Internal Medicine

## 2013-06-11 ENCOUNTER — Other Ambulatory Visit (HOSPITAL_BASED_OUTPATIENT_CLINIC_OR_DEPARTMENT_OTHER): Payer: Medicare Other

## 2013-06-11 VITALS — BP 118/72 | HR 84 | Temp 97.9°F | Resp 18 | Ht 65.0 in | Wt 90.2 lb

## 2013-06-11 DIAGNOSIS — E860 Dehydration: Secondary | ICD-10-CM

## 2013-06-11 DIAGNOSIS — K5289 Other specified noninfective gastroenteritis and colitis: Secondary | ICD-10-CM

## 2013-06-11 DIAGNOSIS — K5100 Ulcerative (chronic) pancolitis without complications: Secondary | ICD-10-CM

## 2013-06-11 DIAGNOSIS — I951 Orthostatic hypotension: Secondary | ICD-10-CM

## 2013-06-11 DIAGNOSIS — C210 Malignant neoplasm of anus, unspecified: Secondary | ICD-10-CM

## 2013-06-11 DIAGNOSIS — R197 Diarrhea, unspecified: Secondary | ICD-10-CM

## 2013-06-11 DIAGNOSIS — K529 Noninfective gastroenteritis and colitis, unspecified: Secondary | ICD-10-CM

## 2013-06-11 DIAGNOSIS — C211 Malignant neoplasm of anal canal: Secondary | ICD-10-CM

## 2013-06-11 LAB — CBC WITH DIFFERENTIAL/PLATELET
BASO%: 0.6 % (ref 0.0–2.0)
Basophils Absolute: 0.1 10*3/uL (ref 0.0–0.1)
EOS ABS: 0.1 10*3/uL (ref 0.0–0.5)
EOS%: 1.4 % (ref 0.0–7.0)
HCT: 31.1 % — ABNORMAL LOW (ref 34.8–46.6)
HGB: 10.2 g/dL — ABNORMAL LOW (ref 11.6–15.9)
LYMPH#: 0.7 10*3/uL — AB (ref 0.9–3.3)
LYMPH%: 7.3 % — ABNORMAL LOW (ref 14.0–49.7)
MCH: 32.2 pg (ref 25.1–34.0)
MCHC: 32.8 g/dL (ref 31.5–36.0)
MCV: 98.1 fL (ref 79.5–101.0)
MONO#: 0.9 10*3/uL (ref 0.1–0.9)
MONO%: 9 % (ref 0.0–14.0)
NEUT#: 8 10*3/uL — ABNORMAL HIGH (ref 1.5–6.5)
NEUT%: 81.7 % — ABNORMAL HIGH (ref 38.4–76.8)
PLATELETS: 264 10*3/uL (ref 145–400)
RBC: 3.17 10*6/uL — AB (ref 3.70–5.45)
RDW: 15.9 % — ABNORMAL HIGH (ref 11.2–14.5)
WBC: 9.7 10*3/uL (ref 3.9–10.3)

## 2013-06-11 LAB — TECHNOLOGIST REVIEW

## 2013-06-11 LAB — BASIC METABOLIC PANEL (CC13)
Anion Gap: 10 mEq/L (ref 3–11)
BUN: 9.1 mg/dL (ref 7.0–26.0)
CHLORIDE: 105 meq/L (ref 98–109)
CO2: 23 meq/L (ref 22–29)
Calcium: 9.1 mg/dL (ref 8.4–10.4)
Creatinine: 0.6 mg/dL (ref 0.6–1.1)
GLUCOSE: 107 mg/dL (ref 70–140)
POTASSIUM: 3.7 meq/L (ref 3.5–5.1)
SODIUM: 138 meq/L (ref 136–145)

## 2013-06-11 LAB — MAGNESIUM (CC13): Magnesium: 1.5 mg/dl (ref 1.5–2.5)

## 2013-06-11 NOTE — Telephone Encounter (Signed)
gve the pt her June 2015 appt calendar

## 2013-06-11 NOTE — Progress Notes (Signed)
[place] [address] [place] [address]  DIAGNOSIS: Colitis  Dehydration  Pancolitis  Orthostatic hypotension  Anal squamous cell carcinoma - Plan: Ambulatory referral to Nutrition and Diabetic Education, CBC with Differential, Comprehensive metabolic panel (C[facility]) - [place]  Chief Complaint  Patient presents with  . Follow-up    CURRENT THERAPY:  Observation.  INTERVAL HISTORY: [name] 80 y.o. female a history of anus squamous cell carcinoma presents for followup. She was last seen by me on 05/15/2013. She was admitted on 05/16/2013 due to progressive diarrhea and dehydration.  She was discharged on 05/30/2013.  She received intravenous fluid hydration on 06/09/2013.   She remains very frail. Prior to that, she  recently discharged on 05/03/2013 following a 4-day hospitalization with pancolitis (C. Difficile negative) presented with intractable diarrhea for last 6-7 weeks.  She finished flagyl and cipro.   Now, since her most recent hospitalization she takes both lomital and imodium for hier diarrhea.  She reports continued arthritis pain in her hands but otherwise feels  better than her prior visit.   She denies palpitations, muscle twitching, chest pain.  She also reports decreased appetite and generalized weakness.   MEDICAL HISTORY: Past Medical History  Diagnosis Date  . CAD (coronary artery disease)     2 vessel intervention 2003  . Dyslipidemia   . CHF (congestive heart failure)     single episode  . Mitral regurgitation     mild prolapse anterior & posterior leaflets  . Aortic valve sclerosis     mild moderate calcification, echo, 2009  . HTN (hypertension)     difficult to obtain BP at times.  [name] Kitchen Urethral trauma     bleeding in hospital  . Rheumatoid arthritis(714.0)     severe - deforming  . Lung disease, interstitial     related to rheumatoid arthritis, also question  of left apical  nodule...followed elsewhere..my understanding stabilized  . Carotid artery disease     doppler 12/25/2010,  0-39% bilateral  . Ejection fraction     EF 60%, echo, 11/2007  . Clot     ??? apical clot in the past ?? no longer an issue  . Drug therapy     Prednisone.  . Depression   . GERD (gastroesophageal reflux disease)   . Cataract   . Aortic stenosis     Moderately severe, echo, December, 2013  . Rectal bleed 04/06/2012  . Anemia   . Edema     May, 2014  . History of radiation therapy 05/11/12-05/19/12    pelvis/inguinal lymphnodes/only able to complete 6 fx  . S/P radiation therapy 12/11/2012-12/21/2012    Rectum and anus / 12.5 Gy at 2.5 Gy per fraction x 5 fractions delivered every other day  . Degeneration macular   . 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 CT scan of the chest, abdomen, and pelvis with IV contrast carried out on 04/13/2009 showed a right inguinal lymph node measuring 4.2 x 3.0 cm. There were right inguinal lymph nodes, the largest of which measured 2.2 x 2.1 cm. If we go back to the time that the CT scan on 03/16/2009, there was  concern that the patient had a malignant process, which has now been confirmed. It appears that this patient's cancer of the anal canal goes back at least to early 2011. CT scan of the chest, abdomen, and pelvis with IV contrast carried out on 04/13/2012 now shows pararenal mass lesion measuring 3.9 x 5.1 cm on image 109. The right pelvic sidewall lymph node mass now measures 4.9 x 3.2 cm on image 92 in the right inguinal lymph node measures 4.8 x 3.0 cm on image 103. These lymph nodes have increased compared with prior previous CT scans from 02/02/2012. The patient received radiation to the pelvis  and inguinal lymph nodes under the direction of Dr. Thea Silversmith. She received 15 Gy in 6 fractions over 9 days from 05/11/2012 through 05/19/2012. Radiation treatments needed to be stopped because of GI toxicity and a marked decline in the patient's performance status.  INTERIM HISTORY: has HYPERTENSION; Mitral regurgitation; Carotid artery disease; Drug therapy; Clot; Ejection fraction; Lung disease, interstitial; CHF (congestive heart failure); Dyslipidemia; CAD (coronary artery disease); Rheumatoid arthritis on chronic steroids; Hiatal hernia; Lymphadenopathy, abdominal; Cholelithiasis; Acute cholecystitis; Hypokalemia; Normocytic anemia; Bacteremia due to Escherichia coli; Aortic stenosis; Bright red blood per rectum; Anal squamous cell carcinoma; Cancer; Edema; Anemia, unspecified; Diarrhea; Dehydration; Protein-calorie malnutrition, severe; Pancolitis; Colitis; Nausea & vomiting; Sepsis; Acute diastolic heart failure; Orthostatic hypotension; and Diastolic dysfunction on her problem list.    ALLERGIES:  is allergic to ativan.  MEDICATIONS: has a current medication list which includes the following prescription(s): acetaminophen, amitriptyline, aspirin-acetaminophen-caffeine, diphenoxylate-atropine, ensure plus, esomeprazole, feeding supplement (resource breeze), hydroxypropyl methylcellulose, leflunomide, loperamide, loperamide, metoprolol succinate, multiple vitamins-minerals, ondansetron, potassium chloride sa, prednisone, and saccharomyces boulardii.  SURGICAL HISTORY:  Past Surgical History  Procedure Laterality Date  . Appendectomy    . Breast surgery    . Coronary angioplasty with stent placement  2003  . Colonoscopy N/A 04/08/2012    Procedure: COLONOSCOPY;  Surgeon: Winfield Cunas., MD;  Location[facility];  Service: Endoscopy;  Laterality: N/A;  . Lymph node biopsy  04/12/12    right inguinal lymph node-squamous cell carcinoma  . Tonsillectomy      REVIEW OF SYSTEMS:    Constitutional: Denies fevers, chills or abnormal weight loss Eyes: Denies blurriness of vision Ears, nose, mouth, throat, and face: Denies mucositis or sore throat Respiratory: Reports cough but denies dyspnea or wheezes Cardiovascular: Denies palpitation, chest discomfort or lower extremity swelling Gastrointestinal:  Denies nausea, heartburn or change in bowel habits Skin: Denies abnormal skin rashes Lymphatics: Denies new lymphadenopathy or easy bruising Neurological:Denies numbness, tingling or new weaknesses Behavioral/Psych: Mood is stable, no new changes  All other systems were reviewed with the patient and are negative.  PHYSICAL EXAMINATION: ECOG PERFORMANCE STATUS: 2 - Symptomatic, <50% confined to bed  Blood pressure 118/72, pulse 84, temperature 97.9 F (36.6 C), temperature source Oral, resp. rate 18, height 5\' 5"  (1.651 m), weight 90 lb 3.2 oz (40.914 kg).  GENERAL:alert, no distress and comfortable; sitting in wheelchair. Chronically ill appearing.  SKIN: skin color, texture, turgor are normal, no rashes or significant lesions EYES: normal, Conjunctiva are pink and non-injected, sclera clear OROPHARYNX:no exudate, no erythema and lips, buccal mucosa, and tongue normal  NECK: supple, thyroid normal size, non-tender, without nodularity LYMPH:  no palpable lymphadenopathy in the cervical, axillary or supraclavicular.  R groin adenopathy as noted on prior exams. LUNGS: clear to auscultation and percussion with normal breathing effort HEART: regular rate & rhythm and a systolic ejection murmur  grade 2/6 and no lower extremity edema ABDOMEN:abdomen soft, non-tender and normal bowel sounds Musculoskeletal:no cyanosis of digits and no clubbing; severe deforming rheumatoid arthritis in the hands bilaterally  NEURO: alert & oriented x 3 with fluent speech, no focal motor/sensory deficits   LABORATORY DATA: Results for orders placed in visit on 06/11/13 (from the past 48  hour(s))  CBC WITH DIFFERENTIAL     Status: Abnormal   Collection Time    06/11/13 11:11 AM      Result Value Ref Range   WBC 9.7  3.9 - 10.3 10e3/uL   NEUT# 8.0 (*) 1.5 - 6.5 10e3/uL   HGB 10.2 (*) 11.6 - 15.9 g/dL   HCT 31.1 (*) 34.8 - 46.6 %   Platelets 264  145 - 400 10e3/uL   MCV 98.1  79.5 - 101.0 fL   MCH 32.2  25.1 - 34.0 pg   MCHC 32.8  31.5 - 36.0 g/dL   RBC 3.17 (*) 3.70 - 5.45 10e6/uL   RDW 15.9 (*) 11.2 - 14.5 %   lymph# 0.7 (*) 0.9 - 3.3 10e3/uL   MONO# 0.9  0.1 - 0.9 10e3/uL   Eosinophils Absolute 0.1  0.0 - 0.5 10e3/uL   Basophils Absolute 0.1  0.0 - 0.1 10e3/uL   NEUT% 81.7 (*) 38.4 - 76.8 %   LYMPH% 7.3 (*) 14.0 - 49.7 %   MONO% 9.0  0.0 - 14.0 %   EOS% 1.4  0.0 - 7.0 %   BASO% 0.6  0.0 - 2.0 %  TECHNOLOGIST REVIEW     Status: None   Collection Time    06/11/13 11:11 AM      Result Value Ref Range   Technologist Review 3% myelocytes    BASIC METABOLIC PANEL (0000000)     Status: None   Collection Time    06/11/13 11:12 AM      Result Value Ref Range   Sodium 138  136 - 145 mEq/L   Potassium 3.7  3.5 - 5.1 mEq/L   Chloride 105  98 - 109 mEq/L   CO2 23  22 - 29 mEq/L   Glucose 107  70 - 140 mg/dl   BUN 9.1  7.0 - 26.0 mg/dL   Creatinine 0.6  0.6 - 1.1 mg/dL   Calcium 9.1  8.4 - 10.4 mg/dL   Anion Gap 10  3 - 11 mEq/L  MAGNESIUM (CC13)     Status: None   Collection Time    06/11/13 11:12 AM      Result Value Ref Range   Magnesium 1.5  1.5 - 2.5 mg/dl    RADIOGRAPHIC STUDIES: CT CHEST, ABDOMEN AND PELVIS WITH CONTRAST 04/13/2012 Technique: Multidetector CT imaging of the chest, abdomen and pelvis was performed following the standard protocol during bolus administration of intravenous contrast. Contrast: 23mL OMNIPAQUE IOHEXOL 300 MG/ML SOLN Comparison: CT chest, abdomen and pelvis 02/02/2012. CT CHEST  Findings: The thyroid gland is heterogeneous and mildly enlarged. No axillary, hilar or mediastinal lymphadenopathy is identified. The patient has a  massive hiatal hernia containing the entire stomach. Calcific coronary and aortic atherosclerosis is noted. Small calcified pleural plaques are noted bilaterally. There is some right basilar atelectasis. No nodule or mass is identified. No lytic or sclerotic bony lesion is identified. Thoracic spondylosis is noted. Severe degenerative change is present about the right shoulder. IMPRESSION: 1. Negative for metastatic or acute disease. 2. Very large hiatal hernia.  3. Coronary and aortic atherosclerosis. CT ABDOMEN AND PELVIS Findings:  Gallstones are seen without evidence of cholecystitis. The liver, biliary tree, adrenal glands, spleen, pancreas and right kidney appear normal. The patient has a perianal mass lesion measuring 3.9 x 5.1 cm on image 109. As seen on the prior study, there is a right inguinal lymph node mass measuring 4.8 x 3.0 cm on image 103, increased from 2.5 x 4. cm on the prior study. Right pelvic sidewall lymph node mass measures 4.9 x 3.2 cm on image 92 compared to 2.4 x 4.6 cm. No other lymphadenopathy is identified. There is no fluid  collection. No lytic or sclerotic lesion is seen. IMPRESSION: 1. Anal mass consistent with known carcinoma. Metastatic lymphadenopathy in the right groin and right pelvic sidewall is identified and has worsened since the prior study. 2. Gallstone without evidence of cholecystitis.  05/17/2013 CT abdomen pelvis (Referred by me personally) CT ABDOMEN AND PELVIS WITH CONTRAST TECHNIQUE: Multidetector CT imaging of the abdomen and pelvis was performed using the standard protocol following bolus administration of  intravenous contrast. CONTRAST: 36mL OMNIPAQUE IOHEXOL 300 MG/ML SOLN COMPARISON: CT ABD/PELVIS W CM dated 04/29/2013 FINDINGS: Lung bases show tiny bilateral effusions with compressive atelectasis in both lower lobes. Large hiatal hernia occupies the posterior aspect of the lower right hemi thorax, incompletely imaged. Heart is at the upper limits of  normal in size. Mitral  annulus and coronary artery calcification. No pericardial effusion. Liver measures 18.9 cm. Periportal edema and dilatation of the biliary tree, as before. Common bile duct measures up to 11 mm,  stable. A 2.0 cm rounded soft tissue lesion is seen in the gallbladder, as before. Adrenal glands are unremarkable. Right renal cortical scarring. A 7 mm low-attenuation lesion in the lower pole  left kidney is unchanged and likely a cyst. Spleen is unremarkable. The majority of the stomach is located within the the above described hiatal hernia. Duodenal diverticulum. Small bowel is otherwise unremarkable. There is diffuse wall thickening involving the colon presacral edema has worsened in the interval. Atherosclerotic calcification of the arterial vasculature without abdominal aortic aneurysm. Abdominal retroperitoneal lymph nodes measure up to 11 mm in the left periaortic station (image 30),  stable. Partially calcified right external iliac nodal mass measures 2.7 x 4.4 cm, stable. A heterogeneous mass is seen in the right inguinal region, measuring 3.2 x 4.6 cm, likely stable. Right inguinal hernia contains fat and nonobstructed small bowel. No worrisome lytic or sclerotic lesions. Degenerative changes are seen in the spine. IMPRESSION: 1. Diffuse colonic wall thickening, unchanged and indicative of pan colitis. Associated presacral edema has increased slightly. 2. Tiny bilateral effusions with compressive atelectasis in both lower lobes. 3. Periportal edema and biliary ductal dilatation are unchanged. 4. Heterogeneous and partially calcified nodal masses in the right external iliac chain and right inguinal region, stable. 5. Large hiatal hernia. 6. Hepatomegaly.   ASSESSMENT: 1. Squamous cell carcinoma of the anal canal, stage TII and 2, 3B, with biopsy of the primary tumor carried out on 04/09/2012 and core needle biopsies of a right inguinal lymph node on April 12 2012. Current  treatment held secondary to poor functional status, decrease by mouth intake. 2. Intractable diarrhea s/p cipro/flagyl. 3. FTT.  PLAN:  --Ms. Joneen Caraway is condition continues to be concerning for her diarrhea.  She reports persistence in her diarrhea despite completion of her cipro/flagyl with a pretty lengthy hospitalization.  She had a repeat CT of abdomen demonstrating pan colitis (as noted above).   We recommended to continue her lomital as scheduled and  to include imodium as tolerated.  If diarrhea persists, we will facilitate intravenous hydration prn.  We will make a referral to nutrition to determine if supplementation, i.e., boost is playing some role in her colitis/diarrhea.   -- However she remains very frail.   Given her performance status and her indolent disease, treatment decisions for her squamous cell carcinoma of the anal canal will be continuously weighed measuring the risks versus the benefits.   --All questions were answered. The patient knows to call the clinic with any problems, questions or concerns. We can certainly see the patient much sooner if necessary. Patient was provided after visit summary today's visit. Patient will return to clinic in 1 month with repeat complete blood count and chemistries.  I spent 15 minutes counseling the patient face to face. The total time spent in the appointment was 25 minutes.    Concha Norway, MD 06/11/2013 1:34 PM

## 2013-06-11 NOTE — Patient Instructions (Signed)
Loperamide tablets or capsules What is this medicine? LOPERAMIDE (loe PER a mide) is used to treat diarrhea. This medicine may be used for other purposes; ask your health care provider or pharmacist if you have questions. COMMON BRAND NAME(S): Anti-Diarrheal, Imodium A-D, K-Pek II What should I tell my health care provider before I take this medicine? They need to know if you have any of these conditions: -a black or bloody stool -bacterial food poisoning -colitis or mucus in your stool -currently taking an antibiotic medication for an infection -fever -liver disease -severe abdominal pain, swelling or bulging -an unusual or allergic reaction to loperamide, other medicines, foods, dyes, or preservatives -pregnant or trying to get pregnant -breast-feeding How should I use this medicine? Take this medicine by mouth with a glass of water. Follow the directions on the prescription label. Take your doses at regular intervals. Do not take your medicine more often than directed. Talk to your pediatrician regarding the use of this medicine in children. Special care may be needed. Overdosage: If you think you have taken too much of this medicine contact a poison control center or emergency room at once. NOTE: This medicine is only for you. Do not share this medicine with others. What if I miss a dose? This does not apply. This medicine is not for regular use. Only take this medicine while you continue to have loose bowel movements. Do not take more medicine than recommended by the packaging label or by your healthcare professional. What may interact with this medicine? Do not take this medicine with any of the following medications: -alosetron This medicine may also interact with the following medications: -quinidine -ritonavir -saquinavir This list may not describe all possible interactions. Give your health care provider a list of all the medicines, herbs, non-prescription drugs, or dietary  supplements you use. Also tell them if you smoke, drink alcohol, or use illegal drugs. Some items may interact with your medicine. What should I watch for while using this medicine? Do not take this medicine for more than 1 week without asking your doctor or health care professional. If your symptoms do not start to get better after two days, you may have a problem that needs further evaluation. Check with your doctor or health care professional right away if you develop a fever, severe abdominal pain, swelling or bulging, or if you have have bloody/black diarrhea or stools. You may get drowsy or dizzy. Do not drive, use machinery, or do anything that needs mental alertness until you know how this medicine affects you. Do not stand or sit up quickly, especially if you are an older patient. This reduces the risk of dizzy or fainting spells. Alcohol can increase possible drowsiness and dizziness. Avoid alcoholic drinks. Your mouth may get dry. Chewing sugarless gum or sucking hard candy, and drinking plenty of water may help. Contact your doctor if the problem does not go away or is severe. Drinking plenty of water can also help prevent dehydration that can occur with diarrhea. Elderly patients may have a more variable response to the effects of this medicine, and are more susceptible to the effects of dehydration. What side effects may I notice from receiving this medicine? Side effects that you should report to your doctor or health care professional as soon as possible: -allergic reactions like skin rash, itching or hives, swelling of the face, lips, or tongue -bloated, swollen feeling in your abdomen -blurred vision -loss of appetite -stomach pain Side effects that usually do not  require medical attention (report to your doctor or health care professional if they continue or are bothersome): -constipation -drowsiness or dizziness -dry mouth -nausea, vomiting This list may not describe all  possible side effects. Call your doctor for medical advice about side effects. You may report side effects to FDA at 1-800-FDA-1088. Where should I keep my medicine? Keep out of the reach of children. Store at room temperature between 15 and 25 degrees C (59 and 77 degrees F). Keep container tightly closed. Throw away any unused medicine after the expiration date. NOTE: This sheet is a summary. It may not cover all possible information. If you have questions about this medicine, talk to your doctor, pharmacist, or health care provider.  2014, Elsevier/Gold Standard. (2007-08-01 16:02:13) Atropine; Diphenoxylate oral liquid What is this medicine? ATROPINE; DIPHENOXYLATE (A troe peen dye fen OX i late) is used to treat diarrhea. This medicine may be used for other purposes; ask your health care provider or pharmacist if you have questions. COMMON BRAND NAME(S): Lomotil What should I tell my health care provider before I take this medicine? They need to know if you have any of these conditions: -bacterial food poisoning -colitis -dehydration -Down's syndrome -jaundice or liver disease -an unusual or allergic reaction to atropine, diphenoxylate, other medicines, foods, dyes, or preservatives -pregnant or trying to get pregnant -breast-feeding How should I use this medicine? Take this medicine by mouth. Follow the directions on the prescription label. Use a specially marked spoon or dropper to measure your medicine. Ask your pharmacist if you do not have one. Household spoons are not accurate. For children, only use a specially marked dropper to measure the liquid. Take your medicine at regular intervals. Do not take it more often than directed. Once your diarrhea has been brought under control your doctor or health care professional may reduce your doses. Talk to your pediatrician regarding the use of this medicine in children. While this drug may be prescribed for children as young as 12 years  old for selected conditions, precautions do apply. Overdosage: If you think you have taken too much of this medicine contact a poison control center or emergency room at once. NOTE: This medicine is only for you. Do not share this medicine with others. What if I miss a dose? If you miss a dose, take it as soon as you can. If it is almost time for your next dose, take only that dose. Do not take double or extra doses. What may interact with this medicine? -alcohol -antihistamines for allergy, cough and cold -barbiturate medicines for inducing sleep or treating seizures -certain medicines for depression, anxiety, or psychotic disturbances -certain medicines for sleep -medicines for movement abnormalities as in Parkinson's disease, or for gastrointestinal problems -muscle relaxants -narcotic medicines (opiates) for pain This list may not describe all possible interactions. Give your health care provider a list of all the medicines, herbs, non-prescription drugs, or dietary supplements you use. Also tell them if you smoke, drink alcohol, or use illegal drugs. Some items may interact with your medicine. What should I watch for while using this medicine? If your symptoms do not start to get better after taking this medicine for two days, check with your doctor or health care professional, you may have a problem that needs further evaluation. Check with your doctor or health care professional right away if you develop a fever or bloody diarrhea. You may get drowsy or dizzy. Do not drive, use machinery, or do anything that needs mental  alertness until you know how this medicine affects you. Do not stand or sit up quickly, especially if you are an older patient. This reduces the risk of dizzy or fainting spells. Alcohol may interfere with the effect of this medicine. Avoid alcoholic drinks. Your mouth may get dry. Chewing sugarless gum or sucking hard candy, and drinking plenty of water may help. Contact  your doctor if the problem does not go away or is severe. Drinking plenty of water can also help prevent dehydration that can occur with diarrhea. What side effects may I notice from receiving this medicine? Side effects that you should report to your doctor or health care professional as soon as possible: -allergic reactions like skin rash, itching or hives, swelling of the face, lips, or tongue -bloated, swollen feeling -breathing problems -changes in vision -fast, irregular heartbeat -stomach pain Side effects that usually do not require medical attention (report to your doctor or health care professional if they continue or are bothersome): -headache -loss of appetite -mood changes -nausea, vomiting -pain, tingling, numbness in the hands or feet This list may not describe all possible side effects. Call your doctor for medical advice about side effects. You may report side effects to FDA at 1-800-FDA-1088. Where should I keep my medicine? Keep out of the reach of children. This medicine can be abused. Keep your medicine in a safe place to protect it from theft. Do not share this medicine with anyone. Selling or giving away this medicine is dangerous and against the law. Store at room temperature between 15 and 30 degrees C (59 and 86 degrees F). Do not freeze. Protect from light. Keep container tightly closed.  Throw away any unused medicine after the expiration date. Discard unused medicine and used packaging carefully. Pets and children can be harmed if they find used or lost packages. NOTE: This sheet is a summary. It may not cover all possible information. If you have questions about this medicine, talk to your doctor, pharmacist, or health care provider.  2014, Elsevier/Gold Standard. (2010-10-06 15:25:21)

## 2013-06-14 ENCOUNTER — Ambulatory Visit: Payer: Medicare Other | Admitting: Podiatry

## 2013-06-19 ENCOUNTER — Other Ambulatory Visit: Payer: Medicare Other

## 2013-06-19 ENCOUNTER — Ambulatory Visit: Payer: Medicare Other

## 2013-06-26 ENCOUNTER — Ambulatory Visit: Payer: Medicare Other

## 2013-06-29 ENCOUNTER — Other Ambulatory Visit: Payer: Self-pay | Admitting: Medical Oncology

## 2013-06-29 DIAGNOSIS — C210 Malignant neoplasm of anus, unspecified: Secondary | ICD-10-CM

## 2013-06-29 MED ORDER — ONDANSETRON HCL 8 MG PO TABS: 8.0000 mg | ORAL_TABLET | Freq: Three times a day (TID) | ORAL | Status: DC | PRN

## 2013-06-29 MED ORDER — DIPHENOXYLATE-ATROPINE 2.5-0.025 MG PO TABS: 1.0000 | ORAL_TABLET | Freq: Four times a day (QID) | ORAL | Status: DC | PRN

## 2013-07-03 ENCOUNTER — Telehealth: Payer: Self-pay

## 2013-07-03 NOTE — Telephone Encounter (Signed)
[name] from [facility] called stating pt is now receptive to idea of hospice. New order faxed to hospice.

## 2013-07-17 ENCOUNTER — Ambulatory Visit: Payer: Medicare Other

## 2013-07-17 ENCOUNTER — Other Ambulatory Visit: Payer: Medicare Other

## 2013-07-20 ENCOUNTER — Other Ambulatory Visit: Payer: Self-pay | Admitting: Medical Oncology

## 2013-07-23 ENCOUNTER — Other Ambulatory Visit: Payer: Self-pay | Admitting: Medical Oncology

## 2013-07-23 ENCOUNTER — Telehealth: Payer: Self-pay | Admitting: Medical Oncology

## 2013-07-23 NOTE — Telephone Encounter (Signed)
[name] called stating that his mother has an appointment tomorrow. She has had a bad week-end and she is not able to make her appointments. He asked if we could reschedule 2-3 weeks out. I told her that would not be a problem. POF made and appointments cancelled for tomorrow.

## 2013-07-24 ENCOUNTER — Other Ambulatory Visit: Payer: Medicare Other

## 2013-07-24 ENCOUNTER — Ambulatory Visit: Payer: Medicare Other | Admitting: Podiatry

## 2013-07-24 ENCOUNTER — Ambulatory Visit: Payer: Medicare Other

## 2013-07-24 ENCOUNTER — Encounter: Payer: Medicare Other | Admitting: Nutrition

## 2013-07-25 ENCOUNTER — Ambulatory Visit: Payer: Medicare Other

## 2013-07-25 ENCOUNTER — Telehealth: Payer: Self-pay | Admitting: Medical Oncology

## 2013-07-25 ENCOUNTER — Other Ambulatory Visit: Payer: Self-pay | Admitting: Medical Oncology

## 2013-07-25 ENCOUNTER — Other Ambulatory Visit: Payer: Self-pay | Admitting: Internal Medicine

## 2013-07-25 ENCOUNTER — Telehealth: Payer: Self-pay | Admitting: *Deleted

## 2013-07-25 DIAGNOSIS — E860 Dehydration: Secondary | ICD-10-CM

## 2013-07-25 MED ORDER — SODIUM CHLORIDE 0.9 % IV SOLN: 1000.0000 mL | Freq: Once | INTRAVENOUS | Status: AC

## 2013-07-25 MED ORDER — SODIUM CHLORIDE 0.9 % IV SOLN: Freq: Once | INTRAVENOUS | Status: DC

## 2013-07-25 NOTE — Telephone Encounter (Signed)
misake open chart

## 2013-07-25 NOTE — Telephone Encounter (Signed)
[name] called stating his mother was not able to come yesterday to see Dr. Juliann Mule because she was not feeling well. He feels she could use some IV fluids. I asked if she is under hospice care and he states yes. When I suggested they do the fluid he informed me that  They did it once and it took 19 hours and his mother was not happy with that. I spoke with Dr. Juliann Mule is and he is willing to order fluids. I gave him an appointment for 9 am 07/26/16. I told him Dr. Juliann Mule will problaby come by and see them in the infusion room. He voiced understanding.

## 2013-07-26 ENCOUNTER — Encounter: Payer: Self-pay | Admitting: Internal Medicine

## 2013-07-26 ENCOUNTER — Encounter (INDEPENDENT_AMBULATORY_CARE_PROVIDER_SITE_OTHER): Payer: Self-pay

## 2013-07-26 ENCOUNTER — Ambulatory Visit (HOSPITAL_BASED_OUTPATIENT_CLINIC_OR_DEPARTMENT_OTHER)

## 2013-07-26 VITALS — BP 119/77 | HR 92 | Temp 98.2°F

## 2013-07-26 DIAGNOSIS — C801 Malignant (primary) neoplasm, unspecified: Secondary | ICD-10-CM

## 2013-07-26 DIAGNOSIS — C211 Malignant neoplasm of anal canal: Secondary | ICD-10-CM

## 2013-07-26 DIAGNOSIS — C210 Malignant neoplasm of anus, unspecified: Secondary | ICD-10-CM

## 2013-07-26 DIAGNOSIS — E860 Dehydration: Secondary | ICD-10-CM

## 2013-07-26 MED ORDER — SODIUM CHLORIDE 0.9 % IV SOLN
Freq: Once | INTRAVENOUS | Status: AC
  Administered 2013-07-26: 10:00:00 via INTRAVENOUS

## 2013-07-26 NOTE — Progress Notes (Signed)
Family member informed me that pt is now under [facility] but didn't have an ID card.  I spoke to Weston with Hospice to verify.  Pt is now under Hospice and he gave me pt's ID number.  I will enter it under her coverage.

## 2013-07-26 NOTE — Patient Instructions (Addendum)
Dehydration Dehydration means your body does not have as much fluid as it needs. Your kidneys, brain, and heart will not work properly without the right amount of fluids and salt. Older adults are more likely to become dehydrated than younger adults. This is because:   Their bodies do not hold water as well.  Their bodies do not respond to temperature changes as well.  They do not get thirsty as easily or as quickly. HOME CARE  Ask your doctor how to replace body fluid losses (rehydrate).  Drink enough fluids to keep your pee (urine) clear or pale yellow.  Drink small amounts of fluids often if you feel sick to your stomach (nauseous) or throw up (vomit).  Eat like you normally do.  Avoid:  Foods or drinks high in sugar.  Bubbly (carbonated) drinks.  Juice.  Very hot or cold fluids.  Drinks with caffeine.  Fatty, greasy foods.  Alcohol.  Tobacco.  Eating too much.  Gelatin desserts.  Wash your hands to avoid spreading germs (bacteria, viruses).  Only take medicine as told by your doctor.  Keep all doctor visits as told. GET HELP IF:  You have belly (abdominal) pain that gets worse or stays in one spot (localizes).  You have a rash, stiff neck, or bad headache.  You get easily annoyed, sleepy, or are hard to wake up.  You feel weak, dizzy, or very thirsty.  You have a fever. GET HELP RIGHT AWAY IF:   You cannot drink fluid without throwing up.  You get worse even with treatment.  You throw up often.  You have watery poop (diarrhea) often.  Your vomit has blood in it or looks greenish.  Your poop (stool) has blood in it or looks black and tarry.  You have not peed in 6 to 8 hours or have only peed a small amount of very dark pee.  You pass out (faint). MAKE SURE YOU:   Understand these instructions.  Will watch your condition.  Will get help right away if you are not doing well or get worse. Document Released: 01/14/2011 Document Revised:  01/30/2013 Document Reviewed: 10/02/2012 [facility] Patient Information 2015 [place], [place]. This information is not intended to replace advice given to you by your health care provider. Make sure you discuss any questions you have with your health care provider. Dehydration, Adult Dehydration is when you lose more fluids from the body than you take in. Vital organs like the kidneys, brain, and heart cannot function without a proper amount of fluids and salt. Any loss of fluids from the body can cause dehydration.  CAUSES   Vomiting.  Diarrhea.  Excessive sweating.  Excessive urine output.  Fever. SYMPTOMS  Mild dehydration  Thirst.  Dry lips.  Slightly dry mouth. Moderate dehydration  Very dry mouth.  Sunken eyes.  Skin does not bounce back quickly when lightly pinched and released.  Dark urine and decreased urine production.  Decreased tear production.  Headache. Severe dehydration  Very dry mouth.  Extreme thirst.  Rapid, weak pulse (more than 100 beats per minute at rest).  Cold hands and feet.  Not able to sweat in spite of heat and temperature.  Rapid breathing.  Blue lips.  Confusion and lethargy.  Difficulty being awakened.  Minimal urine production.  No tears. DIAGNOSIS  Your caregiver will diagnose dehydration based on your symptoms and your exam. Blood and urine tests will help confirm the diagnosis. The diagnostic evaluation should also identify the cause of dehydration.  TREATMENT  Treatment of mild or moderate dehydration can often be done at home by increasing the amount of fluids that you drink. It is best to drink small amounts of fluid more often. Drinking too much at one time can make vomiting worse. Refer to the home care instructions below. Severe dehydration needs to be treated at the hospital where you will probably be given intravenous (IV) fluids that contain water and electrolytes. HOME CARE INSTRUCTIONS   Ask your caregiver  about specific rehydration instructions.  Drink enough fluids to keep your urine clear or pale yellow.  Drink small amounts frequently if you have nausea and vomiting.  Eat as you normally do.  Avoid:  Foods or drinks high in sugar.  Carbonated drinks.  Juice.  Extremely hot or cold fluids.  Drinks with caffeine.  Fatty, greasy foods.  Alcohol.  Tobacco.  Overeating.  Gelatin desserts.  Wash your hands well to avoid spreading bacteria and viruses.  Only take over-the-counter or prescription medicines for pain, discomfort, or fever as directed by your caregiver.  Ask your caregiver if you should continue all prescribed and over-the-counter medicines.  Keep all follow-up appointments with your caregiver. SEEK MEDICAL CARE IF:  You have abdominal pain and it increases or stays in one area (localizes).  You have a rash, stiff neck, or severe headache.  You are irritable, sleepy, or difficult to awaken.  You are weak, dizzy, or extremely thirsty. SEEK IMMEDIATE MEDICAL CARE IF:   You are unable to keep fluids down or you get worse despite treatment.  You have frequent episodes of vomiting or diarrhea.  You have blood or green matter (bile) in your vomit.  You have blood in your stool or your stool looks black and tarry.  You have not urinated in 6 to 8 hours, or you have only urinated a small amount of very dark urine.  You have a fever.  You faint. MAKE SURE YOU:   Understand these instructions.  Will watch your condition.  Will get help right away if you are not doing well or get worse. Document Released: 01/25/2005 Document Revised: 04/19/2011 Document Reviewed: 09/14/2010 [facility] Patient Information 2015 [place], [place]. This information is not intended to replace advice given to you by your health care provider. Make sure you discuss any questions you have with your health care provider.

## 2013-08-01 ENCOUNTER — Other Ambulatory Visit: Payer: Self-pay | Admitting: Medical Oncology

## 2013-08-01 ENCOUNTER — Telehealth: Payer: Self-pay | Admitting: Medical Oncology

## 2013-08-01 ENCOUNTER — Telehealth: Payer: Self-pay | Admitting: Internal Medicine

## 2013-08-01 NOTE — Telephone Encounter (Signed)
, °

## 2013-08-01 NOTE — Telephone Encounter (Signed)
[facility] called stating [name] is continues to have a lot of diarrhea. She is taking imodium and lomotil. Her BP today was hard to hear and was palpated at 98/44. She is weak and not eating or drinking well. She also has 1+ edema and has been taking her lasix. [name]' son had to cancel her last appointment due to weakness but she did come in for fluids. Clarene Critchley is not sure the fluids helped the [name]. I told her Dr. Juliann Mule can see [name] Friday morning with labs if her son can bring her. He would like to bring her in. Dr. Juliann Mule aware.

## 2013-08-02 ENCOUNTER — Ambulatory Visit: Payer: Medicare Other | Admitting: Radiation Oncology

## 2013-08-03 ENCOUNTER — Ambulatory Visit (HOSPITAL_BASED_OUTPATIENT_CLINIC_OR_DEPARTMENT_OTHER)

## 2013-08-03 ENCOUNTER — Ambulatory Visit (HOSPITAL_BASED_OUTPATIENT_CLINIC_OR_DEPARTMENT_OTHER): Payer: Medicare Other | Admitting: Internal Medicine

## 2013-08-03 ENCOUNTER — Other Ambulatory Visit (HOSPITAL_BASED_OUTPATIENT_CLINIC_OR_DEPARTMENT_OTHER)

## 2013-08-03 ENCOUNTER — Telehealth: Payer: Self-pay | Admitting: Internal Medicine

## 2013-08-03 VITALS — BP 122/69 | HR 84 | Temp 96.9°F | Resp 18 | Ht 65.0 in | Wt 91.2 lb

## 2013-08-03 DIAGNOSIS — E860 Dehydration: Secondary | ICD-10-CM

## 2013-08-03 DIAGNOSIS — C210 Malignant neoplasm of anus, unspecified: Secondary | ICD-10-CM

## 2013-08-03 DIAGNOSIS — R197 Diarrhea, unspecified: Secondary | ICD-10-CM

## 2013-08-03 DIAGNOSIS — E876 Hypokalemia: Secondary | ICD-10-CM

## 2013-08-03 DIAGNOSIS — K5289 Other specified noninfective gastroenteritis and colitis: Secondary | ICD-10-CM

## 2013-08-03 DIAGNOSIS — C211 Malignant neoplasm of anal canal: Secondary | ICD-10-CM

## 2013-08-03 LAB — CBC WITH DIFFERENTIAL/PLATELET
BASO%: 0.7 % (ref 0.0–2.0)
BASOS ABS: 0.1 10*3/uL (ref 0.0–0.1)
EOS ABS: 0.2 10*3/uL (ref 0.0–0.5)
EOS%: 2.4 % (ref 0.0–7.0)
HCT: 33.3 % — ABNORMAL LOW (ref 34.8–46.6)
HEMOGLOBIN: 10.9 g/dL — AB (ref 11.6–15.9)
LYMPH#: 1 10*3/uL (ref 0.9–3.3)
LYMPH%: 13.5 % — ABNORMAL LOW (ref 14.0–49.7)
MCH: 31.5 pg (ref 25.1–34.0)
MCHC: 32.6 g/dL (ref 31.5–36.0)
MCV: 96.8 fL (ref 79.5–101.0)
MONO#: 1.1 10*3/uL — AB (ref 0.1–0.9)
MONO%: 14.7 % — ABNORMAL HIGH (ref 0.0–14.0)
NEUT%: 68.7 % (ref 38.4–76.8)
NEUTROS ABS: 5 10*3/uL (ref 1.5–6.5)
PLATELETS: 295 10*3/uL (ref 145–400)
RBC: 3.45 10*6/uL — ABNORMAL LOW (ref 3.70–5.45)
RDW: 14.6 % — ABNORMAL HIGH (ref 11.2–14.5)
WBC: 7.3 10*3/uL (ref 3.9–10.3)

## 2013-08-03 LAB — COMPREHENSIVE METABOLIC PANEL (CC13)
ALT: 6 U/L (ref 0–55)
ANION GAP: 9 meq/L (ref 3–11)
AST: 17 U/L (ref 5–34)
Albumin: 2.8 g/dL — ABNORMAL LOW (ref 3.5–5.0)
Alkaline Phosphatase: 159 U/L — ABNORMAL HIGH (ref 40–150)
BUN: 7.9 mg/dL (ref 7.0–26.0)
CHLORIDE: 103 meq/L (ref 98–109)
CO2: 27 meq/L (ref 22–29)
CREATININE: 0.6 mg/dL (ref 0.6–1.1)
Calcium: 8.7 mg/dL (ref 8.4–10.4)
GLUCOSE: 101 mg/dL (ref 70–140)
Potassium: 3.2 mEq/L — ABNORMAL LOW (ref 3.5–5.1)
Sodium: 139 mEq/L (ref 136–145)
Total Bilirubin: 0.34 mg/dL (ref 0.20–1.20)
Total Protein: 5.8 g/dL — ABNORMAL LOW (ref 6.4–8.3)

## 2013-08-03 LAB — MAGNESIUM (CC13): MAGNESIUM: 1.8 mg/dL (ref 1.5–2.5)

## 2013-08-03 MED ORDER — DIPHENOXYLATE-ATROPINE 2.5-0.025 MG PO TABS: 1.0000 | ORAL_TABLET | Freq: Four times a day (QID) | ORAL | Status: DC | PRN

## 2013-08-03 MED ORDER — SODIUM CHLORIDE 0.9 % IV SOLN
Freq: Once | INTRAVENOUS | Status: AC
  Administered 2013-08-03: 11:00:00 via INTRAVENOUS

## 2013-08-03 NOTE — Telephone Encounter (Signed)
gv and printed avs..[name] KitchenPer Mrs. Myrtle she will add on saline

## 2013-08-03 NOTE — Patient Instructions (Signed)
Dehydration, Adult Dehydration is when you lose more fluids from the body than you take in. Vital organs like the kidneys, brain, and heart cannot function without a proper amount of fluids and salt. Any loss of fluids from the body can cause dehydration.  CAUSES   Vomiting.  Diarrhea.  Excessive sweating.  Excessive urine output.  Fever. SYMPTOMS  Mild dehydration  Thirst.  Dry lips.  Slightly dry mouth. Moderate dehydration  Very dry mouth.  Sunken eyes.  Skin does not bounce back quickly when lightly pinched and released.  Dark urine and decreased urine production.  Decreased tear production.  Headache. Severe dehydration  Very dry mouth.  Extreme thirst.  Rapid, weak pulse (more than 100 beats per minute at rest).  Cold hands and feet.  Not able to sweat in spite of heat and temperature.  Rapid breathing.  Blue lips.  Confusion and lethargy.  Difficulty being awakened.  Minimal urine production.  No tears. DIAGNOSIS  Your caregiver will diagnose dehydration based on your symptoms and your exam. Blood and urine tests will help confirm the diagnosis. The diagnostic evaluation should also identify the cause of dehydration. TREATMENT  Treatment of mild or moderate dehydration can often be done at home by increasing the amount of fluids that you drink. It is best to drink small amounts of fluid more often. Drinking too much at one time can make vomiting worse. Refer to the home care instructions below. Severe dehydration needs to be treated at the hospital where you will probably be given intravenous (IV) fluids that contain water and electrolytes. HOME CARE INSTRUCTIONS   Ask your caregiver about specific rehydration instructions.  Drink enough fluids to keep your urine clear or pale yellow.  Drink small amounts frequently if you have nausea and vomiting.  Eat as you normally do.  Avoid:  Foods or drinks high in sugar.  Carbonated  drinks.  Juice.  Extremely hot or cold fluids.  Drinks with caffeine.  Fatty, greasy foods.  Alcohol.  Tobacco.  Overeating.  Gelatin desserts.  Wash your hands well to avoid spreading bacteria and viruses.  Only take over-the-counter or prescription medicines for pain, discomfort, or fever as directed by your caregiver.  Ask your caregiver if you should continue all prescribed and over-the-counter medicines.  Keep all follow-up appointments with your caregiver. SEEK MEDICAL CARE IF:  You have abdominal pain and it increases or stays in one area (localizes).  You have a rash, stiff neck, or severe headache.  You are irritable, sleepy, or difficult to awaken.  You are weak, dizzy, or extremely thirsty. SEEK IMMEDIATE MEDICAL CARE IF:   You are unable to keep fluids down or you get worse despite treatment.  You have frequent episodes of vomiting or diarrhea.  You have blood or green matter (bile) in your vomit.  You have blood in your stool or your stool looks black and tarry.  You have not urinated in 6 to 8 hours, or you have only urinated a small amount of very dark urine.  You have a fever.  You faint. MAKE SURE YOU:   Understand these instructions.  Will watch your condition.  Will get help right away if you are not doing well or get worse. Document Released: 01/25/2005 Document Revised: 04/19/2011 Document Reviewed: 09/14/2010 [employer] Patient Information 2015 [employer]. This information is not intended to replace advice given to you by your health care provider. Make sure you discuss any questions you have with your health care   provider.  

## 2013-08-03 NOTE — Patient Instructions (Signed)
Diarrhea Diarrhea is frequent loose and watery bowel movements. It can cause you to feel weak and dehydrated. Dehydration can cause you to become tired and thirsty, have a dry mouth, and have decreased urination that often is dark yellow. Diarrhea is a sign of another problem, most often an infection that will not last long. In most cases, diarrhea typically lasts 2-3 days. However, it can last longer if it is a sign of something more serious. It is important to treat your diarrhea as directed by your caregive to lessen or prevent future episodes of diarrhea. CAUSES  Some common causes include:  Gastrointestinal infections caused by viruses, bacteria, or parasites.  Food poisoning or food allergies.  Certain medicines, such as antibiotics, chemotherapy, and laxatives.  Artificial sweeteners and fructose.  Digestive disorders. HOME CARE INSTRUCTIONS  Ensure adequate fluid intake (hydration): have 1 cup (8 oz) of fluid for each diarrhea episode. Avoid fluids that contain simple sugars or sports drinks, fruit juices, whole milk products, and sodas. Your urine should be clear or pale yellow if you are drinking enough fluids. Hydrate with an oral rehydration solution that you can purchase at pharmacies, retail stores, and online. You can prepare an oral rehydration solution at home by mixing the following ingredients together:   - tsp table salt.   tsp baking soda.   tsp salt substitute containing potassium chloride.  1  tablespoons sugar.  1 L (34 oz) of water.  Certain foods and beverages may increase the speed at which food moves through the gastrointestinal (GI) tract. These foods and beverages should be avoided and include:  Caffeinated and alcoholic beverages.  High-fiber foods, such as raw fruits and vegetables, nuts, seeds, and whole grain breads and cereals.  Foods and beverages sweetened with sugar alcohols, such as xylitol, sorbitol, and mannitol.  Some foods may be well  tolerated and may help thicken stool including:  Starchy foods, such as rice, toast, pasta, low-sugar cereal, oatmeal, grits, baked potatoes, crackers, and bagels.  Bananas.  Applesauce.  Add probiotic-rich foods to help increase healthy bacteria in the GI tract, such as yogurt and fermented milk products.  Wash your hands well after each diarrhea episode.  Only take over-the-counter or prescription medicines as directed by your caregiver.  Take a warm bath to relieve any burning or pain from frequent diarrhea episodes. SEEK IMMEDIATE MEDICAL CARE IF:   You are unable to keep fluids down.  You have persistent vomiting.  You have blood in your stool, or your stools are black and tarry.  You do not urinate in 6-8 hours, or there is only a small amount of very dark urine.  You have abdominal pain that increases or localizes.  You have weakness, dizziness, confusion, or lightheadedness.  You have a severe headache.  Your diarrhea gets worse or does not get better.  You have a fever or persistent symptoms for more than 2-3 days.  You have a fever and your symptoms suddenly get worse. MAKE SURE YOU:   Understand these instructions.  Will watch your condition.  Will get help right away if you are not doing well or get worse. Document Released: 01/15/2002 Document Revised: 01/12/2012 Document Reviewed: 10/03/2011 [facility] Patient Information 2015 [place], [place]. This information is not intended to replace advice given to you by your health care provider. Make sure you discuss any questions you have with your health care provider. Dehydration, Adult Dehydration is when you lose more fluids from the body than you take in. Vital  organs like the kidneys, brain, and heart cannot function without a proper amount of fluids and salt. Any loss of fluids from the body can cause dehydration.  CAUSES   Vomiting.  Diarrhea.  Excessive sweating.  Excessive urine  output.  Fever. SYMPTOMS  Mild dehydration  Thirst.  Dry lips.  Slightly dry mouth. Moderate dehydration  Very dry mouth.  Sunken eyes.  Skin does not bounce back quickly when lightly pinched and released.  Dark urine and decreased urine production.  Decreased tear production.  Headache. Severe dehydration  Very dry mouth.  Extreme thirst.  Rapid, weak pulse (more than 100 beats per minute at rest).  Cold hands and feet.  Not able to sweat in spite of heat and temperature.  Rapid breathing.  Blue lips.  Confusion and lethargy.  Difficulty being awakened.  Minimal urine production.  No tears. DIAGNOSIS  Your caregiver will diagnose dehydration based on your symptoms and your exam. Blood and urine tests will help confirm the diagnosis. The diagnostic evaluation should also identify the cause of dehydration. TREATMENT  Treatment of mild or moderate dehydration can often be done at home by increasing the amount of fluids that you drink. It is best to drink small amounts of fluid more often. Drinking too much at one time can make vomiting worse. Refer to the home care instructions below. Severe dehydration needs to be treated at the hospital where you will probably be given intravenous (IV) fluids that contain water and electrolytes. HOME CARE INSTRUCTIONS   Ask your caregiver about specific rehydration instructions.  Drink enough fluids to keep your urine clear or pale yellow.  Drink small amounts frequently if you have nausea and vomiting.  Eat as you normally do.  Avoid:  Foods or drinks high in sugar.  Carbonated drinks.  Juice.  Extremely hot or cold fluids.  Drinks with caffeine.  Fatty, greasy foods.  Alcohol.  Tobacco.  Overeating.  Gelatin desserts.  Wash your hands well to avoid spreading bacteria and viruses.  Only take over-the-counter or prescription medicines for pain, discomfort, or fever as directed by your  caregiver.  Ask your caregiver if you should continue all prescribed and over-the-counter medicines.  Keep all follow-up appointments with your caregiver. SEEK MEDICAL CARE IF:  You have abdominal pain and it increases or stays in one area (localizes).  You have a rash, stiff neck, or severe headache.  You are irritable, sleepy, or difficult to awaken.  You are weak, dizzy, or extremely thirsty. SEEK IMMEDIATE MEDICAL CARE IF:   You are unable to keep fluids down or you get worse despite treatment.  You have frequent episodes of vomiting or diarrhea.  You have blood or green matter (bile) in your vomit.  You have blood in your stool or your stool looks black and tarry.  You have not urinated in 6 to 8 hours, or you have only urinated a small amount of very dark urine.  You have a fever.  You faint. MAKE SURE YOU:   Understand these instructions.  Will watch your condition.  Will get help right away if you are not doing well or get worse. Document Released: 01/25/2005 Document Revised: 04/19/2011 Document Reviewed: 09/14/2010 [facility] Patient Information 2015 [place], [place]. This information is not intended to replace advice given to you by your health care provider. Make sure you discuss any questions you have with your health care provider. Loperamide tablets or capsules What is this medicine? LOPERAMIDE (loe PER a mide) is used  to treat diarrhea. This medicine may be used for other purposes; ask your health care provider or pharmacist if you have questions. COMMON BRAND NAME(S): Anti-Diarrheal, Imodium A-D, K-Pek II What should I tell my health care provider before I take this medicine? They need to know if you have any of these conditions: -a black or bloody stool -bacterial food poisoning -colitis or mucus in your stool -currently taking an antibiotic medication for an infection -fever -liver disease -severe abdominal pain, swelling or bulging -an unusual or  allergic reaction to loperamide, other medicines, foods, dyes, or preservatives -pregnant or trying to get pregnant -breast-feeding How should I use this medicine? Take this medicine by mouth with a glass of water. Follow the directions on the prescription label. Take your doses at regular intervals. Do not take your medicine more often than directed. Talk to your pediatrician regarding the use of this medicine in children. Special care may be needed. Overdosage: If you think you have taken too much of this medicine contact a poison control center or emergency room at once. NOTE: This medicine is only for you. Do not share this medicine with others. What if I miss a dose? This does not apply. This medicine is not for regular use. Only take this medicine while you continue to have loose bowel movements. Do not take more medicine than recommended by the packaging label or by your healthcare professional. What may interact with this medicine? Do not take this medicine with any of the following medications: -alosetron This medicine may also interact with the following medications: -quinidine -ritonavir -saquinavir This list may not describe all possible interactions. Give your health care provider a list of all the medicines, herbs, non-prescription drugs, or dietary supplements you use. Also tell them if you smoke, drink alcohol, or use illegal drugs. Some items may interact with your medicine. What should I watch for while using this medicine? Do not take this medicine for more than 1 week without asking your doctor or health care professional. If your symptoms do not start to get better after two days, you may have a problem that needs further evaluation. Check with your doctor or health care professional right away if you develop a fever, severe abdominal pain, swelling or bulging, or if you have have bloody/black diarrhea or stools. You may get drowsy or dizzy. Do not drive, use machinery, or do  anything that needs mental alertness until you know how this medicine affects you. Do not stand or sit up quickly, especially if you are an older patient. This reduces the risk of dizzy or fainting spells. Alcohol can increase possible drowsiness and dizziness. Avoid alcoholic drinks. Your mouth may get dry. Chewing sugarless gum or sucking hard candy, and drinking plenty of water may help. Contact your doctor if the problem does not go away or is severe. Drinking plenty of water can also help prevent dehydration that can occur with diarrhea. Elderly patients may have a more variable response to the effects of this medicine, and are more susceptible to the effects of dehydration. What side effects may I notice from receiving this medicine? Side effects that you should report to your doctor or health care professional as soon as possible: -allergic reactions like skin rash, itching or hives, swelling of the face, lips, or tongue -bloated, swollen feeling in your abdomen -blurred vision -loss of appetite -stomach pain Side effects that usually do not require medical attention (report to your doctor or health care professional if they continue  or are bothersome): -constipation -drowsiness or dizziness -dry mouth -nausea, vomiting This list may not describe all possible side effects. Call your doctor for medical advice about side effects. You may report side effects to FDA at 1-800-FDA-1088. Where should I keep my medicine? Keep out of the reach of children. Store at room temperature between 15 and 25 degrees C (59 and 77 degrees F). Keep container tightly closed. Throw away any unused medicine after the expiration date. NOTE: This sheet is a summary. It may not cover all possible information. If you have questions about this medicine, talk to your doctor, pharmacist, or health care provider.  2015, [employer]. (2007-08-01 16:02:13)

## 2013-08-04 NOTE — Progress Notes (Signed)
[place] [address] [address] [place] [address]  DIAGNOSIS: Dehydration - Plan: 0.9 %  sodium chloride infusion  Malignant neoplasm of anus, unspecified site - Plan: diphenoxylate-atropine (LOMOTIL) 2.5-0.025 MG per tablet, DISCONTINUED: diphenoxylate-atropine (LOMOTIL) 2.5-0.025 MG per tablet  Chief Complaint  Patient presents with  . Follow-up    CURRENT THERAPY:  Observation.  INTERVAL HISTORY: [name] 80 y.o. female a history of anus squamous cell carcinoma presents for followup. She was last seen by me on 06/11/2013. She was admitted on 05/30/2013 and 05/16/2013 due to progressive diarrhea and dehydration.  She was discharged on 05/31/2013 to home hospice.  She received intravenous fluid hydration on 06/09/2013.   She remains very frail. Prior to that, she  recently discharged on 05/03/2013 following a 4-day hospitalization with pancolitis (C. Difficile negative) presented with intractable diarrhea for last 6-7 weeks.  She finished flagyl and cipro.   Now, since her most recent hospitalization she takes both lomital and imodium for hier diarrhea.  She reports continued arthritis pain in her hands but otherwise feels  better than her prior visit.   She denies palpitations, muscle twitching, chest pain.  She also reports decreased appetite and generalized weakness.   MEDICAL HISTORY: Past Medical History  Diagnosis Date  . CAD (coronary artery disease)     2 vessel intervention 2003  . Dyslipidemia   . CHF (congestive heart failure)     single episode  . Mitral regurgitation     mild prolapse anterior & posterior leaflets  . Aortic valve sclerosis     mild moderate calcification, echo, 2009  . HTN (hypertension)     difficult to obtain BP at times.  [name] Kitchen Urethral trauma     bleeding in hospital  . Rheumatoid arthritis(714.0)     severe - deforming  . Lung disease, interstitial     related to rheumatoid arthritis,  also question  of left apical nodule...followed elsewhere..my understanding stabilized  . Carotid artery disease     doppler 12/25/2010,  0-39% bilateral  . Ejection fraction     EF 60%, echo, 11/2007  . Clot     ??? apical clot in the past ?? no longer an issue  . Drug therapy     Prednisone.  . Depression   . GERD (gastroesophageal reflux disease)   . Cataract   . Aortic stenosis     Moderately severe, echo, December, 2013  . Rectal bleed 04/06/2012  . Anemia   . Edema     May, 2014  . History of radiation therapy 05/11/12-05/19/12    pelvis/inguinal lymphnodes/only able to complete 6 fx  . S/P radiation therapy 12/11/2012-12/21/2012    Rectum and anus / 12.5 Gy at 2.5 Gy per fraction x 5 fractions delivered every other day  . Degeneration macular   . 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 CT scan of the chest, abdomen, and pelvis with IV contrast carried out on 04/13/2009 showed a right inguinal lymph node measuring 4.2 x 3.0 cm. There were right inguinal lymph nodes, the largest of which measured 2.2 x 2.1 cm. If we go back to the time that the CT  scan on 03/16/2009, there was concern that the patient had a malignant process, which has now been confirmed. It appears that this patient's cancer of the anal canal goes back at least to early 2011. CT scan of the chest, abdomen, and pelvis with IV contrast carried out on 04/13/2012 now shows pararenal mass lesion measuring 3.9 x 5.1 cm on image 109. The right pelvic sidewall lymph node mass now measures 4.9 x 3.2 cm on image 92 in the right inguinal lymph node measures 4.8 x 3.0 cm on image 103. These lymph nodes have increased compared with prior previous CT scans from 02/02/2012. The patient  received radiation to the pelvis and inguinal lymph nodes under the direction of Dr. Thea Silversmith. She received 15 Gy in 6 fractions over 9 days from 05/11/2012 through 05/19/2012. Radiation treatments needed to be stopped because of GI toxicity and a marked decline in the patient's performance status.  INTERIM HISTORY: has HYPERTENSION; Mitral regurgitation; Carotid artery disease; Drug therapy; Clot; Ejection fraction; Lung disease, interstitial; CHF (congestive heart failure); Dyslipidemia; CAD (coronary artery disease); Rheumatoid arthritis on chronic steroids; Hiatal hernia; Lymphadenopathy, abdominal; Cholelithiasis; Acute cholecystitis; Hypokalemia; Normocytic anemia; Bacteremia due to Escherichia coli; Aortic stenosis; Bright red blood per rectum; Anal squamous cell carcinoma; Cancer; Edema; Anemia, unspecified; Diarrhea; Dehydration; Protein-calorie malnutrition, severe; Pancolitis; Colitis; Nausea & vomiting; Sepsis; Acute diastolic heart failure; Orthostatic hypotension; and Diastolic dysfunction on her problem list.    ALLERGIES:  is allergic to ativan.  MEDICATIONS: has a current medication list which includes the following prescription(s): acetaminophen, amitriptyline, aspirin-acetaminophen-caffeine, diphenoxylate-atropine, ensure plus, esomeprazole, feeding supplement (resource breeze), furosemide, hydroxypropyl methylcellulose, leflunomide, loperamide, loperamide, metoprolol succinate, multiple vitamins-minerals, ondansetron, potassium chloride sa, prednisone, and saccharomyces boulardii, and the following Facility-Administered Medications: sodium chloride.  SURGICAL HISTORY:  Past Surgical History  Procedure Laterality Date  . Appendectomy    . Breast surgery    . Coronary angioplasty with stent placement  2003  . Colonoscopy N/A 04/08/2012    Procedure: COLONOSCOPY;  Surgeon: Winfield Cunas., MD;  Location[facility] ENDOSCOPY;  Service: Endoscopy;  Laterality: N/A;  . Lymph node  biopsy  04/12/12    right inguinal lymph node-squamous cell carcinoma  . Tonsillectomy      REVIEW OF SYSTEMS:   Constitutional: Denies fevers, chills or abnormal weight loss Eyes: Denies blurriness of vision Ears, nose, mouth, throat, and face: Denies mucositis or sore throat Respiratory: Reports cough but denies dyspnea or wheezes Cardiovascular: Denies palpitation, chest discomfort or lower extremity swelling Gastrointestinal:  Denies nausea, heartburn or change in bowel habits Skin: Denies abnormal skin rashes Lymphatics: Denies new lymphadenopathy or easy bruising Neurological:Denies numbness, tingling or new weaknesses Behavioral/Psych: Mood is stable, no new changes  All other systems were reviewed with the patient and are negative.  PHYSICAL EXAMINATION: ECOG PERFORMANCE STATUS: 2 - Symptomatic, <50% confined to bed  Blood pressure 122/69, pulse 84, temperature 96.9 F (36.1 C), temperature source Oral, resp. rate 18, height 5\' 5"  (1.651 m), weight 91 lb 3.2 oz (41.368 kg).  GENERAL:alert, no distress and comfortable; sitting in wheelchair. Chronically ill appearing.  SKIN: skin color, texture, turgor are normal, no rashes or significant lesions EYES: normal, Conjunctiva are pink and non-injected, sclera clear OROPHARYNX:no exudate, no erythema and lips, buccal mucosa, and tongue normal  NECK: supple, thyroid normal size, non-tender, without nodularity LYMPH:  no palpable lymphadenopathy in the cervical, axillary or supraclavicular.  R groin adenopathy as noted on prior exams. LUNGS: clear to auscultation and percussion with  normal breathing effort HEART: regular rate & rhythm and a systolic ejection murmur grade 2/6 and no lower extremity edema ABDOMEN:abdomen soft, non-tender and normal bowel sounds Musculoskeletal:no cyanosis of digits and no clubbing; severe deforming rheumatoid arthritis in the hands bilaterally  NEURO: alert & oriented x 3 with fluent speech, no focal  motor/sensory deficits   LABORATORY DATA: Results for orders placed in visit on 08/03/13 (from the past 48 hour(s))  MAGNESIUM (CC13)     Status: None   Collection Time    08/03/13 10:05 AM      Result Value Ref Range   Magnesium 1.8  1.5 - 2.5 mg/dl  CBC WITH DIFFERENTIAL     Status: Abnormal   Collection Time    08/03/13 10:05 AM      Result Value Ref Range   WBC 7.3  3.9 - 10.3 10e3/uL   NEUT# 5.0  1.5 - 6.5 10e3/uL   HGB 10.9 (*) 11.6 - 15.9 g/dL   HCT 33.3 (*) 34.8 - 46.6 %   Platelets 295  145 - 400 10e3/uL   MCV 96.8  79.5 - 101.0 fL   MCH 31.5  25.1 - 34.0 pg   MCHC 32.6  31.5 - 36.0 g/dL   RBC 3.45 (*) 3.70 - 5.45 10e6/uL   RDW 14.6 (*) 11.2 - 14.5 %   lymph# 1.0  0.9 - 3.3 10e3/uL   MONO# 1.1 (*) 0.1 - 0.9 10e3/uL   Eosinophils Absolute 0.2  0.0 - 0.5 10e3/uL   Basophils Absolute 0.1  0.0 - 0.1 10e3/uL   NEUT% 68.7  38.4 - 76.8 %   LYMPH% 13.5 (*) 14.0 - 49.7 %   MONO% 14.7 (*) 0.0 - 14.0 %   EOS% 2.4  0.0 - 7.0 %   BASO% 0.7  0.0 - 2.0 %  COMPREHENSIVE METABOLIC PANEL (WF09)     Status: Abnormal   Collection Time    08/03/13 10:05 AM      Result Value Ref Range   Sodium 139  136 - 145 mEq/L   Potassium 3.2 (*) 3.5 - 5.1 mEq/L   Chloride 103  98 - 109 mEq/L   CO2 27  22 - 29 mEq/L   Glucose 101  70 - 140 mg/dl   BUN 7.9  7.0 - 26.0 mg/dL   Creatinine 0.6  0.6 - 1.1 mg/dL   Total Bilirubin 0.34  0.20 - 1.20 mg/dL   Alkaline Phosphatase 159 (*) 40 - 150 U/L   AST 17  5 - 34 U/L   ALT 6  0 - 55 U/L   Total Protein 5.8 (*) 6.4 - 8.3 g/dL   Albumin 2.8 (*) 3.5 - 5.0 g/dL   Calcium 8.7  8.4 - 10.4 mg/dL   Anion Gap 9  3 - 11 mEq/L    RADIOGRAPHIC STUDIES: None  ASSESSMENT: 1. Squamous cell carcinoma of the anal canal, stage TII and 2, 3B, with biopsy of the primary tumor carried out on 04/09/2012 and core needle biopsies of a right inguinal lymph node on April 12 2012. Current treatment held secondary to poor functional status, decrease by mouth intake. 2.  Intractable diarrhea s/p cipro/flagyl. 3. FTT. 4. Mild hypokalemia secondary to #2.  PLAN:  --Ms. Joneen Caraway is condition continues to be concerning for her diarrhea.  She reports persistence in her diarrhea despite completion of her cipro/flagyl with a pretty lengthy hospitalizations.  She had a repeat CT of abdomen demonstrating pan colitis.   We recommended to  continue her lomital as scheduled and to include imodium as tolerated. We will give her one liter of NS today.  However she remains very frail.   Given her performance status and her indolent disease, hospice at home is a reasonable decision.  --All questions were answered. The patient knows to call the clinic with any problems, questions or concerns. We can certainly see the patient much sooner if necessary. Patient was provided after visit summary today's visit.   I spent 15 minutes counseling the patient face to face. The total time spent in the appointment was 25 minutes.    Cyana Shook, MD 08/04/2013 10:23 AM

## 2013-08-07 ENCOUNTER — Ambulatory Visit (INDEPENDENT_AMBULATORY_CARE_PROVIDER_SITE_OTHER): Payer: Medicare Other | Admitting: Podiatry

## 2013-08-07 ENCOUNTER — Telehealth: Payer: Self-pay | Admitting: *Deleted

## 2013-08-07 ENCOUNTER — Encounter: Payer: Self-pay | Admitting: Podiatry

## 2013-08-07 DIAGNOSIS — B351 Tinea unguium: Secondary | ICD-10-CM

## 2013-08-07 DIAGNOSIS — L97509 Non-pressure chronic ulcer of other part of unspecified foot with unspecified severity: Secondary | ICD-10-CM

## 2013-08-07 DIAGNOSIS — M79676 Pain in unspecified toe(s): Secondary | ICD-10-CM

## 2013-08-07 DIAGNOSIS — M79609 Pain in unspecified limb: Secondary | ICD-10-CM | POA: Diagnosis not present

## 2013-08-07 MED ORDER — MUPIROCIN 2 % EX OINT: 1.0000 "application " | TOPICAL_OINTMENT | Freq: Two times a day (BID) | CUTANEOUS | Status: DC

## 2013-08-07 NOTE — Telephone Encounter (Signed)
Terese RN called asking what patient's potassium level = on 08-03-2013 and asked if patient is to take two additional potassium.  No new orders for potassium.  Potassium level = 3.2 on 08-03-2013 and patient to take two 74meq potassium tablets daily.  Wynell Balloon reports patient had only been taking one tablet daily but will start taking two.

## 2013-08-07 NOTE — Progress Notes (Signed)
She presents today for followup of ulcerations to the first metatarsophalangeal joint bilaterally. She states that they're hurting.  Objective: Vital signs are stable oriented x3 pulses are still palpable bilateral. Rheumatoid arthritis has resulted in superficial ulcerations first metatarsophalangeal joint right foot. There is no erythema edema saline is drainage or odor at this time is very small superficial ulcerations are present. Nails are thick yellow dystrophic onychomycotic and painful palpation with severe hammertoe deformities.  Assessment: Pain in limb secondary to onychomycosis and rheumatoid arthritis with ulcerations medial aspect of the first metatarsophalangeal joint right foot.  Plan: Debridement of nails and reactive hyperkeratotic ulcers. Followup with her in a few months.

## 2013-08-09 ENCOUNTER — Ambulatory Visit: Payer: Medicare Other | Admitting: Radiation Oncology

## 2013-08-17 ENCOUNTER — Telehealth: Payer: Self-pay | Admitting: *Deleted

## 2013-08-17 DIAGNOSIS — C210 Malignant neoplasm of anus, unspecified: Secondary | ICD-10-CM

## 2013-08-17 MED ORDER — HYDROCORTISONE ACE-PRAMOXINE 1-1 % RE FOAM: 1.0000 | Freq: Two times a day (BID) | RECTAL | Status: AC

## 2013-08-17 NOTE — Telephone Encounter (Signed)
PROCTOFOAM HC WAS ADDED TO PT.'S MEDICATION LIST.

## 2013-08-23 ENCOUNTER — Other Ambulatory Visit: Payer: Self-pay

## 2013-08-23 MED ORDER — METOPROLOL SUCCINATE ER 25 MG PO TB24: 25.0000 mg | ORAL_TABLET | Freq: Every evening | ORAL | Status: DC

## 2013-08-24 ENCOUNTER — Other Ambulatory Visit: Payer: Self-pay

## 2013-08-24 ENCOUNTER — Telehealth: Payer: Self-pay | Admitting: *Deleted

## 2013-08-24 MED ORDER — METOPROLOL SUCCINATE ER 25 MG PO TB24: 25.0000 mg | ORAL_TABLET | Freq: Every evening | ORAL | Status: DC

## 2013-08-24 NOTE — Telephone Encounter (Signed)
Helene Kelp, nurse from Hospice, called stating patient needs Metoprolol refill called in to pharmacy, as she is out of medication, as of today. Refill completed for one month. Label states patient needs to schedule appointment with Dr. Ron Parker.

## 2013-08-25 ENCOUNTER — Telehealth: Payer: Self-pay | Admitting: Cardiology

## 2013-08-25 NOTE — Telephone Encounter (Signed)
Sonia Baller with Hospice called. The pt's Toprol ran out last week and she was called in a new Rx this past Friday that was 25 mg daily. The family tells her that she takes 100 mg daily. I asked them to call office Monday to speak with Dr Kae Heller nurse, in the meantime take 25 mg Toprol daily.   Kerin Ransom PA-C 08/25/2013 2:02 PM

## 2013-08-27 ENCOUNTER — Telehealth: Payer: Self-pay | Admitting: Cardiology

## 2013-08-27 NOTE — Telephone Encounter (Signed)
Pts Hospice Nurse and Director of Hospice center called to give pts current health condition.  According to the Hospice Nurse (DON)- this is a newer Hospice Pt as of May 2015.  Hospice Nurse states that pt receives hospice within the home.  Hospice Nurse states "I'm not quite sure why the son wants her to continue on Toprol xl 100 mg daily, as Toprol 25 mg daily is sufficient enough for the pt to maintain.  Hospice nurse states that pts V/S have been WNL at last reading -110/78 HR-90 and another reading showing 98/54.  Hospice Nurse stated that [facility] PA addressed this issue last week prescribing pt 25 mg Toprol daily.  Hospice nurse states that pt has lost 6 lbs since admitted to hospice, and doesn't eat or drink enough.  Hospice nurse asked if pt has seen Dr Ron Parker lately, because as noted on her prior prescription of Toprol xl 100mg  is stated that pt would have to schedule a follow-up ov to have this med refilled.  Informed nurse that she hasn't seen Dr Ron Parker since 5/14.  Hospice nurse stated that pt would probably not be able to come in for a OV due current health status.  Informed Hospice nurse that Dr Ron Parker and nurse are both out of the office today, but I will certainly forward this message to them for further review and recommendation.  Hospice nurse agreed with this plan.  Hospice nurse stated that she would call the son back to explain to him that currently the pt should only take Toprol 25 mg daily.  Hospice nurse stated that if pt became symptomatic at all, then she can obtain telephone orders from the Hospice MD to further advise.  Will route this message to Dr Ron Parker and nurse.

## 2013-08-27 NOTE — Telephone Encounter (Signed)
°  Patient has questions regarding medication ( toprol). Please call and advise.

## 2013-08-27 NOTE — Telephone Encounter (Signed)
Son is calling to talk to Dr Ron Parker and nurse as instructed by P.A. Kerin Ransom, about the pts Toprol XL.  Son states that his mom's Toprol xl 100 mg daily ran out last week.  Son called the office and Lurena Joiner PA addressed this, ordering for the pt to take 25 mg Toprol XL daily, and call Dr Ron Parker for further recommendation on 7/20.  Informed son that Dr Ron Parker and nurse are both out of the office today.  Son states that his mom is a hospice pt and has been takine Toprol xl 100 mg for 10 years.   Asked son what is the name of the Hospice Nurse or center where I can call and obtain more information on pts current health condition.  Son is not sure what the name of the Hospice service is but does know that the Hospice nurse name is [name].  Son states that he has the # for [place] and he will get her to call me to give further information on pts condition.  Noted in pts chart that she has not seen Dr Ron Parker since 06/23/12.

## 2013-08-30 ENCOUNTER — Ambulatory Visit: Admitting: Physician Assistant

## 2013-08-30 ENCOUNTER — Other Ambulatory Visit: Payer: Self-pay

## 2013-08-30 MED ORDER — METOPROLOL SUCCINATE ER 25 MG PO TB24: 25.0000 mg | ORAL_TABLET | Freq: Every evening | ORAL | Status: DC

## 2013-09-18 ENCOUNTER — Ambulatory Visit: Payer: Medicare Other | Admitting: Podiatry

## 2013-09-20 ENCOUNTER — Ambulatory Visit: Admitting: Radiation Oncology

## 2013-09-21 ENCOUNTER — Other Ambulatory Visit: Payer: Self-pay

## 2013-09-21 DIAGNOSIS — C210 Malignant neoplasm of anus, unspecified: Secondary | ICD-10-CM

## 2013-09-21 MED ORDER — DIPHENOXYLATE-ATROPINE 2.5-0.025 MG PO TABS: 1.0000 | ORAL_TABLET | Freq: Four times a day (QID) | ORAL | Status: AC | PRN

## 2013-09-21 NOTE — Telephone Encounter (Signed)
[name] with  Hospice called asking for lomotil refill. S/w chism and done.

## 2013-10-07 ENCOUNTER — Encounter: Payer: Self-pay | Admitting: Cardiology

## 2013-10-08 ENCOUNTER — Ambulatory Visit (INDEPENDENT_AMBULATORY_CARE_PROVIDER_SITE_OTHER): Admitting: Cardiology

## 2013-10-08 ENCOUNTER — Encounter: Payer: Self-pay | Admitting: Cardiology

## 2013-10-08 VITALS — BP 102/56 | HR 97 | Ht 65.0 in | Wt 95.4 lb

## 2013-10-08 DIAGNOSIS — I350 Nonrheumatic aortic (valve) stenosis: Secondary | ICD-10-CM

## 2013-10-08 DIAGNOSIS — R609 Edema, unspecified: Secondary | ICD-10-CM

## 2013-10-08 DIAGNOSIS — I359 Nonrheumatic aortic valve disorder, unspecified: Secondary | ICD-10-CM

## 2013-10-08 DIAGNOSIS — I10 Essential (primary) hypertension: Secondary | ICD-10-CM

## 2013-10-08 DIAGNOSIS — I2510 Atherosclerotic heart disease of native coronary artery without angina pectoris: Secondary | ICD-10-CM

## 2013-10-08 DIAGNOSIS — C210 Malignant neoplasm of anus, unspecified: Secondary | ICD-10-CM

## 2013-10-08 DIAGNOSIS — R600 Localized edema: Secondary | ICD-10-CM | POA: Insufficient documentation

## 2013-10-08 DIAGNOSIS — I951 Orthostatic hypotension: Secondary | ICD-10-CM

## 2013-10-08 MED ORDER — METOPROLOL SUCCINATE ER 25 MG PO TB24: 25.0000 mg | ORAL_TABLET | Freq: Every evening | ORAL | Status: AC

## 2013-10-08 NOTE — Assessment & Plan Note (Signed)
Coronary disease is stable. She had an intervention in 2003. No further workup.

## 2013-10-08 NOTE — Progress Notes (Signed)
Patient ID: [name], female   DOB: 10/02/1925, 80 y.o.   MRN: [id number]    HPI  Patient is seen today to followup congestive heart failure and swelling of her right leg and valvular heart disease. I saw her last May, 2014. At that time she had recently had a new diagnosis of squamous cell carcinoma of the rectum. She's been receiving radiation treatments. She's had diarrhea. In April, with diarrhea she had orthostatic hypotension. She was admitted to the hospital and her meds were adjusted. Her diarrhea is under better control. She had return of some edema and restarted her furosemide. Her labs have been followed. Her potassium was low and and is being treated. Since being in the hospital for orthostatic hypotension has not been a significant ongoing problem on her current medications. In the hospital she had a 2-D echo. Her aortic stenosis was moderate and stable. She had some mitral regurgitation. Her ejection fraction was 60%.  She is weak and frail but stable today. She's here with a family member. She has severe deforming rheumatoid arthritis.   As part of today's evaluation I carefully reviewed my note from last May. I've also reviewed recent labs and recent hospital documents.  Allergies  Allergen Reactions  . Ativan [Lorazepam]     Confused and out of it    Current Outpatient Prescriptions  Medication Sig Dispense Refill  . acetaminophen (TYLENOL) 500 MG tablet Take 1,000 mg by mouth every 6 (six) hours as needed for pain.      [name] Kitchen aspirin-acetaminophen-caffeine (EXCEDRIN EXTRA STRENGTH) 250-250-65 MG per tablet Take 2 tablets by mouth every 6 (six) hours as needed for pain.       . diphenoxylate-atropine (LOMOTIL) 2.5-0.025 MG per tablet Take 1 tablet by mouth 4 (four) times daily as needed for diarrhea or loose stools.  60 tablet  1  . Ensure Plus (ENSURE PLUS) LIQD Take 237 mLs by mouth 2 (two) times daily as needed (for suboptimal po intake.).      [name] Kitchen esomeprazole (NEXIUM) 40  MG capsule Take 1 capsule (40 mg total) by mouth daily before breakfast.  30 capsule  2  . feeding supplement, RESOURCE BREEZE, (RESOURCE BREEZE) LIQD Take 1 Container by mouth 2 (two) times daily between meals.  60 Container  1  . furosemide (LASIX) 20 MG tablet Take 20 mg by mouth daily. Pt does not take every day      . hydrocortisone-pramoxine (PROCTOFOAM HC) rectal foam Place 1 applicator rectally 2 (two) times daily.  10 g  0  . hydroxypropyl methylcellulose (ISOPTO TEARS) 2.5 % ophthalmic solution Place 1 drop into both eyes 3 (three) times daily as needed for dry eyes.      [name] Kitchen leflunomide (ARAVA) 20 MG tablet Take 20 mg by mouth daily.       [name] Kitchen loperamide (IMODIUM) 2 MG capsule Take 1 capsule (2 mg total) by mouth 3 (three) times daily. Maximum dose upto 16 mg/day  60 capsule  0  . metoprolol succinate (TOPROL-XL) 25 MG 24 hr tablet Take 1 tablet (25 mg total) by mouth every evening. Take with or immediately following a meal.  30 tablet  1  . mirtazapine (REMERON) 15 MG tablet Take 15 mg by mouth at bedtime.      . Multiple Vitamins-Minerals (PRESERVISION AREDS PO) Take 1 capsule by mouth daily.      . ondansetron (ZOFRAN) 8 MG tablet Take 1 tablet (8 mg total) by mouth every 8 (eight) hours as  needed for nausea or vomiting.  20 tablet  1  . potassium chloride SA (K-DUR,KLOR-CON) 20 MEQ tablet Take 20 mEq by mouth daily.      . predniSONE (DELTASONE) 5 MG tablet Take 5 mg by mouth daily.         No current facility-administered medications for this visit.   Facility-Administered Medications Ordered in Other Visits  Medication Dose Route Frequency Provider Last Rate Last Dose  . 0.9 %  sodium chloride infusion  1,000 mL Intravenous Once Concha Norway, MD        History   Social History  . Marital Status: Widowed    Spouse Name: N/A    Number of Children: N/A  . Years of Education: N/A   Occupational History  . Retired    Social History Main Topics  . Smoking status: Never Smoker     . Smokeless tobacco: Never Used  . Alcohol Use: No  . Drug Use: No  . Sexual Activity: Not on file   Other Topics Concern  . Not on file   Social History Narrative   Lives with son in [place], [place]. She is retired.    Family History  Problem Relation Age of Onset  . Heart disease Mother   . Heart disease Father     Past Medical History  Diagnosis Date  . CAD (coronary artery disease)     2 vessel intervention 2003  . Dyslipidemia   . CHF (congestive heart failure)     single episode  . Mitral regurgitation     mild prolapse anterior & posterior leaflets  . Aortic valve sclerosis     mild moderate calcification, echo, 2009  . HTN (hypertension)     difficult to obtain BP at times.  [name] Kitchen Urethral trauma     bleeding in hospital  . Rheumatoid arthritis(714.0)     severe - deforming  . Lung disease, interstitial     related to rheumatoid arthritis, also question  of left apical nodule...followed elsewhere..my understanding stabilized  . Carotid artery disease     doppler 12/25/2010,  0-39% bilateral  . Ejection fraction     EF 60%, echo, 11/2007  . Clot     ??? apical clot in the past ?? no longer an issue  . Drug therapy     Prednisone.  . Depression   . GERD (gastroesophageal reflux disease)   . Cataract   . Aortic stenosis     Moderately severe, echo, December, 2013  . Rectal bleed 04/06/2012  . Anemia   . Edema     May, 2014  . History of radiation therapy 05/11/12-05/19/12    pelvis/inguinal lymphnodes/only able to complete 6 fx  . S/P radiation therapy 12/11/2012-12/21/2012    Rectum and anus / 12.5 Gy at 2.5 Gy per fraction x 5 fractions delivered every other day  . Degeneration macular   . 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 Cancer     rectal    Past Surgical History  Procedure Laterality Date  . Appendectomy    . Breast surgery    . Coronary angioplasty with stent placement  2003  . Colonoscopy N/A 04/08/2012     Procedure: COLONOSCOPY;  Surgeon: Winfield Cunas., MD;  Location: [facility] ENDOSCOPY;  Service: Endoscopy;  Laterality: N/A;  . Lymph node biopsy  04/12/12    right inguinal lymph node-squamous cell carcinoma  . Tonsillectomy  Patient Active Problem List   Diagnosis Date Noted  . Edema leg 10/08/2013  . Orthostatic hypotension 05/30/2013  . Diastolic dysfunction 52/84/1324  . Acute diastolic heart failure 40/11/2723  . Sepsis 05/17/2013  . Colitis 05/16/2013  . Nausea & vomiting 05/16/2013  . Pancolitis 05/02/2013  . Protein-calorie malnutrition, severe 04/30/2013  . Diarrhea 04/29/2013  . Dehydration 04/29/2013  . Anemia, unspecified 07/04/2012  . Edema   . Cancer   . Anal squamous cell carcinoma 04/13/2012  . Bright red blood per rectum 04/06/2012  . Aortic stenosis   . Hypokalemia 02/05/2012  . Normocytic anemia 02/05/2012  . Bacteremia due to Escherichia coli 02/05/2012  . Lymphadenopathy, abdominal 02/03/2012  . Cholelithiasis 02/03/2012  . Acute cholecystitis 02/03/2012  . Hiatal hernia 02/02/2012  . Mitral regurgitation   . Carotid artery disease   . Drug therapy   . Clot   . Ejection fraction   . Lung disease, interstitial   . CHF (congestive heart failure)   . Dyslipidemia   . CAD (coronary artery disease)   . Rheumatoid arthritis on chronic steroids   . HYPERTENSION 10/26/2008    ROS   patient denies fever, chills, headache, sweats, rash, change in vision, change in hearing, chest pain, cough, nausea vomiting, urinary symptoms. All other systems are reviewed and are negative.  PHYSICAL EXAM   She is oriented to person time and place. Affect is normal. She is frail. She has severe deforming rheumatoid arthritis. She is here with a family member. Head is atraumatic. Sclera and conjunctiva are normal. There is no jugulovenous distention. Lungs reveal a few basilar rales. There is no respiratory distress. Cardiac exam reveals a systolic murmur. The abdomen is  soft. She has 1-2+ edema of the right leg. There is no edema in her left leg. She has severe deforming rheumatoid arthritis. There are no skin rashes.  Filed Vitals:   10/08/13 0926  BP: 102/56  Pulse: 97  Height: 5\' 5"  (1.651 m)  Weight: 95 lb 6.4 oz (43.273 kg)     ASSESSMENT & PLAN

## 2013-10-08 NOTE — Assessment & Plan Note (Signed)
This is a severe problem for the patient. She gets diarrhea from the radiation treatments. She is under hospice care.

## 2013-10-08 NOTE — Assessment & Plan Note (Signed)
Her volume status now is stable. No change in therapy.

## 2013-10-08 NOTE — Assessment & Plan Note (Signed)
Blood pressures control. No change in therapy. 

## 2013-10-08 NOTE — Assessment & Plan Note (Signed)
Her aortic stenosis is moderate by echo April, 2015. No further workup is needed.

## 2013-10-08 NOTE — Assessment & Plan Note (Signed)
She has edema the right leg. Some of this may be due to her cancer. Her current diuretic dose appears to keep her overall status stable. No further workup.

## 2013-10-08 NOTE — Assessment & Plan Note (Signed)
She had significant orthostasis in April 3 the hospitalization. She had significant diarrhea at that time. Currently her diarrhea is under better control and her diuretic dosing he is working for her. No change in therapy.  As part of today's evaluation I spent greater than 25 minutes with a total care. More than half of this time is been with direct contact with the patient and her family talking about all her issues.

## 2013-10-08 NOTE — Patient Instructions (Signed)
Your physician recommends that you continue on your current medications as directed. Please refer to the Current Medication list given to you today.  Your physician wants you to follow-up in: 1 year. You will receive a reminder letter in the mail two months in advance. If you don't receive a letter, please call our office to schedule the follow-up appointment.  

## 2013-10-25 ENCOUNTER — Ambulatory Visit
Admission: RE | Admit: 2013-10-25 | Discharge: 2013-10-25 | Disposition: A | Discharge status: Home / Self Care | Payer: Medicare Other | Source: Ambulatory Visit | Attending: Radiation Oncology | Admitting: Radiation Oncology

## 2013-10-25 VITALS — BP 114/70 | HR 104 | Temp 98.2°F

## 2013-10-25 DIAGNOSIS — C210 Malignant neoplasm of anus, unspecified: Secondary | ICD-10-CM

## 2013-10-25 NOTE — Progress Notes (Signed)
Department of Radiation Oncology  Phone:  [phone] Fax:        [phone]   Name: [name] MRN: [id number]  DOB: [birth year]  Date: 10/25/2013  Follow Up Visit Note  Diagnosis: Locally advanced anal cancer  Summary of radiation: Palliative radiation to a total dose of 15 Gy in 6 fractions to the anus and left inguinal nodes completed 05/19/12 then an additional 12.5 Gy in 5 fractions just to the anus completed 12/21/12  Interval History: [name] presents today for routine followup.  She is feeling well. She was in and out of the hospital over the past few months and just now is regaining her strength. She is followed  By hospice and is eating well since being started on remeron. She is accompanied by her son. Her main complaints are of right leg swelling. She is taking Lasix for this but feels it just makes her dizzy and weak. She has no rectal bleeding (although she admits she can't see very well so she's not sure) and has well controlled bowel movements. She takes immodium and miralax. She is debating on whether she would like to see another oncologist or not.   Allergies:  Allergies  Allergen Reactions  . Ativan [Lorazepam]     Confused and out of it    Medications:  Current Outpatient Prescriptions  Medication Sig Dispense Refill  . acetaminophen (TYLENOL) 500 MG tablet Take 1,000 mg by mouth every 6 (six) hours as needed for pain.      [name] Kitchen aspirin-acetaminophen-caffeine (EXCEDRIN EXTRA STRENGTH) 250-250-65 MG per tablet Take 2 tablets by mouth every 6 (six) hours as needed for pain.       . diphenoxylate-atropine (LOMOTIL) 2.5-0.025 MG per tablet Take 1 tablet by mouth 4 (four) times daily as needed for diarrhea or loose stools.  60 tablet  1  . Ensure Plus (ENSURE PLUS) LIQD Take 237 mLs by mouth 2 (two) times daily as needed (for suboptimal po intake.).      [name] Kitchen esomeprazole (NEXIUM) 40 MG capsule Take 1 capsule (40 mg total) by mouth daily before breakfast.  30  capsule  2  . feeding supplement, RESOURCE BREEZE, (RESOURCE BREEZE) LIQD Take 1 Container by mouth 2 (two) times daily between meals.  60 Container  1  . furosemide (LASIX) 20 MG tablet Take 20 mg by mouth daily. Pt does not take every day      . hydrocortisone-pramoxine (PROCTOFOAM HC) rectal foam Place 1 applicator rectally 2 (two) times daily.  10 g  0  . hydroxypropyl methylcellulose (ISOPTO TEARS) 2.5 % ophthalmic solution Place 1 drop into both eyes 3 (three) times daily as needed for dry eyes.      [name] Kitchen leflunomide (ARAVA) 20 MG tablet Take 20 mg by mouth daily.       [name] Kitchen loperamide (IMODIUM) 2 MG capsule Take 1 capsule (2 mg total) by mouth 3 (three) times daily. Maximum dose upto 16 mg/day  60 capsule  0  . metoprolol succinate (TOPROL-XL) 25 MG 24 hr tablet Take 1 tablet (25 mg total) by mouth every evening. Take with or immediately following a meal.  90 tablet  3  . mirtazapine (REMERON) 15 MG tablet Take 15 mg by mouth at bedtime.      . Multiple Vitamins-Minerals (PRESERVISION AREDS PO) Take 1 capsule by mouth daily.      . ondansetron (ZOFRAN) 8 MG tablet Take 1 tablet (8 mg total) by mouth every 8 (eight) hours as needed for  nausea or vomiting.  20 tablet  1  . potassium chloride SA (K-DUR,KLOR-CON) 20 MEQ tablet Take 20 mEq by mouth daily.      . predniSONE (DELTASONE) 5 MG tablet Take 5 mg by mouth daily.         No current facility-administered medications for this encounter.   Facility-Administered Medications Ordered in Other Encounters  Medication Dose Route Frequency Provider Last Rate Last Dose  . 0.9 %  sodium chloride infusion  1,000 mL Intravenous Once Concha Norway, MD        Physical Exam:  Filed Vitals:   10/25/13 1528  BP: 114/70  Pulse: 104  Temp: 98.2 F (36.8 C)  SpO2: 98%   Hard palpable nodes in the right groin, inferior near the vulva. Right lower extremity pitting edema. Alert and oriented  IMPRESSION: [name] is a 80 y.o. female with locally advanced  anal cancer and progressive inguinal adenopathy.  PLAN:  We discussed her case. I offered her RT to her groin which she and her son have turned down for now.  She is stable right now and they don't want to "upset the apple cart". She will let me know if her swelling gets to the point where she is willing to accept RT.  I'm not sure there is a role for medical oncology but can surely refer her if she likes.  She is not really interested right now but may be in the future.  We decided to make a follow up for me in December which she or her son can certainly cancel if they feel she is doing fine and are not interested in treatment. We can revisit the referral back to medical oncology at that time as well.     Thea Silversmith, MD

## 2014-01-24 ENCOUNTER — Encounter: Payer: Self-pay | Admitting: Radiation Oncology

## 2014-01-24 ENCOUNTER — Ambulatory Visit
Admission: RE | Admit: 2014-01-24 | Discharge: 2014-01-24 | Disposition: A | Discharge status: Home / Self Care | Payer: Medicare Other | Source: Ambulatory Visit | Attending: Radiation Oncology | Admitting: Radiation Oncology

## 2014-01-24 VITALS — BP 165/77 | HR 99 | Temp 97.8°F | Resp 20 | Wt 107.9 lb

## 2014-01-24 DIAGNOSIS — C210 Malignant neoplasm of anus, unspecified: Secondary | ICD-10-CM

## 2014-01-24 NOTE — Progress Notes (Signed)
Patient c/o arthritis pain in her neck, shoulders and legs. She states her right leg has continued to gradually swell more, but she has not been taking Lasix regularly. She is being followed by Hospice. She states she has diarrhea "maybe 2 times a week" but occassionally has constipation. She states her appetite "is okay".

## 2014-01-24 NOTE — Progress Notes (Signed)
Department of Radiation Oncology  Phone:  [phone] Fax:        [phone]   Name: LATYRA JAYE MRN: [id number]  DOB: 08-30-1925  Date: 01/24/2014  Follow Up Visit Note  Diagnosis: Locally advanced anal cancer  Summary of radiation: Palliative radiation to a total dose of 15 Gy in 6 fractions to the anus and left inguinal nodes completed 05/19/12 then an additional 12.5 Gy in 5 fractions just to the anus completed 12/21/12  Interval History: Sharrie presents today for routine followup.  She is feeling well overall. Her vision and arthritis pain bothers her. She admits to "some" pain with bowel movements but no bleeding. Her biggest complaint is continued right lower extremity swelling from the adenopathy in her groin. She has stopped Lasix for now as it was making her light headed and not helping the edema. Her son and daughter accompany her.   Allergies:  Allergies  Allergen Reactions  . Ativan [Lorazepam]     Confused and out of it    Medications:  Current Outpatient Prescriptions  Medication Sig Dispense Refill  . acetaminophen (TYLENOL) 500 MG tablet Take 1,000 mg by mouth every 6 (six) hours as needed for pain.    [name] Kitchen aspirin-acetaminophen-caffeine (EXCEDRIN EXTRA STRENGTH) 250-250-65 MG per tablet Take 2 tablets by mouth every 6 (six) hours as needed for pain.     . diphenoxylate-atropine (LOMOTIL) 2.5-0.025 MG per tablet Take 1 tablet by mouth 4 (four) times daily as needed for diarrhea or loose stools. 60 tablet 1  . Ensure Plus (ENSURE PLUS) LIQD Take 237 mLs by mouth 2 (two) times daily as needed (for suboptimal po intake.).    [name] Kitchen esomeprazole (NEXIUM) 40 MG capsule Take 1 capsule (40 mg total) by mouth daily before breakfast. 30 capsule 2  . feeding supplement, RESOURCE BREEZE, (RESOURCE BREEZE) LIQD Take 1 Container by mouth 2 (two) times daily between meals. 60 Container 1  . hydroxypropyl methylcellulose (ISOPTO TEARS) 2.5 % ophthalmic solution Place 1 drop  into both eyes 3 (three) times daily as needed for dry eyes.    [name] Kitchen leflunomide (ARAVA) 20 MG tablet Take 20 mg by mouth daily.     [name] Kitchen loperamide (IMODIUM) 2 MG capsule Take 1 capsule (2 mg total) by mouth 3 (three) times daily. Maximum dose upto 16 mg/day 60 capsule 0  . metoprolol succinate (TOPROL-XL) 25 MG 24 hr tablet Take 1 tablet (25 mg total) by mouth every evening. Take with or immediately following a meal. 90 tablet 3  . mirtazapine (REMERON) 15 MG tablet Take 15 mg by mouth at bedtime.    . Multiple Vitamins-Minerals (PRESERVISION AREDS PO) Take 1 capsule by mouth daily.    . predniSONE (DELTASONE) 5 MG tablet Take 5 mg by mouth daily.      . furosemide (LASIX) 20 MG tablet Take 20 mg by mouth daily. Pt does not take every day    . hydrocortisone-pramoxine (PROCTOFOAM HC) rectal foam Place 1 applicator rectally 2 (two) times daily. (Patient not taking: Reported on 01/24/2014) 10 g 0  . potassium chloride SA (K-DUR,KLOR-CON) 20 MEQ tablet Take 20 mEq by mouth daily.     No current facility-administered medications for this encounter.   Facility-Administered Medications Ordered in Other Encounters  Medication Dose Route Frequency Provider Last Rate Last Dose  . 0.9 %  sodium chloride infusion  1,000 mL Intravenous Once Concha Norway, MD        Physical Exam:  Danley Danker Vitals:   01/24/14  1313  BP: 165/77  Pulse: 99  Temp: 97.8 F (36.6 C)  TempSrc: Oral  Resp: 20  Weight: 107 lb 14.4 oz (48.943 kg)   Hard palpable nodes in the right groin, inferior near the vulva. Right lower extremity pitting edema. Alert and oriented  IMPRESSION: Keylani is a 80 y.o. female with locally advanced anal cancer and progressive inguinal adenopathy.  PLAN:  We discussed her case. I offered her RT to her groin and/or anal mass. We had a discussion about what would happen without treatment. We discussed the risks of radiation and diarrhea again which she may not survive.  She would like to talk it over  with her family and then can either be scheduled for sim or for follow up in another month.   Thea Silversmith, MD

## 2014-02-08 IMAGING — CT CT CHEST W/O CM
1 of 4 series · 12 of 32 positions shown, 17 images · non-contrast
Comparison: CT chest, abdomen pelvis - 03/16/2009; gallbladder
ultrasound - earlier same day

CT CHEST

CLINICAL DATA: Abdominal pain, evaluate hiatal hernia, chest pain,
decreased oxygen saturation, nausea for 1

CT CHEST, ABDOMEN AND PELVIS WITHOUT CONTRAST
TECHNIQUE: Multidetector CT imaging of the chest, abdomen and
pelvis was performed following the standard protocol without IV
contrast.

[Series 2: abd/pel w/o · axial · non-contrast · 0.72mm/px · z∈[-493,-108]mm · 12 of 91 slices shown, 17 images]
[im 7/91  soft-tissue]
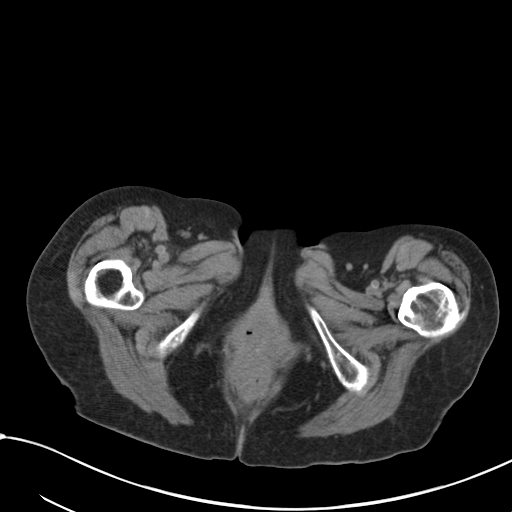
[im 7/91  bone]
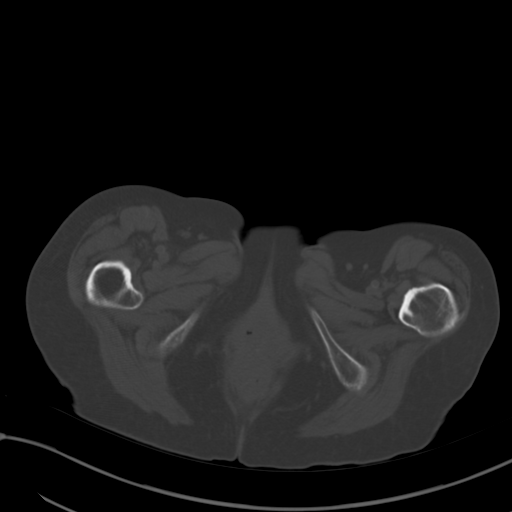
[im 13/91  soft-tissue]
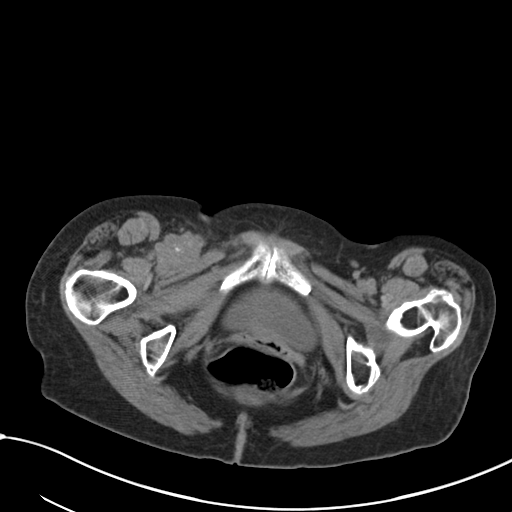
[im 20/91  soft-tissue]
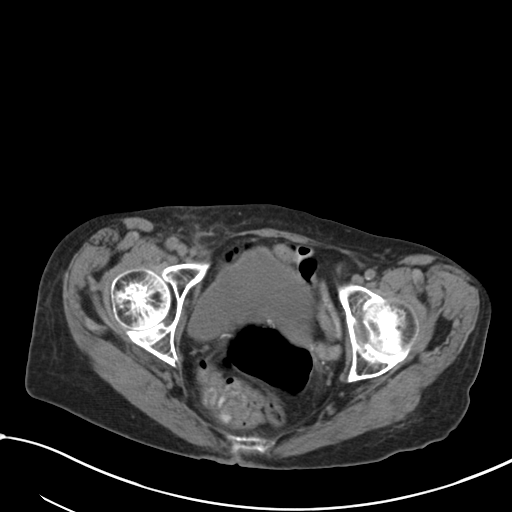
[im 33/91  soft-tissue]
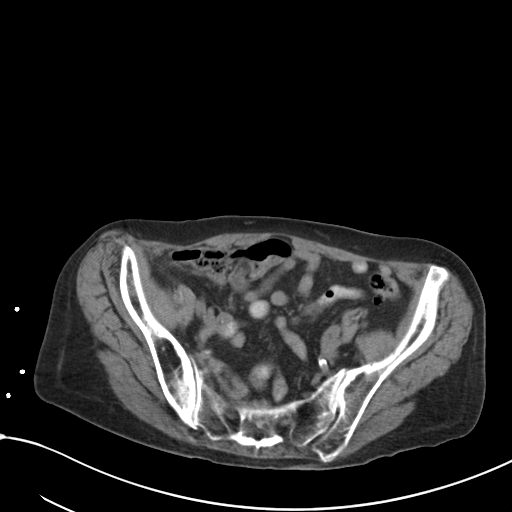
[im 39/91  soft-tissue]
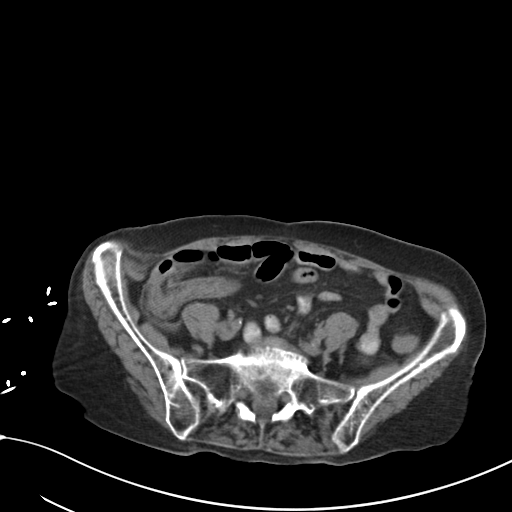
[im 46/91  soft-tissue]
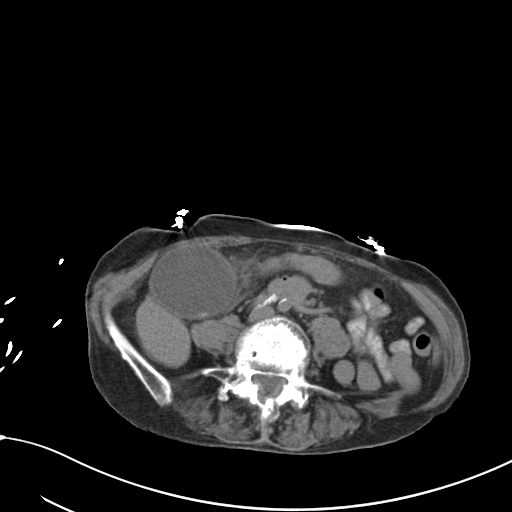
[im 52/91  soft-tissue]
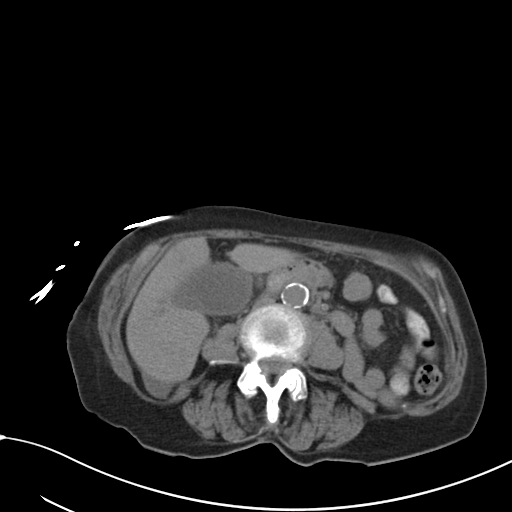
[im 58/91  soft-tissue]
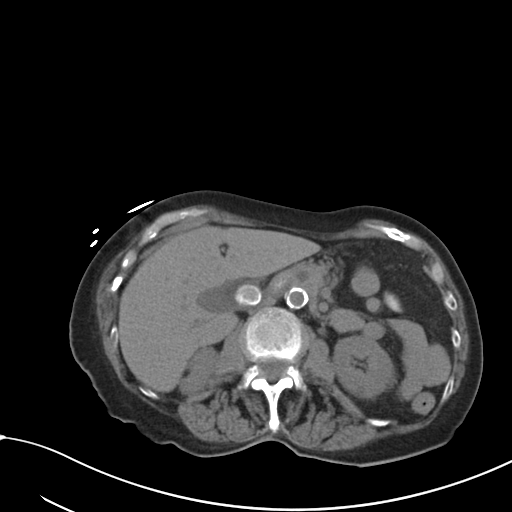
[im 65/91  lung]
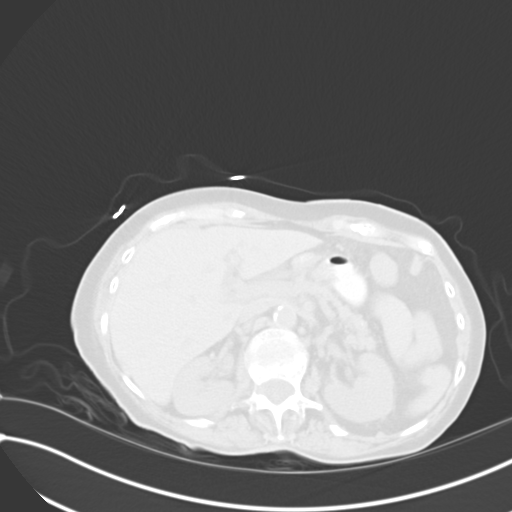
[im 71/91  soft-tissue]
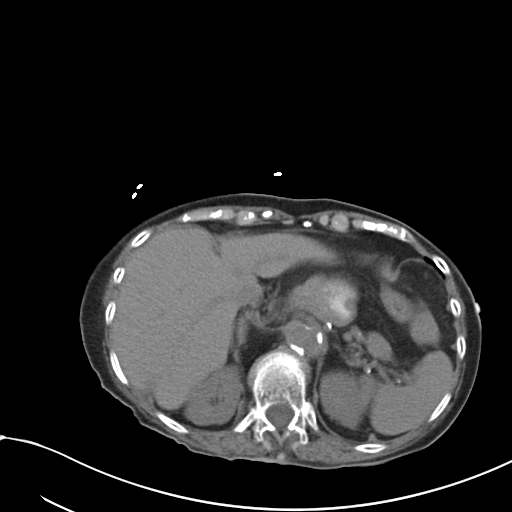
[im 71/91  lung]
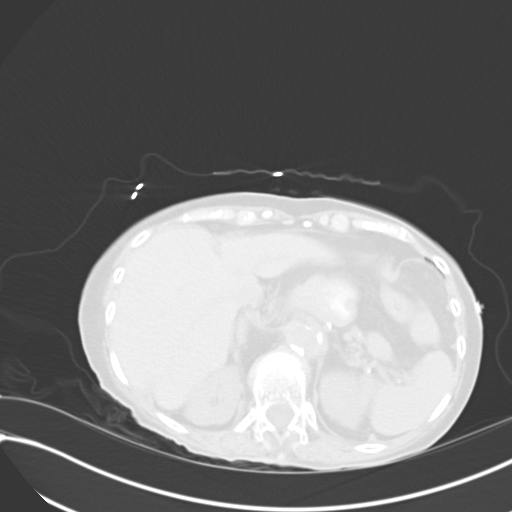
[im 71/91  bone]
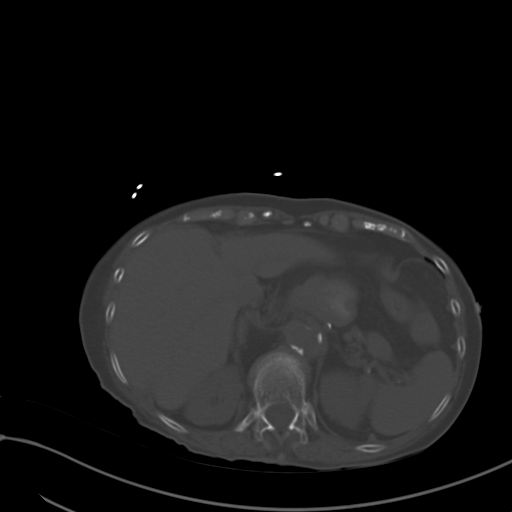
[im 78/91  soft-tissue]
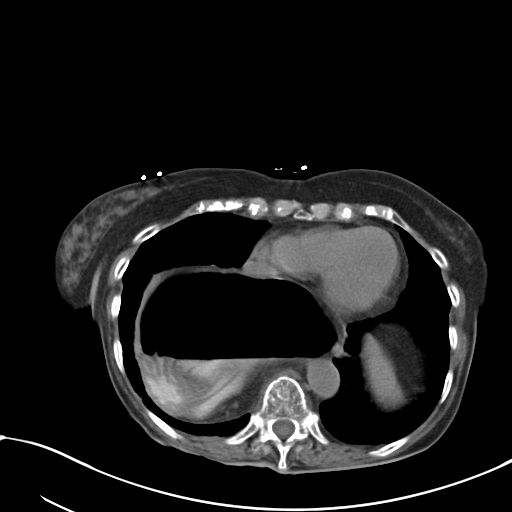
[im 78/91  lung]
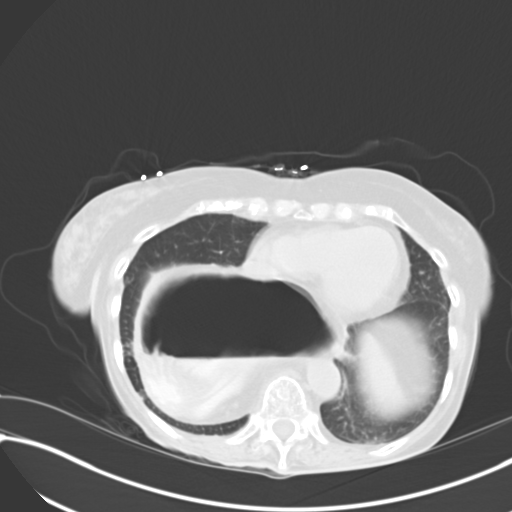
[im 84/91  soft-tissue]
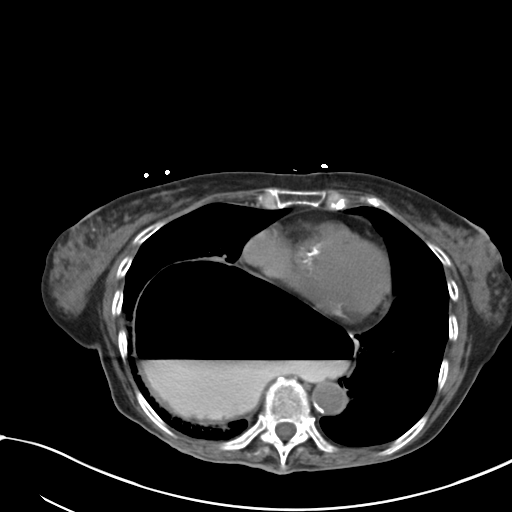
[im 84/91  lung]
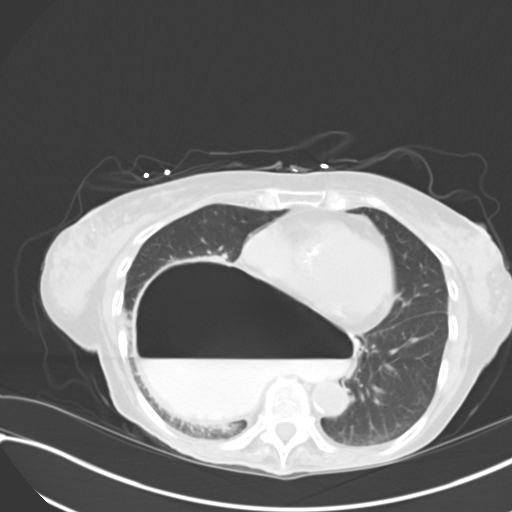

[12 of 32 positions shown; findings below may reference images not displayed]

FINDINGS: Previously noted large hiatal hernia previously contained the
stomach as well as a short segments of transverse colon.  The large
hiatal hernia and now contains the entirety of a markedly distended
stomach.  There is no definitive evidence of obstruction, as
ingested enteric contrast passes through the stomach and into the
duodenum and proximal small bowel.  The large hiatal hernia is
again noted to result in mass effect upon the heart, in particular
the left atrium.

Normal heart size.  Coronary artery calcifications.  Calcifications
of the mitral valve annulus and within the aortic aortic valve
leaflets.  No pericardial effusion.

Symmetric dependent subpleural ground-glass atelectasis.  No focal
airspace opacities.  No pleural effusion or pneumothorax.  The
central pulmonary airways are widely patent.

No definite mediastinal, hilar or axillary lymphadenopathy.

No acute or aggressive osseous abnormalities within the chest.
IMPRESSION: The entirety of a markedly distended stomach is seen within the
large hiatal hernia.  This does not result in enteric obstruction
as ingested enteric contrast passes through the stomach and is seen
within the duodenum and proximal small bowel.

CT ABDOMEN AND PELVIS
FINDINGS: The lack of intravenous contrast limits the ability to evaluate
solid abdominal organs.

Normal hepatic contour. There is a large (approximately 1.5 x
cm stone within the neck of a distended gallbladder.  There is a
minimal amount of apparent gallbladder wall thickening (measuring
approximately 7 mm in diameter (image 41, series 2).  These
findings are also associated with a minimal amount of apparent
pericholecystic fluid on this noncontrast examination.  The common
bile duct is dilated measuring approximately 9 mm in short axis
diameter (image 35, series 2).  No definite intrahepatic biliary
ductal dilatation on this noncontrast examination. There is a trace
amount of fluid adjacent to the inferior aspect of the right lobe
of the liver.

Normal noncontrast appearance of the bilateral kidneys.  Possible
vascular calcification within the right renal hilum.  No definite
renal stones or evidence of urinary obstruction.  No definite
perinephric stranding.  Normal noncontrast appearance of the
bilateral adrenal glands, pancreas and spleen.

Large hiatal hernia as described in the preceding chest CT.  The
rectum and distal sigmoid colon is mildly patulous.  The bowel is
otherwise normal in course and caliber without wall thickening or
evidence of obstruction.  No pneumoperitoneum, pneumatosis or
portal venous gas.

Moderate amount of calcified atherosclerotic plaque within a mildly
tortuous and normal caliber abdominal aorta.

Right external iliac pelvic sidewall lymphadenopathy has increased
in the interval with index no conglomeration now measuring
approximately 4.6 x 2.4 cm (image 65, series 2, previously, 4.3 x
2.3 cm.  Interval development of both the right inguinal
lymphadenopathy with index no conglomeration now measuring
approximately 4.1 x 2.5 cm.  No definite retroperitoneal or
mesenteric adenopathy on this noncontrast examination.

Post hysterectomy.  No discrete adnexal lesion.  No free fluid in
the pelvis.
IMPRESSION: 1.  Cholelithiasis with findings worrisome for acute cholecystitis.
Further evaluation with a HIDA scan may be performed as clinically
indicated.

2. Interval progression of right external iliac and inguinal
lymphadenopathy again worrisome for malignancy.  If not previously
performed, percutaneous sampling may be obtained as indicated.

Above findings discussed with Dr. Agamenon at [phone].

## 2014-02-12 ENCOUNTER — Telehealth: Payer: Self-pay | Admitting: *Deleted

## 2014-02-12 NOTE — Telephone Encounter (Signed)
Received call from Mariel Kansky, RN @ [facility] wanting to know re:  Would Dr. Burr Medico be the attending for pt with hospice, since Dr. Lona Kettle is no longer at the cancer center  Message to Dr. Burr Medico for review.

## 2014-02-13 ENCOUNTER — Telehealth: Payer: Self-pay | Admitting: *Deleted

## 2014-02-13 NOTE — Telephone Encounter (Signed)
Left message on Jan Parker RN @ [facility] @ [phone] to have Hospice MD be attending for this pt since she has never seen her before.

## 2014-02-14 ENCOUNTER — Telehealth: Payer: Self-pay | Admitting: *Deleted

## 2014-02-14 NOTE — Telephone Encounter (Signed)
Received return call from Mariel Kansky yest stating that Hospice MD's do not take primary patients.  Checked with Dr Burr Medico & she asked if Dr Pablo Ledger would take pt on.  Called & spoke with Lavonda Jumbo & she states Radiation MD's don't do this b/c they don't admit.  Informed Dr. Burr Medico & she agreed to take pt.  Called Mariel Kansky & this info was given.  She states that Dr Christy Gentles agreed to take on pt but they will leave it up to the pt & get back with [place].

## 2014-06-09 DEATH — deceased

## 2016-08-10 ENCOUNTER — Other Ambulatory Visit: Payer: Self-pay | Admitting: Nurse Practitioner

## 2016-10-09 ENCOUNTER — Other Ambulatory Visit: Payer: Self-pay | Admitting: Nurse Practitioner
